# Patient Record
Sex: Male | Born: 1965 | Race: Black or African American | Hispanic: No | Marital: Single | Smoking: Current every day smoker
Health system: Southern US, Community
[De-identification: ages and names within clinical notes are randomized; demographics above are authoritative.]

## PROBLEM LIST (undated history)

## (undated) DIAGNOSIS — K219 Gastro-esophageal reflux disease without esophagitis: Secondary | ICD-10-CM

## (undated) DIAGNOSIS — M545 Low back pain, unspecified: Secondary | ICD-10-CM

## (undated) DIAGNOSIS — F17200 Nicotine dependence, unspecified, uncomplicated: Secondary | ICD-10-CM

## (undated) DIAGNOSIS — F419 Anxiety disorder, unspecified: Secondary | ICD-10-CM

## (undated) DIAGNOSIS — F1010 Alcohol abuse, uncomplicated: Secondary | ICD-10-CM

## (undated) DIAGNOSIS — F319 Bipolar disorder, unspecified: Secondary | ICD-10-CM

## (undated) DIAGNOSIS — Z9289 Personal history of other medical treatment: Secondary | ICD-10-CM

## (undated) DIAGNOSIS — E785 Hyperlipidemia, unspecified: Secondary | ICD-10-CM

## (undated) DIAGNOSIS — I739 Peripheral vascular disease, unspecified: Secondary | ICD-10-CM

## (undated) DIAGNOSIS — F329 Major depressive disorder, single episode, unspecified: Secondary | ICD-10-CM

## (undated) DIAGNOSIS — G8929 Other chronic pain: Secondary | ICD-10-CM

## (undated) DIAGNOSIS — E119 Type 2 diabetes mellitus without complications: Secondary | ICD-10-CM

## (undated) DIAGNOSIS — M359 Systemic involvement of connective tissue, unspecified: Secondary | ICD-10-CM

## (undated) DIAGNOSIS — I10 Essential (primary) hypertension: Secondary | ICD-10-CM

## (undated) DIAGNOSIS — F32A Depression, unspecified: Secondary | ICD-10-CM

## (undated) DIAGNOSIS — I639 Cerebral infarction, unspecified: Secondary | ICD-10-CM

## (undated) HISTORY — DX: Peripheral vascular disease, unspecified: I73.9

## (undated) HISTORY — DX: Hyperlipidemia, unspecified: E78.5

## (undated) HISTORY — PX: TONSILLECTOMY: SUR1361

---
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
## 2011-08-11 DIAGNOSIS — I639 Cerebral infarction, unspecified: Secondary | ICD-10-CM

## 2011-08-11 HISTORY — DX: Cerebral infarction, unspecified: I63.9

## 2011-08-15 ENCOUNTER — Other Ambulatory Visit (HOSPITAL_COMMUNITY): Payer: Self-pay | Admitting: Internal Medicine

## 2011-08-15 ENCOUNTER — Ambulatory Visit (HOSPITAL_COMMUNITY)
Admission: RE | Admit: 2011-08-15 | Discharge: 2011-08-15 | Disposition: A | Discharge status: Home / Self Care | Payer: Self-pay | Source: Ambulatory Visit | Attending: Internal Medicine | Admitting: Internal Medicine

## 2011-08-15 ENCOUNTER — Encounter (HOSPITAL_COMMUNITY): Payer: Self-pay

## 2011-08-15 DIAGNOSIS — I7771 Dissection of carotid artery: Secondary | ICD-10-CM

## 2011-08-15 DIAGNOSIS — R9409 Abnormal results of other function studies of central nervous system: Secondary | ICD-10-CM | POA: Insufficient documentation

## 2011-08-15 DIAGNOSIS — R4701 Aphasia: Secondary | ICD-10-CM | POA: Insufficient documentation

## 2011-08-15 DIAGNOSIS — I10 Essential (primary) hypertension: Secondary | ICD-10-CM | POA: Insufficient documentation

## 2011-08-15 DIAGNOSIS — R29810 Facial weakness: Secondary | ICD-10-CM | POA: Insufficient documentation

## 2011-08-15 DIAGNOSIS — Z8673 Personal history of transient ischemic attack (TIA), and cerebral infarction without residual deficits: Secondary | ICD-10-CM | POA: Insufficient documentation

## 2011-08-15 DIAGNOSIS — R29898 Other symptoms and signs involving the musculoskeletal system: Secondary | ICD-10-CM | POA: Insufficient documentation

## 2011-08-15 DIAGNOSIS — I708 Atherosclerosis of other arteries: Secondary | ICD-10-CM | POA: Insufficient documentation

## 2011-08-15 HISTORY — DX: Essential (primary) hypertension: I10

## 2011-08-15 HISTORY — DX: Cerebral infarction, unspecified: I63.9

## 2011-08-15 MED ORDER — HYDRALAZINE HCL 20 MG/ML IJ SOLN
5.0000 mg | Freq: Once | INTRAMUSCULAR | Status: AC
  Administered 2011-08-15: 5 mg via INTRAVENOUS

## 2011-08-15 MED ORDER — FENTANYL CITRATE 0.05 MG/ML IJ SOLN
INTRAMUSCULAR | Status: AC
  Filled 2011-08-15: qty 4

## 2011-08-15 MED ORDER — FENTANYL CITRATE 0.05 MG/ML IJ SOLN
INTRAMUSCULAR | Status: AC | PRN
  Administered 2011-08-15 (×2): 25 ug via INTRAVENOUS

## 2011-08-15 MED ORDER — MIDAZOLAM HCL 2 MG/2ML IJ SOLN
INTRAMUSCULAR | Status: AC
  Filled 2011-08-15: qty 4

## 2011-08-15 MED ORDER — HYDRALAZINE HCL 20 MG/ML IJ SOLN: 5.0000 mg | Freq: Once | INTRAMUSCULAR | Status: DC

## 2011-08-15 MED ORDER — MIDAZOLAM HCL 5 MG/5ML IJ SOLN
INTRAMUSCULAR | Status: AC | PRN
  Administered 2011-08-15: 1 mg via INTRAVENOUS

## 2011-08-15 MED ORDER — HEPARIN SOD (PORK) LOCK FLUSH 100 UNIT/ML IV SOLN
INTRAVENOUS | Status: AC | PRN
  Administered 2011-08-15 (×2): 500 [IU] via INTRAVENOUS

## 2011-08-15 MED ORDER — IOHEXOL 300 MG/ML  SOLN
150.0000 mL | Freq: Once | INTRAMUSCULAR | Status: AC | PRN
  Administered 2011-08-15: 25 mL via INTRA_ARTERIAL

## 2011-08-15 MED ORDER — FLUMAZENIL 0.5 MG/5ML IV SOLN
INTRAVENOUS | Status: AC
  Filled 2011-08-15: qty 5

## 2011-08-15 MED ORDER — HYDRALAZINE HCL 20 MG/ML IJ SOLN
INTRAMUSCULAR | Status: AC | PRN
  Administered 2011-08-15 (×3): 5 mg via INTRAVENOUS

## 2011-08-15 MED ORDER — HYDRALAZINE HCL 20 MG/ML IJ SOLN
INTRAMUSCULAR | Status: AC
  Filled 2011-08-15: qty 1

## 2011-08-15 NOTE — H&P (Signed)
[name] [name] is an 46 y.o. male.   Chief Complaint: Rt arm weakness; rt facial droop; abn MR  HPI: scheduled for Cerebral Arteriogram  Past Medical History  Diagnosis Date  . Hypertension   . Stroke     No past surgical history on file.  No family history on file. Social History:  does not have a smoking history on file. He does not have any smokeless tobacco history on file. His alcohol and drug histories not on file.  Allergies: No Known Allergies  No current outpatient prescriptions on file as of 08/15/2011.   No current facility-administered medications on file as of 08/15/2011.    No results found for this or any previous visit (from the past 48 hour(s)). No results found.  Review of Systems  Constitutional: Negative for fever.  Respiratory: Negative for cough.   Cardiovascular: Negative for chest pain.  Gastrointestinal: Negative for nausea, vomiting and abdominal pain.  Neurological: Positive for focal weakness. Negative for headaches.       Rt arm weakness; rt facial droop    Blood pressure 160/75, pulse 74, height 6\' 2"  (1.88 m), weight 220 lb (99.791 kg), SpO2 99.00%. Physical Exam  Constitutional: He is oriented to person, place, and time. He appears well-developed and well-nourished.  HENT:  Head: Normocephalic.  Eyes: EOM are normal.       Rt facial droop  Neck: Normal range of motion.  Cardiovascular: Normal rate, regular rhythm and normal heart sounds.   No murmur heard. Respiratory: Effort normal. He has no wheezes.  GI: Soft. Bowel sounds are normal. There is no tenderness.  Musculoskeletal: Normal range of motion.  Neurological: He is alert and oriented to person, place, and time. No cranial nerve deficit. Coordination abnormal.       Rt arm weakness  Skin: Skin is warm and dry.     Assessment/Plan Rt arm weak; facial droop; abn MR Scheduled now for cerebral arteriogram Pt aware of procedure benefits and risks and agreeable to proceed. Consent  signed.  Marrissa Dai A 08/15/2011, 11:19 AM

## 2011-08-15 NOTE — ED Notes (Signed)
Patients Groin checked q50min during recovery period.

## 2011-08-18 ENCOUNTER — Other Ambulatory Visit (HOSPITAL_COMMUNITY): Payer: Self-pay | Admitting: Internal Medicine

## 2011-08-18 DIAGNOSIS — I7771 Dissection of carotid artery: Secondary | ICD-10-CM

## 2011-08-19 ENCOUNTER — Other Ambulatory Visit (HOSPITAL_COMMUNITY): Payer: Self-pay | Admitting: Internal Medicine

## 2011-08-19 ENCOUNTER — Inpatient Hospital Stay (HOSPITAL_COMMUNITY): Admission: AD | Admit: 2011-08-19 | Payer: Self-pay | Source: Ambulatory Visit | Admitting: Vascular Surgery

## 2011-08-19 ENCOUNTER — Encounter (HOSPITAL_COMMUNITY): Payer: Self-pay

## 2011-08-19 ENCOUNTER — Inpatient Hospital Stay (HOSPITAL_COMMUNITY)
Admission: RE | Admit: 2011-08-19 | Discharge: 2011-08-23 | DRG: 038 | Disposition: A | Discharge status: Home / Self Care | Payer: Self-pay | Source: Ambulatory Visit | Attending: Vascular Surgery | Admitting: Vascular Surgery

## 2011-08-19 ENCOUNTER — Telehealth (HOSPITAL_COMMUNITY): Payer: Self-pay

## 2011-08-19 ENCOUNTER — Encounter (HOSPITAL_COMMUNITY): Payer: Self-pay | Admitting: Pharmacy Technician

## 2011-08-19 ENCOUNTER — Ambulatory Visit (HOSPITAL_COMMUNITY): Payer: Self-pay

## 2011-08-19 DIAGNOSIS — I7771 Dissection of carotid artery: Secondary | ICD-10-CM

## 2011-08-19 DIAGNOSIS — I10 Essential (primary) hypertension: Secondary | ICD-10-CM | POA: Diagnosis present

## 2011-08-19 DIAGNOSIS — R4701 Aphasia: Secondary | ICD-10-CM | POA: Diagnosis present

## 2011-08-19 DIAGNOSIS — Z7982 Long term (current) use of aspirin: Secondary | ICD-10-CM

## 2011-08-19 DIAGNOSIS — K219 Gastro-esophageal reflux disease without esophagitis: Secondary | ICD-10-CM | POA: Diagnosis present

## 2011-08-19 DIAGNOSIS — I6529 Occlusion and stenosis of unspecified carotid artery: Secondary | ICD-10-CM

## 2011-08-19 DIAGNOSIS — I7409 Other arterial embolism and thrombosis of abdominal aorta: Secondary | ICD-10-CM | POA: Diagnosis present

## 2011-08-19 DIAGNOSIS — Z8673 Personal history of transient ischemic attack (TIA), and cerebral infarction without residual deficits: Secondary | ICD-10-CM

## 2011-08-19 DIAGNOSIS — Z79899 Other long term (current) drug therapy: Secondary | ICD-10-CM

## 2011-08-19 DIAGNOSIS — F17200 Nicotine dependence, unspecified, uncomplicated: Secondary | ICD-10-CM | POA: Diagnosis present

## 2011-08-19 DIAGNOSIS — Z23 Encounter for immunization: Secondary | ICD-10-CM

## 2011-08-19 DIAGNOSIS — F1010 Alcohol abuse, uncomplicated: Secondary | ICD-10-CM | POA: Diagnosis present

## 2011-08-19 DIAGNOSIS — F411 Generalized anxiety disorder: Secondary | ICD-10-CM | POA: Diagnosis present

## 2011-08-19 HISTORY — DX: Gastro-esophageal reflux disease without esophagitis: K21.9

## 2011-08-19 HISTORY — DX: Anxiety disorder, unspecified: F41.9

## 2011-08-19 HISTORY — DX: Nicotine dependence, unspecified, uncomplicated: F17.200

## 2011-08-19 HISTORY — DX: Alcohol abuse, uncomplicated: F10.10

## 2011-08-19 LAB — COMPREHENSIVE METABOLIC PANEL
ALT: 89 U/L — ABNORMAL HIGH (ref 0–53)
Albumin: 3.8 g/dL (ref 3.5–5.2)
Calcium: 9.4 mg/dL (ref 8.4–10.5)
GFR calc Af Amer: >90 mL/min (ref >90)
Glucose, Bld: 160 mg/dL — ABNORMAL HIGH (ref 70–99)
Sodium: 137 mEq/L (ref 135–145)
Total Protein: 7.5 g/dL (ref 6.0–8.3)

## 2011-08-19 LAB — URINALYSIS, ROUTINE W REFLEX MICROSCOPIC
Bilirubin Urine: NEGATIVE
Ketones, ur: NEGATIVE mg/dL
Leukocytes, UA: NEGATIVE
Nitrite: NEGATIVE
Specific Gravity, Urine: 1.01 (ref 1.005–1.030)
Urobilinogen, UA: 0.2 mg/dL (ref 0.0–1.0)

## 2011-08-19 LAB — CBC
Hemoglobin: 14.4 g/dL (ref 13.0–17.0)
MCH: 29.1 pg (ref 26.0–34.0)
MCHC: 33.5 g/dL (ref 30.0–36.0)
RDW: 13.8 % (ref 11.5–15.5)

## 2011-08-19 LAB — PROTIME-INR
INR: 0.99 (ref 0.00–1.49)
Prothrombin Time: 13.3 seconds (ref 11.6–15.2)

## 2011-08-19 LAB — AMYLASE: Amylase: 51 U/L (ref 0–105)

## 2011-08-19 MED ORDER — HEPARIN SOD (PORCINE) IN D5W 100 UNIT/ML IV SOLN
1500.0000 [IU]/h | INTRAVENOUS | Status: DC
  Administered 2011-08-19: 1500 [IU]/h via INTRAVENOUS
  Filled 2011-08-19 (×3): qty 250

## 2011-08-19 MED ORDER — FENTANYL CITRATE 0.05 MG/ML IJ SOLN
INTRAMUSCULAR | Status: DC | PRN
  Administered 2011-08-19 (×4): 25 ug via INTRAVENOUS

## 2011-08-19 MED ORDER — LABETALOL HCL 5 MG/ML IV SOLN
10.0000 mg | INTRAVENOUS | Status: DC | PRN
  Administered 2011-08-20: 10 mg via INTRAVENOUS
  Filled 2011-08-19: qty 4

## 2011-08-19 MED ORDER — HYDRALAZINE HCL 20 MG/ML IJ SOLN
INTRAMUSCULAR | Status: AC
  Filled 2011-08-19: qty 1

## 2011-08-19 MED ORDER — GUAIFENESIN-DM 100-10 MG/5ML PO SYRP: 15.0000 mL | ORAL_SOLUTION | ORAL | Status: DC | PRN

## 2011-08-19 MED ORDER — METOPROLOL TARTRATE 1 MG/ML IV SOLN: 2.0000 mg | INTRAVENOUS | Status: DC | PRN

## 2011-08-19 MED ORDER — MIDAZOLAM HCL 2 MG/2ML IJ SOLN
INTRAMUSCULAR | Status: AC
  Filled 2011-08-19: qty 6

## 2011-08-19 MED ORDER — MIDAZOLAM HCL 5 MG/5ML IJ SOLN
INTRAMUSCULAR | Status: DC | PRN
  Administered 2011-08-19 (×2): 1 mg via INTRAVENOUS

## 2011-08-19 MED ORDER — HYDRALAZINE HCL 20 MG/ML IJ SOLN
10.0000 mg | INTRAMUSCULAR | Status: DC | PRN
  Filled 2011-08-19: qty 0.5

## 2011-08-19 MED ORDER — HEPARIN SOD (PORK) LOCK FLUSH 100 UNIT/ML IV SOLN
500.0000 [IU] | Freq: Once | INTRAVENOUS | Status: AC
  Administered 2011-08-19: 500 [IU] via INTRAVENOUS

## 2011-08-19 MED ORDER — TEMAZEPAM 15 MG PO CAPS
15.0000 mg | ORAL_CAPSULE | Freq: Every evening | ORAL | Status: DC | PRN
  Administered 2011-08-19: 15 mg via ORAL
  Filled 2011-08-19: qty 1

## 2011-08-19 MED ORDER — ACETAMINOPHEN 325 MG PO TABS: 325.0000 mg | ORAL_TABLET | ORAL | Status: DC | PRN

## 2011-08-19 MED ORDER — FAMOTIDINE IN NACL 20-0.9 MG/50ML-% IV SOLN
20.0000 mg | Freq: Two times a day (BID) | INTRAVENOUS | Status: DC
  Administered 2011-08-19 – 2011-08-20 (×2): 20 mg via INTRAVENOUS
  Filled 2011-08-19 (×3): qty 50

## 2011-08-19 MED ORDER — DEXTROSE 5 % IV SOLN
1.5000 g | INTRAVENOUS | Status: AC
  Administered 2011-08-20: 1.5 g via INTRAVENOUS
  Filled 2011-08-19: qty 1.5

## 2011-08-19 MED ORDER — DOCUSATE SODIUM 100 MG PO CAPS
100.0000 mg | ORAL_CAPSULE | Freq: Two times a day (BID) | ORAL | Status: DC
  Filled 2011-08-19 (×2): qty 1

## 2011-08-19 MED ORDER — IOHEXOL 300 MG/ML  SOLN
250.0000 mL | Freq: Once | INTRAMUSCULAR | Status: AC | PRN
  Administered 2011-08-19: 120 mL via INTRA_ARTERIAL

## 2011-08-19 MED ORDER — SENNOSIDES-DOCUSATE SODIUM 8.6-50 MG PO TABS
1.0000 | ORAL_TABLET | Freq: Every evening | ORAL | Status: DC | PRN
  Filled 2011-08-19: qty 1

## 2011-08-19 MED ORDER — INFLUENZA VIRUS VACC SPLIT PF IM SUSP
0.5000 mL | INTRAMUSCULAR | Status: AC
Start: 2011-08-20 — End: 2011-08-21
  Filled 2011-08-19: qty 0.5

## 2011-08-19 MED ORDER — HYDRALAZINE HCL 20 MG/ML IJ SOLN
5.0000 mg | INTRAMUSCULAR | Status: DC
  Administered 2011-08-19: 12:00:00 via INTRAVENOUS
  Administered 2011-08-19: 5 mg via INTRAVENOUS
  Administered 2011-08-19: 12:00:00 via INTRAVENOUS

## 2011-08-19 MED ORDER — POTASSIUM CHLORIDE CRYS ER 20 MEQ PO TBCR: 20.0000 meq | EXTENDED_RELEASE_TABLET | Freq: Once | ORAL | Status: DC

## 2011-08-19 MED ORDER — OXYCODONE HCL 5 MG PO TABS
5.0000 mg | ORAL_TABLET | ORAL | Status: DC | PRN
  Administered 2011-08-19 (×2): 10 mg via ORAL
  Filled 2011-08-19 (×2): qty 2

## 2011-08-19 MED ORDER — MORPHINE SULFATE 2 MG/ML IJ SOLN
2.0000 mg | INTRAMUSCULAR | Status: DC | PRN
  Administered 2011-08-19: 2 mg via INTRAVENOUS
  Filled 2011-08-19 (×2): qty 1

## 2011-08-19 MED ORDER — ACETAMINOPHEN 650 MG RE SUPP: 325.0000 mg | RECTAL | Status: DC | PRN

## 2011-08-19 MED ORDER — PHENOL 1.4 % MT LIQD
1.0000 | OROMUCOSAL | Status: DC | PRN
  Filled 2011-08-19: qty 177

## 2011-08-19 MED ORDER — ONDANSETRON HCL 4 MG/2ML IJ SOLN: 4.0000 mg | Freq: Four times a day (QID) | INTRAMUSCULAR | Status: DC | PRN

## 2011-08-19 MED ORDER — SENNA 8.6 MG PO TABS
1.0000 | ORAL_TABLET | Freq: Two times a day (BID) | ORAL | Status: DC
  Filled 2011-08-19 (×3): qty 1

## 2011-08-19 MED ORDER — CEFAZOLIN SODIUM 1-5 GM-% IV SOLN
INTRAVENOUS | Status: AC
  Filled 2011-08-19: qty 50

## 2011-08-19 MED ORDER — FENTANYL CITRATE 0.05 MG/ML IJ SOLN
INTRAMUSCULAR | Status: AC
  Filled 2011-08-19: qty 6

## 2011-08-19 MED ORDER — SODIUM CHLORIDE 0.9 % IV SOLN: INTRAVENOUS | Status: AC

## 2011-08-19 MED ORDER — HYDRALAZINE HCL 20 MG/ML IJ SOLN
INTRAMUSCULAR | Status: AC
  Administered 2011-08-19: 5 mg via INTRAVENOUS
  Filled 2011-08-19: qty 1

## 2011-08-19 NOTE — ED Notes (Signed)
Transferred to radiology nurses station for evaluation for 3 hrs prior to return transport to [facility].  Pt & family informed of above

## 2011-08-19 NOTE — Procedures (Signed)
S/P 4 vessel cerebral arteriogram, and abdominal aortogram Rt CFAa pproach Preliminary findings  1. 95 % lt ICA prox stenosis. 2.70 % RT VA origin stenosis. 3. 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

## 2011-08-19 NOTE — Plan of Care (Signed)
Problem: Consults Goal: Diagnosis CEA/CES/AAA Stent Outcome: Completed/Met Date Met:  08/19/11 CEA
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
## 2011-08-19 NOTE — ED Notes (Signed)
Family updated as to patient's status per MD 

## 2011-08-19 NOTE — H&P (Signed)
[name] [name] is an 46 y.o. male.   Chief Complaint: CVA 2/11; rt facial droop; slurred speech; rt arm weak resolving HPI: Scheduled for cerebral arteriogram today in i[place]  Past Medical History  Diagnosis Date  . Hypertension   . Stroke     No past surgical history on file.  No family history on file. Social History:  does not have a smoking history on file. He does not have any smokeless tobacco history on file. His alcohol and drug histories not on file.  Allergies: No Known Allergies  Medications Prior to Admission  Medication Sig Dispense Refill  . Azilsartan Medoxomil (EDARBI) 80 MG TABS Take by mouth once. Has not taken any home medications since hospitalized at [facility]      . loratadine (CLARITIN) 10 MG tablet Take 10 mg by mouth daily.       No current facility-administered medications on file as of 08/19/2011.    No results found for this or any previous visit (from the past 48 hour(s)). No results found.  Review of Systems  Constitutional: Negative for fever.  Eyes: Negative for blurred vision.  Respiratory: Negative for cough.   Cardiovascular: Negative for chest pain.  Gastrointestinal: Negative for nausea, vomiting and abdominal pain.  Neurological: Positive for tingling, speech change and focal weakness. Negative for headaches.       Rt sided sxs    Blood pressure 149/84, pulse 68, temperature 97.8 F (36.6 C), resp. rate 20, SpO2 98.00%. Physical Exam  Constitutional: He is oriented to person, place, and time. He appears well-developed and well-nourished.  HENT:  Head: Normocephalic.  Eyes: EOM are normal.  Neck: Normal range of motion.  Cardiovascular: Normal rate, regular rhythm and normal heart sounds.   No murmur heard. Respiratory: Effort normal and breath sounds normal. He has no wheezes.  GI: Soft. Bowel sounds are normal. There is no tenderness.  Musculoskeletal: Normal range of motion.  Neurological: He is alert and oriented to person, place,  and time. Coordination abnormal.  Skin: Skin is warm and dry.     Assessment/Plan CVA 2/11; rt sided weakness; facial droop; abn MRI Scheduled for cerebral arteriogram Aware of procedure benefits and risks and agreeable to proceed. Consent signed.  [name] [name] A 08/19/2011, 10:25 AM

## 2011-08-19 NOTE — ED Notes (Signed)
Pt rec'd from [place] [facility] with 22 g angiocath in left hand.  Flushed with ease and denies discomfort.

## 2011-08-19 NOTE — Consult Note (Signed)
Pt with left hemispheric stroke approximately one week ago.  Initially had aphasia, with right upper and lower extremity weakness.  These have all improved somewhat over the last few days.  He still has right hand clumsiness and some slurred speech.   No prior stroke.  Has been on aspirin several days.  Agram today shows high grade LICA stenosis as well as occluded infrarenal aorta.  No dissection.  Risk factors include HTN, tobacco abuse.  The patient has no claudication symptoms despite aortic occlusion.  Head CT at [facility] showed parietal infarct with no hemorrhage.  Other history details as above.  Neuro exam: LUE LLE 5/5 motor, RLE 5/5 motor, R hand wrist 4-5 over 5, Right upper arm 2-3/5 Absent femoral and pedal pulses  Otherwise exam as above.   Assessment: high grade Left ICA stenosis symptomatic.  No significant right ICA stenosis    .I discussed with the patient the risks of carotid endarterectomy including but not limited to myocardial events 5%, cranial nerve injury 10-15%, stroke 1-2%, bleeding or infection 1%.  I also discussed the possibility of ischemic penumbra post CEA as well as risk of intracranial hemorrhage.  I also discussed the benefits of long term stroke prevention and the advantage compared to medical therapy The patient is aware of the risks and agrees to proceed forward with the procedure.  As far as his aortic occlusion is concerned this is currently asymptomatic and can be managed expectantly for now.  Left CEA in AM Heparin drip overnight.  Fabienne Bruns, MD [facility] [place] Office: [phone] Pager: [phone]

## 2011-08-19 NOTE — Progress Notes (Signed)
ANTICOAGULATION CONSULT NOTE - Initial Consult  Pharmacy Consult for Heparin Indication: carotid stenosis  No Known Allergies  Patient Measurements:   Heparin Dosing Weight: 218LBS total weight  Vital Signs: Temp: 97.8 F (36.6 C) (02/19 1000) BP: 163/83 mmHg (02/19 1620) Pulse Rate: 67  (02/19 1620)  Labs: No results found for this basename: HGB:2,HCT:3,PLT:3,APTT:3,LABPROT:3,INR:3,HEPARINUNFRC:3,CREATININE:3,CKTOTAL:3,CKMB:3,TROPONINI:3 in the last 72 hours CrCl is unknown because no creatinine reading has been taken.  Medical History: Past Medical History  Diagnosis Date  . Hypertension   . Stroke   . GERD (gastroesophageal reflux disease)   . Nicotine addiction   . ETOH abuse     Medications:  Prescriptions prior to admission  Medication Sig Dispense Refill  . loratadine (CLARITIN) 10 MG tablet Take 10 mg by mouth daily.      . Azilsartan Medoxomil (EDARBI) 80 MG TABS Take by mouth once. Has not taken any home medications since hospitalized at [facility]        Assessment: 46 yo man s/p CVA and Cerebral arteriogram to start heparin for carotid stenosis.  Plan L CEA tomorrow Goal of Therapy:  Heparin level 0.3-0.5 units/ml   Plan:  Heparin to start without bolus at 1500 Units/hr.  Will check heparin level and CBC 8 hours after start.  Talbert Cage Poteet 08/19/2011,4:34 PM

## 2011-08-19 NOTE — ED Notes (Signed)
MD at bedside. 

## 2011-08-19 NOTE — ED Notes (Signed)
Radial and brachial pulses to left are 3+, easily palpated.

## 2011-08-19 NOTE — Progress Notes (Signed)
Pt requested medication for anxiety. Called Dr. Darrick Penna he said no additional meds at this time other than what is already ordered for sleep. Otilio Connors RN

## 2011-08-19 NOTE — ED Notes (Signed)
Pt voided 350 ML clear yellow urine

## 2011-08-20 ENCOUNTER — Encounter (HOSPITAL_COMMUNITY): Payer: Self-pay | Admitting: Anesthesiology

## 2011-08-20 ENCOUNTER — Other Ambulatory Visit: Payer: Self-pay

## 2011-08-20 ENCOUNTER — Other Ambulatory Visit: Payer: Self-pay | Admitting: Vascular Surgery

## 2011-08-20 ENCOUNTER — Encounter (HOSPITAL_COMMUNITY): Payer: Self-pay

## 2011-08-20 ENCOUNTER — Telehealth (HOSPITAL_COMMUNITY): Payer: Self-pay | Admitting: Interventional Radiology

## 2011-08-20 ENCOUNTER — Encounter (HOSPITAL_COMMUNITY)
Admission: RE | Disposition: A | Discharge status: Home / Self Care | Payer: Self-pay | Source: Ambulatory Visit | Attending: Vascular Surgery

## 2011-08-20 ENCOUNTER — Ambulatory Visit (HOSPITAL_COMMUNITY): Payer: Self-pay | Admitting: Anesthesiology

## 2011-08-20 HISTORY — PX: ENDARTERECTOMY: SHX5162

## 2011-08-20 LAB — CBC
HCT: 35.8 % — ABNORMAL LOW (ref 39.0–52.0)
Hemoglobin: 11.7 g/dL — ABNORMAL LOW (ref 13.0–17.0)
MCH: 28.5 pg (ref 26.0–34.0)
MCV: 87.1 fL (ref 78.0–100.0)
RBC: 4.11 MIL/uL — ABNORMAL LOW (ref 4.22–5.81)

## 2011-08-20 LAB — SURGICAL PCR SCREEN: Staphylococcus aureus: NEGATIVE

## 2011-08-20 SURGERY — ENDARTERECTOMY, CAROTID
Anesthesia: General | Site: Neck | Laterality: Left | Wound class: Clean

## 2011-08-20 MED ORDER — MORPHINE SULFATE 2 MG/ML IJ SOLN
2.0000 mg | INTRAMUSCULAR | Status: DC | PRN
  Administered 2011-08-20: 4 mg via INTRAVENOUS
  Administered 2011-08-20 (×2): 2 mg via INTRAVENOUS
  Administered 2011-08-20 – 2011-08-21 (×2): 4 mg via INTRAVENOUS
  Filled 2011-08-20 (×3): qty 2
  Filled 2011-08-20 (×2): qty 1

## 2011-08-20 MED ORDER — OXYCODONE HCL 5 MG PO TABS: 5.0000 mg | ORAL_TABLET | ORAL | Status: DC | PRN

## 2011-08-20 MED ORDER — 0.9 % SODIUM CHLORIDE (POUR BTL) OPTIME
TOPICAL | Status: DC | PRN
  Administered 2011-08-20: 1000 mL

## 2011-08-20 MED ORDER — NITROGLYCERIN IN D5W 200-5 MCG/ML-% IV SOLN
INTRAVENOUS | Status: DC | PRN
  Administered 2011-08-20: 25 ug/min via INTRAVENOUS

## 2011-08-20 MED ORDER — POTASSIUM CHLORIDE CRYS ER 20 MEQ PO TBCR: 20.0000 meq | EXTENDED_RELEASE_TABLET | Freq: Once | ORAL | Status: AC | PRN

## 2011-08-20 MED ORDER — ONDANSETRON HCL 4 MG/2ML IJ SOLN: 4.0000 mg | Freq: Four times a day (QID) | INTRAMUSCULAR | Status: DC | PRN

## 2011-08-20 MED ORDER — HEPARIN SODIUM (PORCINE) 5000 UNIT/ML IJ SOLN
5000.0000 [IU] | Freq: Three times a day (TID) | INTRAMUSCULAR | Status: DC
  Administered 2011-08-21 – 2011-08-23 (×6): 5000 [IU] via SUBCUTANEOUS
  Filled 2011-08-20 (×11): qty 1

## 2011-08-20 MED ORDER — LACTATED RINGERS IV SOLN
INTRAVENOUS | Status: DC | PRN
  Administered 2011-08-20 (×2): via INTRAVENOUS

## 2011-08-20 MED ORDER — POTASSIUM CHLORIDE IN NACL 20-0.9 MEQ/L-% IV SOLN
INTRAVENOUS | Status: DC
  Administered 2011-08-20: 19:00:00 via INTRAVENOUS
  Filled 2011-08-20 (×3): qty 1000

## 2011-08-20 MED ORDER — GLYCOPYRROLATE 0.2 MG/ML IJ SOLN
INTRAMUSCULAR | Status: DC | PRN
  Administered 2011-08-20: 0.2 mg via INTRAVENOUS
  Administered 2011-08-20: .6 mg via INTRAVENOUS

## 2011-08-20 MED ORDER — LABETALOL HCL 5 MG/ML IV SOLN
10.0000 mg | INTRAVENOUS | Status: DC | PRN
  Administered 2011-08-20 – 2011-08-22 (×4): 10 mg via INTRAVENOUS
  Filled 2011-08-20: qty 4

## 2011-08-20 MED ORDER — ONDANSETRON HCL 4 MG/2ML IJ SOLN: 4.0000 mg | Freq: Once | INTRAMUSCULAR | Status: DC | PRN

## 2011-08-20 MED ORDER — DOPAMINE-DEXTROSE 3.2-5 MG/ML-% IV SOLN: 3.0000 ug/kg/min | INTRAVENOUS | Status: DC

## 2011-08-20 MED ORDER — ASPIRIN EC 325 MG PO TBEC
325.0000 mg | DELAYED_RELEASE_TABLET | Freq: Every day | ORAL | Status: DC
  Administered 2011-08-20 – 2011-08-23 (×4): 325 mg via ORAL
  Filled 2011-08-20 (×4): qty 1

## 2011-08-20 MED ORDER — HYDRALAZINE HCL 20 MG/ML IJ SOLN
10.0000 mg | INTRAMUSCULAR | Status: DC | PRN
  Administered 2011-08-21: 15:00:00 via INTRAVENOUS
  Filled 2011-08-20 (×2): qty 0.5

## 2011-08-20 MED ORDER — ARTIFICIAL TEARS OP OINT
TOPICAL_OINTMENT | OPHTHALMIC | Status: DC | PRN
  Administered 2011-08-20: 1 via OPHTHALMIC

## 2011-08-20 MED ORDER — FAMOTIDINE IN NACL 20-0.9 MG/50ML-% IV SOLN
20.0000 mg | Freq: Two times a day (BID) | INTRAVENOUS | Status: DC
  Administered 2011-08-20 – 2011-08-21 (×2): 20 mg via INTRAVENOUS
  Filled 2011-08-20 (×4): qty 50

## 2011-08-20 MED ORDER — SUCCINYLCHOLINE CHLORIDE 20 MG/ML IJ SOLN
INTRAMUSCULAR | Status: DC | PRN
  Administered 2011-08-20: 20 mg via INTRAVENOUS

## 2011-08-20 MED ORDER — PHENYLEPHRINE HCL 10 MG/ML IJ SOLN
20.0000 mg | INTRAMUSCULAR | Status: DC | PRN
  Administered 2011-08-20: 20 ug/min via INTRAVENOUS

## 2011-08-20 MED ORDER — FENTANYL CITRATE 0.05 MG/ML IJ SOLN
INTRAMUSCULAR | Status: DC | PRN
  Administered 2011-08-20 (×2): 50 ug via INTRAVENOUS
  Administered 2011-08-20: 150 ug via INTRAVENOUS

## 2011-08-20 MED ORDER — SODIUM CHLORIDE 0.9 % IV SOLN
INTRAVENOUS | Status: DC | PRN
  Administered 2011-08-20: 12:00:00 via INTRAVENOUS

## 2011-08-20 MED ORDER — GUAIFENESIN-DM 100-10 MG/5ML PO SYRP: 15.0000 mL | ORAL_SOLUTION | ORAL | Status: DC | PRN

## 2011-08-20 MED ORDER — LABETALOL HCL 5 MG/ML IV SOLN
INTRAVENOUS | Status: DC | PRN
  Administered 2011-08-20 (×2): 10 mg via INTRAVENOUS

## 2011-08-20 MED ORDER — HYDROMORPHONE HCL PF 1 MG/ML IJ SOLN: 0.2500 mg | INTRAMUSCULAR | Status: DC | PRN

## 2011-08-20 MED ORDER — NEOSTIGMINE METHYLSULFATE 1 MG/ML IJ SOLN
INTRAMUSCULAR | Status: DC | PRN
  Administered 2011-08-20: 4 mg via INTRAVENOUS

## 2011-08-20 MED ORDER — OXYCODONE-ACETAMINOPHEN 5-325 MG PO TABS
1.0000 | ORAL_TABLET | ORAL | Status: DC | PRN
  Administered 2011-08-21 (×2): 2 via ORAL
  Filled 2011-08-20 (×4): qty 2

## 2011-08-20 MED ORDER — NITROPRUSSIDE SODIUM 25 MG/ML IV SOLN: 0.3000 ug/kg/min | INTRAVENOUS | Status: DC

## 2011-08-20 MED ORDER — PHENOL 1.4 % MT LIQD: 1.0000 | OROMUCOSAL | Status: DC | PRN

## 2011-08-20 MED ORDER — DEXTROSE 5 % IV SOLN
1.5000 g | Freq: Two times a day (BID) | INTRAVENOUS | Status: AC
  Administered 2011-08-20 – 2011-08-21 (×2): 1.5 g via INTRAVENOUS
  Filled 2011-08-20 (×3): qty 1.5

## 2011-08-20 MED ORDER — DOCUSATE SODIUM 100 MG PO CAPS
100.0000 mg | ORAL_CAPSULE | Freq: Every day | ORAL | Status: DC
  Administered 2011-08-21 – 2011-08-22 (×2): 100 mg via ORAL
  Filled 2011-08-20 (×3): qty 1

## 2011-08-20 MED ORDER — LORATADINE 10 MG PO TABS
10.0000 mg | ORAL_TABLET | Freq: Every day | ORAL | Status: DC
  Administered 2011-08-20 – 2011-08-22 (×3): 10 mg via ORAL
  Filled 2011-08-20 (×4): qty 1

## 2011-08-20 MED ORDER — SODIUM CHLORIDE 0.9 % IV SOLN: 500.0000 mL | Freq: Once | INTRAVENOUS | Status: AC | PRN

## 2011-08-20 MED ORDER — ONDANSETRON HCL 4 MG/2ML IJ SOLN
INTRAMUSCULAR | Status: DC | PRN
  Administered 2011-08-20: 4 mg via INTRAVENOUS

## 2011-08-20 MED ORDER — PHENYLEPHRINE HCL 10 MG/ML IJ SOLN
10.0000 mg | INTRAVENOUS | Status: DC | PRN
  Administered 2011-08-20: 50 ug/min via INTRAVENOUS

## 2011-08-20 MED ORDER — METOPROLOL TARTRATE 1 MG/ML IV SOLN
2.0000 mg | INTRAVENOUS | Status: DC | PRN
  Administered 2011-08-21: 2 mg via INTRAVENOUS

## 2011-08-20 MED ORDER — ACETAMINOPHEN 325 MG PO TABS
325.0000 mg | ORAL_TABLET | ORAL | Status: DC | PRN
  Administered 2011-08-21 – 2011-08-23 (×6): 650 mg via ORAL
  Filled 2011-08-20 (×6): qty 2

## 2011-08-20 MED ORDER — SODIUM CHLORIDE 0.9 % IR SOLN
Status: DC | PRN
  Administered 2011-08-20: 13:00:00

## 2011-08-20 MED ORDER — ROCURONIUM BROMIDE 100 MG/10ML IV SOLN
INTRAVENOUS | Status: DC | PRN
  Administered 2011-08-20: 20 mg via INTRAVENOUS
  Administered 2011-08-20 (×2): 10 mg via INTRAVENOUS
  Administered 2011-08-20: 50 mg via INTRAVENOUS
  Administered 2011-08-20: 10 mg via INTRAVENOUS

## 2011-08-20 MED ORDER — HEPARIN SODIUM (PORCINE) 1000 UNIT/ML IJ SOLN
INTRAMUSCULAR | Status: DC | PRN
  Administered 2011-08-20: 15000 [IU] via INTRAVENOUS

## 2011-08-20 MED ORDER — LIDOCAINE HCL 4 % MT SOLN
OROMUCOSAL | Status: DC | PRN
  Administered 2011-08-20: 4 mL via TOPICAL

## 2011-08-20 MED ORDER — PROPOFOL 10 MG/ML IV EMUL
INTRAVENOUS | Status: DC | PRN
  Administered 2011-08-20: 200 mg via INTRAVENOUS

## 2011-08-20 MED ORDER — ACETAMINOPHEN 650 MG RE SUPP: 325.0000 mg | RECTAL | Status: DC | PRN

## 2011-08-20 SURGICAL SUPPLY — 48 items
CANISTER SUCTION 2500CC (MISCELLANEOUS) ×2 IMPLANT
CANNULA VESSEL W/WING WO/VALVE (CANNULA) ×2 IMPLANT
CATH ROBINSON RED A/P 18FR (CATHETERS) ×2 IMPLANT
CLIP TI MEDIUM 24 (CLIP) ×2 IMPLANT
CLIP TI WIDE RED SMALL 24 (CLIP) ×2 IMPLANT
CLOTH BEACON ORANGE TIMEOUT ST (SAFETY) ×2 IMPLANT
COVER SURGICAL LIGHT HANDLE (MISCELLANEOUS) ×4 IMPLANT
CRADLE DONUT ADULT HEAD (MISCELLANEOUS) ×2 IMPLANT
DECANTER SPIKE VIAL GLASS SM (MISCELLANEOUS) IMPLANT
DERMABOND ADVANCED (GAUZE/BANDAGES/DRESSINGS) ×1
DERMABOND ADVANCED .7 DNX12 (GAUZE/BANDAGES/DRESSINGS) ×1 IMPLANT
DRAIN HEMOVAC 1/8 X 5 (WOUND CARE) IMPLANT
DRAPE WARM FLUID 44X44 (DRAPE) ×4 IMPLANT
ELECT REM PT RETURN 9FT ADLT (ELECTROSURGICAL) ×2
ELECTRODE REM PT RTRN 9FT ADLT (ELECTROSURGICAL) ×1 IMPLANT
EVACUATOR SILICONE 100CC (DRAIN) IMPLANT
GEL ULTRASOUND 20GR AQUASONIC (MISCELLANEOUS) IMPLANT
GLOVE BIO SURGEON STRL SZ 6.5 (GLOVE) ×2 IMPLANT
GLOVE BIO SURGEON STRL SZ7.5 (GLOVE) ×2 IMPLANT
GLOVE BIOGEL PI IND STRL 7.0 (GLOVE) ×3 IMPLANT
GLOVE BIOGEL PI INDICATOR 7.0 (GLOVE) ×3
GLOVE SURG SS PI 6.5 STRL IVOR (GLOVE) ×4 IMPLANT
GLOVE SURG SS PI 7.5 STRL IVOR (GLOVE) ×4 IMPLANT
GOWN PREVENTION PLUS XLARGE (GOWN DISPOSABLE) ×8 IMPLANT
GOWN STRL NON-REIN LRG LVL3 (GOWN DISPOSABLE) ×4 IMPLANT
KIT BASIN OR (CUSTOM PROCEDURE TRAY) ×2 IMPLANT
KIT ROOM TURNOVER OR (KITS) ×2 IMPLANT
LOOP VESSEL MINI RED (MISCELLANEOUS) IMPLANT
NEEDLE HYPO 25GX1X1/2 BEV (NEEDLE) IMPLANT
NS IRRIG 1000ML POUR BTL (IV SOLUTION) ×4 IMPLANT
PACK CAROTID (CUSTOM PROCEDURE TRAY) ×2 IMPLANT
PAD ARMBOARD 7.5X6 YLW CONV (MISCELLANEOUS) ×4 IMPLANT
PATCH HEMASHIELD 8X75 (Vascular Products) ×2 IMPLANT
SHUNT CAROTID BYPASS 10 (VASCULAR PRODUCTS) ×2 IMPLANT
SHUNT CAROTID BYPASS 12FRX15.5 (VASCULAR PRODUCTS) IMPLANT
SPECIMEN JAR SMALL (MISCELLANEOUS) ×2 IMPLANT
SPONGE SURGIFOAM ABS GEL 100 (HEMOSTASIS) IMPLANT
SUT ETHILON 3 0 PS 1 (SUTURE) IMPLANT
SUT PROLENE 6 0 CC (SUTURE) ×2 IMPLANT
SUT PROLENE 7 0 BV 1 (SUTURE) ×6 IMPLANT
SUT VIC AB 3-0 SH 27 (SUTURE) ×1
SUT VIC AB 3-0 SH 27X BRD (SUTURE) ×1 IMPLANT
SUT VICRYL 4-0 PS2 18IN ABS (SUTURE) ×2 IMPLANT
SYR CONTROL 10ML LL (SYRINGE) IMPLANT
TOWEL OR 17X24 6PK STRL BLUE (TOWEL DISPOSABLE) ×2 IMPLANT
TOWEL OR 17X26 10 PK STRL BLUE (TOWEL DISPOSABLE) ×2 IMPLANT
TRAY FOLEY CATH 14FRSI W/METER (CATHETERS) ×2 IMPLANT
WATER STERILE IRR 1000ML POUR (IV SOLUTION) ×2 IMPLANT

## 2011-08-20 NOTE — Progress Notes (Signed)
Pt received in pacu with slight facial droop to left that was present prior to surgery; tongue midline and moves easily side to side; grips strong and moving lower extremities with equal strength with flexion and extension of feet.

## 2011-08-20 NOTE — Transfer of Care (Signed)
Immediate Anesthesia Transfer of Care Note  Patient: [name] [name]  Procedure(s) Performed: Procedure(s) (LRB): ENDARTERECTOMY CAROTID (Left)  Patient Location: PACU  Anesthesia Type: General  Level of Consciousness: awake, alert , oriented and patient cooperative  Airway & Oxygen Therapy: Patient Spontanous Breathing and Patient connected to nasal cannula oxygen  Post-op Assessment: Report given to PACU RN, Post -op Vital signs reviewed and stable, Patient moving all extremities X 4 and Patient able to stick tongue midline  Post vital signs: Reviewed and stable  Complications: No apparent anesthesia complications

## 2011-08-20 NOTE — Progress Notes (Signed)
ANTICOAGULATION CONSULT NOTE - Follow Up Consult  Pharmacy Consult for heparin Indication: carotid stenosis  Labs:  Basename 08/20/11 1005 08/20/11 0121 08/19/11 1640  HGB -- 11.7* 14.4  HCT -- 35.8* 43.0  PLT -- 193 220  APTT -- -- --  LABPROT -- -- 13.3  INR -- -- 0.99  HEPARINUNFRC 0.16* 0.42 --  CREATININE -- -- 0.85  CKTOTAL -- -- --  CKMB -- -- --  TROPONINI -- -- --   Assessment/Plan: 46yo male on heparin for  L ICA carotid stenosis. Heparin level dropped to 0.16 on  1500 units/hr. Level below 0.3 - 5 goal.  Drip was not stopped per RN.  He is now having CEA.  I do not anticipate heparin will be resumed post-op but will f/u with plans. Len Childs T PharmD  08/20/2011 10:58 AM Pager: [phone]

## 2011-08-20 NOTE — Op Note (Signed)
Procedure Left carotid endarterectomy  Preoperative diagnosis: High-grade symptomatic left internal carotid artery stenosis  Postoperative diagnosis: Same  Anesthesia General  Asst.: Della Goo [facility]  Operative findings: #1 greater than 90% left internal carotid artery stenosis  #2 Dacron patch #4 high bifurcation with approximately 1 cm of patch above the hypoglossal nerve  Operative details: After obtaining informed consent, the patient was taken to the operating room. The patient was placed in a supine position on the operating room table. After induction of general anesthesia and endotracheal intubation a Foley catheter was placed. Next the patient's entire left neck and chest were prepped and draped in usual sterile fashion. An oblique incision was made on the left aspect of the patient's neck anterior to the border the left sternocleidomastoid muscle. The incision was carried down into the subcutaneous tissues and through the platysma. The sternocleidomastoid muscle was identified and reflected laterally. The omohyoid muscle was identified and divided with cautery. The common carotid artery was then found at the base of the incision this was dissected free circumferentially. The vagus nerve was identified and protected. Dissection was then carried up to the level carotid bifurcation. The common facial vein was identified and ligated between silk ties.  The internal jugular vein was then retracted laterally to expose the carotid bifurcation.  It was apparent at this point the the patient had a high carotid bifurcaton. The Ansa cervicalis was identified and traced up to its insertion into the hypoglossal nerve.  The ansa was divided at this level to assist with exposure.  The hyperglossal nerve was identified and protected. In order to expose the distal internal carotid artery above the palpable disease I had to do a significant amount of retraction on the hypoglossal nerve.  I also divided the  superior thyroid branch of the external carotid artery to allow the carotid to rotate over as the external carotid overlapped the internal in essentially an anterior to posterior direction as the bifurcation was rotated.  The internal carotid artery was dissected free circumferentially above the level of the hypoglossal nerve and it was soft in character at this location and above any palpable disease. A vessel loop was placed around the internal carotid artery. Next the external carotid was dissected free circumferentially and vessel loops were placed around these. The patient was given 15000 units of intravenous heparin. After 2 minutes of circulation time and raising the mean arterial pressure to 90 mm mercury, the distal internal carotid artery was controlled with a baby Gregory clamp. The external carotid was controlled with a vessel loop. The common carotid artery was controlled with a peripheral DeBakey clamp. A longitudinal opening was made in the common carotid artery just below the bifurcation. The arteriotomy was extended distally up in the internal carotid with Potts scissors. There was a large rubbery plaque with greater than 90% stenosis at the carotid bifurcation which extended several centimenters into the internal carotid artery. The arteriotomy was extended past the level of stenosis. There was brisk backbleeding from the internal carotid artery and due to the high extent of the lesion I did not think a shunt could be safely placed.   At this point, attention was then turned to the common carotid artery once again. A suitable endarterectomy plane was obtained and endarterectomy was begun in the common carotid artery and a good proximal endpoint was obtained. An eversion endarterectomy was performed on the external carotid artery and a good endpoint was obtained. The plaque was then elevated in the  internal carotid artery and a nice feathered distal endpoint was also obtained which was about 1 cm  above the hypoglossal nerve.  The plaque was passed off the table as a specimen. All loose debris was then removed from the carotid bed and everything was thoroughly irrigated with heparinized saline. The Dacron patch was then brought out on to the operative field this was sewn on as a patch angioplasty using running 6-0 Prolene suture.  The distal portion of the patch was difficult to place due to the high location of the arteriotomy so this was fairly tedious.  Prior to completion of the anastomosis, the internal carotid artery was thoroughly backbled. This was then controlled again with a baby Gregory clamp. The common carotid artery was thoroughly forward bled.  The external carotid was also thoroughly backbled.  The remainder of the patch was completed and the anastomosis was secured. Flow was then restored first retrograde from the external carotid into the carotid bed then antegrade from the common carotid to the external carotid artery and after approximately 5 cardiac cycles to the internal carotid artery. Doppler was used to evaluate the external/internal and common carotid arteries and these all had good Doppler flow. Hemostasis was obtained with an additional repair stitch at the patch distally. Hemostasis was obtained.  The platysma muscle was reapproximated using a running 3-0 Vicryl suture. The skin was closed with 4 Vicryl subcuticular stitch.  The patient tolerated the procedure well except for the episode of bradycardia.  The patient was awakened in the operating room and was following commands and moving upper extremities symmetrically prior to transfer to the PACU.   Fabienne Bruns, MD [facility] [place] Office: [phone] Pager: [phone]

## 2011-08-20 NOTE — Anesthesia Preprocedure Evaluation (Addendum)
Anesthesia Evaluation  Patient identified by MRN, date of birth, ID band Patient awake    Reviewed: Allergy & Precautions, H&P , NPO status , Patient's Chart, lab work & pertinent test results  History of Anesthesia Complications Negative for: history of anesthetic complications  Airway Mallampati: I TM Distance: >3 FB Neck ROM: full    Dental  (+) Teeth Intact   Pulmonary Current Smoker,          Cardiovascular Exercise Tolerance: Good hypertension, regular Normal Aortic occlusion w/o claudication   Neuro/Psych Anxiety CVA, Residual Symptoms    GI/Hepatic GERD-  ,(+)     substance abuse  alcohol use,   Endo/Other  Negative Endocrine ROS  Renal/GU      Musculoskeletal negative musculoskeletal ROS (+)   Abdominal   Peds  Hematology negative hematology ROS (+)   Anesthesia Other Findings   Reproductive/Obstetrics negative OB ROS                          Anesthesia Physical Anesthesia Plan  ASA: III  Anesthesia Plan: General LMA   Post-op Pain Management:    Induction:   Airway Management Planned:   Additional Equipment:   Intra-op Plan:   Post-operative Plan:   Informed Consent:   Plan Discussed with:   Anesthesia Plan Comments:         Anesthesia Quick Evaluation

## 2011-08-20 NOTE — Anesthesia Postprocedure Evaluation (Signed)
  Anesthesia Post-op Note  Patient: [name] [name]  Procedure(s) Performed: Procedure(s) (LRB): ENDARTERECTOMY CAROTID (Left)  Patient Location: PACU  Anesthesia Type: General  Level of Consciousness: awake  Airway and Oxygen Therapy: Patient Spontanous Breathing  Post-op Pain: mild  Post-op Assessment: Post-op Vital signs reviewed, Patient's Cardiovascular Status Stable, Respiratory Function Stable, Patent Airway, No signs of Nausea or vomiting and Pain level controlled  Post-op Vital Signs: stable  Complications: No apparent anesthesia complications

## 2011-08-20 NOTE — Progress Notes (Signed)
ANTICOAGULATION CONSULT NOTE - Follow Up Consult  Pharmacy Consult for heparin Indication: carotid stenosis  Labs:  Basename 08/20/11 0121 08/19/11 1640  HGB 11.7* 14.4  HCT 35.8* 43.0  PLT 193 220  APTT -- --  LABPROT -- 13.3  INR -- 0.99  HEPARINUNFRC 0.42 --  CREATININE -- 0.85  CKTOTAL -- --  CKMB -- --  TROPONINI -- --   Assessment/Plan: 46yo male therapeutic on heparin with initial dosing for carotid stenosis.  Will continue heparin at current rate and confirm stable with additional level.  Colleen Can PharmD BCPS 08/20/2011,2:16 AM
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
## 2011-08-20 NOTE — Preoperative (Signed)
Beta Blockers   Reason not to administer Beta Blockers:Not Applicable 

## 2011-08-20 NOTE — Progress Notes (Signed)
Speech Language/Pathology SLP Cancellation Note  Treatment cancelled today due to patient leaving for surgery per RN. Will f/u 2/21. Massie Maroon Vicki Chaffin Meryl 08/20/2011, 10:35 AM

## 2011-08-20 NOTE — Progress Notes (Signed)
Utilization review completed. Dirk Vanaman, RN, BSN. 08/20/11 

## 2011-08-20 NOTE — Progress Notes (Signed)
Heparin infusion discontinued per Darrick Penna MD

## 2011-08-20 NOTE — Anesthesia Procedure Notes (Signed)
Procedure Name: Intubation Date/Time: 08/20/2011 12:38 PM Performed by: Leona Singleton A. Patient Re-evaluated:Patient Re-evaluated prior to inductionOxygen Delivery Method: Circle System Utilized Preoxygenation: Pre-oxygenation with 100% oxygen Intubation Type: IV induction Ventilation: Mask ventilation without difficulty Laryngoscope Size: Miller and 2 Grade View: Grade I Tube type: Oral Tube size: 7.5 mm Number of attempts: 1 Airway Equipment and Method: stylet and LTA Placement Confirmation: ETT inserted through vocal cords under direct vision,  positive ETCO2 and breath sounds checked- equal and bilateral Secured at: 23 cm Tube secured with: Tape Dental Injury: Teeth and Oropharynx as per pre-operative assessment
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
## 2011-08-20 NOTE — Progress Notes (Signed)
The patient has been re-examined.  The patient's history and physical has been reviewed and is unchanged.  There is no change in the plan of care.  Paquita Printy, MD 

## 2011-08-21 LAB — BASIC METABOLIC PANEL
CO2: 22 mEq/L (ref 19–32)
Calcium: 9.1 mg/dL (ref 8.4–10.5)
Creatinine, Ser: 0.76 mg/dL (ref 0.50–1.35)
Glucose, Bld: 135 mg/dL — ABNORMAL HIGH (ref 70–99)

## 2011-08-21 LAB — CBC
Hemoglobin: 13.8 g/dL (ref 13.0–17.0)
MCH: 29.1 pg (ref 26.0–34.0)
MCV: 87.3 fL (ref 78.0–100.0)
RBC: 4.74 MIL/uL (ref 4.22–5.81)

## 2011-08-21 MED ORDER — HYDROCHLOROTHIAZIDE 50 MG PO TABS
50.0000 mg | ORAL_TABLET | Freq: Every day | ORAL | Status: DC
  Administered 2011-08-21 – 2011-08-23 (×3): 50 mg via ORAL
  Filled 2011-08-21 (×3): qty 1

## 2011-08-21 MED ORDER — HYDRALAZINE HCL 10 MG PO TABS
10.0000 mg | ORAL_TABLET | Freq: Four times a day (QID) | ORAL | Status: DC
  Filled 2011-08-21 (×4): qty 1

## 2011-08-21 MED ORDER — LISINOPRIL 10 MG PO TABS
10.0000 mg | ORAL_TABLET | Freq: Every day | ORAL | Status: DC
  Administered 2011-08-21: 10 mg via ORAL
  Filled 2011-08-21 (×2): qty 1

## 2011-08-21 MED ORDER — FAMOTIDINE 20 MG PO TABS
20.0000 mg | ORAL_TABLET | Freq: Two times a day (BID) | ORAL | Status: DC
  Administered 2011-08-21 – 2011-08-23 (×4): 20 mg via ORAL
  Filled 2011-08-21 (×6): qty 1

## 2011-08-21 MED FILL — Labetalol HCl IV Soln 5 MG/ML: INTRAVENOUS | Qty: 4 | Status: AC

## 2011-08-21 NOTE — Progress Notes (Signed)
transferred pt via w/c with belongings, notified lynette girlfriend of new room number. Beryle Quant

## 2011-08-21 NOTE — Progress Notes (Signed)
Utilization review completed. Deidra Spease, RN, BSN. 08/21/11 

## 2011-08-21 NOTE — Progress Notes (Signed)
[facility] [place]  Subjective  - POD #1 Left CEA overall feels well  Objective 172/91 72 97.9 F (36.6 C) (Oral) 16 93%  Intake/Output Summary (Last 24 hours) at 08/21/11 0838 Last data filed at 08/21/11 0700  Gross per 24 hour  Intake   3260 ml  Output   3726 ml  Net   -466 ml   Neuro- at baseline, no problems with swallow, still some speech problems, motor  Intact Neck incision clean no hematoma  Assessment/Planning: S/p CEA doing well except poor BP control  PT OT Increase BP meds  ASA  Sherran Margolis E 08/21/2011 8:38 AM --  Laboratory Lab Results:  Basename 08/21/11 0455 08/20/11 0121  WBC 10.1 6.1  HGB 13.8 11.7*  HCT 41.4 35.8*  PLT 230 193   BMET  Basename 08/21/11 0455 08/19/11 1640  NA 138 137  K 4.0 3.2*  CL 105 103  CO2 22 23  GLUCOSE 135* 160*  BUN 7 9  CREATININE 0.76 0.85  CALCIUM 9.1 9.4    COAG Lab Results  Component Value Date   INR 0.99 08/19/2011   No results found for this basename: PTT    Antibiotics Anti-infectives     Start     Dose/Rate Route Frequency Ordered Stop   08/21/11 0000   cefUROXime (ZINACEF) 1.5 g in dextrose 5 % 50 mL IVPB        1.5 g 100 mL/hr over 30 Minutes Intravenous Every 12 hours 08/20/11 1800 08/21/11 2359   08/20/11 0600   cefUROXime (ZINACEF) 1.5 g in dextrose 5 % 50 mL IVPB        1.5 g 100 mL/hr over 30 Minutes Intravenous On call to O.R. 08/19/11 1615 08/20/11 1236   08/19/11 1100   ceFAZolin (ANCEF) 1-5 GM-% IVPB  Status:  Discontinued     Comments: BARR, KAILEE: cabinet override         08/19/11 1100 08/19/11 1106
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
## 2011-08-21 NOTE — Progress Notes (Signed)
Physical Therapy Treatment and Discharge Summary Patient Details Name: [name] [name] MRN: [id number] DOB: 1965-10-03 Today's Date: 08/21/2011  PT Assessment/Plan  PT - Assessment/Plan Comments on Treatment Session: Patient now S/P CEA left aide. He is independent with all functional mobility and is to be discharged from PT with no skilled PT needs. Discussed with patient and he is in agreement. PT Plan: All goals met and education completed, patient discharged from PT services. Discussed with patient regarding continuing to be physically active at discharge - walking program. Follow Up Recommendations: No PT follow up Equipment Recommended: None recommended by PT PT Goals  Acute Rehab PT Goals PT Goal: Ambulate - Progress: Met PT Goal: Up/Down Stairs - Progress: Met PT Goal: Perform Home Exercise Program - Progress:  (N/A no program needed) Additional Goals PT Goal: Additional Goal #1 - Progress: Met PT Goal: Additional Goal #2 - Progress: Met  PT Treatment Precautions/Restrictions  Precautions Precautions: Fall Restrictions Weight Bearing Restrictions: No Mobility (including Balance) Bed Mobility Supine to Sit: 7: Independent Sit to Supine: 7: Independent Transfers Sit to Stand: 7: Independent;From bed;Without upper extremity assist Stand to Sit: 7: Independent;To bed;Without upper extremity assist Ambulation/Gait Ambulation/Gait Assistance: 7: Independent Ambulation/Gait Assistance Details (indicate cue type and reason): No imbalance noted Ambulation Distance (Feet): 600 Feet Assistive device: None Gait Pattern: Within Functional Limits Gait velocity: Speed - 3.1 feet.second - not indicative of any concern for falls or restrictions in community ambulation Stairs Assistance: 7: Independent Stair Management Technique: No rails;Alternating pattern;Forwards Number of Stairs: 8   Posture/Postural Control Posture/Postural Control: No significant limitations Balance Balance  Assessed: Yes Static Standing Balance Single Leg Stance - Right Leg: 45  Single Leg Stance - Left Leg: 45  Tandem Stance - Right Leg: 60  Tandem Stance - Left Leg: 60  Rhomberg - Eyes Opened: 60  Rhomberg - Eyes Closed: 60  Dynamic Standing Balance Dynamic Standing - Balance Support: No upper extremity supported Dynamic Standing - Level of Assistance: 7: Independent Dynamic Gait Index Level Surface: Normal Change in Gait Speed: Normal Gait with Horizontal Head Turns: Normal Gait with Vertical Head Turns: Normal Gait and Pivot Turn: Normal Step Over Obstacle: Normal Step Around Obstacles: Normal Steps: Normal Total Score: 24  High Level Balance High Level Balance Activites: Side stepping;Backward walking;Direction changes;Turns;Sudden stops;Head turns High Level Balance Comments: No imbalance noted End of Session PT - End of Session Equipment Utilized During Treatment: Gait belt Activity Tolerance: Patient tolerated treatment well Patient left: in bed;with call bell in reach Nurse Communication: Mobility status for ambulation General Behavior During Session: [facility] for tasks performed Cognition: [facility] for tasks performed  Edwyna Perfect, PT  Pager [phone]  08/21/2011, 11:26 AM

## 2011-08-21 NOTE — Progress Notes (Signed)
I ordered Home health planning with case manager for PT/OT?speech s/p stroke.  I also started him on lisinopril 10mg  with the HCTZ for BP control.

## 2011-08-22 ENCOUNTER — Encounter (HOSPITAL_COMMUNITY): Payer: Self-pay | Admitting: Vascular Surgery

## 2011-08-22 MED ORDER — LISINOPRIL 20 MG PO TABS
20.0000 mg | ORAL_TABLET | Freq: Every day | ORAL | Status: DC
  Administered 2011-08-22 – 2011-08-23 (×2): 20 mg via ORAL
  Filled 2011-08-22 (×2): qty 1

## 2011-08-22 NOTE — Progress Notes (Signed)
Occupational Therapy Treatment Patient Details Name: [name] [name] MRN: [id number] DOB: May 27, 1966 Today's Date: 08/22/2011  OT Assessment/Plan OT Assessment/Plan OT Plan: Discharge plan remains appropriate OT Frequency: Min 2X/week Follow Up Recommendations: Outpatient OT Equipment Recommended: None recommended by OT OT Goals Acute Rehab OT Goals Time For Goal Achievement: 7 days ADL Goals ADL Goal: Additional Goal #1 - Progress: Met Additional ADL Goal #2: Pt and wife with be I with RUE neuro-muscular re-education HEP. ADL Goal: Additional Goal #2 - Progress: Goal set today  OT Treatment Precautions/Restrictions  Precautions Precautions: Fall   ADL ADL ADL Comments: Spoke with patient and wife re: pt s/p CEA with no additional deficits. Pt. does present with weakness (pt and wife state this is due to "overdoing it" yesterday). Declining OOB with therapy today secondary to fatigue. Will continue to follow acutely for RUE neuromuscular re-education.  End of Session OT - End of Session Activity Tolerance: Patient limited by fatigue Patient left: in bed;with call bell in reach;with family/visitor present General Behavior During Session: [facility] for tasks performed Cognition:  (aphasic)  Toris Laverdiere  08/22/2011, 9:44 AM

## 2011-08-22 NOTE — Consult Note (Signed)
[name] [name] is a 1 ppd smoker who is eager to quit and is in the action stage. Pt says he doesn't know how he wants to quit as of yet. Discussed different options available to him. Pt to think on it and decide later. In the meantime referred pt to 1-800 quit now for f/u and support. Discussed oral fixation substitutes, second hand smoke and in home smoking policy. Reviewed and gave pt Written education/contact information.

## 2011-08-22 NOTE — Progress Notes (Addendum)
Pt doing well today, speech is better. Otherwise neuro better BP still High - requiring IV meds intermiittently.  Will increase lisinopril to 20 mg. Cont PT/OT/ST  VASCULAR AND VEIN SURGERY POST - OP CEA PROGRESS NOTE  Date of Surgery: 08/19/2011 - 08/20/2011 Surgeon: [name] [name]): [name], MD 2 Days Post-Op left Carotid Endarterectomy .  HPI: [name] [name] is a 46 y.o. male who is 2 Days Post-Op left Carotid Endarterectomy . Patient is doing well. Pre-operative symptoms are Improved Patient denies headache; Patient denies difficulty swallowing; denies weakness in upper or lower extremities; Pt. Still has expressive aphasia  IMAGING: No results found.  Intake/Output Summary (Last 24 hours) at 08/22/11 1131 Last data filed at 08/22/11 0800  Gross per 24 hour  Intake    840 ml  Output   1000 ml  Net   -160 ml    Physical Exam:  BP Readings from Last 3 Encounters:  08/22/11 161/95  08/22/11 161/95  08/15/11 155/90   Temp Readings from Last 3 Encounters:  08/22/11 99.7 F (37.6 C) Oral  08/22/11 99.7 F (37.6 C) Oral   SpO2 Readings from Last 3 Encounters:  08/22/11 96%  08/22/11 96%  08/15/11 100%   Pulse Readings from Last 3 Encounters:  08/22/11 91  08/22/11 91  08/15/11 84    Pt is A&O x 3 Gait is normal Speech is expressive aphasia left Neck Wound is healing well Patient with Negative tongue deviation and Negative facial droop Pt has good and equal strength in all extremities  Assessment: [name] [name] is a 46 y.o. male is S/P Carotid endarterectomy Pt is voiding, ambulating and taking po well Speech - expressive aphasia  Plan: Better BP control Lisinopril Increased this am Wife to get phone number of clinic in [place] for post-op follow-up of BP and Cholesteral control  Plan: [place] [phone] 08/22/2011 11:31 AM  Blood pressure control still subobtimal Needs pressures all below 150 prior to d/c Neuro stable  Incision  healing  Fabienne Bruns, MD [facility] [place] Office: [phone] Pager: [phone]

## 2011-08-23 LAB — BASIC METABOLIC PANEL
BUN: 16 mg/dL (ref 6–23)
CO2: 24 mEq/L (ref 19–32)
Calcium: 9.8 mg/dL (ref 8.4–10.5)
Creatinine, Ser: 1.17 mg/dL (ref 0.50–1.35)
GFR calc non Af Amer: 74 mL/min — ABNORMAL LOW (ref >90)
Glucose, Bld: 144 mg/dL — ABNORMAL HIGH (ref 70–99)
Sodium: 134 mEq/L — ABNORMAL LOW (ref 135–145)

## 2011-08-23 LAB — LIPID PANEL
LDL Cholesterol: 77 mg/dL (ref 0–99)
Triglycerides: 117 mg/dL (ref <150)
VLDL: 23 mg/dL (ref 0–40)

## 2011-08-23 MED ORDER — HYDROCHLOROTHIAZIDE 50 MG PO TABS: 50.0000 mg | ORAL_TABLET | Freq: Every day | ORAL | Status: DC

## 2011-08-23 MED ORDER — ASPIRIN 325 MG PO TBEC: 325.0000 mg | DELAYED_RELEASE_TABLET | Freq: Every day | ORAL | Status: AC

## 2011-08-23 MED ORDER — LISINOPRIL 20 MG PO TABS: 20.0000 mg | ORAL_TABLET | Freq: Every day | ORAL | Status: DC

## 2011-08-23 MED ORDER — OXYCODONE HCL 5 MG PO TABS: 5.0000 mg | ORAL_TABLET | ORAL | Status: AC | PRN

## 2011-08-23 NOTE — Progress Notes (Signed)
Pt. Discharged 08/23/2011  2:33 PM Discharge instructions reviewed with patient/family. Patient/family verbalized understanding. All Rx's given. Questions answered as needed. Pt. Discharged to home with family/self. Taken off unit via W/C. [name] [name]

## 2011-08-23 NOTE — Discharge Summary (Signed)
[facility] Discharge Summary  [name] [name] [birth year] 46 y.o. male  [id number]  Admission Date: 08/19/2011  Discharge Date: 08/23/11  Physician: [name], MD  Admission Diagnosis: carotid stenosis Carotid stenosis   HPI:   This is a 46 y.o. male who was transferred to [facility] today for interventional radiology angiogram. There was an attempt at [facility] accessing through the groins that was not successful due to atherosclerotic blockage. The Radiologist Dr. Nicki Reaper ask [place] for a consult after he found 95% blockage on the left internal carotid artery.    Hospital Course:  The patient was admitted to the hospital and taken to the operating room on 08/19/2011 - 08/20/2011 and underwent a left carotid endarterectomy.  The pt tolerated the procedure well and was transported to the PACU in good condition.   By POD 1, the pt neuro status was at baseline with no problems swallowing.  He was still having some speech problems, but motor was in tact.  His neck incision was clean without hematoma.  He was hypertensive post operatively and needed much better control of his BP before being d/c'd home.  He was started on HCTZ and lisinopril.  His pt has been well controlled over the past 24 hrs without needing prn BP medications to control BP.  He did have OT/PT/ST consults post operatively.  We will get social work to arrange these for him at home.  He does not qualify for home PT/OT/ST, so case manager will arrange this on Monday.  A Rx was given for these outpt.   The remainder of the hospital course consisted of increasing ambulation and increasing intake of solids without difficulty.    Basename 08/23/11 0530 08/21/11 0455  NA 134* 138  K 3.1* 4.0  CL 96 105  CO2 24 22  GLUCOSE 144* 135*  BUN 16 7  CALCIUM 9.8 9.1    Basename 08/21/11 0455  WBC 10.1  HGB 13.8  HCT 41.4  PLT 230   No results found for this basename: INR:2 in the  last 72 hours   Discharge Instructions:   The patient is discharged to home with extensive instructions on wound care and progressive ambulation.  They are instructed not to drive or perform any heavy lifting until returning to see the physician in his office.  Discharge Orders    Future Appointments: Provider: Department: Dept Phone: Center:   09/04/2011 1:15 PM [name], MD [facility] [phone] [facility]     Future Orders Please Complete By Expires   Resume previous diet      Driving Restrictions      Comments:   No driving   Lifting restrictions      Comments:   No lifting for 6 week   Call MD for:  temperature >100.5      Call MD for:  redness, tenderness, or signs of infection (pain, swelling, bleeding, redness, odor or green/yellow discharge around incision site)      Call MD for:  severe or increased pain, loss or decreased feeling  in affected limb(s)      Increase activity slowly      Comments:   Walk with assistance use walker or cane as needed   May shower       Scheduling Instructions:   Friday    CAROTID Sugery: Call MD for difficulty swallowing or speaking; weakness in arms or legs that is a new symtom; severe headache.  If you have increased swelling  in the neck and/or  are having difficulty breathing, CALL 911      Resume previous diet      Driving Restrictions      Comments:   No driving for  Until returning to see Dr. Darrick Penna in the office.   Lifting restrictions      Comments:   No lifting for 6 weeks   Call MD for:  temperature >100.5      Call MD for:  redness, tenderness, or signs of infection (pain, swelling, bleeding, redness, odor or green/yellow discharge around incision site)      Call MD for:  severe or increased pain, loss or decreased feeling  in affected limb(s)      CAROTID Sugery: Call MD for difficulty swallowing or speaking; weakness in arms or legs that is a new symtom; severe headache.  If you have increased swelling in the neck and/or   are having difficulty breathing, CALL 911         Discharge Diagnosis:  carotid stenosis Carotid stenosis  Secondary Diagnosis: There is no problem list on file for this patient.  Past Medical History  Diagnosis Date  . Hypertension   . Stroke   . GERD (gastroesophageal reflux disease)   . Nicotine addiction   . ETOH abuse   . Anxiety      [name] [name], [name] [name]  Home Medication Instructions [id number]   Printed on:08/23/11 [phone]  Medication Information                    Azilsartan Medoxomil (EDARBI) 80 MG TABS Take by mouth once. Has not taken any home medications since hospitalized at [facility]           loratadine (CLARITIN) 10 MG tablet Take 10 mg by mouth daily.           aspirin EC 325 MG EC tablet Take 1 tablet (325 mg total) by mouth daily.           hydrochlorothiazide (HYDRODIURIL) 50 MG tablet Take 1 tablet (50 mg total) by mouth daily.           lisinopril (PRINIVIL,ZESTRIL) 20 MG tablet Take 1 tablet (20 mg total) by mouth daily.           oxyCODONE (ROXICODONE) 5 MG immediate release tablet Take 1 tablet (5 mg total) by mouth every 4 (four) hours as needed for pain. #twenty NR            Disposition: home  Patient's condition: is Good  Follow up: 1. Dr.  Darrick Penna in 2 weeks. 2. Needs to f/u in clinic in [place] for medication adjustments for BP as well as control of cholesterol.    Newton Pigg, PA-C [facility] [phone] 08/23/2011  9:18 AM

## 2011-08-23 NOTE — Progress Notes (Addendum)
[facility] Progress Note  08/23/2011 8:54 AM POD 3  Subjective:  States his throat is sore.   His speech is improved from when I saw him 3-4 days ago. Nurse states that his BP this am was 129 systolic and he did not receive any PRN BP meds over the past 24 hrs.  150-160s sys  129 sys this am  HR 50-90s reg  99%RA  Tm 99.7 now 98.9 Filed Vitals:   08/22/11 2030  BP: 150/100  Pulse: 94  Temp: 98.9 F (37.2 C)  Resp: 18     Physical Exam: Neuro:  Strength equal bilaterally.  Aphasia appears to be improving. Incision:  C/d/i without drainage.  CBC    Component Value Date/Time   WBC 10.1 08/21/2011 0455   RBC 4.74 08/21/2011 0455   HGB 13.8 08/21/2011 0455   HCT 41.4 08/21/2011 0455   PLT 230 08/21/2011 0455   MCV 87.3 08/21/2011 0455   MCH 29.1 08/21/2011 0455   MCHC 33.3 08/21/2011 0455   RDW 13.6 08/21/2011 0455    BMET    Component Value Date/Time   NA 134* 08/23/2011 0530   K 3.1* 08/23/2011 0530   CL 96 08/23/2011 0530   CO2 24 08/23/2011 0530   GLUCOSE 144* 08/23/2011 0530   BUN 16 08/23/2011 0530   CREATININE 1.17 08/23/2011 0530   CALCIUM 9.8 08/23/2011 0530   GFRNONAA 74* 08/23/2011 0530   GFRAA 85* 08/23/2011 0530       Assessment/Plan:  This is a 46 y.o. male who is s/p left CEA POD 3 -BP now 150 this am with increase of lisinopril yesterday.  Systolic BP this am 129.   -Pt is doing better today. -d/c home today if PT/OT/ST can be arranged for pt at home . -f/u with Dr. Darrick Penna in 2 weeks. -stat social work consult to set up PT/OT/ST for home for possible d/c home today.  Newton Pigg, PA-C [facility] [phone]  Agree with above. Doing well. Home today  Di Kindle. Edilia Bo, MD, FACS Beeper [phone] 08/23/2011

## 2011-08-25 NOTE — Progress Notes (Signed)
   CARE MANAGEMENT NOTE 08/25/2011  Patient:  [name] [name],[name] [name]   Account Number:  [id number]  Date Initiated:  08/25/2011  Documentation initiated by:  [facility]  Subjective/Objective Assessment:   left carotid endarterectomy     Action/Plan:   Anticipated DC Date:  08/23/2011   Anticipated DC Plan:  HOME/SELF CARE      DC Planning Services  CM consult      Choice offered to / List presented to:          [facility] arranged  HH-2 PT  HH-6 SOCIAL WORKER  HH-5 SPEECH THERAPY      HH agency  [facility]   Status of service:  Completed, signed off Medicare Important Message given?   (If response is "NO", the following Medicare IM given date fields will be blank) Date Medicare IM given:   Date Additional Medicare IM given:    Discharge Disposition:  HOME W HOME HEALTH SERVICES  Per UR Regulation:    Comments:  08/25/2011 0900 Attempted to locate an outpatient facility in [facility] to service pt. Unable to locate facility that will accept pt without any insurance coverage. Contacted [facility] for [facility] services. Orders are in EPIC for [facility] also. Spoke to [facility] rep and they will follow up with pt on [facility]. Isidoro Donning RN CCM Case Mgmt phone [phone]  08/25/2011 0900 late entry- 08/23/2011 1200 Spoke to pt and states he is really interested in having speech. Explained NCM will follow up with him on Monday for outpatient SLP, PT and OT in his area. Pt was provided prescription for outpatient services. Isidoro Donning RN CCM Case Mgmt phone [phone]

## 2011-08-29 ENCOUNTER — Other Ambulatory Visit: Payer: Self-pay

## 2011-08-29 DIAGNOSIS — G8918 Other acute postprocedural pain: Secondary | ICD-10-CM

## 2011-08-29 MED ORDER — HYDROCODONE-ACETAMINOPHEN 5-500 MG PO TABS: 1.0000 | ORAL_TABLET | Freq: Four times a day (QID) | ORAL | Status: AC | PRN

## 2011-09-01 ENCOUNTER — Telehealth: Payer: Self-pay | Admitting: *Deleted

## 2011-09-01 NOTE — Telephone Encounter (Signed)
[facility] NURSE ASKED LYNETTE TO CALL Friday EA:[id number] BP:189/100 & 178/102. AFTER DISCUSSING WITH R.ROCZNIAK,PA HIS BP WAS ELEVATED ON D/C AND WAS TOLD TO SEE PCP IMMEDIATELY. PT DOES NOT HAVE A PCP AND HAD BEEN TRYING TO GET INTO [facility] BUT UNABLE TO GET APPT TIL April.I CALLED [facility] [phone] & WAS TOLD THEY CLOSED AT 1200PM AND TO CALL BACK Monday. THEY WOULD NOT GIVE ME AN APPT FOR Monday UNTIL Monday AM.I TOLD LYNETTE TO TAKE HIM TO ER IF DEVELOPED ANY STROKE SYMPTOMS AND SHE VERBALIZED UNDERSTANDING.I CALLED THIS MORNING AND THEY WERE UNABLE TO GET AN APPT. I CALLED [facility] AGAIN AND GOT AN APPT FOR WED AT 4:45PM.ACCORDING TO LYNETTE HIS BP WAS 158/105.SHE VERBALIZED UNDERSTANDING AGAIN OF GOING TO ER IF DEVELOPS ANY STROKE SYMPTOMS. I FAXED HIS HOSPITAL RECORDS TO [facility] FAX [phone].

## 2011-09-03 ENCOUNTER — Encounter: Payer: Self-pay | Admitting: Vascular Surgery

## 2011-09-04 ENCOUNTER — Encounter: Payer: Self-pay | Admitting: Vascular Surgery

## 2011-09-04 ENCOUNTER — Ambulatory Visit (INDEPENDENT_AMBULATORY_CARE_PROVIDER_SITE_OTHER): Payer: Self-pay | Admitting: Vascular Surgery

## 2011-09-04 VITALS — BP 152/89 | HR 79 | Resp 16 | Ht 75.0 in | Wt 224.0 lb

## 2011-09-04 DIAGNOSIS — I6529 Occlusion and stenosis of unspecified carotid artery: Secondary | ICD-10-CM

## 2011-09-04 DIAGNOSIS — I739 Peripheral vascular disease, unspecified: Secondary | ICD-10-CM | POA: Insufficient documentation

## 2011-09-04 NOTE — Progress Notes (Signed)
[facility] OF [place] HISTORY AND PHYSICAL    History of Present Illness:  Patient is a 46 y.o. year old male who presents for post-operative follow-up after left carotid endarterectomy.  Denies headaches, numbness, tingling or other neuro deficits.  No swallowing problems.  No incisional drainage.  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 He describes bilateral short distance claudication in both feet and legs. He denies rest pain. He has no history of ulcerations on the feet.  He still has some decreased sensation in his right arm from his preoperative stroke. He has return of most motor function in his right arm. He still has some difficulty with speech but this is also continuing to improve.   Physical Examination  Filed Vitals:   09/04/11 1258  BP: 152/89  Pulse: 79  Resp:     Body mass index is 28.00 kg/(m^2).  General:  Alert and oriented, no acute distress Neck: No bruit or JVD, well-healed left neck incision Skin: No rash Extremities:  2+ radial pulses, absent femoral pulses Neurologic: Upper and lower extremity motor 5/5 and symmetric   ASSESSMENT: Doing well status post left carotid endarterectomy. The patient will continue to take his aspirin daily. Will obtain a followup carotid duplex scan in 6 months time. As far as his infrarenal aortic occlusion is concerned. He will need a CT Angio the abdomen and pelvis to evaluate his runoff. However I believe he needs a few more weeks to recover from his operation before considering this. We will schedule him for a followup visit in 4-6 weeks. He will need cardiac risk stratification prior to aortobifemoral bypass grafting. He is also currently applying for Medicaid and this would give him some time to get this in place. I told him that he could actually Tylenol for his claudication symptoms. However I do not believe he needs narcotic pain medication for this. If his  symptoms get worse we'll consider proceeding sooner.   PLAN:  Followup with me in 4-6 weeks to discuss aortobifemoral bypass grafting which would also include cardiac risk stratification as well as CT Angio the abdomen and pelvis. He will need a carotid duplex scan in 6 months time.   Fabienne Bruns, MD [facility] [place] Office: [phone] Pager: [phone]

## 2011-09-09 ENCOUNTER — Encounter: Payer: Self-pay | Admitting: *Deleted

## 2011-09-09 ENCOUNTER — Telehealth: Payer: Self-pay | Admitting: *Deleted

## 2011-09-09 NOTE — Telephone Encounter (Signed)
Haywood Lasso said [name] [name] was suppose to go to court tomorrow but his lawyer said if we would fax a note he would be excused. We faxed a note to Mickel Fuchs.

## 2011-09-09 NOTE — Telephone Encounter (Signed)
[name] [name] a speech therapist from [facility] called to say [name] [name] is in a lot of pain. He describes his leg pain as a 10 out of 10. He can walk to the mailbox but on return his legs start to "kill him". It is relieved with rest. He is only taking extra strength Tylenol and they would like something stronger. She and the girlfriend in the background said his BP continues to be high (they have been to the [facility] and received medicines for BP)and questioned its elevation due to pain.I talked with Dr Darrick Penna and he said [name] was having the classic symptoms of claudication with the pain with exercise and relief at rest. Dr Darrick Penna said he wouldn't get much relief with narcotics either because of the claudication. He said to offer [name] to move his appts up to get the information for and discuss bypass surgery. I have asked the administrative pool to contact the pt with new appts.

## 2011-10-01 ENCOUNTER — Encounter: Payer: Self-pay | Admitting: Vascular Surgery

## 2011-10-02 ENCOUNTER — Ambulatory Visit (INDEPENDENT_AMBULATORY_CARE_PROVIDER_SITE_OTHER): Payer: Self-pay | Admitting: Vascular Surgery

## 2011-10-02 ENCOUNTER — Encounter: Payer: Self-pay | Admitting: Vascular Surgery

## 2011-10-02 ENCOUNTER — Ambulatory Visit (INDEPENDENT_AMBULATORY_CARE_PROVIDER_SITE_OTHER): Payer: Self-pay | Admitting: *Deleted

## 2011-10-02 VITALS — BP 175/98 | HR 68 | Temp 97.8°F | Resp 16 | Ht 74.0 in | Wt 223.0 lb

## 2011-10-02 DIAGNOSIS — Z01818 Encounter for other preprocedural examination: Secondary | ICD-10-CM

## 2011-10-02 DIAGNOSIS — Z01810 Encounter for preprocedural cardiovascular examination: Secondary | ICD-10-CM

## 2011-10-02 DIAGNOSIS — I70219 Atherosclerosis of native arteries of extremities with intermittent claudication, unspecified extremity: Secondary | ICD-10-CM | POA: Insufficient documentation

## 2011-10-02 DIAGNOSIS — I700 Atherosclerosis of aorta: Secondary | ICD-10-CM

## 2011-10-02 DIAGNOSIS — I6529 Occlusion and stenosis of unspecified carotid artery: Secondary | ICD-10-CM
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
## 2011-10-07 ENCOUNTER — Other Ambulatory Visit: Payer: Self-pay | Admitting: Physician Assistant

## 2011-10-08 ENCOUNTER — Other Ambulatory Visit: Payer: Self-pay | Admitting: *Deleted

## 2011-10-08 DIAGNOSIS — I10 Essential (primary) hypertension: Secondary | ICD-10-CM

## 2011-10-08 MED ORDER — LISINOPRIL 20 MG PO TABS: 20.0000 mg | ORAL_TABLET | Freq: Two times a day (BID) | ORAL | Status: DC

## 2011-10-09 ENCOUNTER — Ambulatory Visit: Payer: Self-pay | Admitting: Vascular Surgery

## 2011-10-14 ENCOUNTER — Other Ambulatory Visit: Payer: Self-pay

## 2011-10-16 ENCOUNTER — Other Ambulatory Visit: Payer: Self-pay

## 2011-10-22 ENCOUNTER — Other Ambulatory Visit: Payer: Self-pay | Admitting: Physician Assistant

## 2011-10-23 ENCOUNTER — Ambulatory Visit: Payer: Self-pay | Admitting: Cardiovascular Disease

## 2011-10-27 ENCOUNTER — Other Ambulatory Visit: Payer: Self-pay | Admitting: Physician Assistant

## 2011-10-28 ENCOUNTER — Other Ambulatory Visit: Payer: Self-pay | Admitting: Physician Assistant

## 2011-10-28 ENCOUNTER — Telehealth: Payer: Self-pay | Admitting: Vascular Surgery

## 2011-10-28 NOTE — Telephone Encounter (Signed)
Message copied by Sara Chu on Tue Oct 28, 2011  4:38 PM ------      Message from: Phillips Odor      Created: Tue Oct 28, 2011 11:33 AM      Regarding: change appt.      Contact: [phone]       Haywood Lasso, pt's girlfriend called.  Had to r/s his CTA and doesn't have cardiology eval. Until 5/14.  Needs to have appt. W/ CEF moved from 5/2 until after cardiol. Eval. On 5/14.  Please call girlfriend to reschedule.

## 2011-10-28 NOTE — Telephone Encounter (Signed)
Spoke with patients girlfriend to reschedule appt to 5/23

## 2011-10-30 ENCOUNTER — Ambulatory Visit: Payer: Self-pay | Admitting: Vascular Surgery

## 2011-11-11 ENCOUNTER — Ambulatory Visit: Payer: Self-pay | Admitting: Cardiovascular Disease

## 2011-11-11 DIAGNOSIS — I10 Essential (primary) hypertension: Secondary | ICD-10-CM | POA: Insufficient documentation

## 2011-11-11 DIAGNOSIS — F419 Anxiety disorder, unspecified: Secondary | ICD-10-CM | POA: Insufficient documentation

## 2011-11-11 DIAGNOSIS — F17200 Nicotine dependence, unspecified, uncomplicated: Secondary | ICD-10-CM | POA: Insufficient documentation

## 2011-11-11 DIAGNOSIS — K219 Gastro-esophageal reflux disease without esophagitis: Secondary | ICD-10-CM | POA: Insufficient documentation

## 2011-11-19 ENCOUNTER — Encounter: Payer: Self-pay | Admitting: Vascular Surgery

## 2011-11-20 ENCOUNTER — Ambulatory Visit: Payer: Self-pay | Admitting: Vascular Surgery

## 2011-11-20 ENCOUNTER — Other Ambulatory Visit: Payer: Self-pay

## 2011-12-09 ENCOUNTER — Other Ambulatory Visit: Payer: Self-pay

## 2011-12-11 ENCOUNTER — Encounter: Payer: Self-pay | Admitting: *Deleted

## 2011-12-11 ENCOUNTER — Encounter: Payer: Self-pay | Admitting: Cardiovascular Disease

## 2011-12-12 ENCOUNTER — Ambulatory Visit: Payer: Self-pay | Admitting: Cardiovascular Disease

## 2011-12-16 DIAGNOSIS — I6529 Occlusion and stenosis of unspecified carotid artery: Secondary | ICD-10-CM

## 2011-12-16 DIAGNOSIS — I70219 Atherosclerosis of native arteries of extremities with intermittent claudication, unspecified extremity: Secondary | ICD-10-CM

## 2011-12-17 ENCOUNTER — Encounter: Payer: Self-pay | Admitting: Vascular Surgery

## 2011-12-18 ENCOUNTER — Ambulatory Visit
Admission: RE | Admit: 2011-12-18 | Discharge: 2011-12-18 | Disposition: A | Discharge status: Home / Self Care | Payer: Medicaid Other | Source: Ambulatory Visit | Attending: Vascular Surgery | Admitting: Vascular Surgery

## 2011-12-18 ENCOUNTER — Ambulatory Visit (INDEPENDENT_AMBULATORY_CARE_PROVIDER_SITE_OTHER): Payer: Medicaid Other | Admitting: Vascular Surgery

## 2011-12-18 ENCOUNTER — Encounter: Payer: Self-pay | Admitting: Vascular Surgery

## 2011-12-18 VITALS — BP 182/92 | HR 51 | Temp 97.0°F | Resp 18 | Ht 74.0 in | Wt 229.0 lb

## 2011-12-18 DIAGNOSIS — I70219 Atherosclerosis of native arteries of extremities with intermittent claudication, unspecified extremity: Secondary | ICD-10-CM

## 2011-12-18 DIAGNOSIS — I6529 Occlusion and stenosis of unspecified carotid artery: Secondary | ICD-10-CM

## 2011-12-18 MED ORDER — IOHEXOL 350 MG/ML SOLN
150.0000 mL | Freq: Once | INTRAVENOUS | Status: AC | PRN
  Administered 2011-12-18: 150 mL via INTRAVENOUS

## 2011-12-18 NOTE — Progress Notes (Signed)
Patient returns for followup today. He previously underwent left carotid endarterectomy several weeks ago after a stroke. He has done well from this. He still has residual right arm numbness and slurring of speech secondary to his preoperative stroke. He also has a known infrarenal aortic occlusion. He returns today after CT Angio with runoff for further consideration of aortobifemoral bypass grafting. He was scheduled for cardiology risk stratification. However he recently missed his appointment. This has been rescheduled for early next week. He continues to experience shortness and claudication of both lower extremities. He does not have rest pain. He does not have ulcerations on the feet.  Review of systems: He denies chest pain. He denies shortness of breath.  Physical exam: Filed Vitals:   12/18/11 1102  BP: 182/92  Pulse: 51  Temp: 97 F (36.1 C)  TempSrc: Oral  Resp: 18  Height: 6\' 2"  (1.88 m)  Weight: 229 lb (103.874 kg)  SpO2: 100%    Lower extremities: Absent femoral popliteal and pedal pulses  Skin: No ulcer or rash  Chest: Clear to auscultation  Cardiac: Regular rate and rhythm  Neck: Well-healed carotid endarterectomy incision  Data: CT angiogram the abdomen and pelvis is reviewed today. The left and right renal arteries are patent. There is an infrarenal aortic occlusion just below the renal arteries. He has reconstitution of the common femoral arteries bilaterally via collaterals. The internal iliac arteries fill retrograde bilaterally. The common femoral profunda femoris superficial femoral popliteal and tibial arteries are patent bilaterally.  Assessment: Infrarenal aortic occlusion.  Plan: Aortobifemoral bypass 01/12/2012. This is contingent on his cardiac risk stratification assessment. Risks benefits possible complications and procedure details of aortobifemoral bypass grafting were explained to the patient and his wife today. These include but are not limited to  graft occlusion, limb loss, renal failure, graft infection, bleeding requiring transfusion. He understands and agrees to proceed.  Fabienne Bruns, MD [facility] [place] Office: [phone] Pager: [phone]

## 2011-12-22 ENCOUNTER — Ambulatory Visit: Payer: Self-pay | Admitting: Cardiovascular Disease

## 2011-12-29 ENCOUNTER — Other Ambulatory Visit: Payer: Self-pay | Admitting: Family Medicine

## 2011-12-30 ENCOUNTER — Other Ambulatory Visit: Payer: Self-pay | Admitting: Family Medicine

## 2011-12-31 ENCOUNTER — Encounter: Payer: Self-pay | Admitting: Cardiovascular Disease

## 2011-12-31 ENCOUNTER — Ambulatory Visit (INDEPENDENT_AMBULATORY_CARE_PROVIDER_SITE_OTHER): Payer: Medicaid Other | Admitting: Cardiovascular Disease

## 2011-12-31 VITALS — BP 197/103 | HR 76 | Ht 74.0 in | Wt 232.0 lb

## 2011-12-31 DIAGNOSIS — I6529 Occlusion and stenosis of unspecified carotid artery: Secondary | ICD-10-CM

## 2011-12-31 DIAGNOSIS — I10 Essential (primary) hypertension: Secondary | ICD-10-CM

## 2011-12-31 DIAGNOSIS — I739 Peripheral vascular disease, unspecified: Secondary | ICD-10-CM

## 2011-12-31 DIAGNOSIS — F17200 Nicotine dependence, unspecified, uncomplicated: Secondary | ICD-10-CM

## 2011-12-31 DIAGNOSIS — Z01810 Encounter for preprocedural cardiovascular examination: Secondary | ICD-10-CM

## 2011-12-31 DIAGNOSIS — Z01818 Encounter for other preprocedural examination: Secondary | ICD-10-CM

## 2011-12-31 MED ORDER — ATENOLOL 100 MG PO TABS: 100.0000 mg | ORAL_TABLET | Freq: Every day | ORAL | Status: DC

## 2011-12-31 MED ORDER — HYDROCHLOROTHIAZIDE 50 MG PO TABS: 50.0000 mg | ORAL_TABLET | Freq: Every day | ORAL | Status: DC

## 2011-12-31 NOTE — Patient Instructions (Addendum)
Your physician wants you to follow-up in: 6 MONTHS WITH DR Haywood Filler will receive a reminder letter in the mail two months in advance. If you don't receive a letter, please call our office to schedule the follow-up appointment. Your physician recommends that you continue on your current medications as directed. Please refer to the Current Medication list given to you today. Your physician has requested that you have an echocardiogram. Echocardiography is a painless test that uses sound waves to create images of your heart. It provides your doctor with information about the size and shape of your heart and how well your heart's chambers and valves are working. This procedure takes approximately one hour. There are no restrictions for this procedure. DX PRE OP Your physician has requested that you have a lexiscan myoview. For further information please visit [email or web address] Please follow instruction sheet, as given. DX PRE OP

## 2011-12-31 NOTE — Assessment & Plan Note (Signed)
Smoker with advanced vascular disease needing aortobifem and abnormal ECG  Echo to R/O hypertrophic DCM and lexiscan myovue to R/O CAD

## 2011-12-31 NOTE — Progress Notes (Signed)
Patient ID: [name] [name], male   DOB: 08/01/1965, 46 y.o.   MRN: [id number] 46 yo with severe vascular disease.  LCEA done by Dr Darrick Penna earlier this year.  Has distal aortic occlusion with need for aortobifem bypass.  Referred for preop cardiovascular evaluation.  Had CVA before CEA.  Has some residual RUE weakness and stammering speech.  Activity limited by claudication.  Still smoking Admits to 1pp week.  Coundseled for less than 10 minutes on cessation Seems motivated to not pick them up after surgery.  Has abnormal ECG with biphasic anterolateral T waves.  No chest pain dyspnea palpitations or syncope.  Has run out of BP meds and needs refills  ROS: Denies fever, malais, weight loss, blurry vision, decreased visual acuity, cough, sputum, SOB, hemoptysis, pleuritic pain, palpitaitons, heartburn, abdominal pain, melena, lower extremity edema, claudication, or rash.  All other systems reviewed and negative   General: Affect appropriate Middle age black male   HEENT: normal Some hesitancy in speech Neck supple with no adenopathy JVP normal no bruits no thyromegaly Lungs clear with no wheezing and good diaphragmatic motion Heart:  S1/S2 no murmur,rub, gallop or click PMI normal Abdomen: benighn, BS positve, no tenderness, no AAA no bruit.  No HSM or HJR Distal pulses intact with no bruits No edema Neuro mild aphasia and RUE weakness  Skin warm and dry No muscular weakness  Medications Current Outpatient Prescriptions  Medication Sig Dispense Refill  . aspirin 325 MG EC tablet Take 325 mg by mouth daily.      Marland Kitchen atenolol (TENORMIN) 100 MG tablet Take 100 mg by mouth daily.       Marland Kitchen CINNAMON PO Take 1,000 mg by mouth daily.      . clonazePAM (KLONOPIN) 1 MG tablet Take 1 mg by mouth 3 (three) times daily as needed.      . hydrochlorothiazide (HYDRODIURIL) 50 MG tablet Take 1 tablet (50 mg total) by mouth daily.  30 tablet  1  . lisinopril (PRINIVIL,ZESTRIL) 20 MG tablet Take 1 tablet (20 mg  total) by mouth 2 (two) times daily.  60 tablet  3  . loratadine (CLARITIN) 10 MG tablet Take 10 mg by mouth daily.        Allergies Review of patient's allergies indicates no known allergies.  Family History: No family history on file.  Social History: History   Social History  . Marital Status: Single    Spouse Name: N/A    Number of Children: 5  . Years of Education: N/A   Occupational History  . unemployed    Social History Main Topics  . Smoking status: Current Everyday Smoker -- 0.1 packs/day for 25 years    Types: Cigarettes  . Smokeless tobacco: Never Used  . Alcohol Use: 20.0 oz/week    40 drink(s) per week  . Drug Use: No  . Sexually Active: Yes   Other Topics Concern  . Not on file   Social History Narrative  . No narrative on file    Electrocardiogram:  10/11/11  SB rate 55 biphasic T waves anterolateral leads  Assessment and Plan

## 2011-12-31 NOTE — Assessment & Plan Note (Signed)
Discussed smoking cessation post op.  He understands the link between vascular disease and smoking

## 2011-12-31 NOTE — Assessment & Plan Note (Signed)
Aortobifem scheduled for 7/15  F/U Dr Darrick Penna

## 2011-12-31 NOTE — Assessment & Plan Note (Signed)
Previous CEA F/U duplex in 6 months ASA

## 2011-12-31 NOTE — Assessment & Plan Note (Signed)
Well controlled.  Continue current medications and low sodium Dash type diet.   Refill for meds called in  Compliance discussed

## 2012-01-05 ENCOUNTER — Encounter (HOSPITAL_COMMUNITY): Payer: Self-pay | Admitting: Pharmacy Technician

## 2012-01-07 ENCOUNTER — Ambulatory Visit (HOSPITAL_COMMUNITY): Payer: Medicaid Other | Attending: Cardiovascular Disease | Admitting: Radiology

## 2012-01-07 ENCOUNTER — Other Ambulatory Visit: Payer: Self-pay

## 2012-01-07 VITALS — BP 168/103 | HR 57 | Ht 74.0 in | Wt 224.0 lb

## 2012-01-07 DIAGNOSIS — R5381 Other malaise: Secondary | ICD-10-CM | POA: Insufficient documentation

## 2012-01-07 DIAGNOSIS — R0602 Shortness of breath: Secondary | ICD-10-CM

## 2012-01-07 DIAGNOSIS — Z01818 Encounter for other preprocedural examination: Secondary | ICD-10-CM

## 2012-01-07 DIAGNOSIS — Z8673 Personal history of transient ischemic attack (TIA), and cerebral infarction without residual deficits: Secondary | ICD-10-CM | POA: Insufficient documentation

## 2012-01-07 DIAGNOSIS — R0609 Other forms of dyspnea: Secondary | ICD-10-CM | POA: Insufficient documentation

## 2012-01-07 DIAGNOSIS — R000 Tachycardia, unspecified: Secondary | ICD-10-CM | POA: Insufficient documentation

## 2012-01-07 DIAGNOSIS — I10 Essential (primary) hypertension: Secondary | ICD-10-CM | POA: Insufficient documentation

## 2012-01-07 DIAGNOSIS — I639 Cerebral infarction, unspecified: Secondary | ICD-10-CM

## 2012-01-07 DIAGNOSIS — F17200 Nicotine dependence, unspecified, uncomplicated: Secondary | ICD-10-CM | POA: Insufficient documentation

## 2012-01-07 DIAGNOSIS — R0989 Other specified symptoms and signs involving the circulatory and respiratory systems: Secondary | ICD-10-CM | POA: Insufficient documentation

## 2012-01-07 DIAGNOSIS — I739 Peripheral vascular disease, unspecified: Secondary | ICD-10-CM | POA: Insufficient documentation

## 2012-01-07 DIAGNOSIS — Z01810 Encounter for preprocedural cardiovascular examination: Secondary | ICD-10-CM

## 2012-01-07 DIAGNOSIS — I779 Disorder of arteries and arterioles, unspecified: Secondary | ICD-10-CM | POA: Insufficient documentation

## 2012-01-07 MED ORDER — TECHNETIUM TC 99M TETROFOSMIN IV KIT
11.0000 | PACK | Freq: Once | INTRAVENOUS | Status: AC | PRN
  Administered 2012-01-07: 11 via INTRAVENOUS

## 2012-01-07 MED ORDER — REGADENOSON 0.4 MG/5ML IV SOLN
0.4000 mg | Freq: Once | INTRAVENOUS | Status: AC
  Administered 2012-01-07: 0.4 mg via INTRAVENOUS

## 2012-01-07 MED ORDER — TECHNETIUM TC 99M TETROFOSMIN IV KIT
33.0000 | PACK | Freq: Once | INTRAVENOUS | Status: AC | PRN
  Administered 2012-01-07: 33 via INTRAVENOUS

## 2012-01-07 NOTE — Progress Notes (Signed)
[facility] NUCLEAR MED [address] [place] [address] [phone]  Cardiology Nuclear Med Study  [name] [name] is a 46 y.o. male     MRN : [id number]     DOB: [birth year]  Procedure Date: 01/07/2012  Nuclear Med Background Indication for Stress Test:  Evaluation for Ischemia and Pending Surgical Clearance for Aorta Bifemoral Bypass Surgery on 01-12-12 by Fabienne Bruns, MD History:  2/13 (L) CEA; No Previous Documented CAD Cardiac Risk Factors: Carotid Disease, Claudication, CVA, Hypertension and Smoker  Symptoms:  DOE, Fatigue and Rapid HR   Nuclear Pre-Procedure Caffeine/Decaff Intake:  None > 12 hrs NPO After: 8:00pm   Lungs:  clear O2 Sat: 96% on room air. IV 0.9% NS with Angio Cath:  20g  IV Site: R Wrist x 1, tolerated well IV Started by:  Irean Hong, RN  Chest Size (in):  44 Cup Size: n/a  Height: 6\' 2"  (1.88 m)  Weight:  224 lb (101.606 kg)  BMI:  Body mass index is 28.76 kg/(m^2). Tech Comments:  Took atenolol this am    Nuclear Med Study 1 or 2 day study: 1 day  Stress Test Type:  Lexiscan  Reading MD: Charlton Haws, MD  Order Authorizing Provider:  Charlton Haws, MD  Resting Radionuclide: Technetium 52m Tetrofosmin  Resting Radionuclide Dose: 10.8 mCi   Stress Radionuclide:  Technetium 18m Tetrofosmin  Stress Radionuclide Dose: 32.0 mCi           Stress Protocol Rest HR: 57 Stress HR: 86  Rest BP: 168/103 Stress BP: 152/90  Exercise Time (min): n/a METS: n/a   Predicted Max HR: 174 bpm % Max HR: 49.43 bpm Rate Pressure Product: 40102   Dose of Adenosine (mg):  n/a Dose of Lexiscan: 0.4 mg  Dose of Atropine (mg): n/a Dose of Dobutamine: n/a mcg/kg/min (at max HR)  Stress Test Technologist: Smiley Houseman, CMA-N  Nuclear Technologist:  Domenic Polite, CNMT     Rest Procedure:  Myocardial perfusion imaging was performed at rest 45 minutes following the intravenous administration of Technetium 77m Tetrofosmin.  Rest ECG:  Nonspecific T-wave changes.  Stress Procedure:  The patient received IV Lexiscan 0.4 mg over 15-seconds.  Technetium 65m Tetrofosmin injected at 30-seconds.  There were no significant changes with Lexiscan.  Quantitative spect images were obtained after a 45 minute delay.  Stress ECG: No significant change from baseline ECG  QPS Raw Data Images:  Normal; no motion artifact; normal heart/lung ratio. Stress Images:  Normal homogeneous uptake in all areas of the myocardium. Rest Images:  Normal homogeneous uptake in all areas of the myocardium. Subtraction (SDS):  Normal Transient Ischemic Dilatation (Normal <1.22):  1.04 Lung/Heart Ratio (Normal <0.45):  0.32  Quantitative Gated Spect Images QGS EDV:  136 ml QGS ESV:  72 ml  Impression Exercise Capacity:  Lexiscan with no exercise. BP Response:  Normal blood pressure response. Clinical Symptoms:  No significant symptoms noted. ECG Impression:  No significant ST segment change suggestive of ischemia. Comparison with Prior Nuclear Study: No previous nuclear study performed  Overall Impression:  Normal stress nuclear study.  LV Ejection Fraction: 47%.  LV Wall Motion:  Surface images look nomral despite calcualted EF mildly reduced   Charlton Haws

## 2012-01-08 ENCOUNTER — Encounter (HOSPITAL_COMMUNITY)
Admission: RE | Admit: 2012-01-08 | Discharge: 2012-01-08 | Disposition: A | Discharge status: Home / Self Care | Payer: Medicaid Other | Source: Ambulatory Visit | Attending: Vascular Surgery | Admitting: Vascular Surgery

## 2012-01-08 ENCOUNTER — Ambulatory Visit (HOSPITAL_BASED_OUTPATIENT_CLINIC_OR_DEPARTMENT_OTHER): Payer: Medicaid Other | Admitting: Radiology

## 2012-01-08 ENCOUNTER — Encounter (HOSPITAL_COMMUNITY): Payer: Self-pay

## 2012-01-08 DIAGNOSIS — Z01818 Encounter for other preprocedural examination: Secondary | ICD-10-CM

## 2012-01-08 DIAGNOSIS — R9431 Abnormal electrocardiogram [ECG] [EKG]: Secondary | ICD-10-CM

## 2012-01-08 LAB — BLOOD GAS, ARTERIAL
Acid-Base Excess: 4.3 mmol/L — ABNORMAL HIGH (ref 0.0–2.0)
Bicarbonate: 28 mEq/L — ABNORMAL HIGH (ref 20.0–24.0)
FIO2: 0.21 %
O2 Saturation: 97.3 %
Patient temperature: 98.6
TCO2: 29.2 mmol/L (ref 0–100)

## 2012-01-08 LAB — URINALYSIS, ROUTINE W REFLEX MICROSCOPIC
Bilirubin Urine: NEGATIVE
Ketones, ur: NEGATIVE mg/dL
Leukocytes, UA: NEGATIVE
Nitrite: NEGATIVE
Protein, ur: NEGATIVE mg/dL

## 2012-01-08 LAB — CBC
HCT: 43.6 % (ref 39.0–52.0)
Hemoglobin: 14.6 g/dL (ref 13.0–17.0)
MCH: 30 pg (ref 26.0–34.0)
MCHC: 33.5 g/dL (ref 30.0–36.0)
MCV: 89.5 fL (ref 78.0–100.0)

## 2012-01-08 LAB — COMPREHENSIVE METABOLIC PANEL
BUN: 9 mg/dL (ref 6–23)
Calcium: 9.6 mg/dL (ref 8.4–10.5)
Creatinine, Ser: 1.01 mg/dL (ref 0.50–1.35)
GFR calc Af Amer: >90 mL/min (ref >90)
GFR calc non Af Amer: 87 mL/min — ABNORMAL LOW (ref >90)
Glucose, Bld: 188 mg/dL — ABNORMAL HIGH (ref 70–99)
Total Protein: 7.4 g/dL (ref 6.0–8.3)

## 2012-01-08 LAB — APTT: aPTT: 27 seconds (ref 24–37)

## 2012-01-08 LAB — URINE MICROSCOPIC-ADD ON

## 2012-01-08 LAB — TYPE AND SCREEN: ABO/RH(D): A POS

## 2012-01-08 NOTE — Pre-Procedure Instructions (Signed)
20 [name] [name]  01/08/2012   Your procedure is scheduled on:  Mon, July 15 @ 7:30 AM  Report to [facility] at 5:30 AM.  Call this number if you have problems the morning of surgery: [phone]   Remember:   Do not eat food:After Midnight.  Take these medicines the morning of surgery with A SIP OF WATER: Atenolol(Tenormin)   Do not wear jewelry  Do not wear lotions  Men may shave face and neck.  Do not bring valuables to the hospital.  Contacts, dentures or bridgework may not be worn into surgery.  Leave suitcase in the car. After surgery it may be brought to your room.  For patients admitted to the hospital, checkout time is 11:00 AM the day of discharge.   Patients discharged the day of surgery will not be allowed to drive home.    Special Instructions: CHG Shower Use Special Wash: 1/2 bottle night before surgery and 1/2 bottle morning of surgery.   Please read over the following fact sheets that you were given: Pain Booklet, Coughing and Deep Breathing, Blood Transfusion Information, MRSA Information and Surgical Site Infection Prevention

## 2012-01-08 NOTE — Progress Notes (Signed)
Echocardiogram performed.  

## 2012-01-09 NOTE — Consult Note (Signed)
Anesthesia Chart Review:  Patient is a 46 year old male scheduled for AFBG for AIOD.  This is scheduled for 01/12/12 by Dr. Darrick Penna.  History includes smoking, GERD, HTN, ETOH abuse, anxiety, CVA, left carotid endarterectomy 08/2011.  By labs, he also has hyperglycemia.    He saw Dr. Eden Emms on 12/31/11 for a pre-operative cardiac evaluation.  He recommended a stress and echo (see below).  EKG on 08/20/11 showed SB, possible LAE, lateral T wave inversion.    Nuclear stress test on 01/07/12 showed: Normal stress nuclear study.  LV Ejection Fraction: 47%. LV Wall Motion: Surface images look nomral despite calcualted EF mildly reduced   Echo on 01/08/12 showed: Left ventricle: The cavity size was normal. Wall thickness was increased in a pattern of mild LVH. Systolic function was normal. The estimated ejection fraction was in the range of 55% to 60%.  Trivial MR/TR.  CXR on 08/19/11 showed low lung volumes, otherwise no acute cardiopulmonary abnormality.    Labs noted.  Non-fasting glucose 188.  UA also with glucose (to a larger extent than on 08/19/11).  Will check CBG on arrival.  These results called to [facility] at [facility].  She will notify Dr. Darrick Penna.  He will probably need peri-operative hyperglycemia protocols.  Shonna Chock, PA-C

## 2012-01-11 MED ORDER — DEXTROSE 5 % IV SOLN
1.5000 g | INTRAVENOUS | Status: AC
  Administered 2012-01-12: 1.5 g via INTRAVENOUS
  Filled 2012-01-11: qty 1.5

## 2012-01-12 ENCOUNTER — Inpatient Hospital Stay (HOSPITAL_COMMUNITY): Payer: Medicaid Other

## 2012-01-12 ENCOUNTER — Encounter (HOSPITAL_COMMUNITY)
Admission: RE | Disposition: A | Discharge status: Home / Self Care | Payer: Self-pay | Source: Ambulatory Visit | Attending: Vascular Surgery

## 2012-01-12 ENCOUNTER — Ambulatory Visit (HOSPITAL_COMMUNITY): Payer: Medicaid Other | Admitting: Vascular Surgery

## 2012-01-12 ENCOUNTER — Inpatient Hospital Stay (HOSPITAL_COMMUNITY)
Admission: RE | Admit: 2012-01-12 | Discharge: 2012-01-17 | DRG: 238 | Disposition: A | Discharge status: Home / Self Care | Payer: Medicaid Other | Source: Ambulatory Visit | Attending: Vascular Surgery | Admitting: Vascular Surgery

## 2012-01-12 ENCOUNTER — Encounter (HOSPITAL_COMMUNITY): Payer: Self-pay | Admitting: Vascular Surgery

## 2012-01-12 DIAGNOSIS — I69998 Other sequelae following unspecified cerebrovascular disease: Secondary | ICD-10-CM

## 2012-01-12 DIAGNOSIS — I6529 Occlusion and stenosis of unspecified carotid artery: Secondary | ICD-10-CM | POA: Diagnosis present

## 2012-01-12 DIAGNOSIS — R5381 Other malaise: Secondary | ICD-10-CM | POA: Diagnosis present

## 2012-01-12 DIAGNOSIS — E119 Type 2 diabetes mellitus without complications: Secondary | ICD-10-CM

## 2012-01-12 DIAGNOSIS — I70219 Atherosclerosis of native arteries of extremities with intermittent claudication, unspecified extremity: Principal | ICD-10-CM | POA: Diagnosis present

## 2012-01-12 DIAGNOSIS — I7409 Other arterial embolism and thrombosis of abdominal aorta: Secondary | ICD-10-CM | POA: Diagnosis present

## 2012-01-12 DIAGNOSIS — E876 Hypokalemia: Secondary | ICD-10-CM | POA: Diagnosis not present

## 2012-01-12 DIAGNOSIS — F1010 Alcohol abuse, uncomplicated: Secondary | ICD-10-CM | POA: Diagnosis present

## 2012-01-12 DIAGNOSIS — F17200 Nicotine dependence, unspecified, uncomplicated: Secondary | ICD-10-CM | POA: Diagnosis present

## 2012-01-12 DIAGNOSIS — F411 Generalized anxiety disorder: Secondary | ICD-10-CM | POA: Diagnosis present

## 2012-01-12 DIAGNOSIS — F419 Anxiety disorder, unspecified: Secondary | ICD-10-CM

## 2012-01-12 DIAGNOSIS — I739 Peripheral vascular disease, unspecified: Secondary | ICD-10-CM | POA: Diagnosis present

## 2012-01-12 DIAGNOSIS — K219 Gastro-esophageal reflux disease without esophagitis: Secondary | ICD-10-CM | POA: Diagnosis present

## 2012-01-12 DIAGNOSIS — I69928 Other speech and language deficits following unspecified cerebrovascular disease: Secondary | ICD-10-CM

## 2012-01-12 DIAGNOSIS — D62 Acute posthemorrhagic anemia: Secondary | ICD-10-CM | POA: Diagnosis not present

## 2012-01-12 DIAGNOSIS — I639 Cerebral infarction, unspecified: Secondary | ICD-10-CM | POA: Diagnosis present

## 2012-01-12 DIAGNOSIS — I10 Essential (primary) hypertension: Secondary | ICD-10-CM | POA: Diagnosis present

## 2012-01-12 HISTORY — PX: AORTA - BILATERAL FEMORAL ARTERY BYPASS GRAFT: SHX1175

## 2012-01-12 LAB — BLOOD GAS, ARTERIAL
Acid-base deficit: 1.9 mmol/L (ref 0.0–2.0)
Bicarbonate: 23.3 mEq/L (ref 20.0–24.0)
Patient temperature: 98.6
TCO2: 24.8 mmol/L (ref 0–100)
pCO2 arterial: 46.8 mmHg — ABNORMAL HIGH (ref 35.0–45.0)
pH, Arterial: 7.319 — ABNORMAL LOW (ref 7.350–7.450)

## 2012-01-12 LAB — BASIC METABOLIC PANEL
Calcium: 7.7 mg/dL — ABNORMAL LOW (ref 8.4–10.5)
GFR calc Af Amer: >90 mL/min (ref >90)
GFR calc non Af Amer: >90 mL/min (ref >90)
Glucose, Bld: 193 mg/dL — ABNORMAL HIGH (ref 70–99)
Potassium: 3.8 mEq/L (ref 3.5–5.1)
Sodium: 140 mEq/L (ref 135–145)

## 2012-01-12 LAB — MAGNESIUM: Magnesium: 1.4 mg/dL — ABNORMAL LOW (ref 1.5–2.5)

## 2012-01-12 LAB — PROTIME-INR: INR: 1.36 (ref 0.00–1.49)

## 2012-01-12 LAB — CBC
MCH: 29 pg (ref 26.0–34.0)
MCHC: 32.6 g/dL (ref 30.0–36.0)
RDW: 13.9 % (ref 11.5–15.5)

## 2012-01-12 LAB — APTT: aPTT: 26 seconds (ref 24–37)

## 2012-01-12 SURGERY — CREATION, BYPASS, ARTERIAL, AORTA TO FEMORAL, BILATERAL, USING GRAFT
Anesthesia: General | Site: Abdomen | Wound class: Clean

## 2012-01-12 MED ORDER — FENTANYL CITRATE 0.05 MG/ML IJ SOLN
INTRAMUSCULAR | Status: DC | PRN
  Administered 2012-01-12: 150 ug via INTRAVENOUS
  Administered 2012-01-12: 100 ug via INTRAVENOUS
  Administered 2012-01-12 (×10): 50 ug via INTRAVENOUS
  Administered 2012-01-12: 100 ug via INTRAVENOUS

## 2012-01-12 MED ORDER — ASPIRIN EC 325 MG PO TBEC
325.0000 mg | DELAYED_RELEASE_TABLET | Freq: Every day | ORAL | Status: DC
  Filled 2012-01-12: qty 1

## 2012-01-12 MED ORDER — LIDOCAINE HCL (CARDIAC) 20 MG/ML IV SOLN
INTRAVENOUS | Status: DC | PRN
  Administered 2012-01-12: 100 mg via INTRAVENOUS

## 2012-01-12 MED ORDER — MORPHINE SULFATE 2 MG/ML IJ SOLN
2.0000 mg | INTRAMUSCULAR | Status: DC | PRN
  Administered 2012-01-12: 4 mg via INTRAVENOUS
  Administered 2012-01-12: 2 mg via INTRAVENOUS
  Administered 2012-01-12 – 2012-01-13 (×3): 4 mg via INTRAVENOUS
  Administered 2012-01-13 (×2): 2 mg via INTRAVENOUS
  Administered 2012-01-13 (×3): 4 mg via INTRAVENOUS
  Administered 2012-01-13: 2 mg via INTRAVENOUS
  Filled 2012-01-12 (×4): qty 2
  Filled 2012-01-12: qty 1
  Filled 2012-01-12 (×3): qty 2
  Filled 2012-01-12 (×3): qty 1

## 2012-01-12 MED ORDER — PHENOL 1.4 % MT LIQD
1.0000 | OROMUCOSAL | Status: DC | PRN
  Administered 2012-01-13: 1 via OROMUCOSAL
  Filled 2012-01-12: qty 177

## 2012-01-12 MED ORDER — METOPROLOL TARTRATE 1 MG/ML IV SOLN: 2.0000 mg | INTRAVENOUS | Status: DC | PRN

## 2012-01-12 MED ORDER — DEXTROSE 5 % IV SOLN
1.5000 g | Freq: Two times a day (BID) | INTRAVENOUS | Status: AC
  Administered 2012-01-12 – 2012-01-13 (×2): 1.5 g via INTRAVENOUS
  Filled 2012-01-12 (×2): qty 1.5

## 2012-01-12 MED ORDER — DOPAMINE-DEXTROSE 3.2-5 MG/ML-% IV SOLN: 3.0000 ug/kg/min | INTRAVENOUS | Status: DC

## 2012-01-12 MED ORDER — LABETALOL HCL 5 MG/ML IV SOLN
10.0000 mg | INTRAVENOUS | Status: DC | PRN
  Administered 2012-01-12 (×2): 10 mg via INTRAVENOUS
  Filled 2012-01-12 (×2): qty 4

## 2012-01-12 MED ORDER — ALBUMIN HUMAN 5 % IV SOLN
INTRAVENOUS | Status: DC | PRN
  Administered 2012-01-12: 10:00:00 via INTRAVENOUS

## 2012-01-12 MED ORDER — LACTATED RINGERS IV SOLN
INTRAVENOUS | Status: DC | PRN
  Administered 2012-01-12 (×4): via INTRAVENOUS

## 2012-01-12 MED ORDER — TEMAZEPAM 15 MG PO CAPS: 15.0000 mg | ORAL_CAPSULE | Freq: Every evening | ORAL | Status: DC | PRN

## 2012-01-12 MED ORDER — HYDROMORPHONE HCL PF 1 MG/ML IJ SOLN
INTRAMUSCULAR | Status: AC
  Filled 2012-01-12: qty 1

## 2012-01-12 MED ORDER — PROTAMINE SULFATE 10 MG/ML IV SOLN
INTRAVENOUS | Status: DC | PRN
  Administered 2012-01-12: 10 mg via INTRAVENOUS
  Administered 2012-01-12: 30 mg via INTRAVENOUS
  Administered 2012-01-12 (×3): 20 mg via INTRAVENOUS

## 2012-01-12 MED ORDER — LABETALOL HCL 5 MG/ML IV SOLN
INTRAVENOUS | Status: DC | PRN
  Administered 2012-01-12: 5 mg via INTRAVENOUS

## 2012-01-12 MED ORDER — KCL IN DEXTROSE-NACL 20-5-0.45 MEQ/L-%-% IV SOLN
INTRAVENOUS | Status: DC
  Administered 2012-01-13 – 2012-01-14 (×3): via INTRAVENOUS
  Administered 2012-01-15: 75 mL/h via INTRAVENOUS
  Filled 2012-01-12 (×6): qty 1000

## 2012-01-12 MED ORDER — GUAIFENESIN-DM 100-10 MG/5ML PO SYRP: 15.0000 mL | ORAL_SOLUTION | ORAL | Status: DC | PRN

## 2012-01-12 MED ORDER — ALUM & MAG HYDROXIDE-SIMETH 200-200-20 MG/5ML PO SUSP: 15.0000 mL | ORAL | Status: DC | PRN

## 2012-01-12 MED ORDER — ONDANSETRON HCL 4 MG/2ML IJ SOLN: 4.0000 mg | Freq: Four times a day (QID) | INTRAMUSCULAR | Status: DC | PRN

## 2012-01-12 MED ORDER — ROCURONIUM BROMIDE 100 MG/10ML IV SOLN
INTRAVENOUS | Status: DC | PRN
  Administered 2012-01-12: 70 mg via INTRAVENOUS
  Administered 2012-01-12: 20 mg via INTRAVENOUS
  Administered 2012-01-12: 10 mg via INTRAVENOUS

## 2012-01-12 MED ORDER — HYDROMORPHONE HCL PF 1 MG/ML IJ SOLN
0.5000 mg | INTRAMUSCULAR | Status: DC | PRN
  Administered 2012-01-12 (×2): 0.5 mg via INTRAVENOUS

## 2012-01-12 MED ORDER — HYDRALAZINE HCL 20 MG/ML IJ SOLN
10.0000 mg | INTRAMUSCULAR | Status: DC | PRN
  Filled 2012-01-12: qty 0.5

## 2012-01-12 MED ORDER — NEOSTIGMINE METHYLSULFATE 1 MG/ML IJ SOLN
INTRAMUSCULAR | Status: DC | PRN
  Administered 2012-01-12: 3 mg via INTRAVENOUS

## 2012-01-12 MED ORDER — DROPERIDOL 2.5 MG/ML IJ SOLN: 0.6250 mg | INTRAMUSCULAR | Status: DC | PRN

## 2012-01-12 MED ORDER — DOCUSATE SODIUM 100 MG PO CAPS
100.0000 mg | ORAL_CAPSULE | Freq: Every day | ORAL | Status: DC
  Administered 2012-01-16 – 2012-01-17 (×2): 100 mg via ORAL
  Filled 2012-01-12 (×2): qty 1

## 2012-01-12 MED ORDER — CHLORHEXIDINE GLUCONATE 0.12 % MT SOLN
15.0000 mL | Freq: Two times a day (BID) | OROMUCOSAL | Status: DC
  Administered 2012-01-12 – 2012-01-15 (×3): 15 mL via OROMUCOSAL
  Filled 2012-01-12 (×4): qty 15

## 2012-01-12 MED ORDER — POTASSIUM CHLORIDE CRYS ER 20 MEQ PO TBCR: 20.0000 meq | EXTENDED_RELEASE_TABLET | Freq: Once | ORAL | Status: AC | PRN

## 2012-01-12 MED ORDER — PROPOFOL 10 MG/ML IV EMUL
INTRAVENOUS | Status: DC | PRN
  Administered 2012-01-12: 50 mg via INTRAVENOUS
  Administered 2012-01-12: 100 mg via INTRAVENOUS
  Administered 2012-01-12: 50 mg via INTRAVENOUS

## 2012-01-12 MED ORDER — MIDAZOLAM HCL 5 MG/5ML IJ SOLN
INTRAMUSCULAR | Status: DC | PRN
  Administered 2012-01-12: 2 mg via INTRAVENOUS

## 2012-01-12 MED ORDER — SODIUM CHLORIDE 0.45 % IV SOLN
INTRAVENOUS | Status: DC
  Administered 2012-01-16: 20 mL/h via INTRAVENOUS

## 2012-01-12 MED ORDER — HYDROCHLOROTHIAZIDE 50 MG PO TABS
50.0000 mg | ORAL_TABLET | Freq: Every day | ORAL | Status: DC
  Administered 2012-01-12: 50 mg via ORAL
  Filled 2012-01-12 (×4): qty 1

## 2012-01-12 MED ORDER — DEXTROSE 5 % IV SOLN
INTRAVENOUS | Status: DC | PRN
  Administered 2012-01-12: 08:00:00 via INTRAVENOUS

## 2012-01-12 MED ORDER — VECURONIUM BROMIDE 10 MG IV SOLR
INTRAVENOUS | Status: DC | PRN
  Administered 2012-01-12: 2 mg via INTRAVENOUS
  Administered 2012-01-12: 1 mg via INTRAVENOUS
  Administered 2012-01-12: 3 mg via INTRAVENOUS
  Administered 2012-01-12: 2 mg via INTRAVENOUS
  Administered 2012-01-12: 3 mg via INTRAVENOUS
  Administered 2012-01-12: 2 mg via INTRAVENOUS
  Administered 2012-01-12: 3 mg via INTRAVENOUS
  Administered 2012-01-12: 1 mg via INTRAVENOUS
  Administered 2012-01-12: 2 mg via INTRAVENOUS
  Administered 2012-01-12: 1 mg via INTRAVENOUS

## 2012-01-12 MED ORDER — SODIUM CHLORIDE 0.9 % IV SOLN
INTRAVENOUS | Status: DC
  Administered 2012-01-12: 12:00:00 via INTRAVENOUS

## 2012-01-12 MED ORDER — SODIUM CHLORIDE 0.9 % IR SOLN
Status: DC | PRN
  Administered 2012-01-12: 09:00:00

## 2012-01-12 MED ORDER — DEXTROSE 5 % IV SOLN: 1.5000 g | INTRAVENOUS | Status: DC | PRN

## 2012-01-12 MED ORDER — ACETAMINOPHEN 325 MG PO TABS: 325.0000 mg | ORAL_TABLET | ORAL | Status: DC | PRN

## 2012-01-12 MED ORDER — ACETAMINOPHEN 650 MG RE SUPP: 325.0000 mg | RECTAL | Status: DC | PRN

## 2012-01-12 MED ORDER — HEPARIN SODIUM (PORCINE) 1000 UNIT/ML IJ SOLN
INTRAMUSCULAR | Status: DC | PRN
  Administered 2012-01-12: 10000 [IU] via INTRAVENOUS
  Administered 2012-01-12: 5000 [IU] via INTRAVENOUS

## 2012-01-12 MED ORDER — MANNITOL 25 % IV SOLN
INTRAVENOUS | Status: DC | PRN
  Administered 2012-01-12: 25 g via INTRAVENOUS

## 2012-01-12 MED ORDER — LISINOPRIL 20 MG PO TABS
20.0000 mg | ORAL_TABLET | Freq: Two times a day (BID) | ORAL | Status: DC
  Administered 2012-01-12: 20 mg via ORAL
  Filled 2012-01-12 (×7): qty 1

## 2012-01-12 MED ORDER — SODIUM CHLORIDE 0.9 % IV SOLN: 500.0000 mL | Freq: Once | INTRAVENOUS | Status: AC | PRN

## 2012-01-12 MED ORDER — KCL IN DEXTROSE-NACL 20-5-0.45 MEQ/L-%-% IV SOLN
INTRAVENOUS | Status: AC
  Filled 2012-01-12: qty 1000

## 2012-01-12 MED ORDER — PANTOPRAZOLE SODIUM 40 MG PO TBEC
40.0000 mg | DELAYED_RELEASE_TABLET | Freq: Every day | ORAL | Status: DC
  Filled 2012-01-12: qty 1

## 2012-01-12 MED ORDER — HYDROMORPHONE HCL PF 1 MG/ML IJ SOLN
0.2500 mg | INTRAMUSCULAR | Status: DC | PRN
  Administered 2012-01-12 (×4): 0.5 mg via INTRAVENOUS

## 2012-01-12 MED ORDER — ASPIRIN 81 MG PO CHEW
324.0000 mg | CHEWABLE_TABLET | Freq: Every day | ORAL | Status: DC
  Administered 2012-01-12: 324 mg
  Filled 2012-01-12: qty 4

## 2012-01-12 MED ORDER — OXYCODONE HCL 5 MG PO TABS
5.0000 mg | ORAL_TABLET | ORAL | Status: DC | PRN
  Administered 2012-01-12 – 2012-01-17 (×3): 10 mg via ORAL
  Filled 2012-01-12 (×3): qty 2

## 2012-01-12 MED ORDER — 0.9 % SODIUM CHLORIDE (POUR BTL) OPTIME
TOPICAL | Status: DC | PRN
  Administered 2012-01-12: 4000 mL
  Administered 2012-01-12: 1000 mL

## 2012-01-12 MED ORDER — ONDANSETRON HCL 4 MG/2ML IJ SOLN
INTRAMUSCULAR | Status: DC | PRN
  Administered 2012-01-12: 4 mg via INTRAVENOUS

## 2012-01-12 MED ORDER — CEFUROXIME SODIUM 1.5 G IJ SOLR: 1.5000 g | INTRAMUSCULAR | Status: DC | PRN

## 2012-01-12 MED ORDER — GLYCOPYRROLATE 0.2 MG/ML IJ SOLN
INTRAMUSCULAR | Status: DC | PRN
  Administered 2012-01-12: 0.6 mg via INTRAVENOUS

## 2012-01-12 MED ORDER — BIOTENE DRY MOUTH MT LIQD
15.0000 mL | Freq: Two times a day (BID) | OROMUCOSAL | Status: DC
  Administered 2012-01-12 – 2012-01-16 (×7): 15 mL via OROMUCOSAL

## 2012-01-12 MED ORDER — PANTOPRAZOLE SODIUM 40 MG PO PACK
40.0000 mg | PACK | Freq: Every day | ORAL | Status: DC
  Administered 2012-01-12: 40 mg
  Filled 2012-01-12 (×3): qty 20

## 2012-01-12 MED ORDER — ASPIRIN EC 325 MG PO TBEC: 325.0000 mg | DELAYED_RELEASE_TABLET | Freq: Every day | ORAL | Status: DC

## 2012-01-12 MED ORDER — PHENYLEPHRINE HCL 10 MG/ML IJ SOLN
10.0000 mg | INTRAVENOUS | Status: DC | PRN
  Administered 2012-01-12: 10 ug/min via INTRAVENOUS

## 2012-01-12 MED ORDER — ATENOLOL 100 MG PO TABS
100.0000 mg | ORAL_TABLET | Freq: Every day | ORAL | Status: DC
  Filled 2012-01-12 (×3): qty 1

## 2012-01-12 SURGICAL SUPPLY — 61 items
ATTRACTOMAT 16X20 MAGNETIC DRP (DRAPES) ×2 IMPLANT
BAG ISOLATION DRAPE 18X18 (DRAPES) ×2 IMPLANT
CANISTER SUCTION 2500CC (MISCELLANEOUS) ×2 IMPLANT
CANNULA VESSEL W/WING WO/VALVE (CANNULA) ×2 IMPLANT
CLIP TI MEDIUM 24 (CLIP) ×4 IMPLANT
CLIP TI WIDE RED SMALL 24 (CLIP) ×2 IMPLANT
CLOTH BEACON ORANGE TIMEOUT ST (SAFETY) ×2 IMPLANT
CONT SPECI 4OZ STER CLIK (MISCELLANEOUS) ×2 IMPLANT
COVER SURGICAL LIGHT HANDLE (MISCELLANEOUS) ×4 IMPLANT
DRAPE ISOLATION BAG 18X18 (DRAPES) ×2
DRAPE WARM FLUID 44X44 (DRAPE) ×2 IMPLANT
DRSG COVADERM 4X8 (GAUZE/BANDAGES/DRESSINGS) ×2 IMPLANT
ELECT BLADE 6.5 EXT (BLADE) ×2 IMPLANT
ELECT REM PT RETURN 9FT ADLT (ELECTROSURGICAL) ×2
ELECTRODE REM PT RTRN 9FT ADLT (ELECTROSURGICAL) ×1 IMPLANT
GAUZE SPONGE 4X4 16PLY XRAY LF (GAUZE/BANDAGES/DRESSINGS) ×2 IMPLANT
GLOVE BIO SURGEON STRL SZ 6.5 (GLOVE) ×16 IMPLANT
GLOVE BIO SURGEON STRL SZ7.5 (GLOVE) ×4 IMPLANT
GLOVE BIOGEL PI IND STRL 6.5 (GLOVE) ×7 IMPLANT
GLOVE BIOGEL PI IND STRL 7.0 (GLOVE) ×1 IMPLANT
GLOVE BIOGEL PI INDICATOR 6.5 (GLOVE) ×7
GLOVE BIOGEL PI INDICATOR 7.0 (GLOVE) ×1
GLOVE ECLIPSE 6.5 STRL STRAW (GLOVE) ×6 IMPLANT
GLOVE SS N UNI LF 7.5 STRL (GLOVE) ×2 IMPLANT
GLOVE SURG SS PI 6.5 STRL IVOR (GLOVE) ×2 IMPLANT
GOWN PREVENTION PLUS XLARGE (GOWN DISPOSABLE) ×2 IMPLANT
GOWN STRL NON-REIN LRG LVL3 (GOWN DISPOSABLE) ×20 IMPLANT
GRAFT HEMASHIELD 16X8MM (Vascular Products) ×2 IMPLANT
INSERT FOGARTY 61MM (MISCELLANEOUS) ×2 IMPLANT
INSERT FOGARTY SM (MISCELLANEOUS) ×4 IMPLANT
KIT BASIN OR (CUSTOM PROCEDURE TRAY) ×2 IMPLANT
KIT ROOM TURNOVER OR (KITS) ×2 IMPLANT
LOOP VESSEL MINI RED (MISCELLANEOUS) ×2 IMPLANT
NS IRRIG 1000ML POUR BTL (IV SOLUTION) ×10 IMPLANT
PACK AORTA (CUSTOM PROCEDURE TRAY) ×2 IMPLANT
PAD ARMBOARD 7.5X6 YLW CONV (MISCELLANEOUS) ×4 IMPLANT
RETAINER VISCERA MED (MISCELLANEOUS) ×4 IMPLANT
SPONGE LAP 18X18 X RAY DECT (DISPOSABLE) ×2 IMPLANT
SPONGE SURGIFOAM ABS GEL 100 (HEMOSTASIS) IMPLANT
STAPLER VISISTAT 35W (STAPLE) ×4 IMPLANT
STRIP PERIGUARD 6X8 (Vascular Products) ×2 IMPLANT
SUT PDS AB 1 TP1 54 (SUTURE) ×4 IMPLANT
SUT PROLENE 3 0 SH DA (SUTURE) IMPLANT
SUT PROLENE 3 0 SH1 36 (SUTURE) ×4 IMPLANT
SUT PROLENE 5 0 C 1 24 (SUTURE) ×10 IMPLANT
SUT PROLENE 6 0 C 1 30 (SUTURE) ×2 IMPLANT
SUT PROLENE 6 0 CC (SUTURE) ×2 IMPLANT
SUT SILK 2 0 TIES 17X18 (SUTURE) ×1
SUT SILK 2 0SH CR/8 30 (SUTURE) ×2 IMPLANT
SUT SILK 2-0 18XBRD TIE BLK (SUTURE) ×1 IMPLANT
SUT SILK 3 0 (SUTURE) ×1
SUT SILK 3 0 TIES 17X18 (SUTURE)
SUT SILK 3-0 18XBRD TIE 12 (SUTURE) ×1 IMPLANT
SUT SILK 3-0 18XBRD TIE BLK (SUTURE) IMPLANT
SUT VIC AB 2-0 CT1 36 (SUTURE) ×4 IMPLANT
SUT VIC AB 3-0 SH 27 (SUTURE) ×4
SUT VIC AB 3-0 SH 27X BRD (SUTURE) ×4 IMPLANT
TOWEL OR 17X24 6PK STRL BLUE (TOWEL DISPOSABLE) ×4 IMPLANT
TOWEL OR 17X26 10 PK STRL BLUE (TOWEL DISPOSABLE) ×4 IMPLANT
TRAY FOLEY CATH 14FRSI W/METER (CATHETERS) ×2 IMPLANT
WATER STERILE IRR 1000ML POUR (IV SOLUTION) ×4 IMPLANT

## 2012-01-12 NOTE — Interval H&P Note (Signed)
History and Physical Interval Note:  01/12/2012 7:35 AM  [name] [name]  has presented today for surgery, with the diagnosis of Peripheral Vascular Disease  The various methods of treatment have been discussed with the patient and family. After consideration of risks, benefits and other options for treatment, the patient has consented to  Procedure(s) (LRB): AORTA BIFEMORAL BYPASS GRAFT (N/A) as a surgical intervention .  The patient's history has been reviewed, patient examined, no change in status, stable for surgery.  I have reviewed the patients' chart and labs.  Questions were answered to the patient's satisfaction.     Margit Batte E

## 2012-01-12 NOTE — Addendum Note (Signed)
Addendum  created 01/12/12 1637 by Germaine Pomfret, MD   Modules edited:Anesthesia LDA

## 2012-01-12 NOTE — H&P (View-Only) (Signed)
Patient returns for followup today. He previously underwent left carotid endarterectomy several weeks ago after a stroke. He has done well from this. He still has residual right arm numbness and slurring of speech secondary to his preoperative stroke. He also has a known infrarenal aortic occlusion. He returns today after CT Angio with runoff for further consideration of aortobifemoral bypass grafting. He was scheduled for cardiology risk stratification. However he recently missed his appointment. This has been rescheduled for early next week. He continues to experience shortness and claudication of both lower extremities. He does not have rest pain. He does not have ulcerations on the feet.  Review of systems: He denies chest pain. He denies shortness of breath.  Physical exam: Filed Vitals:   12/18/11 1102  BP: 182/92  Pulse: 51  Temp: 97 F (36.1 C)  TempSrc: Oral  Resp: 18  Height: 6' 2" (1.88 m)  Weight: 229 lb (103.874 kg)  SpO2: 100%    Lower extremities: Absent femoral popliteal and pedal pulses  Skin: No ulcer or rash  Chest: Clear to auscultation  Cardiac: Regular rate and rhythm  Neck: Well-healed carotid endarterectomy incision  Data: CT angiogram the abdomen and pelvis is reviewed today. The left and right renal arteries are patent. There is an infrarenal aortic occlusion just below the renal arteries. He has reconstitution of the common femoral arteries bilaterally via collaterals. The internal iliac arteries fill retrograde bilaterally. The common femoral profunda femoris superficial femoral popliteal and tibial arteries are patent bilaterally.  Assessment: Infrarenal aortic occlusion.  Plan: Aortobifemoral bypass 01/12/2012. This is contingent on his cardiac risk stratification assessment. Risks benefits possible complications and procedure details of aortobifemoral bypass grafting were explained to the patient and his wife today. These include but are not limited to  graft occlusion, limb loss, renal failure, graft infection, bleeding requiring transfusion. He understands and agrees to proceed.  Johonna Binette, MD [facility] Office: [phone] Pager: [phone]  

## 2012-01-12 NOTE — Progress Notes (Signed)
Dr.Fitzgerald notified of b/p 210/113;no new orders obtained

## 2012-01-12 NOTE — Addendum Note (Signed)
Addendum  created 01/12/12 1643 by Germaine Pomfret, MD   Modules edited:Anesthesia Events, Anesthesia LDA

## 2012-01-12 NOTE — Anesthesia Postprocedure Evaluation (Signed)
Anesthesia Post Note  Patient: [name] [name]  Procedure(s) Performed: Procedure(s) (LRB): AORTA BIFEMORAL BYPASS GRAFT (N/A)  Anesthesia type: General  Patient location: PACU  Post pain: Pain level controlled and Adequate analgesia  Post assessment: Post-op Vital signs reviewed, Patient's Cardiovascular Status Stable, Respiratory Function Stable, Patent Airway and Pain level controlled  Last Vitals:  Filed Vitals:   01/12/12 1600  BP: 132/72  Pulse:   Temp: 36 C  Resp:     Post vital signs: Reviewed and stable  Level of consciousness: awake, alert  and oriented  Complications: No apparent anesthesia complications

## 2012-01-12 NOTE — Progress Notes (Signed)
ANTIBIOTIC CONSULT NOTE - INITIAL  Pharmacy Consult for cefuroxime Indication: post-op prophylaxis  No Known Allergies  Patient Measurements: Weight: 223 lb (101.152 kg) Adjusted Body Weight:   Vital Signs: Temp: 97.4 F (36.3 C) (07/15 1745) BP: 160/104 mmHg (07/15 1745) Pulse Rate: 70  (07/15 1730) Intake/Output from previous day:   Intake/Output from this shift: Total I/O In: 6025 [I.V.:3850; Blood:950; XBJYN:829; IV Piggyback:1000] Out: 3225 [Urine:575; Other:150; Blood:2500]  Labs:  Basename 01/12/12 1600  WBC 14.8*  HGB 11.7*  PLT 135*  LABCREA --  CREATININE 0.83   The CrCl is unknown because both a height and weight (above a minimum accepted value) are required for this calculation. No results found for this basename: VANCOTROUGH:2,VANCOPEAK:2,VANCORANDOM:2,GENTTROUGH:2,GENTPEAK:2,GENTRANDOM:2,TOBRATROUGH:2,TOBRAPEAK:2,TOBRARND:2,AMIKACINPEAK:2,AMIKACINTROU:2,AMIKACIN:2, in the last 72 hours   Microbiology: Recent Results (from the past 720 hour(s))  SURGICAL PCR SCREEN     Status: Normal   Collection Time   01/08/12 12:13 PM      Component Value Range Status Comment   MRSA, PCR NEGATIVE  NEGATIVE Final    Staphylococcus aureus NEGATIVE  NEGATIVE Final     Medical History: Past Medical History  Diagnosis Date  . GERD (gastroesophageal reflux disease)   . Nicotine addiction   . ETOH abuse   . Anxiety   . Hypertension     no pcp     saw peter nisham once  . Stroke 08/11/11    slurred speech    Assessment: 64 YOM s/p aorta-bifemoral bypass graft with orders for pharmacy to adjust antibiotics post-op  Goal of Therapy: NA  Plan:  1. Cefuroxime 1.5gm IV q12h x 2 doses appropriate for renal fx.  Will s/o as no further intervention needed  Dannielle Huh 01/12/2012,6:48 PM

## 2012-01-12 NOTE — Addendum Note (Signed)
Addendum  created 01/12/12 1643 by E. Lorimer Tiberio, MD   Modules edited:Anesthesia Events, Anesthesia LDA    

## 2012-01-12 NOTE — Addendum Note (Signed)
Addendum  created 01/12/12 1646 by Germaine Pomfret, MD   Modules edited:Orders

## 2012-01-12 NOTE — Anesthesia Procedure Notes (Signed)
Procedure Name: Intubation Date/Time: 01/12/2012 8:01 AM Performed by: Julianne Rice K Pre-anesthesia Checklist: Emergency Drugs available, Patient identified, Timeout performed, Suction available and Patient being monitored Patient Re-evaluated:Patient Re-evaluated prior to inductionOxygen Delivery Method: Circle system utilized Preoxygenation: Pre-oxygenation with 100% oxygen Intubation Type: IV induction Ventilation: Mask ventilation without difficulty Laryngoscope Size: Mac and 3 Grade View: Grade II Tube type: Oral Tube size: 8.5 mm Number of attempts: 1 Airway Equipment and Method: Stylet Placement Confirmation: ETT inserted through vocal cords under direct vision,  positive ETCO2 and breath sounds checked- equal and bilateral Secured at: 24 cm Tube secured with: Tape Dental Injury: Teeth and Oropharynx as per pre-operative assessment

## 2012-01-12 NOTE — Anesthesia Preprocedure Evaluation (Addendum)
Anesthesia Evaluation  Patient identified by MRN, date of birth, ID band Patient awake    Reviewed: Allergy & Precautions, H&P , NPO status , Patient's Chart, lab work & pertinent test results, reviewed documented beta blocker date and time   History of Anesthesia Complications Negative for: history of anesthetic complications  Airway Mallampati: II TM Distance: >3 FB Neck ROM: Full    Dental  (+) Missing, Poor Dentition and Dental Advisory Given   Pulmonary Current Smoker,  breath sounds clear to auscultation  Pulmonary exam normal       Cardiovascular hypertension, Pt. on medications and Pt. on home beta blockers + Peripheral Vascular Disease Rhythm:Regular     Neuro/Psych Anxiety CVA, Residual Symptoms    GI/Hepatic GERD-  Medicated and Controlled,(+)     substance abuse  alcohol use and marijuana use,   Endo/Other    Renal/GU negative Renal ROS     Musculoskeletal negative musculoskeletal ROS (+)   Abdominal   Peds  Hematology negative hematology ROS (+)   Anesthesia Other Findings   Reproductive/Obstetrics                          Anesthesia Physical Anesthesia Plan  ASA: III  Anesthesia Plan: General   Post-op Pain Management:    Induction: Intravenous  Airway Management Planned: Oral ETT  Additional Equipment: Arterial line, PA Cath and Ultrasound Guidance Line Placement  Intra-op Plan:   Post-operative Plan: Possible Post-op intubation/ventilation  Informed Consent: I have reviewed the patients History and Physical, chart, labs and discussed the procedure including the risks, benefits and alternatives for the proposed anesthesia with the patient or authorized representative who has indicated his/her understanding and acceptance.   Dental advisory given  Plan Discussed with: Anesthesiologist and Surgeon  Anesthesia Plan Comments:         Anesthesia Quick  Evaluation

## 2012-01-12 NOTE — Op Note (Addendum)
Procedure: Aortobifemoral bypass  Preoperative diagnosis: Claudication bilateral lower extremities  Postoperative diagnosis: Same  Anesthesia: Gen.  Assistant: Newton Pigg PA-C, Della Goo PA-C  Operative findings: #1 proximal anastomosis end to side with 16 x 8 mm dacryon graft  Operative details: After obtaining informed consent, the patient was taken to the operating room. The patient was placed in supine position on the operating table. After induction of general anesthesia and endotracheal ablation the patient was prepped and draped in usual sterile fashion from the nipples to the toes. Next a longitudinal incision was made in the right groin carried into the subcutaneous tissues down to level the right common femoral artery. The artery was dissected free circumferentially. The artery overall was soft. The profunda femoris and superficial femoral arteries were dissected free circumferentially. These were fairly small approximately 3-4 mm in diameter. The common femoral artery was approximately 8 mm in diameter. Several small side branches in the circumflex iliac branches were also dissected free circumferentially and vessel loops placed around these. There is no pulse within the right common femoral artery. A similar incision was made in the left groin. Incision was carried down into the subcutaneous tissues to level left common femoral artery. This was dissected free circumferentially. Several large side branches were dissected free circumferentially and controlled with vessel loops. Superficial femoral and profunda femoris arteries were also dissected free circumferentially and vessel loops placed around these. A vessel loop was also placed around the distal external iliac artery. Retroperitoneal tunnels were started in the groin on both sides with blunt dissection on the dorsal aspect of the artery.  Next a midline laparotomy incision was made extending from the xiphoid to midway  in the umbilicus and pubis. The incision was carried down to the level of the fascia the fascia was incised for the full length of the incision. There were some omental adhesions up the abdominal wall these are taken down with cautery. Next the omentum was reflected superiorly as well as a transverse colon. The small bowel is reflected to the right. The inferior mesenteric vein was ligated and divided between silk ties. The Omni-retractor was brought on to the operative field for assistance in retraction. The retroperitoneum was entered. The inferior mesenteric artery was identified and a vessel loop placed around this. Dissection was carried up on the anterior wall of the aorta up to the level of the left renal vein. Aorta was dissected free on its anterior two thirds surface from this level all the way down to the aortic bifurcation. Several small lumbar arteries were controlled with clips.  Retroperitoneal tunnels were created down the left and right iliac arteries to the groin. This was done a retroureteral retrocolic fashion. An umbilical table placed in the retroperitoneal tunnels.  The patient was given 10000 units of intravenous heparin. After appropriate circulation time, the infrarenal abdominal aorta was clamped just below the inferior mesenteric artery. A vessel loop was used to control the inferior mesenteric artery. An additional aortic clamp was placed just below the renal arteries at the level of the renal vein. A longitudinal opening was made in the aorta just below the renal arteries.  There was a large chunk of thrombus which was removed under direct vision.  A 16 x 8 mm dacron graft was brought onto the operative field and beveled slightly. This was then sewn end of graft to side of artery using a running 3-0 Prolene suture. This was done end to side fashion to preserve pelvic circulation since  the patient has severe external iliac artery occlusive disease. Just prior to completion of the  anastomosis this was thoroughly flushed. Anastomosis was secured, clamps released, and there was good pulsatile flow the proximal graft. There was good hemostasis. The limbs of the graft were then brought out through the retro-peroneal tunnels to each groin. Attention was then turned to the left groin. The distal external iliac artery was controlled with a vessel loop. The common femoral artery profunda femoris and superficial femoral arteries were controlled with vessel loops. A longitudinal opening was made in the common femoral artery the graft was cut to length and beveled and sewn end graft to side of artery using a running 5-0 Prolene suture. Just prior to completion of the anastomosis it was forebled backbled and thoroughly flushed.   The anastomosis was secured, clamps released, and there was good flow into the distal limb immediately. Patient had audible dorsalis pedis and posterior tibial pulse immediately. Hemostasis was obtained in the left groin. Attention was then turned to the right groin. In similar fashion the graft was pulled down and cut to length. A longitudinal opening was made in the right common femoral artery and the graft beveled and sewn end of graft to side of artery using a running 5-0 Prolene suture. Just prior to completion of the anastomosis, it was forebled backbled and thoroughly flushed.   The anastomosis was secured, vessel loops released and there was good pulsatile flow in the right common femoral artery immediately.  Hemostasis was obtained the right groin. The abdomen was inspected. The patient had been given an additional 5000 units of heparin during the course of the case. The patient's heparin was partially reversed with 100 mg of protamine. Hemostasis had been obtained in all incisions at this point. The retroperitoneum was closed with a running 3-0 Vicryl suture with a piece of bovine pericardium to cover the graft since there was minimal tissue for coverage. The viscera  were returned to their normal location and the Omni retractor was removed. The sigmoid colon was inspected and found to be pink with no evidence of ischemia. The fascia was then reapproximated using a running #1 PDS suture. The skin was closed with staples. Each groin was then again inspected and thoroughly irrigated. Both were found to be hemostatic. The deep layers the groins were closed in multiple layers or running 20 and 3 0 Vicryl suture and staples in the skin. Of note an incidental lymph node was sent from the right groin that had some darkish discoloration about. This was sent to pathology as permanent specimen. The patient tolerated procedure well and there were no complications. Sponge and needle counts were correct at the end of the case. The patient was taken to the recovery room after extubation in stable condition.   Fabienne Bruns, MD [facility] Office: [phone] Pager: [phone]

## 2012-01-12 NOTE — Transfer of Care (Signed)
Immediate Anesthesia Transfer of Care Note  Patient: [name] [name]  Procedure(s) Performed: Procedure(s) (LRB): AORTA BIFEMORAL BYPASS GRAFT (N/A)  Patient Location: PACU  Anesthesia Type: General  Level of Consciousness: sedated  Airway & Oxygen Therapy: Patient Spontanous Breathing and Patient connected to face mask oxygen  Post-op Assessment: Report given to PACU RN and Post -op Vital signs reviewed and stable  Post vital signs: Reviewed and stable  Complications: No apparent anesthesia complications

## 2012-01-12 NOTE — Preoperative (Signed)
Beta Blockers   Reason not to administer Beta Blockers:Not Applicable 

## 2012-01-13 ENCOUNTER — Inpatient Hospital Stay (HOSPITAL_COMMUNITY): Payer: Medicaid Other

## 2012-01-13 ENCOUNTER — Encounter (HOSPITAL_COMMUNITY): Payer: Self-pay | Admitting: Internal Medicine

## 2012-01-13 DIAGNOSIS — I10 Essential (primary) hypertension: Secondary | ICD-10-CM

## 2012-01-13 DIAGNOSIS — F17200 Nicotine dependence, unspecified, uncomplicated: Secondary | ICD-10-CM

## 2012-01-13 DIAGNOSIS — I739 Peripheral vascular disease, unspecified: Secondary | ICD-10-CM

## 2012-01-13 DIAGNOSIS — E876 Hypokalemia: Secondary | ICD-10-CM | POA: Diagnosis not present

## 2012-01-13 LAB — CBC
HCT: 34.1 % — ABNORMAL LOW (ref 39.0–52.0)
MCHC: 32.8 g/dL (ref 30.0–36.0)
MCV: 87.9 fL (ref 78.0–100.0)
Platelets: 109 10*3/uL — ABNORMAL LOW (ref 150–400)
RDW: 13.9 % (ref 11.5–15.5)

## 2012-01-13 LAB — COMPREHENSIVE METABOLIC PANEL
AST: 28 U/L (ref 0–37)
Albumin: 3.3 g/dL — ABNORMAL LOW (ref 3.5–5.2)
BUN: 8 mg/dL (ref 6–23)
Creatinine, Ser: 0.74 mg/dL (ref 0.50–1.35)
Total Protein: 5.6 g/dL — ABNORMAL LOW (ref 6.0–8.3)

## 2012-01-13 LAB — MAGNESIUM: Magnesium: 1.4 mg/dL — ABNORMAL LOW (ref 1.5–2.5)

## 2012-01-13 LAB — POCT I-STAT 3, ART BLOOD GAS (G3+)
pCO2 arterial: 39.5 mmHg (ref 35.0–45.0)
pH, Arterial: 7.391 (ref 7.350–7.450)
pO2, Arterial: 80 mmHg (ref 80.0–100.0)

## 2012-01-13 LAB — AMYLASE: Amylase: 30 U/L (ref 0–105)

## 2012-01-13 MED ORDER — DIPHENHYDRAMINE HCL 12.5 MG/5ML PO ELIX
12.5000 mg | ORAL_SOLUTION | Freq: Four times a day (QID) | ORAL | Status: DC | PRN
  Filled 2012-01-13: qty 5

## 2012-01-13 MED ORDER — CLONIDINE HCL 0.2 MG/24HR TD PTWK
0.2000 mg | MEDICATED_PATCH | TRANSDERMAL | Status: DC
  Administered 2012-01-13: 0.2 mg via TRANSDERMAL
  Filled 2012-01-13: qty 1

## 2012-01-13 MED ORDER — PANTOPRAZOLE SODIUM 40 MG IV SOLR
40.0000 mg | INTRAVENOUS | Status: DC
  Administered 2012-01-13 – 2012-01-15 (×3): 40 mg via INTRAVENOUS
  Filled 2012-01-13 (×4): qty 40

## 2012-01-13 MED ORDER — POTASSIUM CHLORIDE 10 MEQ/50ML IV SOLN
10.0000 meq | INTRAVENOUS | Status: DC | PRN
  Administered 2012-01-13 (×2): 10 meq via INTRAVENOUS
  Filled 2012-01-13: qty 50
  Filled 2012-01-13: qty 100

## 2012-01-13 MED ORDER — MORPHINE SULFATE (PF) 1 MG/ML IV SOLN
INTRAVENOUS | Status: DC
  Administered 2012-01-13 (×2): via INTRAVENOUS
  Administered 2012-01-13: 13.6 mg via INTRAVENOUS
  Administered 2012-01-13: 11.19 mg via INTRAVENOUS
  Administered 2012-01-13: 18 mg via INTRAVENOUS
  Administered 2012-01-13: 20:00:00 via INTRAVENOUS
  Administered 2012-01-14: 25.46 mg via INTRAVENOUS
  Administered 2012-01-14: 16.5 mg via INTRAVENOUS
  Administered 2012-01-14: 13.28 mg via INTRAVENOUS
  Administered 2012-01-14: 09:00:00 via INTRAVENOUS
  Administered 2012-01-14: 16 mg via INTRAVENOUS
  Administered 2012-01-14: 7.5 mg via INTRAVENOUS
  Administered 2012-01-14 (×2): via INTRAVENOUS
  Administered 2012-01-14: 19.38 mg via INTRAVENOUS
  Administered 2012-01-15 (×2): via INTRAVENOUS
  Administered 2012-01-15: 33.3 mg via INTRAVENOUS
  Administered 2012-01-15: 03:00:00 via INTRAVENOUS
  Administered 2012-01-15: 16.5 mg via INTRAVENOUS
  Administered 2012-01-15: 12 mg via INTRAVENOUS
  Administered 2012-01-15: 11.85 mg via INTRAVENOUS
  Administered 2012-01-16: 7.5 mg via INTRAVENOUS
  Administered 2012-01-16: 27 mg via INTRAVENOUS
  Administered 2012-01-16: 05:00:00 via INTRAVENOUS
  Administered 2012-01-16: 6.9 mg via INTRAVENOUS
  Administered 2012-01-16: 25 mg via INTRAVENOUS
  Administered 2012-01-16: 18:00:00 via INTRAVENOUS
  Administered 2012-01-17: 9 mg via INTRAVENOUS
  Administered 2012-01-17: 17.67 mL via INTRAVENOUS
  Administered 2012-01-17: 03:00:00 via INTRAVENOUS
  Filled 2012-01-13 (×14): qty 25

## 2012-01-13 MED ORDER — MAGNESIUM SULFATE 40 MG/ML IJ SOLN
2.0000 g | Freq: Once | INTRAMUSCULAR | Status: AC
  Administered 2012-01-13: 2 g via INTRAVENOUS
  Filled 2012-01-13: qty 50

## 2012-01-13 MED ORDER — SODIUM CHLORIDE 0.9 % IJ SOLN: 9.0000 mL | INTRAMUSCULAR | Status: DC | PRN

## 2012-01-13 MED ORDER — POTASSIUM CHLORIDE 10 MEQ/50ML IV SOLN
INTRAVENOUS | Status: AC
  Administered 2012-01-13: 10 meq
  Filled 2012-01-13: qty 50

## 2012-01-13 MED ORDER — DIPHENHYDRAMINE HCL 50 MG/ML IJ SOLN
12.5000 mg | Freq: Four times a day (QID) | INTRAMUSCULAR | Status: DC | PRN
  Administered 2012-01-13: 12.5 mg via INTRAVENOUS
  Administered 2012-01-14 (×2): 25 mg via INTRAVENOUS
  Administered 2012-01-14: 12.5 mg via INTRAVENOUS
  Administered 2012-01-14: 20:00:00 via INTRAVENOUS
  Administered 2012-01-15 – 2012-01-17 (×3): 12.5 mg via INTRAVENOUS
  Filled 2012-01-13 (×8): qty 1

## 2012-01-13 MED ORDER — NALOXONE HCL 0.4 MG/ML IJ SOLN: 0.4000 mg | INTRAMUSCULAR | Status: DC | PRN

## 2012-01-13 MED ORDER — ESMOLOL HCL-SODIUM CHLORIDE 2000 MG/100ML IV SOLN
25.0000 ug/kg/min | INTRAVENOUS | Status: DC
  Administered 2012-01-13 – 2012-01-15 (×4): 25 ug/kg/min via INTRAVENOUS
  Filled 2012-01-13 (×4): qty 100

## 2012-01-13 MED FILL — Heparin Sodium (Porcine) Inj 1000 Unit/ML: INTRAMUSCULAR | Qty: 30 | Status: AC

## 2012-01-13 MED FILL — Sodium Chloride IV Soln 0.9%: INTRAVENOUS | Qty: 1000 | Status: AC

## 2012-01-13 MED FILL — Sodium Chloride Irrigation Soln 0.9%: Qty: 3000 | Status: AC

## 2012-01-13 NOTE — Evaluation (Signed)
Physical Therapy Evaluation Patient Details Name: [name] [name] MRN: [id number] DOB: 1965/12/16 Today's Date: 01/13/2012 Time: 4098-1191 PT Time Calculation (min): 31 min  PT Assessment / Plan / Recommendation Clinical Impression  Patient presents S/P aorto-bifemoral bypass secondary to claudication. He will benefit from PT to ensure that he reaches the maximum level of independence for discharge home.     PT Assessment  Patient needs continued PT services    Follow Up Recommendations  No PT follow up;Supervision - Intermittent    Barriers to Discharge  None      Equipment Recommendations  None recommended by PT    Recommendations for Other Services  None  Frequency Min 3X/week    Precautions / Restrictions Precautions Precautions: Fall   Pertinent Vitals/Pain VSS/ Abdominal pain.       Mobility  Bed Mobility Bed Mobility: Rolling Left;Left Sidelying to Sit;Sitting - Scoot to Edge of Bed Rolling Left: 4: Min assist;With rail Left Sidelying to Sit: 2: Max assist Sitting - Scoot to [place] of Bed: 3: Mod assist Details for Bed Mobility Assistance: Patient educated in sequencing to reduce abdominal pain with the transition. Limited by soreness only.  Transfers Transfers: Sit to Stand;Stand to Sit Sit to Stand: 3: Mod assist;With upper extremity assist;From bed;From elevated surface Stand to Sit: With upper extremity assist;To bed;4: Min assist Details for Transfer Assistance: Education in hand placement to and from device.  Ambulation/Gait Ambulation/Gait Assistance: 4: Min guard Ambulation Distance (Feet): 150 Feet Assistive device: Rolling walker Ambulation/Gait Assistance Details: Verbal cues for upright posture only.  Gait Pattern: Step-through pattern;Trunk flexed;Decreased stride length     PT Diagnosis: Difficulty walking;Acute pain  PT Problem List: Decreased activity tolerance;Decreased mobility;Pain;Decreased knowledge of use of DME PT Treatment Interventions:  Gait training;Stair training;DME instruction;Therapeutic exercise;Therapeutic activities;Patient/family education   PT Goals Acute Rehab PT Goals PT Goal Formulation: With patient Time For Goal Achievement: 01/20/12 Potential to Achieve Goals: Good Pt will go Supine/Side to Sit: with modified independence PT Goal: Supine/Side to Sit - Progress: Goal set today Pt will go Sit to Supine/Side: with modified independence PT Goal: Sit to Supine/Side - Progress: Goal set today Pt will go Sit to Stand: with modified independence;with upper extremity assist PT Goal: Sit to Stand - Progress: Goal set today Pt will go Stand to Sit: with modified independence;with upper extremity assist PT Goal: Stand to Sit - Progress: Goal set today Pt will Ambulate: >150 feet;with modified independence;with least restrictive assistive device PT Goal: Ambulate - Progress: Goal set today Pt will Go Up / Down Stairs: 1-2 stairs;with supervision PT Goal: Up/Down Stairs - Progress: Goal set today  Visit Information  Last PT Received On: 01/13/12 Assistance Needed: +1 (you can take 2 for lines only. )    Subjective Data  Subjective: Patient reports that he is very sore Patient Stated Goal: Home and walk   Prior Functioning  Home Living Lives With: Significant other Available Help at Discharge: Family;Friend(s);Available 24 hours/day Type of Home: Apartment Home Access: Stairs to enter Entrance Stairs-Number of Steps: 1 Entrance Stairs-Rails: None Home Layout: One level Bathroom Shower/Tub: Engineer, manufacturing systems: Standard Home Adaptive Equipment: None Prior Function Level of Independence: Independent Able to Take Stairs?: Yes Communication Communication: Expressive difficulties    Cognition  Overall Cognitive Status: Appears within functional limits for tasks assessed/performed Arousal/Alertness: Awake/alert Orientation Level: Appears intact for tasks assessed Behavior During Session: [facility]  for tasks performed    Extremity/Trunk Assessment Right Lower Extremity Assessment RLE ROM/Strength/Tone: Within functional  levels RLE Coordination: WFL - gross/fine motor Left Lower Extremity Assessment LLE ROM/Strength/Tone: Within functional levels LLE Coordination: WFL - gross/fine motor Trunk Assessment Trunk Assessment: Normal   Balance High Level Balance High Level Balance Comments: Close guarding with walker.  End of Session PT - End of Session Equipment Utilized During Treatment: Gait belt Activity Tolerance: Patient limited by pain Patient left: in chair;with call bell/phone within reach Nurse Communication: Mobility status (Need to re-hook NG tube to suction. )   Edwyna Perfect, PT  Pager [phone]  01/13/2012, 12:21 PM

## 2012-01-13 NOTE — Evaluation (Signed)
Occupational Therapy Evaluation Patient Details Name: [name] [name] MRN: [id number] DOB: [birth year] Today's Date: 01/13/2012 Time: 4098-1191 OT Time Calculation (min): 30 min  OT Assessment / Plan / Recommendation Clinical Impression  Patient presents S/P aorto-bifemoral bypass secondary to claudication. He will benefit from OT to ensure that he reaches the maximum level of independence for discharge home.       OT Assessment  Patient needs continued OT Services    Follow Up Recommendations  Home health OT       Equipment Recommendations  None recommended by OT       Frequency  Min 2X/week    Precautions / Restrictions Precautions Precautions: Fall       ADL  Grooming: Simulated;Wash/dry face;Minimal assistance Where Assessed - Grooming: Unsupported sitting Upper Body Bathing: Simulated;Moderate assistance Where Assessed - Upper Body Bathing: Unsupported sitting Lower Body Bathing: Simulated;Maximal assistance Where Assessed - Lower Body Bathing: Supported sit to stand Upper Body Dressing: Simulated;Minimal assistance Where Assessed - Upper Body Dressing: Unsupported sitting Lower Body Dressing: Simulated;Maximal assistance Where Assessed - Lower Body Dressing: Supported sit to stand Toilet Transfer: Simulated;Other (comment) (sit to stand only at EOB) Toilet Transfer Method: Sit to stand ADL Comments: Pt wil have help with ADL activity. Pt able to sit EOB for approx 15 minutes before returning to bed. Pt doing well and has good family support    OT Diagnosis: Generalized weakness;Acute pain  OT Problem List: Decreased strength;Pain OT Treatment Interventions: Self-care/ADL training;Patient/family education;DME and/or AE instruction   OT Goals Acute Rehab OT Goals OT Goal Formulation: With patient Time For Goal Achievement: 01/27/12 Potential to Achieve Goals: Good ADL Goals Pt Will Perform Grooming: with supervision;Standing at sink ADL Goal: Grooming - Progress:  Goal set today Pt Will Perform Lower Body Dressing: with min assist;Sit to stand from bed ADL Goal: Lower Body Dressing - Progress: Goal set today Pt Will Transfer to Toilet: with supervision;Comfort height toilet ADL Goal: Toilet Transfer - Progress: Goal set today  Visit Information  Last OT Received On: 01/13/12 Assistance Needed: +1 (you can take 2 for lines only. )    Subjective Data  Subjective: i like to joke around   Prior Functioning  Vision/Perception  Home Living Lives With: Significant other Available Help at Discharge: Family;Friend(s);Available 24 hours/day Type of Home: Apartment Home Access: Stairs to enter Entrance Stairs-Number of Steps: 1 Entrance Stairs-Rails: None Home Layout: One level Bathroom Shower/Tub: Engineer, manufacturing systems: Standard Home Adaptive Equipment: None Prior Function Level of Independence: Independent Able to Take Stairs?: Yes Communication Communication: Expressive difficulties Dominant Hand: Right      Cognition  Overall Cognitive Status: Appears within functional limits for tasks assessed/performed Arousal/Alertness: Awake/alert Orientation Level: Appears intact for tasks assessed Behavior During Session: [facility] for tasks performed    Extremity/Trunk Assessment Right Upper Extremity Assessment RUE ROM/Strength/Tone: [facility] for tasks assessed RUE Sensation:  (reports tingling in R arm post old stroke) Left Upper Extremity Assessment LUE ROM/Strength/Tone: WFL for tasks assessed Right Lower Extremity Assessment RLE ROM/Strength/Tone: Within functional [name] RLE Coordination: WFL - gross/fine motor Left Lower Extremity Assessment LLE ROM/Strength/Tone: Within functional [name] LLE Coordination: WFL - gross/fine motor Trunk Assessment Trunk Assessment: Normal   Mobility Bed Mobility Bed Mobility: Rolling Left;Left Sidelying to Sit;Sitting - Scoot to Edge of Bed Rolling Left: 4: Min assist;With rail Left Sidelying to  Sit: 4: Min assist Sitting - Scoot to Edge of Bed: 4: Min assist Details for Bed Mobility Assistance: Patient educated in sequencing to reduce  abdominal pain with the transition. Limited by soreness only.  Transfers Sit to Stand: With upper extremity assist;From bed;From elevated surface;4: Min assist Stand to Sit: With upper extremity assist;To bed;4: Min assist Details for Transfer Assistance: Education in hand placement to and from device.          End of Session OT - End of Session Activity Tolerance: Patient limited by pain Patient left: in bed;with call bell/phone within reach;with family/visitor present       Charlee Whitebread, [place] 01/13/2012, 2:00 PM
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
## 2012-01-13 NOTE — Progress Notes (Addendum)
[facility] Progress Note   CC:  Frustrated this am with staff; states he stood to weigh this am. 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  BMET    Component Value Date/Time   NA 137 01/13/2012 0415   K 3.1* 01/13/2012 0415   CL 103 01/13/2012 0415   CO2 23 01/13/2012 0415   GLUCOSE 182* 01/13/2012 0415   BUN 8 01/13/2012 0415   CREATININE 0.74 01/13/2012 0415   CALCIUM 7.8* 01/13/2012 0415   GFRNONAA >90 01/13/2012 0415   GFRAA >90 01/13/2012 0415     HEME/ID Tm 99.9 now 99.7  CBC    Component Value Date/Time   WBC 12.4* 01/13/2012 0415   RBC 3.88* 01/13/2012 0415   HGB 11.2* 01/13/2012 0415   HCT 34.1* 01/13/2012 0415   PLT 109* 01/13/2012 0415   MCV 87.9 01/13/2012 0415   MCH 28.9 01/13/2012 0415   MCHC 32.8 01/13/2012 0415   RDW 13.9 01/13/2012 0415    Blood Culture No results found for this basename: sdes, specrequest, cult, reptstatus    ABDOMEN:  Distended; non-tender; -BS, -flatus  EXTREMITIES:  Brisk doppler signal bilateral PT/PT; -edema  A/P: 46 y.o. male who is s/p aorto-bifem bypass grafting  POD 1  -doing well -hypertensive this am-will get internal medicine involved for BP control.  Will start esmolol gtt -acute surgical blood loss anemia-tolerating -increased output from NGT-continue NGT and NPO -d/c swan; sleeve -check labs in am -hypokalemia-supplement   Doreatha Massed, PA-C [facility] [phone]  Palpable DP pulses PT pulses bilat feet warm, dressing dry no  hematoma, abdomen soft Hyperglycemia- will check A1C Poorly controlled hypertension-Esmolol drip for now, start clonidine patch Will keep aline for now to assist with esmolol titration Will restart PO BP meds when taking PO but will have medicine assist in optimizing his control which apparently was marginal at home.  Transfer 2000 when off IV BP meds  Fabienne Bruns, MD [facility] [place] Office: [phone] Pager: [phone]

## 2012-01-14 DIAGNOSIS — I70219 Atherosclerosis of native arteries of extremities with intermittent claudication, unspecified extremity: Principal | ICD-10-CM

## 2012-01-14 DIAGNOSIS — F411 Generalized anxiety disorder: Secondary | ICD-10-CM

## 2012-01-14 DIAGNOSIS — K219 Gastro-esophageal reflux disease without esophagitis: Secondary | ICD-10-CM

## 2012-01-14 LAB — CBC
Hemoglobin: 9.9 g/dL — ABNORMAL LOW (ref 13.0–17.0)
MCHC: 32.2 g/dL (ref 30.0–36.0)
Platelets: 98 10*3/uL — ABNORMAL LOW (ref 150–400)
RBC: 3.45 MIL/uL — ABNORMAL LOW (ref 4.22–5.81)

## 2012-01-14 LAB — HEMOGLOBIN A1C
Hgb A1c MFr Bld: 7.1 % — ABNORMAL HIGH (ref <5.7)
Mean Plasma Glucose: 157 mg/dL — ABNORMAL HIGH (ref <117)

## 2012-01-14 LAB — BASIC METABOLIC PANEL
Calcium: 8.2 mg/dL — ABNORMAL LOW (ref 8.4–10.5)
GFR calc non Af Amer: >90 mL/min (ref >90)
Glucose, Bld: 190 mg/dL — ABNORMAL HIGH (ref 70–99)
Potassium: 3.5 mEq/L (ref 3.5–5.1)
Sodium: 136 mEq/L (ref 135–145)

## 2012-01-14 LAB — MAGNESIUM: Magnesium: 2.3 mg/dL (ref 1.5–2.5)

## 2012-01-14 NOTE — Plan of Care (Signed)
Problem: Food- and Nutrition-Related Knowledge Deficit (NB-1.1) Goal: Nutrition education Formal process to instruct or train a patient/client in a skill or to impart knowledge to help patients/clients voluntarily manage or modify food choices and eating behavior to maintain or improve health.  Outcome: Completed/Met Date Met:  01/14/12 RD consult for Heart Healthy diet education/information per patient's significant other. Handouts provided from [employer]: "Heart Healthy Nutrition Therapy", "Sodium-Free Flavoring Tips", "Heart Healthy Cooking Tips." No questions at time of visitation; opted to review materials on her own. RD available for follow-up should questions arise. Patient is NPO; s/p aorta bifemoral bypass graft 7/15. Reports he's hungry; BMI = 28.7 kg/m2. No nutrition intervention warranted at this time. Please consult RD as needed.  Kirkland Hun, RD, LDN Pager #: [phone] After-Hours Pager #: [phone]

## 2012-01-14 NOTE — Care Management Note (Unsigned)
    Page 1 of 1   01/14/2012     11:04:35 AM   CARE MANAGEMENT NOTE 01/14/2012  Patient:  [name] [name],[name] [name]   Account Number:  [id number]  Date Initiated:  01/14/2012  Documentation initiated by:  Westlee Devita  Subjective/Objective Assessment:   PT S/P AORTABIFEMORAL BYPASS GRAFT ON 01/12/12.  PTA, PT INDEPENDENT, LIVES WITH GIRLFRIEND, [name] [name].     Action/Plan:   MET WITH PT AND SIGNIFICANT OTHER TO DISCUSS DC PLANS. [name] [name] CAN PROVIDE 24HR CARE AT DISCHARGE.  PT RECOMMENDS NO HOME FOLLOW UP.  WILL FOLLLOW FOR ADDITIONAL NEEDS.   Anticipated DC Date:  01/17/2012   Anticipated DC Plan:  HOME W HOME HEALTH SERVICES      DC Planning Services  CM consult      Choice offered to / List presented to:             Status of service:  In process, will continue to follow Medicare Important Message given?   (If response is "NO", the following Medicare IM given date fields will be blank) Date Medicare IM given:   Date Additional Medicare IM given:    Discharge Disposition:    Per UR Regulation:    If discussed at Long Length of Stay Meetings, dates discussed:    Comments:

## 2012-01-14 NOTE — Progress Notes (Signed)
Patient ID: [name] [name]  male  ZOX:[id number]    DOB: 08-25-1965    DOA: 01/12/2012  PCP: Oneal Grout, MD                                                           Medicine consult progress note      Subjective: Feeling a lot better today, asking when NGT can come out, still having a lot of output. Pain control, no chest pain no shortness of breath, no fevers or chills.  Objective: Weight change: 2.248 kg (4 lb 15.3 oz)  Intake/Output Summary (Last 24 hours) at 01/14/12 1232 Last data filed at 01/14/12 1000  Gross per 24 hour  Intake 2169.75 ml  Output   2820 ml  Net -650.25 ml   Blood pressure 104/57, pulse 72, temperature 99.1 F (37.3 C), temperature source Oral, resp. rate 14, weight 103.4 kg (227 lb 15.3 oz), SpO2 95.00%.  Physical Exam: General: Alert and awake, oriented x3, not in any acute distress.,  HEENT: anicteric sclera, pupils reactive to light and accommodation, EOMI, NGT to wall suction CVS: S1-S2 clear, no murmur rubs or gallops Chest: clear to auscultation bilaterally, no wheezing, rales or rhonchi Abdomen: soft abdominal incisions with staples intact  Extremities: no cyanosis, clubbing or edema noted bilaterally Neuro: Cranial nerves II-XII intact, no focal neurological deficits  Lab Results: Basic Metabolic Panel:  Lab 01/14/12 8119 01/13/12 0415  NA 136 137  K 3.5 3.1*  CL 101 103  CO2 26 23  GLUCOSE 190* 182*  BUN 8 8  CREATININE 0.89 0.74  CALCIUM 8.2* 7.8*  MG 2.3 --  PHOS -- --   Liver Function Tests:  Lab 01/13/12 0415 01/08/12 1218  AST 28 23  ALT 22 29  ALKPHOS 28* 59  BILITOT 0.4 0.3  PROT 5.6* 7.4  ALBUMIN 3.3* 3.9    Lab 01/13/12 0415  LIPASE --  AMYLASE 30   CBC:  Lab 01/14/12 0410 01/13/12 0415  WBC 10.1 12.4*  NEUTROABS -- --  HGB 9.9* 11.2*  HCT 30.7* 34.1*  MCV 89.0 87.9  PLT 98* 109*   CBG:  Lab 01/12/12 0636  GLUCAP 147*     Micro Results: Recent Results (from the past 240 hour(s))  SURGICAL  PCR SCREEN     Status: Normal   Collection Time   01/08/12 12:13 PM      Component Value Range Status Comment   MRSA, PCR NEGATIVE  NEGATIVE Final    Staphylococcus aureus NEGATIVE  NEGATIVE Final     Studies/Results: Ct Angio Ao+bifem W/cm &/or Wo/cm  12/18/2011  *RADIOLOGY REPORT*  Clinical Data:  Bilateral leg pain with walking.  History of stroke.  CT ANGIOGRAPHY OF ABDOMINAL AORTA WITH ILIOFEMORAL RUNOFF  Technique:  Multidetector CT imaging of the abdomen, pelvis and lower extremities was performed using the standard protocol during bolus administration of intravenous contrast.  Multiplanar CT image reconstructions including MIPs were obtained to evaluate the vascular anatomy.  Contrast: OMNIPAQUE IOHEXOL 350 MG/ML SOLN  Comparison:   None.  Findings:  Aorta:  The upper abdominal aorta is patent.  There is flow in the celiac trunk, superior mesenteric artery and bilateral renal arteries.  There is circumferential plaque in the proximal superior mesenteric artery that appears to be causing greater than  50% narrowing.  The right hepatic artery originates from the superior mesenteric artery.  There is a tiny left accessory renal artery above the main left renal artery.  The main bilateral renal arteries are widely patent.  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  The inferior mesenteric artery has a large caliber and widely patent.  The abdominal aorta occludes distal to the inferior mesenteric artery.  The common iliac arteries are occluded to the level of the iliac bifurcations. There is close to 50% narrowing of the external iliac arteries bilaterally.  Right Lower Extremity:  The inferior epigastric arteries are prominent, particularly on the right side.  The common femoral artery is patent with mild plaque.  The right deep and superficial femoral arteries are patent.  No significant plaque in the right superficial femoral artery.  The right popliteal artery  is widely patent.  There is a three-vessel runoff in the right calf. Dorsalis pedis artery and posterior tibial artery are widely patent across the ankle.  Left Lower Extremity:  Left common femoral artery has a mild amount of plaque.  The left superficial and deep femoral arteries are patent.  The popliteal artery is patent with minimal plaque.  Three- vessel runoff in the left lower extremity.  Dorsalis pedis and posterior tibial artery are patent across the ankle.  Nonvascular structures:  Degenerative changes at the SI joints bilaterally.  Well defined lucency in the posterior left iliac bone on sequence #5, image 131 is nonspecific but probably an incidental finding.  Probable bone island involving the right iliac bone.  The lung bases are clear.  No evidence for free intraperitoneal air.  Limited evaluation of the intra-abdominal organs due to the arterial phase of contrast.  Low attenuation of the liver is most likely related to hepatic steatosis.  Gallbladder is grossly normal.  No gross abnormality to the spleen, pancreas or right adrenal gland.  The left adrenal gland contains two nodules. Largest lesion involves the lateral limb and measures 1.8 x 2.6 cm. Smaller nodule is near the superior aspect of the left adrenal gland and measures up to 1.4 cm.  These nodules are indeterminate on this postcontrast examination.  Probable cortical cyst in the right kidney.  Small lymph nodes scattered throughout the abdominal mesentery.  No gross abnormality to the prostate, seminal vesicles or urinary bladder.  No evidence for free fluid.   Review of the MIP images confirms the above findings.  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  No significant disease involving the outflow or runoff vessels bilaterally.  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  The SMA is at the risk for occlusion or thromboembolic  disease.  Please note that the right hepatic artery originates distal to the narrowed superior mesenteric artery.  Accessory left renal artery supplying the left renal upper pole.  There are two indeterminate left adrenal nodules.  Based on the size of the larger lesion, which measures up to 2.6 cm, recommend more definitive evaluation with MRI of the abdomen.  Hepatic steatosis.  Original Report Authenticated By: Richarda Overlie, M.D.   Dg Chest Portable 1 View  01/13/2012  *RADIOLOGY REPORT*  Clinical Data: Post aortic bypass.  PORTABLE CHEST - 1 VIEW  Comparison: 01/12/2012  Findings: Swan-Ganz catheter and NG tube remain in stable position. Mild cardiomegaly with vascular congestion.  Bibasilar atelectasis present.  No visible effusions or pneumothorax.  IMPRESSION: Cardiomegaly, vascular  congestion and bibasilar atelectasis.  No real change.  Original Report Authenticated By: Cyndie Chime, M.D.   Dg Chest Portable 1 View  01/12/2012  *RADIOLOGY REPORT*  Clinical Data: Postop AAA, line placement.  PORTABLE CHEST - 1 VIEW  Comparison: 08/19/2011.  Findings: Nasogastric tube is followed into the stomach.  Right IJ Swan-Ganz catheter tip projects over the right pulmonary artery. Heart is enlarged, stable.  Lungs are low in volume with minimal left basilar subsegmental atelectasis.  Biapical pleural thickening.  No pleural fluid.  No pneumothorax.  IMPRESSION: Low lung volumes with left lower lobe subsegmental atelectasis.  No pneumothorax.  Original Report Authenticated By: Reyes Ivan, M.D.   Dg Abd Portable 1v  01/12/2012  *RADIOLOGY REPORT*  Clinical Data: Postop (AAA)  PORTABLE ABDOMEN - 1 VIEW  Comparison: CT abdomen pelvis with runoff - 12/18/2011  Findings:  There is an overall paucity of bowel gas without definite evidence of obstruction.  Evaluation for pneumoperitoneum is limited secondary to supine patient positioning largely exclusion of the lower thorax.  No definite pneumatosis or portal  venous gas. Enteric tube terminates over the left upper abdominal quadrant.  Skin staples overlie the midline of the abdomen as well as the bilateral femoral heads.  Surgical clips overlie the lumbar spine.  No acute osseous abnormalities.  IMPRESSION: Paucity of bowel gas without definite evidence of obstruction.  Original Report Authenticated By: Waynard Reeds, M.D.    Medications: Scheduled Meds:   . antiseptic oral rinse  15 mL Mouth Rinse q12n4p  . aspirin  324 mg Per Tube Daily  . atenolol  100 mg Oral Daily  . chlorhexidine  15 mL Mouth Rinse BID  . cloNIDine  0.2 mg Transdermal Weekly  . docusate sodium  100 mg Oral Daily  . hydrochlorothiazide  50 mg Oral Daily  . lisinopril  20 mg Oral BID  . magnesium sulfate 1 - 4 g bolus IVPB  2 g Intravenous Once  . morphine   Intravenous Q4H  . pantoprazole (PROTONIX) IV  40 mg Intravenous Q24H  . DISCONTD: pantoprazole sodium  40 mg Per Tube Q1200   Continuous Infusions:   . sodium chloride Stopped (01/13/12 2000)  . dextrose 5 % and 0.45 % NaCl with KCl 20 mEq/L 75 mL/hr at 01/14/12 1200  . esmolol 25 mcg/kg/min (01/14/12 0945)  . DISCONTD: DOPamine       Assessment/Plan: Active Problems:   Atherosclerosis of native arteries of the extremities with intermittent claudication - Management per primary service, vascular surgery - Status post aorta bifemoral bypass graft on 01/12/2012. Continue aspirin which has been held as the patient is n.p.o. Management per surgery.   Uncontrolled hypertension - Still n.p.o., BP better controlled today, continue clonidine patch, esmolol drip.  - When NGT out, will start on oral medications   GERD (gastroesophageal reflux disease): On IV PPI   Nicotine addiction: Consultation on smoking cessation   Stroke: Continue aspirin   Hypokalemia: Improved   DVT Prophylaxis:? Lovenox/ heparin subcutaneous  Code Status:  Disposition: Per primary service   LOS: 2 days   RAI,RIPUDEEP  M.D. [facility] 01/14/2012, 12:32 PM Pager: [phone]  If 7PM-7AM, please contact night-coverage [email or web address] Password [email or web address]

## 2012-01-14 NOTE — Progress Notes (Addendum)
[facility] Progress Note  01/14/2012 7:36 AM POD 2  Subjective:  States he is feeling great this morning.  Has ambulated in the unit this am.  Tm 99.7 now 99  Cuff pressure:   110s-150s systolic  HR  60s-90s reg  95%RA Esmolol gtt Filed Vitals:   01/14/12 0727  BP:   Pulse:   Temp: 99 F (37.2 C)  Resp:      Physical Exam: Cardiac:  RRR Lungs:  CTAB Abdomen:  Softer this am but still with some distention.  Non tender to palpation.  +BS; +flatus last pm. Incisions:  Abdominal incision is c/d/i with staples in tact.  Groin bandages are c/d/i without drainage Extremities:  + palpable PT pulses bilaterally  CBC    Component Value Date/Time   WBC 10.1 01/14/2012 0410   RBC 3.45* 01/14/2012 0410   HGB 9.9* 01/14/2012 0410   HCT 30.7* 01/14/2012 0410   PLT 98* 01/14/2012 0410   MCV 89.0 01/14/2012 0410   MCH 28.7 01/14/2012 0410   MCHC 32.2 01/14/2012 0410   RDW 14.2 01/14/2012 0410    BMET    Component Value Date/Time   NA 136 01/14/2012 0410   K 3.5 01/14/2012 0410   CL 101 01/14/2012 0410   CO2 26 01/14/2012 0410   GLUCOSE 190* 01/14/2012 0410   BUN 8 01/14/2012 0410   CREATININE 0.89 01/14/2012 0410   CALCIUM 8.2* 01/14/2012 0410   GFRNONAA >90 01/14/2012 0410   GFRAA >90 01/14/2012 0410    INR    Component Value Date/Time   INR 1.36 01/12/2012 1600     Intake/Output Summary (Last 24 hours) at 01/14/12 0736 Last data filed at 01/14/12 0700  Gross per 24 hour  Intake 2473.25 ml  Output   3495 ml  Net -1021.75 ml   -NGT:  500cc  Assessment/Plan:  46 y.o. male is s/p aorto-bifemoral bypass graft  POD 1  -looks much better this am. -acute surgical blood loss anemia-continue to monitor -WBC continues to improve daily -pt does have BS this am, but has increased NGT output-some could be from the water in the mouth sponges. -will d/w Dr. Darrick Penna about starting po's today-may need NGT another day. -continue esmolol gtt until pt able to take po meds for  BP control. -continue to mobilize -? Start lovenox today  Doreatha Massed, [place] [facility] [phone] 01/14/2012 7:36 AM  Agree with above, will need to stay in ICU until off IV BP meds Glucose 180 on several occasions will involve diabetes team Keep NG until flatus or BM   Fabienne Bruns, MD [facility] [place] Office: [phone] Pager: [phone]

## 2012-01-14 NOTE — Progress Notes (Signed)
Physical Therapy Treatment Patient Details Name: [name] [name] MRN: [id number] DOB: 09-27-1965 Today's Date: 01/14/2012 Time: 1191-4782 PT Time Calculation (min): 20 min  PT Assessment / Plan / Recommendation Comments on Treatment Session  Patient with excellent progress. Anticipate need for only one further visit due to progress made.     Follow Up Recommendations  No PT follow up;Supervision - Intermittent    Barriers to Discharge  None      Equipment Recommendations  None recommended by PT    Recommendations for Other Services  None  Frequency Min 3X/week   Plan Discharge plan remains appropriate;Frequency remains appropriate    Precautions / Restrictions Precautions Precautions: None Restrictions Weight Bearing Restrictions: No   Pertinent Vitals/Pain VSS/ Abdominal pain - moderate.    Mobility  Bed Mobility Bed Mobility: Sit to Sidelying Left Sit to Sidelying Left: 6: Modified independent (Device/Increase time) Transfers Sit to Stand: 5: Supervision;With upper extremity assist;From chair/3-in-1 Stand to Sit: 5: Supervision;With upper extremity assist;To bed Details for Transfer Assistance: Correct hand placement to and from walker.  Ambulation/Gait Ambulation/Gait Assistance: 5: Supervision Ambulation Distance (Feet): 350 Feet Assistive device: Rolling walker (and none) Ambulation/Gait Assistance Details: Patient ambulated 1/2 the distance with walker and then practiced without. Verbal cues for upright posture only.  Gait Pattern: Step-through pattern;Decreased stride length;Trunk flexed     PT Goals Acute Rehab PT Goals PT Goal: Sit to Supine/Side - Progress: Met PT Goal: Sit to Stand - Progress: Progressing toward goal PT Goal: Stand to Sit - Progress: Progressing toward goal PT Goal: Ambulate - Progress: Progressing toward goal  Visit Information  Last PT Received On: 01/14/12 Assistance Needed: +1    Subjective Data  Subjective: Patient reports tired  of the tube in his nose Patient Stated Goal: NGT out!   Cognition  Overall Cognitive Status: Appears within functional limits for tasks assessed/performed Arousal/Alertness: Awake/alert Orientation Level: Appears intact for tasks assessed Behavior During Session: [facility] for tasks performed    Balance  High Level Balance High Level Balance Activites: Side stepping;Turns High Level Balance Comments: without device and no imbalance noted.   End of Session PT - End of Session Equipment Utilized During Treatment: Gait belt Activity Tolerance: Patient tolerated treatment well Patient left: in bed;with call bell/phone within reach;with family/visitor present Nurse Communication: Mobility status (Need to re-hook NGT)    Edwyna Perfect, PT  Pager [phone]  01/14/2012, 8:54 AM

## 2012-01-14 NOTE — Progress Notes (Signed)
Occupational Therapy Treatment Patient Details Name: [name] [name] MRN: [id number] DOB: 02/14/1966 Today's Date: 01/14/2012 Time: 1478-2956 OT Time Calculation (min): 9 min  OT Assessment / Plan / Recommendation Comments on Treatment Session Pt progressing well with mobility and ADL.  Pt is anxious to get NG tube out, but understands why it is necessary. Pt has good support at home.  Will continue to follow.    Follow Up Recommendations  No OT follow up    Barriers to Discharge       Equipment Recommendations  None recommended by OT    Recommendations for Other Services    Frequency     Plan Discharge plan needs to be updated    Precautions / Restrictions Precautions Precautions: Fall Precaution Comments: NG tube   Pertinent Vitals/Pain     ADL  Eating/Feeding: NPO Lower Body Bathing: Simulated;Maximal assistance;Other (comment) (educated in use of long handled sponge) Where Assessed - Lower Body Bathing: Unsupported sitting Lower Body Dressing: Performed;Moderate assistance Where Assessed - Lower Body Dressing: Unsupported sitting;Other (comment) (educated in use of reacher, sock aide, long handled shoe hor) Equipment Used: Sock aid;Reacher;Long-handled sponge;Long-handled shoe horn ADL Comments: Pt educated in use o LB AE.  Pt is progressing well and thinks he will be able to perform without equipment or can rely on his girlfriend to assist if necessary.    OT Diagnosis:    OT Problem List:   OT Treatment Interventions:     OT Goals Acute Rehab OT Goals OT Goal Formulation: With patient Time For Goal Achievement: 01/27/12 ADL Goals Pt Will Perform Lower Body Dressing: with min assist;Sit to stand from bed ADL Goal: Lower Body Dressing - Progress: Progressing toward goals Pt Will Perform Tub/Shower Transfer: Tub transfer;with supervision;Ambulation;Other (comment) (determine need for tub equipment) ADL Goal: Tub/Shower Transfer - Progress: Goal set today  Visit  Information  Last OT Received On: 01/14/12 Assistance Needed: +1    Subjective Data      Prior Functioning       Cognition  Overall Cognitive Status: Appears within functional limits for tasks assessed/performed Arousal/Alertness: Awake/alert Orientation Level: Appears intact for tasks assessed Behavior During Session: [facility] for tasks performed    Mobility Bed Mobility Bed Mobility: Rolling Right;Right Sidelying to Sit;Sitting - Scoot to Edge of Bed;Sit to Sidelying Right Rolling Right: 6: Modified independent (Device/Increase time);With rail Right Sidelying to Sit: 6: Modified independent (Device/Increase time);With rails Sitting - Scoot to Edge of Bed: 6: Modified independent (Device/Increase time) Sit to Sidelying Right: 6: Modified independent (Device/Increase time) Details for Bed Mobility Assistance: Instructed pt to roll prior to sitting up to decrease abdominal pain.   Exercises    Balance    End of Session OT - End of Session Activity Tolerance: Patient limited by pain Patient left: in bed;with call bell/phone within reach  GO     Evern Bio 01/14/2012, 2:45 PM [phone]

## 2012-01-15 MED ORDER — HYDROCHLOROTHIAZIDE 25 MG PO TABS
25.0000 mg | ORAL_TABLET | Freq: Every day | ORAL | Status: DC
  Administered 2012-01-16 – 2012-01-17 (×2): 25 mg via ORAL
  Filled 2012-01-15 (×2): qty 1

## 2012-01-15 MED ORDER — METOCLOPRAMIDE HCL 5 MG/ML IJ SOLN
10.0000 mg | Freq: Four times a day (QID) | INTRAMUSCULAR | Status: AC
  Administered 2012-01-15 – 2012-01-16 (×3): 10 mg via INTRAVENOUS
  Filled 2012-01-15 (×4): qty 2

## 2012-01-15 MED ORDER — LISINOPRIL 20 MG PO TABS
20.0000 mg | ORAL_TABLET | Freq: Two times a day (BID) | ORAL | Status: DC
  Administered 2012-01-16 – 2012-01-17 (×3): 20 mg via ORAL
  Filled 2012-01-15 (×6): qty 1

## 2012-01-15 MED ORDER — ATENOLOL 100 MG PO TABS
100.0000 mg | ORAL_TABLET | Freq: Every day | ORAL | Status: DC
  Administered 2012-01-15 – 2012-01-17 (×3): 100 mg via ORAL
  Filled 2012-01-15 (×3): qty 1

## 2012-01-15 MED ORDER — ESMOLOL HCL-SODIUM CHLORIDE 2000 MG/100ML IV SOLN
25.0000 ug/kg/min | INTRAVENOUS | Status: DC
  Filled 2012-01-15 (×2): qty 100

## 2012-01-15 MED ORDER — ASPIRIN 81 MG PO CHEW
324.0000 mg | CHEWABLE_TABLET | Freq: Every day | ORAL | Status: DC
  Administered 2012-01-16 – 2012-01-17 (×2): 324 mg via ORAL
  Filled 2012-01-15 (×2): qty 4

## 2012-01-15 MED ORDER — BISACODYL 10 MG RE SUPP
10.0000 mg | Freq: Every day | RECTAL | Status: DC | PRN
  Administered 2012-01-15: 10 mg via RECTAL
  Filled 2012-01-15: qty 1

## 2012-01-15 NOTE — Progress Notes (Addendum)
[facility] [place]  Progress Note Bypass Surgery  Date of Surgery: 01/12/2012  Procedure(s): AORTA BIFEMORAL BYPASS GRAFT Surgeon: Surgeon(s): [name], MD  3 Days Post-Op  History of Present Illness  [name] [name] is a 46 y.o. male who is S/P Procedure(s): AORTA BIFEMORAL BYPASS GRAFT pain.  The patient's pre-op symptoms of pain  are Improved . Patients pain is well controlled.  He has not passed gas yet, but has burped.  He wants the NG tube out ASAP.  VASC. LAB Studies:    Imaging: No results found.  Significant Diagnostic Studies: CBC Lab Results  Component Value Date   WBC 10.1 01/14/2012   HGB 9.9* 01/14/2012   HCT 30.7* 01/14/2012   MCV 89.0 01/14/2012   PLT 98* 01/14/2012    BMET     Component Value Date/Time   NA 136 01/14/2012 0410   K 3.5 01/14/2012 0410   CL 101 01/14/2012 0410   CO2 26 01/14/2012 0410   GLUCOSE 190* 01/14/2012 0410   BUN 8 01/14/2012 0410   CREATININE 0.89 01/14/2012 0410   CALCIUM 8.2* 01/14/2012 0410   GFRNONAA >90 01/14/2012 0410   GFRAA >90 01/14/2012 0410    COAG Lab Results  Component Value Date   INR 1.36 01/12/2012   INR 1.02 01/08/2012   INR 0.99 08/19/2011   No results found for this basename: PTT    Physical Examination  BP Readings from Last 3 Encounters:  01/15/12 126/73  01/15/12 126/73  01/08/12 159/94   Temp Readings from Last 3 Encounters:  01/15/12 99.6 F (37.6 C) Oral  01/15/12 99.6 F (37.6 C) Oral  01/08/12 97 F (36.1 C)    SpO2 Readings from Last 3 Encounters:  01/15/12 94%  01/15/12 94%  01/08/12 95%   Pulse Readings from Last 3 Encounters:  01/15/12 77  01/15/12 77  01/08/12 56    Pt is A&O x 3 Bilateral extremity: Incision/s is/are clean,dry.intact, and  healing without hematoma, erythema or drainage Limb is warm; with good color  Pulses DP/PT pal bilaterally Incision without drainage or erythema Abdomin is soft, but still  distended   Assessment/Plan: Pt. Doing well Post-op pain is controlled Wounds are clean, dry, intact or healing well PT/OT for ambulation  esmolol for BP until PO's are given Keep NG tube until he has passed gas I ordered Reglan 10 mg IV times 3 doses  [name] [facility] [phone] 01/15/2012 8:49 AM  Agree with above Midline and groin incisions look good Tolerated clears today Ambulating in ICU Likely transfer to floor tomorrow if blood pressure under control with PO meds  Wells Mathius Birkeland

## 2012-01-15 NOTE — Progress Notes (Signed)
[facility] [place]    D/C NG tube and restarted po home medications for BP.  I D/C'd the esmolol, but kept the clonidine patch.  We will ask internal medicine physicians for their input now on BP control.  Mercie Balsley MAUREEN PA-C

## 2012-01-15 NOTE — Progress Notes (Signed)
Physical Therapy Treatment Patient Details Name: [name] [name] MRN: [id number] DOB: Mar 14, 1966 Today's Date: 01/15/2012 Time: 2440-1027 PT Time Calculation (min): 28 min  PT Assessment / Plan / Recommendation Comments on Treatment Session  pt presents with Aorto-bifem BPG.  pt moving well and only needs A with lines and cords.  no skilled PT needs, will sign off.      Follow Up Recommendations  No PT follow up;Supervision - Intermittent    Barriers to Discharge        Equipment Recommendations  None recommended by PT    Recommendations for Other Services    Frequency     Plan All goals met and education completed, patient dischaged from PT services    Precautions / Restrictions Precautions Precautions: Fall Precaution Comments: NG tube Restrictions Weight Bearing Restrictions: No   Pertinent Vitals/Pain Denies pain.      Mobility  Bed Mobility Bed Mobility: Not assessed Transfers Transfers: Sit to Stand;Stand to Sit Sit to Stand: 6: Modified independent (Device/Increase time);With upper extremity assist;From chair/3-in-1 Stand to Sit: 6: Modified independent (Device/Increase time);With upper extremity assist;To chair/3-in-1 Ambulation/Gait Ambulation/Gait Assistance: 6: Modified independent (Device/Increase time) Ambulation Distance (Feet): 500 Feet Assistive device: None Ambulation/Gait Assistance Details: pt demos good safety with ambulation.   Gait Pattern: Within Functional Limits Stairs: No Wheelchair Mobility Wheelchair Mobility: No    Exercises     PT Diagnosis:    PT Problem List:   PT Treatment Interventions:     PT Goals Acute Rehab PT Goals PT Goal: Supine/Side to Sit - Progress: Met PT Goal: Sit to Supine/Side - Progress: Met PT Goal: Sit to Stand - Progress: Met PT Goal: Stand to Sit - Progress: Met PT Goal: Ambulate - Progress: Met PT Goal: Up/Down Stairs - Progress: Discontinued (comment)  Visit Information  Last PT Received On:  01/15/12 Assistance Needed: +1    Subjective Data  Subjective: I'm ready to get rid of these cords!   Cognition  Overall Cognitive Status: Appears within functional limits for tasks assessed/performed Arousal/Alertness: Awake/alert Orientation Level: Appears intact for tasks assessed Behavior During Session: [facility] for tasks performed    Balance  Balance Balance Assessed: No  End of Session PT - End of Session Equipment Utilized During Treatment: Gait belt Activity Tolerance: Patient tolerated treatment well Patient left: in chair;with call bell/phone within reach;with family/visitor present Nurse Communication: Mobility status (and D/C PT)   GP     Sunny Schlein, [place] [phone] 01/15/2012, 12:42 PM

## 2012-01-15 NOTE — Progress Notes (Signed)
Patient ID: [name] [name]  male  [id number]    DOB: 07-Mar-1966    DOA: 01/12/2012  PCP: Oneal Grout, MD                                                           Medicine consult progress note      Subjective: Sitting in the chair, frustrated about his NGT, constipated.     Objective: Weight change:   Intake/Output Summary (Last 24 hours) at 01/15/12 1513 Last data filed at 01/15/12 1500  Gross per 24 hour  Intake 1932.01 ml  Output   1480 ml  Net 452.01 ml   Blood pressure 146/90, pulse 72, temperature 98.5 F (36.9 C), temperature source Oral, resp. rate 18, weight 103.4 kg (227 lb 15.3 oz), SpO2 97.00%.  Physical Exam: General: Alert and awake, oriented x3, not in any acute distress.,  HEENT: anicteric sclera, pupils reactive to light and accommodation, EOMI, NGT  CVS: S1-S2 clear, no murmur rubs or gallops Chest: clear to auscultation bilaterally, no wheezing, rales or rhonchi Abdomen: soft abdominal incisions with staples intact  Extremities: no cyanosis, clubbing or edema noted bilaterally Neuro: Cranial nerves II-XII intact, no focal neurological deficits  Lab Results: Basic Metabolic Panel:  Lab 01/14/12 8119 01/13/12 0415  NA 136 137  K 3.5 3.1*  CL 101 103  CO2 26 23  GLUCOSE 190* 182*  BUN 8 8  CREATININE 0.89 0.74  CALCIUM 8.2* 7.8*  MG 2.3 --  PHOS -- --   Liver Function Tests:  Lab 01/13/12 0415  AST 28  ALT 22  ALKPHOS 28*  BILITOT 0.4  PROT 5.6*  ALBUMIN 3.3*    Lab 01/13/12 0415  LIPASE --  AMYLASE 30   CBC:  Lab 01/14/12 0410 01/13/12 0415  WBC 10.1 12.4*  NEUTROABS -- --  HGB 9.9* 11.2*  HCT 30.7* 34.1*  MCV 89.0 87.9  PLT 98* 109*   CBG:  Lab 01/12/12 0636  GLUCAP 147*     Micro Results: Recent Results (from the past 240 hour(s))  SURGICAL PCR SCREEN     Status: Normal   Collection Time   01/08/12 12:13 PM      Component Value Range Status Comment   MRSA, PCR NEGATIVE  NEGATIVE Final    Staphylococcus  aureus NEGATIVE  NEGATIVE Final     Studies/Results: Ct Angio Ao+bifem W/cm &/or Wo/cm  12/18/2011    IMPRESSION: 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  No significant disease involving the outflow or runoff vessels bilaterally.  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  The SMA is at the risk for occlusion or thromboembolic disease.  Please note that the right hepatic artery originates distal to the narrowed superior mesenteric artery.  Accessory left renal artery supplying the left renal upper pole.  There are two indeterminate left adrenal nodules.  Based on the size of the larger lesion, which measures up to 2.6 cm, recommend more definitive evaluation with MRI of the abdomen.  Hepatic steatosis.  Original Report Authenticated By: Richarda Overlie, M.D.   Dg Chest Portable 1 View  01/13/2012  *RADIOLOGY REPORT*  Clinical Data: Post aortic bypass.  PORTABLE CHEST - 1 VIEW  Comparison: 01/12/2012  Findings: Swan-Ganz catheter and  NG tube remain in stable position. Mild cardiomegaly with vascular congestion.  Bibasilar atelectasis present.  No visible effusions or pneumothorax.  IMPRESSION: Cardiomegaly, vascular congestion and bibasilar atelectasis.  No real change.  Original Report Authenticated By: Cyndie Chime, M.D.   Dg Chest Portable 1 View  01/12/2012  *RADIOLOGY REPORT*  Clinical Data: Postop AAA, line placement.  PORTABLE CHEST - 1 VIEW  Comparison: 08/19/2011.  Findings: Nasogastric tube is followed into the stomach.  Right IJ Swan-Ganz catheter tip projects over the right pulmonary artery. Heart is enlarged, stable.  Lungs are low in volume with minimal left basilar subsegmental atelectasis.  Biapical pleural thickening.  No pleural fluid.  No pneumothorax.  IMPRESSION: Low lung volumes with left lower lobe subsegmental atelectasis.  No pneumothorax.  Original Report Authenticated By: Reyes Ivan, M.D.   Dg Abd Portable 1v  01/12/2012  *RADIOLOGY REPORT*  Clinical Data: Postop (AAA)  PORTABLE ABDOMEN - 1 VIEW  Comparison: CT abdomen pelvis with runoff - 12/18/2011  Findings:  There is an overall paucity of bowel gas without definite evidence of obstruction.  Evaluation for pneumoperitoneum is limited secondary to supine patient positioning largely exclusion of the lower thorax.  No definite pneumatosis or portal venous gas. Enteric tube terminates over the left upper abdominal quadrant.  Skin staples overlie the midline of the abdomen as well as the bilateral femoral heads.  Surgical clips overlie the lumbar spine.  No acute osseous abnormalities.  IMPRESSION: Paucity of bowel gas without definite evidence of obstruction.  Original Report Authenticated By: Waynard Reeds, M.D.    Medications: Scheduled Meds:    . antiseptic oral rinse  15 mL Mouth Rinse q12n4p  . aspirin  324 mg Per Tube Daily  . atenolol  100 mg Oral Daily  . chlorhexidine  15 mL Mouth Rinse BID  . cloNIDine  0.2 mg Transdermal Weekly  . docusate sodium  100 mg Oral Daily  . hydrochlorothiazide  25 mg Oral Daily  . lisinopril  20 mg Oral BID  . metoCLOPramide (REGLAN) injection  10 mg Intravenous Q6H  . morphine   Intravenous Q4H  . pantoprazole (PROTONIX) IV  40 mg Intravenous Q24H  . DISCONTD: hydrochlorothiazide  50 mg Oral Daily   Continuous Infusions:    . sodium chloride Stopped (01/13/12 2000)  . DISCONTD: dextrose 5 % and 0.45 % NaCl with KCl 20 mEq/L 75 mL/hr (01/15/12 0138)  . DISCONTD: esmolol 25 mcg/kg/min (01/15/12 0800)  . DISCONTD: esmolol 25 mcg/kg/min (01/15/12 1200)     Assessment/Plan: Active Problems:   Atherosclerosis of native arteries of the extremities with intermittent claudication - Management per primary service, vascular surgery - Status post aorta bifemoral bypass graft on 01/12/2012. Continue aspirin, change to oral - Management per vascular surgery.    Uncontrolled hypertension - NGT out today by vascular surgery, patient started on clear liquid diet, DC esmolol drip, continue clonidine patch today - will start oral meds today. Patient may not need clonidine patch at the time of discharge. Will follow up in a.m. to adjust medications as needed.   GERD (gastroesophageal reflux disease): On IV PPI   Nicotine addiction: Consultation on smoking cessation   Stroke: Continue aspirin   Hypokalemia: Improved   DVT Prophylaxis:? Lovenox/ heparin subcutaneous  Code Status:  Disposition: Per primary service   LOS: 3 days   RAI,RIPUDEEP M.D. [facility] 01/15/2012, 3:13 PM Pager: [phone]  If 7PM-7AM, please contact night-coverage [email or web address] Password [email or web address]

## 2012-01-16 ENCOUNTER — Encounter (HOSPITAL_COMMUNITY): Payer: Self-pay

## 2012-01-16 DIAGNOSIS — E876 Hypokalemia: Secondary | ICD-10-CM

## 2012-01-16 DIAGNOSIS — E119 Type 2 diabetes mellitus without complications: Secondary | ICD-10-CM

## 2012-01-16 LAB — GLUCOSE, CAPILLARY
Glucose-Capillary: 156 mg/dL — ABNORMAL HIGH (ref 70–99)
Glucose-Capillary: 105 mg/dL — ABNORMAL HIGH (ref 70–99)

## 2012-01-16 LAB — POCT I-STAT 7, (LYTES, BLD GAS, ICA,H+H)
Acid-Base Excess: 1 mmol/L (ref 0.0–2.0)
Calcium, Ion: 1.07 mmol/L — ABNORMAL LOW (ref 1.12–1.23)
O2 Saturation: 100 %
Patient temperature: 35.6
Potassium: 3.7 mEq/L (ref 3.5–5.1)
Sodium: 138 mEq/L (ref 135–145)
TCO2: 27 mmol/L (ref 0–100)
pCO2 arterial: 39 mmHg (ref 35.0–45.0)

## 2012-01-16 LAB — HEMOGLOBIN A1C: Mean Plasma Glucose: 148 mg/dL — ABNORMAL HIGH (ref <117)

## 2012-01-16 MED ORDER — INSULIN ASPART 100 UNIT/ML ~~LOC~~ SOLN
0.0000 [IU] | Freq: Three times a day (TID) | SUBCUTANEOUS | Status: DC
  Administered 2012-01-16: 3 [IU] via SUBCUTANEOUS
  Administered 2012-01-17: 2 [IU] via SUBCUTANEOUS

## 2012-01-16 MED ORDER — SODIUM CHLORIDE 0.45 % IV SOLN
INTRAVENOUS | Status: DC
  Administered 2012-01-17: 03:00:00 via INTRAVENOUS

## 2012-01-16 MED ORDER — PANTOPRAZOLE SODIUM 40 MG PO TBEC
40.0000 mg | DELAYED_RELEASE_TABLET | Freq: Every day | ORAL | Status: DC
  Administered 2012-01-16 – 2012-01-17 (×2): 40 mg via ORAL

## 2012-01-16 MED ORDER — GLIPIZIDE 5 MG PO TABS
5.0000 mg | ORAL_TABLET | Freq: Every day | ORAL | Status: DC
  Administered 2012-01-17: 5 mg via ORAL
  Filled 2012-01-16 (×4): qty 1

## 2012-01-16 MED ORDER — ZOLPIDEM TARTRATE 5 MG PO TABS
10.0000 mg | ORAL_TABLET | Freq: Every evening | ORAL | Status: DC | PRN
  Administered 2012-01-16: 10 mg via ORAL
  Filled 2012-01-16: qty 2

## 2012-01-16 MED ORDER — INSULIN ASPART 100 UNIT/ML ~~LOC~~ SOLN: 0.0000 [IU] | Freq: Every day | SUBCUTANEOUS | Status: DC

## 2012-01-16 MED ORDER — LIVING WELL WITH DIABETES BOOK
Freq: Once | Status: AC
  Administered 2012-01-16: 15:00:00
  Filled 2012-01-16: qty 1

## 2012-01-16 NOTE — Progress Notes (Signed)
[facility] [place]  Post-op  Intra-abdominal Surgery note  Date of Surgery: 01/12/2012 Surgeon: Surgeon(s): [name], MD POD: 4 Days Post-Op Procedure(s): AORTA BIFEMORAL BYPASS GRAFT  History of Present Illness  [name] [name] is a 46 y.o. male who is  up s/p Procedure(s): AORTA BIFEMORAL BYPASS GRAFT Pt is doing well. denies incisional pain; denies nausea/vomiting; denies diarrhea. has had flatus;has had BM  IMAGING: No results found.  Significant Diagnostic Studies: CBC Lab Results  Component Value Date   WBC 10.1 01/14/2012   HGB 9.9* 01/14/2012   HCT 30.7* 01/14/2012   MCV 89.0 01/14/2012   PLT 98* 01/14/2012    BMET    Component Value Date/Time   NA 136 01/14/2012 0410   K 3.5 01/14/2012 0410   CL 101 01/14/2012 0410   CO2 26 01/14/2012 0410   GLUCOSE 190* 01/14/2012 0410   BUN 8 01/14/2012 0410   CREATININE 0.89 01/14/2012 0410   CALCIUM 8.2* 01/14/2012 0410   GFRNONAA >90 01/14/2012 0410   GFRAA >90 01/14/2012 0410    COAG Lab Results  Component Value Date   INR 1.36 01/12/2012   INR 1.02 01/08/2012   INR 0.99 08/19/2011   No results found for this basename: PTT    I/O last 3 completed shifts: In: 1978.4 [I.V.:1904.4; NG/GT:60; IV Piggyback:14] Out: 2230 [Urine:1730; Emesis/NG output:500]    Physical Examination BP Readings from Last 3 Encounters:  01/16/12 120/79  01/16/12 120/79  01/08/12 159/94   Temp Readings from Last 3 Encounters:  01/16/12 98.4 F (36.9 C) Oral  01/16/12 98.4 F (36.9 C) Oral  01/08/12 97 F (36.1 C)    SpO2 Readings from Last 3 Encounters:  01/16/12 98%  01/16/12 98%  01/08/12 95%   Pulse Readings from Last 3 Encounters:  01/16/12 55  01/16/12 55  01/08/12 56    General: A&O x 3, WDWN male in NAD Pulmonary: normal non-labored breathing , without Rales, rhonchi,  wheezing Cardiac: Heart rate : regular ,  Abdomen:abdomen soft, non-tender and normal active bowel sounds Abdominal  wound:clean, dry, intact with min serous drainage near umbilicus  Neurologic: A&O X 3; Appropriate Affect ; SENSATION: normal; MOTOR FUNCTION:  moving all extremities equally. Speech is fluent/normal  Vascular Exam:BLE warm and well perfused Extremities without ischemic changes, no Gangrene, no cellulitis; no open wounds;  Non-Invasive Vascular Imaging ABI'S: pending  Assessment/Plan: [name] [name] is a 46 y.o. male who is 4 Days Post-Op Procedure(s): AORTA BIFEMORAL BYPASS GRAFT Tolerated diet well Ambulating Acute blood loss anemia - stable Transfer to 2000  [name] [phone] 01/16/2012 7:22 AM

## 2012-01-16 NOTE — Progress Notes (Signed)
Patient ID: [name] [name]  male  [id number]    DOB: 21-Mar-1966    DOA: 01/12/2012  PCP: Oneal Grout, MD                                                           Medicine consult progress note      Subjective: Sitting in the chair, happy today, NGT out, started on diet.    Objective: Weight change:   Intake/Output Summary (Last 24 hours) at 01/16/12 0946 Last data filed at 01/16/12 0900  Gross per 24 hour  Intake 797.25 ml  Output   1175 ml  Net -377.75 ml   Blood pressure 135/76, pulse 64, temperature 98.8 F (37.1 C), temperature source Oral, resp. rate 16, height 6\' 2"  (1.88 m), weight 102.967 kg (227 lb), SpO2 99.00%.  Physical Exam: General: Alert and awake, oriented x3, not in any acute distress.,  HEENT: anicteric sclera, pupils reactive to light and accommodation, EOMI CVS: S1-S2 clear, no murmur rubs or gallops Chest: CTAB, no wheezing, rales or rhonchi Abdomen: soft abdominal incisions with staples intact  Extremities: no cyanosis, clubbing or edema noted bilaterally   Lab Results: Basic Metabolic Panel:  Lab 01/14/12 5621 01/13/12 0415  NA 136 137  K 3.5 3.1*  CL 101 103  CO2 26 23  GLUCOSE 190* 182*  BUN 8 8  CREATININE 0.89 0.74  CALCIUM 8.2* 7.8*  MG 2.3 --  PHOS -- --   Liver Function Tests:  Lab 01/13/12 0415  AST 28  ALT 22  ALKPHOS 28*  BILITOT 0.4  PROT 5.6*  ALBUMIN 3.3*    Lab 01/13/12 0415  LIPASE --  AMYLASE 30   CBC:  Lab 01/14/12 0410 01/13/12 0415  WBC 10.1 12.4*  NEUTROABS -- --  HGB 9.9* 11.2*  HCT 30.7* 34.1*  MCV 89.0 87.9  PLT 98* 109*   CBG:  Lab 01/12/12 0636  GLUCAP 147*     Micro Results: Recent Results (from the past 240 hour(s))  SURGICAL PCR SCREEN     Status: Normal   Collection Time   01/08/12 12:13 PM      Component Value Range Status Comment   MRSA, PCR NEGATIVE  NEGATIVE Final    Staphylococcus aureus NEGATIVE  NEGATIVE Final     Studies/Results: Ct Angio Ao+bifem W/cm &/or  Wo/cm  12/18/2011    IMPRESSION: 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  No significant disease involving the outflow or runoff vessels bilaterally.  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  The SMA is at the risk for occlusion or thromboembolic disease.  Please note that the right hepatic artery originates distal to the narrowed superior mesenteric artery.  Accessory left renal artery supplying the left renal upper pole.  There are two indeterminate left adrenal nodules.  Based on the size of the larger lesion, which measures up to 2.6 cm, recommend more definitive evaluation with MRI of the abdomen.  Hepatic steatosis.  Original Report Authenticated By: Richarda Overlie, M.D.   Dg Chest Portable 1 View  01/13/2012  *RADIOLOGY REPORT*  Clinical Data: Post aortic bypass.  PORTABLE CHEST - 1 VIEW  Comparison: 01/12/2012  Findings: Swan-Ganz catheter and NG tube remain in stable position. Mild cardiomegaly with  vascular congestion.  Bibasilar atelectasis present.  No visible effusions or pneumothorax.  IMPRESSION: Cardiomegaly, vascular congestion and bibasilar atelectasis.  No real change.  Original Report Authenticated By: Cyndie Chime, M.D.   Dg Chest Portable 1 View  01/12/2012  *RADIOLOGY REPORT*  Clinical Data: Postop AAA, line placement.  PORTABLE CHEST - 1 VIEW  Comparison: 08/19/2011.  Findings: Nasogastric tube is followed into the stomach.  Right IJ Swan-Ganz catheter tip projects over the right pulmonary artery. Heart is enlarged, stable.  Lungs are low in volume with minimal left basilar subsegmental atelectasis.  Biapical pleural thickening.  No pleural fluid.  No pneumothorax.  IMPRESSION: Low lung volumes with left lower lobe subsegmental atelectasis.  No pneumothorax.  Original Report Authenticated By: Reyes Ivan, M.D.   Dg Abd Portable 1v  01/12/2012  *RADIOLOGY REPORT*  Clinical Data:  Postop (AAA)  PORTABLE ABDOMEN - 1 VIEW  Comparison: CT abdomen pelvis with runoff - 12/18/2011  Findings:  There is an overall paucity of bowel gas without definite evidence of obstruction.  Evaluation for pneumoperitoneum is limited secondary to supine patient positioning largely exclusion of the lower thorax.  No definite pneumatosis or portal venous gas. Enteric tube terminates over the left upper abdominal quadrant.  Skin staples overlie the midline of the abdomen as well as the bilateral femoral heads.  Surgical clips overlie the lumbar spine.  No acute osseous abnormalities.  IMPRESSION: Paucity of bowel gas without definite evidence of obstruction.  Original Report Authenticated By: Waynard Reeds, M.D.    Medications: Scheduled Meds:    . antiseptic oral rinse  15 mL Mouth Rinse q12n4p  . aspirin  324 mg Oral Daily  . atenolol  100 mg Oral Daily  . cloNIDine  0.2 mg Transdermal Weekly  . docusate sodium  100 mg Oral Daily  . hydrochlorothiazide  25 mg Oral Daily  . lisinopril  20 mg Oral BID  . metoCLOPramide (REGLAN) injection  10 mg Intravenous Q6H  . morphine   Intravenous Q4H  . pantoprazole (PROTONIX) IV  40 mg Intravenous Q24H  . DISCONTD: aspirin  324 mg Per Tube Daily  . DISCONTD: atenolol  100 mg Oral Daily  . DISCONTD: chlorhexidine  15 mL Mouth Rinse BID  . DISCONTD: hydrochlorothiazide  50 mg Oral Daily  . DISCONTD: lisinopril  20 mg Oral BID   Continuous Infusions:    . sodium chloride 20 mL/hr (01/16/12 0050)  . DISCONTD: dextrose 5 % and 0.45 % NaCl with KCl 20 mEq/L 75 mL/hr (01/15/12 0138)  . DISCONTD: esmolol 25 mcg/kg/min (01/15/12 1200)     Assessment/Plan: Active Problems:   Atherosclerosis of native arteries of the extremities with intermittent claudication - Management per primary service, vascular surgery - Status post aorta bifemoral bypass graft on 01/12/2012. Continue PO aspirin - Management per vascular surgery.   Uncontrolled  hypertension - Better controlled, off esmolol drip - Restarted on oral antihypertensives    GERD (gastroesophageal reflux disease): DC IV PPI, change to oral   Nicotine addiction: counselled on smoking cessation   Stroke: Continue aspirin   Hypokalemia: Improved  Diabetes mellitus: New diagnosis, hemoglobin A1c 7.1 - Started on moderate sliding scale with HS coverage. As it is a new diagnosis, patient is going to need extensive diabetic education, nutrition/dietary, diabetic coordinator consult.   - He can be started on oral hypoglycemics for outpatient however he will need to check his blood sugars daily and follow up with his PCP for adjustment in  meds.  - I will start him on low dose of glipizide for now.   DVT Prophylaxis:? Lovenox/ heparin subcutaneous  Code Status: Full  Disposition: Per primary service   LOS: 4 days   Alphonza Tramell M.D. [facility] 01/16/2012, 9:46 AM Pager: [phone]  If 7PM-7AM, please contact night-coverage [email or web address] Password [email or web address]

## 2012-01-16 NOTE — Progress Notes (Signed)
Inpatient Diabetes Program Recommendations  AACE/ADA: New Consensus Statement on Inpatient Glycemic Control (2009)  Target Ranges:  Prepandial:   less than 140 mg/dL      Peak postprandial:   less than 180 mg/dL (1-2 hours)      Critically ill patients:  140 - 180 mg/dL   Reason for Visit: New onset diabetes.  A1C=7.1%. Will order diabetes survival skills education while in the hospital including diabetes videos, diabetes booklet, etc.       Note: Spoke to patient and girlfriend.  Gave them information regarding diabetes videos.  Briefly described signs and symptoms of diabetes, hypoglycemia, and discussed importance of follow-up with PCP.  Left meter Rx. For MD to sign.  Girlfriend states that she will be calling to get appointment with new PCP.  Will also order outpatient diabetes education per protocol for follow-up in 4-6 weeks. Patient verbalized understanding.

## 2012-01-16 NOTE — Progress Notes (Signed)
Nutrition Education Note  RD consulted for nutrition education regarding diabetes.   Lab Results  Component Value Date   HGBA1C 7.1* 01/14/2012    RD provided "Carbohydrate Counting for Diabetes" handout from the Academy of [employer]. Discussed different food groups and their effects on blood sugar, emphasizing carbohydrate-containing foods. Provided list of carbohydrates and recommended serving sizes of common foods.  Expect fair compliance.  Current diet order is Carbohydrate Modified Medium Calorie, patient is consuming approximately 100% of meals at this time. Labs and medications reviewed. No further nutrition interventions warranted at this time. If additional nutrition issues arise, please re-consult RD.  Kirkland Hun, RD, LDN Pager #: [phone] After-Hours Pager #: [phone]

## 2012-01-17 MED ORDER — GLIPIZIDE 5 MG PO TABS: 5.0000 mg | ORAL_TABLET | Freq: Every day | ORAL | Status: DC

## 2012-01-17 MED ORDER — CLONIDINE HCL 0.2 MG/24HR TD PTWK: 1.0000 | MEDICATED_PATCH | TRANSDERMAL | Status: DC

## 2012-01-17 MED ORDER — TEMAZEPAM 15 MG PO CAPS: 15.0000 mg | ORAL_CAPSULE | Freq: Every evening | ORAL | Status: DC | PRN

## 2012-01-17 MED ORDER — HYDROCHLOROTHIAZIDE 25 MG PO TABS: 25.0000 mg | ORAL_TABLET | Freq: Every day | ORAL | Status: DC

## 2012-01-17 MED ORDER — OXYCODONE HCL 5 MG PO TABS: 5.0000 mg | ORAL_TABLET | Freq: Four times a day (QID) | ORAL | Status: DC | PRN

## 2012-01-17 MED ORDER — LIVING WELL WITH DIABETES BOOK
Freq: Once | Status: AC
  Administered 2012-01-17: 09:00:00
  Filled 2012-01-17: qty 1

## 2012-01-17 NOTE — Progress Notes (Signed)
Patient ID: [name] [name]  male  ZOX:[id number]    DOB: December 06, 1965    DOA: 01/12/2012  PCP: Oneal Grout, MD                                                           Medicine consult progress note      Subjective: Patient seen and examined earlier this morning, doing well, tolerating diet, blood pressure controlled.    Objective: Weight change:   Intake/Output Summary (Last 24 hours) at 01/17/12 1130 Last data filed at 01/17/12 0600  Gross per 24 hour  Intake    542 ml  Output      0 ml  Net    542 ml   Blood pressure 108/67, pulse 59, temperature 97.7 F (36.5 C), temperature source Oral, resp. rate 18, height 6\' 2"  (1.88 m), weight 102.967 kg (227 lb), SpO2 96.00%.  Physical Exam: General: Alert and awake, oriented x3, not in any acute distress.,  HEENT: anicteric sclera, pupils reactive to light and accommodation, EOMI CVS: S1-S2 clear, no murmur rubs or gallops Chest: CTAB, no wheezing, rales or rhonchi Abdomen: soft abdominal incisions with staples intact  Extremities: no cyanosis, clubbing or edema noted bilaterally   Lab Results: Basic Metabolic Panel:  Lab 01/14/12 8119 01/13/12 0415  NA 136 137  K 3.5 3.1*  CL 101 103  CO2 26 23  GLUCOSE 190* 182*  BUN 8 8  CREATININE 0.89 0.74  CALCIUM 8.2* 7.8*  MG 2.3 --  PHOS -- --   Liver Function Tests:  Lab 01/13/12 0415  AST 28  ALT 22  ALKPHOS 28*  BILITOT 0.4  PROT 5.6*  ALBUMIN 3.3*    Lab 01/13/12 0415  LIPASE --  AMYLASE 30   CBC:  Lab 01/14/12 0410 01/13/12 0415  WBC 10.1 12.4*  NEUTROABS -- --  HGB 9.9* 11.2*  HCT 30.7* 34.1*  MCV 89.0 87.9  PLT 98* 109*   CBG:  Lab 01/17/12 0626 01/16/12 2143 01/16/12 1640 01/16/12 1421 01/12/12 0636  GLUCAP 146* 127* 105* 156* 147*     Micro Results: Recent Results (from the past 240 hour(s))  SURGICAL PCR SCREEN     Status: Normal   Collection Time   01/08/12 12:13 PM      Component Value Range Status Comment   MRSA, PCR NEGATIVE   NEGATIVE Final    Staphylococcus aureus NEGATIVE  NEGATIVE Final     Studies/Results: Ct Angio Ao+bifem W/cm &/or Wo/cm  12/18/2011    IMPRESSION: 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  No significant disease involving the outflow or runoff vessels bilaterally.  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  The SMA is at the risk for occlusion or thromboembolic disease.  Please note that the right hepatic artery originates distal to the narrowed superior mesenteric artery.  Accessory left renal artery supplying the left renal upper pole.  There are two indeterminate left adrenal nodules.  Based on the size of the larger lesion, which measures up to 2.6 cm, recommend more definitive evaluation with MRI of the abdomen.  Hepatic steatosis.  Original Report Authenticated By: Richarda Overlie, M.D.   Dg Chest Portable 1 View  01/13/2012  *RADIOLOGY REPORT*  Clinical Data: Post aortic  bypass.  PORTABLE CHEST - 1 VIEW  Comparison: 01/12/2012  Findings: Swan-Ganz catheter and NG tube remain in stable position. Mild cardiomegaly with vascular congestion.  Bibasilar atelectasis present.  No visible effusions or pneumothorax.  IMPRESSION: Cardiomegaly, vascular congestion and bibasilar atelectasis.  No real change.  Original Report Authenticated By: Cyndie Chime, M.D.   Dg Chest Portable 1 View  01/12/2012  *RADIOLOGY REPORT*  Clinical Data: Postop AAA, line placement.  PORTABLE CHEST - 1 VIEW  Comparison: 08/19/2011.  Findings: Nasogastric tube is followed into the stomach.  Right IJ Swan-Ganz catheter tip projects over the right pulmonary artery. Heart is enlarged, stable.  Lungs are low in volume with minimal left basilar subsegmental atelectasis.  Biapical pleural thickening.  No pleural fluid.  No pneumothorax.  IMPRESSION: Low lung volumes with left lower lobe subsegmental atelectasis.  No pneumothorax.  Original  Report Authenticated By: Reyes Ivan, M.D.   Dg Abd Portable 1v  01/12/2012  *RADIOLOGY REPORT*  Clinical Data: Postop (AAA)  PORTABLE ABDOMEN - 1 VIEW  Comparison: CT abdomen pelvis with runoff - 12/18/2011  Findings:  There is an overall paucity of bowel gas without definite evidence of obstruction.  Evaluation for pneumoperitoneum is limited secondary to supine patient positioning largely exclusion of the lower thorax.  No definite pneumatosis or portal venous gas. Enteric tube terminates over the left upper abdominal quadrant.  Skin staples overlie the midline of the abdomen as well as the bilateral femoral heads.  Surgical clips overlie the lumbar spine.  No acute osseous abnormalities.  IMPRESSION: Paucity of bowel gas without definite evidence of obstruction.  Original Report Authenticated By: Waynard Reeds, M.D.    Medications: Scheduled Meds:    . antiseptic oral rinse  15 mL Mouth Rinse q12n4p  . aspirin  324 mg Oral Daily  . atenolol  100 mg Oral Daily  . cloNIDine  0.2 mg Transdermal Weekly  . docusate sodium  100 mg Oral Daily  . glipiZIDE  5 mg Oral QAC breakfast  . hydrochlorothiazide  25 mg Oral Daily  . insulin aspart  0-15 Units Subcutaneous TID WC  . insulin aspart  0-5 Units Subcutaneous QHS  . lisinopril  20 mg Oral BID  . living well with diabetes book   Does not apply Once  . living well with diabetes book   Does not apply Once  . morphine   Intravenous Q4H  . pantoprazole  40 mg Oral Q0600   Continuous Infusions:    . sodium chloride 20 mL/hr at 01/17/12 0254  . DISCONTD: sodium chloride 20 mL/hr (01/16/12 0050)     Assessment/Plan: Active Problems:   Atherosclerosis of native arteries of the extremities with intermittent claudication - Management per primary service, vascular surgery - Status post aorta bifemoral bypass graft on 01/12/2012. Continue PO aspirin - Management per vascular surgery.   Uncontrolled hypertension - Better controlled,  off esmolol drip, restarted oral antihypertensives  - Patient does not need clonidine patch upon DC, he can resume all his outpatient antihypertensives and follow up with his cardiologist, Dr. Eden Emms   GERD (gastroesophageal reflux disease): On oral PPI   Nicotine addiction: counselled on smoking cessation   Stroke: Continue aspirin   Hypokalemia: Improved  Diabetes mellitus: New diagnosis, hemoglobin A1c 7.1 - Continue Glucotrol, I explained the patient this morning, to keep a log of his blood sugars and follow up with his primary care physician in next 7-10 days for adjustment and titration of his medications.  DVT Prophylaxis:? Lovenox/ heparin subcutaneous  Code Status: Full  Disposition: Per primary service, ok to DC from medical standpoint.   LOS: 5 days   RAI,RIPUDEEP M.D. [facility] 01/17/2012, 11:30 AM Pager: [phone]  If 7PM-7AM, please contact night-coverage [email or web address] Password [email or web address]

## 2012-01-17 NOTE — Discharge Summary (Signed)
[facility]ists Discharge Summary   Patient ID:  [name] [name] MRN: [id number] DOB/AGE: 1965/09/02 46 y.o.  Admit date: 01/12/2012 Discharge date: 01/17/2012 Date of Surgery: 01/12/2012 Surgeon: Surgeon(s): [name], MD  Admission Diagnosis: Peripheral Vascular Disease  Discharge Diagnoses:  Peripheral Vascular Disease  Secondary Diagnoses: Past Medical History  Diagnosis Date  . GERD (gastroesophageal reflux disease)   . Nicotine addiction   . ETOH abuse   . Anxiety   . Hypertension     no pcp     saw peter nisham once  . Stroke 08/11/11    slurred speech    Procedure(s): AORTA BIFEMORAL BYPASS GRAFT  Discharged Condition: good  HPI: Patient returns for followup today. He previously underwent left carotid endarterectomy several weeks ago after a stroke. He has done well from this. He still has residual right arm numbness and slurring of speech secondary to his preoperative stroke. He also has a known infrarenal aortic occlusion. He returns today after CT Angio with runoff for further consideration of aortobifemoral bypass grafting. He was scheduled for cardiology risk stratification. However he recently missed his appointment. This has been rescheduled for early next week. He continues to experience shortness and claudication of both lower extremities. He does not have rest pain. He does not have ulcerations on the feet.    Hospital Course:  [name] [name] is a 46 y.o. male is S/P Bilateral Procedure(s): AORTA BIFEMORAL BYPASS GRAFT Extubated: POD # 0 Post-op wounds clean, dry, intact or healing well Pt. Ambulating, voiding and taking PO diet without difficulty. Pt pain controlled with PO pain meds. Labs as below Complications: BP control and DM type 2  Consults: Oneal Grout, MD  RD consulted for nutrition education regarding diabetes  Oneida Arenas, RN Diabetes Coordinator      Significant Diagnostic Studies: CBC Lab Results    Component Value Date   WBC 10.1 01/14/2012   HGB 9.9* 01/14/2012   HCT 30.7* 01/14/2012   MCV 89.0 01/14/2012   PLT 98* 01/14/2012    BMET    Component Value Date/Time   NA 136 01/14/2012 0410   K 3.5 01/14/2012 0410   CL 101 01/14/2012 0410   CO2 26 01/14/2012 0410   GLUCOSE 190* 01/14/2012 0410   BUN 8 01/14/2012 0410   CREATININE 0.89 01/14/2012 0410   CALCIUM 8.2* 01/14/2012 0410   GFRNONAA >90 01/14/2012 0410   GFRAA >90 01/14/2012 0410   COAG Lab Results  Component Value Date   INR 1.36 01/12/2012   INR 1.02 01/08/2012   INR 0.99 08/19/2011     Disposition:  Discharge to :Home Discharge Orders    Future Appointments: Provider: Department: Dept Phone: Center:   03/11/2012 2:00 PM [facility] [phone] [facility]     Future Orders Please Complete By Expires   Ambulatory referral to Nutrition and Diabetic Education      Comments:   Please call patient in 2-4 weeks for appointment.  Recent surgery.  New onset diabetes.  A1C=7.1%.   Resume previous diet      Driving Restrictions      Comments:   No driving for 2 weeks   Lifting restrictions      Comments:   No lifting for 6 weeks   Call MD for:  temperature >100.5      Call MD for:  redness, tenderness, or signs of infection (pain, swelling, bleeding, redness, odor or green/yellow discharge around incision site)      Call  MD for:  severe or increased pain, loss or decreased feeling  in affected limb(s)      Increase activity slowly      Comments:   Walk with assistance use walker or cane as needed   May shower       Scheduling Instructions:   Tomorrow with soap and water      Asa, Baudoin  Home Medication Instructions [id number]   Printed on:01/17/12 0906  Medication Information                    aspirin 325 MG EC tablet Take 325 mg by mouth daily.           lisinopril (PRINIVIL,ZESTRIL) 20 MG tablet Take 1 tablet (20 mg total) by mouth 2 (two) times daily.           CINNAMON PO Take 500 mg by  mouth 2 (two) times daily.            atenolol (TENORMIN) 100 MG tablet Take 1 tablet (100 mg total) by mouth daily.           hydrochlorothiazide (HYDRODIURIL) 50 MG tablet Take 1 tablet (50 mg total) by mouth daily.           cloNIDine (CATAPRES - DOSED IN MG/24 HR) 0.2 mg/24hr patch Place 1 patch (0.2 mg total) onto the skin once a week.           glipiZIDE (GLUCOTROL) 5 MG tablet Take 1 tablet (5 mg total) by mouth daily before breakfast.           hydrochlorothiazide (HYDRODIURIL) 25 MG tablet Take 1 tablet (25 mg total) by mouth daily.           oxyCODONE (OXY IR/ROXICODONE) 5 MG immediate release tablet Take 1-2 tablets (5-10 mg total) by mouth every 6 (six) hours as needed.           temazepam (RESTORIL) 15 MG capsule Take 1 capsule (15 mg total) by mouth at bedtime as needed and may repeat dose one time if needed for sleep.            Verbal and written Discharge instructions given to the patient. Wound care per Discharge AVS   Signed: [name] [facility] 01/17/2012, 9:06 AM    Agree with above

## 2012-01-17 NOTE — Progress Notes (Addendum)
[facility] OF [place]  Progress Note Bypass Surgery  Date of Surgery: 01/12/2012  Procedure(s): AORTA BIFEMORAL BYPASS GRAFT Surgeon: Surgeon(s): [name], MD  5 Days Post-Op  History of Present Illness  [name] [name] is a 46 y.o. male who is S/P Procedure(s): AORTA BIFEMORAL BYPASS GRAFT fem-fem.  The patient's pre-op symptoms of pain are Improved . Patients pain is well controlled.       Imaging: No results found.  Significant Diagnostic Studies: CBC Lab Results  Component Value Date   WBC 10.1 01/14/2012   HGB 9.9* 01/14/2012   HCT 30.7* 01/14/2012   MCV 89.0 01/14/2012   PLT 98* 01/14/2012    BMET     Component Value Date/Time   NA 136 01/14/2012 0410   K 3.5 01/14/2012 0410   CL 101 01/14/2012 0410   CO2 26 01/14/2012 0410   GLUCOSE 190* 01/14/2012 0410   BUN 8 01/14/2012 0410   CREATININE 0.89 01/14/2012 0410   CALCIUM 8.2* 01/14/2012 0410   GFRNONAA >90 01/14/2012 0410   GFRAA >90 01/14/2012 0410    COAG Lab Results  Component Value Date   INR 1.36 01/12/2012   INR 1.02 01/08/2012   INR 0.99 08/19/2011   No results found for this basename: PTT    Physical Examination  BP Readings from Last 3 Encounters:  01/17/12 108/67  01/17/12 108/67  01/08/12 159/94   Temp Readings from Last 3 Encounters:  01/17/12 97.7 F (36.5 C) Oral  01/17/12 97.7 F (36.5 C) Oral  01/08/12 97 F (36.1 C)    SpO2 Readings from Last 3 Encounters:  01/17/12 96%  01/17/12 96%  01/08/12 95%   Pulse Readings from Last 3 Encounters:  01/17/12 59  01/17/12 59  01/08/12 56    Pt is A&O x 3 Central abdominal extremity: Incision/s is/are clean,dry.intact, and  healing without hematoma, erythema or drainage Limb is warm; with good color  Bilateral Dorsalis Pedis pulse is palpable BilateralPosterior tibial pulse is  palpable    Assessment/Plan: Pt. Doing well Post-op pain is controlled Wounds are clean, dry, intact or healing well PT/OT for  ambulation He will f/u as an out patient with his primary care doctor for his BP and DM.  [name] [facility] [phone] 01/17/2012 8:51 AM        Agree with above Incisions healing nicely Abdomen soft nontender with positive bowel movements Ambulating well without help He desires to be discharged  Plan DC home today and followup with Dr. Darrick Penna in 2 weeks

## 2012-01-19 ENCOUNTER — Telehealth: Payer: Self-pay | Admitting: Vascular Surgery

## 2012-01-19 NOTE — Telephone Encounter (Addendum)
Message copied by Shari Prows on Mon Jan 19, 2012  3:35 PM ------      Message from: Melene Plan      Created: Mon Jan 19, 2012 11:34 AM                   ----- Message -----         From: Lars Mage, PA         Sent: 01/17/2012   9:11 AM           To: Melene Plan, RN            Aorta bi-fem-fem bypass.  F/U with Dr. Darrick Penna in 2 weeks for staple removal and eval. I scheduled an appt for this pt on 01/29/12 at 9:30am w/ [facility]. I left voice message for pt and also mailed letter. awt

## 2012-01-22 ENCOUNTER — Emergency Department (HOSPITAL_COMMUNITY)
Admission: EM | Admit: 2012-01-22 | Discharge: 2012-01-22 | Disposition: A | Discharge status: Home / Self Care | Payer: Medicaid Other | Attending: Vascular Surgery | Admitting: Vascular Surgery

## 2012-01-22 ENCOUNTER — Encounter (HOSPITAL_COMMUNITY): Admission: EM | Disposition: A | Discharge status: Home / Self Care | Payer: Self-pay | Source: Home / Self Care

## 2012-01-22 ENCOUNTER — Encounter (HOSPITAL_COMMUNITY): Payer: Self-pay | Admitting: *Deleted

## 2012-01-22 ENCOUNTER — Observation Stay (HOSPITAL_COMMUNITY)
Admission: AD | Admit: 2012-01-22 | Discharge: 2012-01-23 | Disposition: A | Discharge status: Home / Self Care | Payer: Medicaid Other | Source: Ambulatory Visit | Attending: Vascular Surgery | Admitting: Vascular Surgery

## 2012-01-22 ENCOUNTER — Encounter: Payer: Self-pay | Admitting: Neurosurgery

## 2012-01-22 ENCOUNTER — Ambulatory Visit (INDEPENDENT_AMBULATORY_CARE_PROVIDER_SITE_OTHER): Payer: Medicaid Other | Admitting: Neurosurgery

## 2012-01-22 VITALS — BP 126/84 | HR 69 | Temp 97.2°F | Resp 16 | Ht 74.0 in | Wt 216.7 lb

## 2012-01-22 DIAGNOSIS — F1010 Alcohol abuse, uncomplicated: Secondary | ICD-10-CM | POA: Insufficient documentation

## 2012-01-22 DIAGNOSIS — Y832 Surgical operation with anastomosis, bypass or graft as the cause of abnormal reaction of the patient, or of later complication, without mention of misadventure at the time of the procedure: Secondary | ICD-10-CM | POA: Insufficient documentation

## 2012-01-22 DIAGNOSIS — F17200 Nicotine dependence, unspecified, uncomplicated: Secondary | ICD-10-CM | POA: Insufficient documentation

## 2012-01-22 DIAGNOSIS — T8131XA Disruption of external operation (surgical) wound, not elsewhere classified, initial encounter: Principal | ICD-10-CM | POA: Insufficient documentation

## 2012-01-22 DIAGNOSIS — I739 Peripheral vascular disease, unspecified: Secondary | ICD-10-CM | POA: Insufficient documentation

## 2012-01-22 DIAGNOSIS — Z4801 Encounter for change or removal of surgical wound dressing: Secondary | ICD-10-CM | POA: Insufficient documentation

## 2012-01-22 DIAGNOSIS — I69920 Aphasia following unspecified cerebrovascular disease: Secondary | ICD-10-CM | POA: Insufficient documentation

## 2012-01-22 DIAGNOSIS — K219 Gastro-esophageal reflux disease without esophagitis: Secondary | ICD-10-CM | POA: Insufficient documentation

## 2012-01-22 DIAGNOSIS — F411 Generalized anxiety disorder: Secondary | ICD-10-CM | POA: Insufficient documentation

## 2012-01-22 LAB — CBC
HCT: 31.4 % — ABNORMAL LOW (ref 39.0–52.0)
RBC: 3.61 MIL/uL — ABNORMAL LOW (ref 4.22–5.81)
RDW: 13.8 % (ref 11.5–15.5)
WBC: 9.9 10*3/uL (ref 4.0–10.5)

## 2012-01-22 LAB — CREATININE, SERUM
GFR calc Af Amer: 78 mL/min — ABNORMAL LOW (ref >90)
GFR calc non Af Amer: 67 mL/min — ABNORMAL LOW (ref >90)

## 2012-01-22 SURGERY — REPAIR, DEHISCENCE, WOUND, ABDOMEN
Anesthesia: General

## 2012-01-22 MED ORDER — LABETALOL HCL 5 MG/ML IV SOLN
10.0000 mg | INTRAVENOUS | Status: DC | PRN
  Filled 2012-01-22: qty 4

## 2012-01-22 MED ORDER — OXYCODONE-ACETAMINOPHEN 5-325 MG PO TABS
1.0000 | ORAL_TABLET | ORAL | Status: DC | PRN
  Administered 2012-01-22: 2 via ORAL
  Filled 2012-01-22: qty 2

## 2012-01-22 MED ORDER — ASPIRIN EC 325 MG PO TBEC
325.0000 mg | DELAYED_RELEASE_TABLET | Freq: Every day | ORAL | Status: DC
  Administered 2012-01-23: 325 mg via ORAL
  Filled 2012-01-22 (×2): qty 1

## 2012-01-22 MED ORDER — ATENOLOL 100 MG PO TABS
100.0000 mg | ORAL_TABLET | Freq: Every day | ORAL | Status: DC
  Administered 2012-01-23: 100 mg via ORAL
  Filled 2012-01-22 (×2): qty 1

## 2012-01-22 MED ORDER — HYDRALAZINE HCL 20 MG/ML IJ SOLN
10.0000 mg | INTRAMUSCULAR | Status: DC | PRN
  Filled 2012-01-22: qty 0.5

## 2012-01-22 MED ORDER — SODIUM CHLORIDE 0.9 % IJ SOLN
3.0000 mL | Freq: Two times a day (BID) | INTRAMUSCULAR | Status: DC
  Administered 2012-01-22: 3 mL via INTRAVENOUS

## 2012-01-22 MED ORDER — PHENOL 1.4 % MT LIQD
1.0000 | OROMUCOSAL | Status: DC | PRN
  Filled 2012-01-22: qty 177

## 2012-01-22 MED ORDER — ACETAMINOPHEN 650 MG RE SUPP: 325.0000 mg | RECTAL | Status: DC | PRN

## 2012-01-22 MED ORDER — HYDROCHLOROTHIAZIDE 50 MG PO TABS
50.0000 mg | ORAL_TABLET | Freq: Every day | ORAL | Status: DC
  Administered 2012-01-23: 50 mg via ORAL
  Filled 2012-01-22 (×2): qty 1

## 2012-01-22 MED ORDER — GLIPIZIDE 5 MG PO TABS
5.0000 mg | ORAL_TABLET | Freq: Every day | ORAL | Status: DC
  Administered 2012-01-23: 5 mg via ORAL
  Filled 2012-01-22 (×2): qty 1

## 2012-01-22 MED ORDER — LISINOPRIL 20 MG PO TABS
20.0000 mg | ORAL_TABLET | Freq: Two times a day (BID) | ORAL | Status: DC
  Administered 2012-01-22 – 2012-01-23 (×2): 20 mg via ORAL
  Filled 2012-01-22 (×3): qty 1

## 2012-01-22 MED ORDER — GUAIFENESIN-DM 100-10 MG/5ML PO SYRP: 15.0000 mL | ORAL_SOLUTION | ORAL | Status: DC | PRN

## 2012-01-22 MED ORDER — ACETAMINOPHEN 325 MG PO TABS: 325.0000 mg | ORAL_TABLET | ORAL | Status: DC | PRN

## 2012-01-22 MED ORDER — POTASSIUM CHLORIDE CRYS ER 20 MEQ PO TBCR: 20.0000 meq | EXTENDED_RELEASE_TABLET | Freq: Once | ORAL | Status: DC

## 2012-01-22 MED ORDER — SODIUM CHLORIDE 0.9 % IV SOLN: 250.0000 mL | INTRAVENOUS | Status: DC | PRN

## 2012-01-22 MED ORDER — METOPROLOL TARTRATE 1 MG/ML IV SOLN: 2.0000 mg | INTRAVENOUS | Status: DC | PRN

## 2012-01-22 MED ORDER — PANTOPRAZOLE SODIUM 40 MG PO TBEC: 40.0000 mg | DELAYED_RELEASE_TABLET | Freq: Every day | ORAL | Status: DC

## 2012-01-22 MED ORDER — OXYCODONE-ACETAMINOPHEN 5-325 MG PO TABS: 1.0000 | ORAL_TABLET | Freq: Four times a day (QID) | ORAL | Status: DC | PRN

## 2012-01-22 MED ORDER — ENOXAPARIN SODIUM 40 MG/0.4ML ~~LOC~~ SOLN
40.0000 mg | SUBCUTANEOUS | Status: DC
  Filled 2012-01-22 (×2): qty 0.4

## 2012-01-22 MED ORDER — ONDANSETRON HCL 4 MG/2ML IJ SOLN: 4.0000 mg | Freq: Four times a day (QID) | INTRAMUSCULAR | Status: DC | PRN

## 2012-01-22 MED ORDER — SODIUM CHLORIDE 0.9 % IJ SOLN: 3.0000 mL | INTRAMUSCULAR | Status: DC | PRN

## 2012-01-22 MED ORDER — ASPIRIN EC 325 MG PO TBEC: 325.0000 mg | DELAYED_RELEASE_TABLET | Freq: Every day | ORAL | Status: DC

## 2012-01-22 MED ORDER — ALUM & MAG HYDROXIDE-SIMETH 200-200-20 MG/5ML PO SUSP: 15.0000 mL | ORAL | Status: DC | PRN

## 2012-01-22 NOTE — H&P (Signed)
[facility]ist of [place] History and physical examination  Patient name: [name] [name] MRN: [id number] DOB: July 06, 1965 Sex: male  CC: bleeding from abdominal incision  HPI: [name] [name] is a 46 y.o. male with the infrarenal aortic occlusion. He presented with progressive disabling claudication. He underwent aortobifemoral bypass graft on 01/12/2012. He was seen in the office today and his staples were removed from his abdominal incision and every other staple was removed from his groins. He apparently had a small amount of serous drainage from the left groin.  The patient noticed later today some separation of the inferior aspect of this incision. He went to [facility] emergency department where they told him to get in his car and drive to [facility]. En route to [facility] his girlfriend reported a large amount of bloody drainage from the wound and also felt that there was intestine as they could be visualized. They drove  emergently to [place]. I examined the wound in triage and did not see any evidence of evisceration. There was some serous drainage on the dressing and some adipose tissue but no exposed intestine.  Past Medical History  Diagnosis Date  . GERD (gastroesophageal reflux disease)   . Nicotine addiction   . ETOH abuse   . Anxiety   . Hypertension     no pcp     saw peter nisham once  . Stroke 08/11/11    slurred speech  . Peripheral vascular disease     Family History  Problem Relation Age of Onset  . Hypertension Mother     SOCIAL HISTORY: History  Substance Use Topics  . Smoking status: Current Everyday Smoker -- 0.1 packs/day for 25 years    Types: Cigarettes  . Smokeless tobacco: Never Used  . Alcohol Use: 20.0 oz/week    40 drink(s) per week     reports that he has not had any alcohol since February 2013    No Known Allergies  Current Facility-Administered Medications  Medication Dose Route Frequency Provider Last Rate Last Dose  . aspirin EC  tablet 325 mg  325 mg Oral Daily Chuck Hint, MD      . atenolol (TENORMIN) tablet 100 mg  100 mg Oral Daily Chuck Hint, MD      . glipiZIDE (GLUCOTROL) tablet 5 mg  5 mg Oral QAC breakfast Chuck Hint, MD      . hydrochlorothiazide (HYDRODIURIL) tablet 50 mg  50 mg Oral Daily Chuck Hint, MD      . lisinopril (PRINIVIL,ZESTRIL) tablet 20 mg  20 mg Oral BID Chuck Hint, MD        REVIEW OF SYSTEMS: Arly.Keller ] denotes positive finding; [  ] denotes negative finding CARDIOVASCULAR:  [ ]  chest pain   [ ]  chest pressure   [ ]  palpitations   [ ]  orthopnea   [ ]  dyspnea on exertion   [ ]  claudication   [ ]  rest pain   [ ]  DVT   [ ]  phlebitis PULMONARY:   [ ]  productive cough   [ ]  asthma   [ ]  wheezing NEUROLOGIC:   [ ]  weakness  [ ]  paresthesias  [ ]  aphasia  [ ]  amaurosis  [ ]  dizziness HEMATOLOGIC:   [ ]  bleeding problems   [ ]  clotting disorders MUSCULOSKELETAL:  [ ]  joint pain   [ ]  joint swelling [ ]  leg swelling GASTROINTESTINAL: [ ]   blood in stool  [ ]   hematemesis GENITOURINARY:  [ ]   dysuria  [ ]   hematuria PSYCHIATRIC:  [ ]  history of major depression INTEGUMENTARY:  [ ]  rashes  [ ]  ulcers CONSTITUTIONAL:  [ ]  fever   [ ]  chills  PHYSICAL EXAM: There were no vitals filed for this visit. There is no height or weight on file to calculate BMI. GENERAL: The patient is a well-nourished male, in no acute distress. The vital signs are documented above. CARDIOVASCULAR: There is a regular rate and rhythm without significant murmur appreciated.  PULMONARY: There is good air exchange bilaterally without wheezing or rales. ABDOMEN: Soft and non-tender. The inferior aspect of the incision below the umbilicus has an area of approximately 3 cm where the wound has separated. There is some serous drainage present. There is no exposed intestine. MUSCULOSKELETAL: There are no major deformities or cyanosis. NEUROLOGIC: No focal weakness or paresthesias are  detected. SKIN: There are no ulcers or rashes noted. PSYCHIATRIC: The patient has a normal affect.  MEDICAL ISSUES: Given the history that the patient's girlfriend described, that is profuse "bleeding" from the wound, I feel this best to keep him overnight for observation. If he does not have any significant serous drainage from the wound to suggest a fascial defect in the wound can probably be closed at the bedside tomorrow. If there is significant drainage that he could potentially require exploration in the operating room for a fascial dehiscence. Therefore I will admit him tonight for observation.  DICKSON,CHRISTOPHER S [facility] [place] Office: [phone]

## 2012-01-22 NOTE — Progress Notes (Addendum)
Subjective:     Patient ID: [name] [name], male   DOB: Dec 13, 1965, 46 y.o.   MRN: [id number]  HPI: 46 year old outpatient status post aortobifemoral bypass graft with Dr. Darrick Penna 01/12/2012. The patient called asking to be seen today due to to drainage and the left groin. He did not have any other complaints but wanted to be checked for infection.  Review of Systems: 12 point review of systems is notable for the difficulties described above otherwise unremarkable     Objective:   Physical Exam: Afebrile, vital signs are stable, abdominal and groin lungs are intact Dr. Darrick Penna examined the patient and explained to him and his significant other that this may be lymph fluid and will resolve with time, there is no signs of infection along any of the suture lines. The patient is able to move about without difficulty but does still have pain which he can expect for the next few weeks.     Assessment:     The patient is 10 days status post aortobifemoral bypass and doing well, no signs of infection, just drainage that is to be expected.    Plan:     #1. Refill oxycodone 5 mg 1 by mouth every 6 hours when necessary 40 with no refill  #2. We will remove every other staple in the groins today and all the abdominal staples, patient keep clean and dry #3. Followup with Dr. Darrick Penna as scheduled 01/29/2012.     Lauree Chandler ANP  Clinic M.D.: [place]  Overall Incisions clean Small amount serous drainage adjacent to staples left groin No significant erythema  Follow up with me next week  Fabienne Bruns, MD [facility] [place] Office: [phone] Pager: [phone]

## 2012-01-22 NOTE — ED Notes (Addendum)
PT states he had "some abdominal/leg surgery" to increase circulation to his legs on Monday.  Today pt had a follow up visit with Dr Darrick Penna and his staples were removed.  Around 5 pm he was sitting in the car and noticed his shirt was wet and his intestines were pushing through his skin.  He went to [employer] where they placed a dressing on the wound and told him to come to [facility].  Pt denies any pain and is afebrile at this time.  Abd soft and non-tender.  Dressing intact.  Pt told to notify staff with change in status.

## 2012-01-23 ENCOUNTER — Encounter (HOSPITAL_COMMUNITY): Payer: Self-pay

## 2012-01-23 MED ORDER — OXYCODONE HCL 5 MG PO TABS: 5.0000 mg | ORAL_TABLET | Freq: Four times a day (QID) | ORAL | Status: AC | PRN

## 2012-01-23 MED ORDER — LIDOCAINE HCL (PF) 1 % IJ SOLN
5.0000 mL | Freq: Once | INTRAMUSCULAR | Status: AC
  Filled 2012-01-23: qty 5

## 2012-01-23 MED ORDER — OXYCODONE HCL 5 MG PO TABS
5.0000 mg | ORAL_TABLET | Freq: Four times a day (QID) | ORAL | Status: DC | PRN
  Administered 2012-01-23: 10 mg via ORAL
  Filled 2012-01-23: qty 2

## 2012-01-23 MED ORDER — OXYCODONE HCL 5 MG PO TABS: ORAL_TABLET | ORAL | Status: DC

## 2012-01-23 NOTE — Care Management Note (Signed)
    Page 1 of 1   01/23/2012     2:02:19 PM   CARE MANAGEMENT NOTE 01/23/2012  Patient:  [name] [name],[name] [name]   Account Number:  [id number]  Date Initiated:  01/23/2012  Documentation initiated by:  Lorie Cleckley  Subjective/Objective Assessment:   PT ADM WITH WOUND DEHISCENCE ON 01/22/12.  PTA, PT INDEPENDENT, LIVES WITH SUPPORTIVE GIRLFRIEND.     Action/Plan:   PT FOR DC HOME TODAY.  PT DENIES ANY HOME NEEDS.   Anticipated DC Date:  01/23/2012   Anticipated DC Plan:  HOME/SELF CARE         Choice offered to / List presented to:             Status of service:  Completed, signed off Medicare Important Message given?   (If response is "NO", the following Medicare IM given date fields will be blank) Date Medicare IM given:   Date Additional Medicare IM given:    Discharge Disposition:  HOME/SELF CARE  Per UR Regulation:    If discussed at Long Length of Stay Meetings, dates discussed:    Comments:

## 2012-01-23 NOTE — Discharge Summary (Signed)
[facility] Discharge Summary   Patient ID:  [name] [name] MRN: [id number] DOB/AGE: Feb 11, 1966 46 y.o.  Admit date: 01/22/2012 Discharge date: 01/23/2012 Date of Surgery: * No surgery found * Surgeon:   Admission Diagnosis: abd wound dehiscence  Discharge Diagnoses:  abd wound dehiscence  Secondary Diagnoses: Past Medical History  Diagnosis Date  . GERD (gastroesophageal reflux disease)   . Nicotine addiction   . ETOH abuse   . Anxiety   . Hypertension     no pcp     saw peter nisham once  . Stroke 08/11/11    slurred speech  . Peripheral vascular disease       Discharged Condition: good  HPI: [name] [name] is a 46 y.o. male with the infrarenal aortic occlusion. He presented with progressive disabling claudication. He underwent aortobifemoral bypass graft on 01/12/2012. He was seen in the office today and his staples were removed from his abdominal incision and every other staple was removed from his groins. He apparently had a small amount of serous drainage from the left groin.  The patient noticed later today some separation of the inferior aspect of this incision. He went to [facility] emergency department where they told him to get in his car and drive to [facility]. En route to [facility] his girlfriend reported a large amount of bloody drainage from the wound and also felt that there was intestine as they could be visualized. They drove emergently to [place]. I examined the wound in triage and did not see any evidence of evisceration. There was some serous drainage on the dressing and some adipose tissue but no exposed intestine.  Subjective: Distal abdominal wound dehiscence s/p staple removal 01-22-2012 in the office. The wound is clean and the subcuticular fat layer is visible. No active bloody drainage and no erythema or edema around the skin edges is present.       Hospital Course:  [name] [name] is a 46 y.o. male is S/P Aorta Bilateral femoral  by-pass   Post-op wounds clean, dry, intact or healing well s/p incision closure at bedside Pt. Ambulating, voiding and taking PO diet without difficulty. Pt pain controlled with PO pain meds. Labs as below Complications:none with procedure at bedside  Consults:     Significant Diagnostic Studies: CBC Lab Results  Component Value Date   WBC 9.9 01/22/2012   HGB 10.5* 01/22/2012   HCT 31.4* 01/22/2012   MCV 87.0 01/22/2012   PLT 436* 01/22/2012    BMET    Component Value Date/Time   NA 136 01/14/2012 0410   K 3.5 01/14/2012 0410   CL 101 01/14/2012 0410   CO2 26 01/14/2012 0410   GLUCOSE 190* 01/14/2012 0410   BUN 8 01/14/2012 0410   CREATININE 1.26 01/22/2012 2034   CALCIUM 8.2* 01/14/2012 0410   GFRNONAA 67* 01/22/2012 2034   GFRAA 78* 01/22/2012 2034   COAG Lab Results  Component Value Date   INR 1.36 01/12/2012   INR 1.02 01/08/2012   INR 0.99 08/19/2011     Disposition:  Discharge to :Home Discharge Orders    Future Appointments: Provider: Department: Dept Phone: Center:   01/29/2012 9:30 AM [name], MD [facility] [phone] [facility]   03/11/2012 2:00 PM [facility] [phone] [facility]      [name] [name], [name] [name]  Home Medication Instructions [id number]   Printed on:01/23/12 0809  Medication Information  aspirin 325 MG EC tablet Take 325 mg by mouth daily.           CINNAMON PO Take 500 mg by mouth 2 (two) times daily.            lisinopril (PRINIVIL,ZESTRIL) 20 MG tablet Take 20 mg by mouth 2 (two) times daily.           atenolol (TENORMIN) 100 MG tablet Take 100 mg by mouth daily.           hydrochlorothiazide (HYDRODIURIL) 50 MG tablet Take 50 mg by mouth daily.           glipiZIDE (GLUCOTROL) 5 MG tablet Take 5 mg by mouth daily before breakfast.            Verbal and written Discharge instructions given to the patient. Wound care per Discharge AVS   Signed: [name] 01/23/2012, 8:09 AM

## 2012-01-23 NOTE — Progress Notes (Addendum)
[facility] Progress Note  01/23/2012 7:47 AM HPI:  [name] [name] is a 46 y.o. male with the infrarenal aortic occlusion. He presented with progressive disabling claudication. He underwent aortobifemoral bypass graft on 01/12/2012. He was seen in the office today and his staples were removed from his abdominal incision and every other staple was removed from his groins. He apparently had a small amount of serous drainage from the left groin.  The patient noticed later today some separation of the inferior aspect of this incision. He went to [facility] emergency department where they told him to get in his car and drive to [facility]. En route to [facility] his girlfriend reported a large amount of bloody drainage from the wound and also felt that there was intestine as they could be visualized. They drove emergently to [place]. I examined the wound in triage and did not see any evidence of evisceration. There was some serous drainage on the dressing and some adipose tissue but no exposed intestine.    Subjective:  Distal abdominal wound dehiscence s/p staple removal 01-22-2012 in the office.  The wound is clean and the subcuticular fat layer is visible.  No active bloody drainage and no erythema or edema around the skin edges is present.     Filed Vitals:   01/23/12 0409  BP: 118/69  Pulse: 55  Temp: 97.3 F (36.3 C)  Resp: 19   Procedure: The wound was cleaned with betadine swabs at bed side.  1% lidocaine was injected at the skin edges. A vertical interrupted  mattress sutures were used to close the wound. Clean dry sterile dressing was applied to the incision.    Physical Exam:  Distal lower extremity warm and palpable PT/DP pulses bilateral. Groin incision clean and dry. Abdomin BS positive Abdomin soft   CBC    Component Value Date/Time   WBC 9.9 01/22/2012 2034   RBC 3.61* 01/22/2012 2034   HGB 10.5* 01/22/2012 2034   HCT 31.4* 01/22/2012 2034   PLT 436* 01/22/2012 2034   MCV 87.0 01/22/2012 2034   MCH 29.1 01/22/2012 2034   MCHC 33.4 01/22/2012 2034   RDW 13.8 01/22/2012 2034    BMET    Component Value Date/Time   NA 136 01/14/2012 0410   K 3.5 01/14/2012 0410   CL 101 01/14/2012 0410   CO2 26 01/14/2012 0410   GLUCOSE 190* 01/14/2012 0410   BUN 8 01/14/2012 0410   CREATININE 1.26 01/22/2012 2034   CALCIUM 8.2* 01/14/2012 0410   GFRNONAA 67* 01/22/2012 2034   GFRAA 78* 01/22/2012 2034    INR    Component Value Date/Time   INR 1.36 01/12/2012 1600    No intake or output data in the 24 hours ending 01/23/12 0747   Assessment/Plan:  46 y.o. male is s/p suture removal with abdominal incision with primary closure at bed side.   D/C home F/U in the office next week with Dr. Darrick Penna.  Lianne Cure, PA-C [facility] [phone] 01/23/2012 7:47 AM    Agree with above.  Wounds all look good today.  No further drainage left groin  Fabienne Bruns, MD [facility] [place] Office: [phone] Pager: [phone]

## 2012-01-23 NOTE — Discharge Summary (Signed)
Agree with plans for discharge today.  Di Kindle. Edilia Bo, MD, FACS [facility] [phone] 01/23/2012

## 2012-01-28 ENCOUNTER — Encounter: Payer: Self-pay | Admitting: Vascular Surgery

## 2012-01-29 ENCOUNTER — Encounter: Payer: Self-pay | Admitting: Vascular Surgery

## 2012-01-29 ENCOUNTER — Ambulatory Visit (INDEPENDENT_AMBULATORY_CARE_PROVIDER_SITE_OTHER): Payer: Medicaid Other | Admitting: Vascular Surgery

## 2012-01-29 VITALS — BP 141/76 | HR 56 | Temp 98.0°F | Resp 16 | Ht 74.0 in | Wt 213.0 lb

## 2012-01-29 DIAGNOSIS — I739 Peripheral vascular disease, unspecified: Secondary | ICD-10-CM

## 2012-01-29 DIAGNOSIS — T8130XA Disruption of wound, unspecified, initial encounter: Secondary | ICD-10-CM

## 2012-01-29 NOTE — Progress Notes (Signed)
Patient is a 8 her old male who is status post aortobifemoral bypass on 01/12/2012. He had the abdominal staples removed on July 26. He subsequently opened a portion of the incision near the umbilicus. This was sutured closed, by my partner Dr. Edilia Bo. The patient returns for followup today. He is walking some. However, he is also requesting a handicapped sticker for his car as well as disability.  Although he requested a handicapped sticker, he also states that he wants to be able play basketball long-term. I discussed with the patient today the reason we did his operation he is so that he can increase his walking distance long-term and that he did not need any Sticker. As a matter of fact, this would be actually be detrimental to his overall recovery. I did discuss with him that he may require short-term disability but his overall picture is good and he should not require long-term permanent disability.  He continues to have some drainage from the left groin. He has had no fevers. The drainage is clear in character.  Physical exam: Filed Vitals:   01/29/12 0940  BP: 141/76  Pulse: 56  Temp: 98 F (36.7 C)  TempSrc: Oral  Resp: 16  Height: 6\' 2"  (1.88 m)  Weight: 213 lb (96.616 kg)  SpO2: 100%   Abdomen: Soft nontender nondistended incision is intact no erythema no drainage  Extremities: The left groin has one or 2 drops of serous fluid from the most inferior aspect. The right groin is dry. All staples were removed from both groins today and Steri-Strips placed.  Assessment: Continuing to recover from aortobifemoral bypass. I have encouraged the patient to walk at least 30 minutes daily. The left groin drainage most likely represents lymphatic leak. Hopefully this will resolve over the next few weeks. He will followup with me in 2 weeks' time to recheck both incisions. At that time we will consider removal of the abdominal sutures as well as continued to check on the left groin. Again I do not  believe he needs long-term disability. He may require short-term disability. He certainly does not require a handicapped sticker.  Fabienne Bruns, MD [facility] [place] Office: [phone] Pager: [phone]

## 2012-02-11 ENCOUNTER — Encounter: Payer: Self-pay | Admitting: Vascular Surgery

## 2012-02-12 ENCOUNTER — Encounter: Payer: Self-pay | Admitting: Vascular Surgery

## 2012-02-12 ENCOUNTER — Ambulatory Visit (INDEPENDENT_AMBULATORY_CARE_PROVIDER_SITE_OTHER): Payer: Medicaid Other | Admitting: Vascular Surgery

## 2012-02-12 VITALS — BP 132/82 | HR 77 | Temp 97.6°F | Ht 74.0 in | Wt 212.4 lb

## 2012-02-12 DIAGNOSIS — I6529 Occlusion and stenosis of unspecified carotid artery: Secondary | ICD-10-CM

## 2012-02-12 DIAGNOSIS — I70219 Atherosclerosis of native arteries of extremities with intermittent claudication, unspecified extremity: Secondary | ICD-10-CM

## 2012-02-12 DIAGNOSIS — Z48812 Encounter for surgical aftercare following surgery on the circulatory system: Secondary | ICD-10-CM

## 2012-02-12 NOTE — Progress Notes (Signed)
Patient is a 46 year old male status post aortobifemoral bypass January 12, 2012. He also underwent left carotid endarterectomy in February of 2013 for symptomatic carotid stenosis. He has a known right internal carotid artery occlusion.  He returns today for further followup. He has had persistent clear drainage from the left groin. He is walking 2 miles per day currently. He has no claudication symptoms. He has had some GI distress recently that was probably viral as his fianc currently has similar symptoms. He he'll still has some pain in the left groin and abdomen with certain motions.  Physical exam: Filed Vitals:   02/12/12 0845  BP: 132/82  Pulse: 77  Temp: 97.6 F (36.4 C)  TempSrc: Oral  Height: 6\' 2"  (1.88 m)  Weight: 212 lb 6.4 oz (96.344 kg)  SpO2: 98%   Abdomen: Incision is well-healed with no evidence of hernia, nylon sutures removed today  Extremities: 2+ to cells pedis pulses bilaterally., Right groin is well-healed with a 2+ femoral pulse left groin has a 1 cm opening at the superior aspect with clear drainage, I was able to express approximately 3 cc of fluid from this today the edges of the sinus tract were gently debrided  Assessment: Continued to improve from aortobifemoral bypass with most likely lymph leak left groin  Plan: We'll begin local wound care for the left groin. Hopefully this will heal up in a few weeks. We will try to get him approved for a VAC for this. He was given a refill for pain medication prescription today. I told him that he would not need any further refills after 2 weeks.  He will also need a carotid duplex exam at his next office visit 2 followup on his previous carotid endarterectomy  He will continue to walk as much as possible.  He will continue to try to refrain from smoking.  He will take 1 aspirin daily.  Fabienne Bruns, MD [facility] [place] Office: [phone] Pager: [phone]

## 2012-02-12 NOTE — Addendum Note (Signed)
Addended by: Sharee Pimple on: 02/12/2012 04:56 PM   Modules accepted: Orders

## 2012-02-17 ENCOUNTER — Telehealth: Payer: Self-pay | Admitting: *Deleted

## 2012-02-17 NOTE — Telephone Encounter (Signed)
Ms. Patrina Levering called re: Mr. Ellis need for "more oxy 5". I called her back and told her per Dr. Darrick Penna note of 02-12-12, his Rx was to last until next appt on 02-26-12. She said that the [facility] nurse came out yesterday and put too much tape around the [facility]. She says he only has pain after application of the VAC but he has been taking meds and only has 1 pill left. I suggested that she call the [facility] nurse to see if she could ascess the wound VAC placement for any revision that might relieve patient's pain. Evidently, Mr Spitler went to his PCP, Mauricio Po, NP, and she refused an pain rx today telling him to call us for pain meds. Ms. Patrina Levering will call [facility] nurse and says Mr. Aja will keep his appt with Dr. Darrick Penna on 02-26-12.

## 2012-02-20 ENCOUNTER — Other Ambulatory Visit: Payer: Self-pay | Admitting: *Deleted

## 2012-02-20 DIAGNOSIS — Z48812 Encounter for surgical aftercare following surgery on the circulatory system: Secondary | ICD-10-CM

## 2012-02-20 DIAGNOSIS — I6529 Occlusion and stenosis of unspecified carotid artery: Secondary | ICD-10-CM

## 2012-02-25 ENCOUNTER — Encounter: Payer: Self-pay | Admitting: Vascular Surgery

## 2012-02-26 ENCOUNTER — Ambulatory Visit: Payer: Medicaid Other | Admitting: Vascular Surgery

## 2012-02-26 ENCOUNTER — Encounter: Payer: Self-pay | Admitting: Vascular Surgery

## 2012-02-26 ENCOUNTER — Ambulatory Visit (INDEPENDENT_AMBULATORY_CARE_PROVIDER_SITE_OTHER): Payer: Medicaid Other | Admitting: Vascular Surgery

## 2012-02-26 ENCOUNTER — Other Ambulatory Visit (INDEPENDENT_AMBULATORY_CARE_PROVIDER_SITE_OTHER): Payer: Medicaid Other

## 2012-02-26 VITALS — BP 143/89 | HR 69 | Temp 98.1°F | Ht 74.0 in | Wt 219.0 lb

## 2012-02-26 DIAGNOSIS — I6529 Occlusion and stenosis of unspecified carotid artery: Secondary | ICD-10-CM

## 2012-02-26 DIAGNOSIS — Z48812 Encounter for surgical aftercare following surgery on the circulatory system: Secondary | ICD-10-CM

## 2012-02-26 NOTE — Progress Notes (Signed)
This is a 46 year old who returns for followup today. Adenoid aortobifemoral bypass 01/12/2012. He also underwent left carotid endarterectomy for [facility] 2013. This was done for symptomatic carotid stenosis with stroke. He has some carotid occlusive disease on the right side by history. He denies any symptoms of TIA amaurosis or stroke. He is continued to increase his walking distance. He is still having persistent drainage from left groin. This is controlled by a VAC dressing. He also has recently noted a slight bulge in his abdominal incision.  Physical exam:  Filed Vitals:   02/26/12 1008 02/26/12 1014  BP: 152/88 143/89  Pulse: 63 69  Temp: 98.1 F (36.7 C)   TempSrc: Oral   Height: 6\' 2"  (1.88 m)   Weight: 219 lb (99.338 kg)   SpO2: 100%    Abdomen: Healed abdominal incision with a slight fascial defect a few centimeters below the umbilicus there is no evidence of incarceration  Left groin: 1 cm opening approximately 2 cm in depth with serous drainage, sidewalls granulating  2+ femoral pulses bilaterally, 2+ dorsalis pedis pulses bilaterally  Data: Patient had a carotid duplex exam which I reviewed and interpreted. This shows no significant right internal carotid artery stenosis. The left internal carotid artery is 40-60% with some mild a double hyperplasia however the grayscale images showed no real significant stenosis  Assessment: Slowly healing left groin wound otherwise patent aortobifemoral bypass.  Plan: Continue VAC left groin and return for followup in 2 weeks. He was given a prescription today for oxycodone 60 dispensed.  This should last him until his next office visit. He should not need pain medication much longer. He may have a small hernia defect his abdominal incision. He currently is not interested in having this repaired. He has no evidence of incarceration and I believe this can be safely followed.  Fabienne Bruns, MD [facility]  [place] Office: [phone] Pager: [phone]

## 2012-03-10 ENCOUNTER — Encounter: Payer: Self-pay | Admitting: Vascular Surgery

## 2012-03-11 ENCOUNTER — Telehealth: Payer: Self-pay

## 2012-03-11 ENCOUNTER — Other Ambulatory Visit: Payer: Self-pay

## 2012-03-11 ENCOUNTER — Encounter: Payer: Self-pay | Admitting: Vascular Surgery

## 2012-03-11 ENCOUNTER — Ambulatory Visit (INDEPENDENT_AMBULATORY_CARE_PROVIDER_SITE_OTHER): Payer: Medicaid Other | Admitting: Vascular Surgery

## 2012-03-11 VITALS — BP 140/77 | HR 62 | Temp 98.1°F | Resp 16 | Ht 74.0 in | Wt 219.0 lb

## 2012-03-11 DIAGNOSIS — I70219 Atherosclerosis of native arteries of extremities with intermittent claudication, unspecified extremity: Secondary | ICD-10-CM
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
## 2012-03-11 NOTE — Progress Notes (Signed)
Patient is a 46 year old male status post aorta bifemoral bypass 01/12/2012. He has had difficulty healing of the left groin incision. He currently is receiving VAC therapy to this. This of note he also underwent left carotid endarterectomy in February 2013 percent carotid stenosis. He has has a moderate right carotid stenosis. He returns for followup today. He states that currently the [facility] is accumulating about 300 mL of clearish of bloody fluid daily. He occasionally has some pain in left groin usually with a VAC change. He denies any symptoms of TIA amaurosis or stroke. He is ambulating. He is not smoking.  Physical exam: Filed Vitals:   03/11/12 0908  BP: 140/77  Pulse: 62  Temp: 98.1 F (36.7 C)  TempSrc: Oral  Resp: 16  Height: 6\' 2"  (1.88 m)  Weight: 219 lb (99.338 kg)  SpO2: 100%   Left groin: 1 cm opening with a small amount of clearish fluid expressible, the sinus tract is approximately 3-4 cm deep. There is no surrounding skin erythema. Overall this appears clean.  Abdomen: 3 cm incisional hernia defect approximately 5 cm below the umbilicus, easily reducible  Assessment: Slowly healing left groin wound continue VAC therapy without packing as deep as previously. We will contact his home health agency and informed her not to pack this is deeply so hopefully the tract will go ahead and heal  Plan: Followup 3 weeks The patient was given a prescription for oxycodone 5 mg tablets #30 dispensed today. I have informed him that we need to start weaning him off of his pain medicine at this point. Unfortunately, we were informed by his pharmacy later in the day that he had changed the dispense number 80 tablets.  A new prescription was written for 30 tablets today. The patient was informed that the pharmacy had called [place] regarding the chain she had made in a prescription. We also informed the patient that he would not receive further prescriptions.  Fabienne Bruns, MD [facility] [place] Office: [phone] Pager: [phone]

## 2012-03-11 NOTE — Telephone Encounter (Signed)
[facility] called to question an RX that was written today per Dr. Darrick Penna.  Stated she rec'd RX for Oxycodone with the qty. crossed out, and changed to # 80.  Discussed w/ Dr. Darrick Penna.  Stated he wrote RX for # 30 of Oxycodone.  Informed the [facility] pharmacist.  Stated she will not fill the prescription, since it had been altered, and will tell the pt. the same, since he is waiting at their store.  Dr. Darrick Penna made aware.

## 2012-03-22 ENCOUNTER — Telehealth: Payer: Self-pay

## 2012-03-22 NOTE — Telephone Encounter (Signed)
Rec'd phone call from [facility] RN re: improvement in pt's wound left groin.  States wound measures less than 0.5 cm. in depth, and wound vac has been d/c'd.  States pt has been instructed on NS wet-to-dry dressings.  Has f/u appt. 04/01/12 with Dr. Darrick Penna.

## 2012-03-31 ENCOUNTER — Encounter: Payer: Self-pay | Admitting: Vascular Surgery

## 2012-04-01 ENCOUNTER — Ambulatory Visit (INDEPENDENT_AMBULATORY_CARE_PROVIDER_SITE_OTHER): Payer: Medicaid Other | Admitting: Vascular Surgery

## 2012-04-01 ENCOUNTER — Encounter: Payer: Self-pay | Admitting: Vascular Surgery

## 2012-04-01 VITALS — BP 158/101 | HR 50 | Ht 74.0 in | Wt 226.0 lb

## 2012-04-01 DIAGNOSIS — I6529 Occlusion and stenosis of unspecified carotid artery: Secondary | ICD-10-CM

## 2012-04-01 DIAGNOSIS — I70219 Atherosclerosis of native arteries of extremities with intermittent claudication, unspecified extremity: Secondary | ICD-10-CM

## 2012-04-01 DIAGNOSIS — Z48812 Encounter for surgical aftercare following surgery on the circulatory system: Secondary | ICD-10-CM
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
## 2012-04-02 NOTE — Addendum Note (Signed)
Addended by: Sharee Pimple on: 04/02/2012 09:21 AM   Modules accepted: Orders

## 2012-04-08 DIAGNOSIS — I70219 Atherosclerosis of native arteries of extremities with intermittent claudication, unspecified extremity: Secondary | ICD-10-CM

## 2012-08-19 ENCOUNTER — Ambulatory Visit: Payer: Medicaid Other | Admitting: Vascular Surgery

## 2012-08-19 ENCOUNTER — Other Ambulatory Visit: Payer: Medicaid Other

## 2012-09-29 ENCOUNTER — Encounter: Payer: Self-pay | Admitting: Vascular Surgery

## 2012-09-30 ENCOUNTER — Ambulatory Visit: Payer: Medicaid Other | Admitting: Vascular Surgery

## 2012-09-30 ENCOUNTER — Other Ambulatory Visit: Payer: Medicaid Other

## 2012-10-13 ENCOUNTER — Encounter: Payer: Self-pay | Admitting: Vascular Surgery

## 2012-10-14 ENCOUNTER — Ambulatory Visit: Payer: Medicaid Other | Admitting: Vascular Surgery

## 2012-10-14 ENCOUNTER — Other Ambulatory Visit: Payer: Medicaid Other

## 2012-11-25 ENCOUNTER — Ambulatory Visit: Payer: Medicaid Other | Admitting: Vascular Surgery

## 2012-11-25 ENCOUNTER — Other Ambulatory Visit: Payer: Medicaid Other

## 2012-12-01 ENCOUNTER — Other Ambulatory Visit: Payer: Medicaid Other

## 2013-03-02 ENCOUNTER — Encounter: Payer: Self-pay | Admitting: Vascular Surgery

## 2013-03-03 ENCOUNTER — Other Ambulatory Visit: Payer: Medicaid Other

## 2013-03-03 ENCOUNTER — Ambulatory Visit: Payer: Medicaid Other | Admitting: Vascular Surgery

## 2013-03-23 ENCOUNTER — Encounter: Payer: Self-pay | Admitting: Vascular Surgery

## 2013-03-24 ENCOUNTER — Ambulatory Visit (INDEPENDENT_AMBULATORY_CARE_PROVIDER_SITE_OTHER): Payer: Medicaid Other | Admitting: Vascular Surgery

## 2013-03-24 ENCOUNTER — Ambulatory Visit (INDEPENDENT_AMBULATORY_CARE_PROVIDER_SITE_OTHER)
Admission: RE | Admit: 2013-03-24 | Discharge: 2013-03-24 | Disposition: A | Discharge status: Home / Self Care | Payer: Medicaid Other | Source: Ambulatory Visit | Attending: Vascular Surgery | Admitting: Vascular Surgery

## 2013-03-24 ENCOUNTER — Encounter: Payer: Self-pay | Admitting: Vascular Surgery

## 2013-03-24 ENCOUNTER — Ambulatory Visit (HOSPITAL_COMMUNITY)
Admission: RE | Admit: 2013-03-24 | Discharge: 2013-03-24 | Disposition: A | Discharge status: Home / Self Care | Payer: Medicaid Other | Source: Ambulatory Visit | Attending: Vascular Surgery | Admitting: Vascular Surgery

## 2013-03-24 VITALS — BP 160/99 | HR 80 | Ht 74.0 in | Wt 241.0 lb

## 2013-03-24 DIAGNOSIS — I6529 Occlusion and stenosis of unspecified carotid artery: Secondary | ICD-10-CM

## 2013-03-24 DIAGNOSIS — I739 Peripheral vascular disease, unspecified: Secondary | ICD-10-CM

## 2013-03-24 DIAGNOSIS — Z48812 Encounter for surgical aftercare following surgery on the circulatory system: Secondary | ICD-10-CM

## 2013-03-24 DIAGNOSIS — I70219 Atherosclerosis of native arteries of extremities with intermittent claudication, unspecified extremity: Secondary | ICD-10-CM
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
## 2013-03-30 ENCOUNTER — Ambulatory Visit (INDEPENDENT_AMBULATORY_CARE_PROVIDER_SITE_OTHER): Payer: Medicaid Other | Admitting: General Surgery

## 2013-03-30 DIAGNOSIS — I739 Peripheral vascular disease, unspecified: Secondary | ICD-10-CM | POA: Insufficient documentation

## 2013-03-30 NOTE — Addendum Note (Signed)
Addended by: Sharee Pimple on: 03/30/2013 12:22 PM   Modules accepted: Orders

## 2013-04-06 ENCOUNTER — Telehealth (INDEPENDENT_AMBULATORY_CARE_PROVIDER_SITE_OTHER): Payer: Self-pay | Admitting: General Surgery

## 2013-04-06 ENCOUNTER — Encounter (INDEPENDENT_AMBULATORY_CARE_PROVIDER_SITE_OTHER): Payer: Self-pay | Admitting: General Surgery

## 2013-04-06 ENCOUNTER — Ambulatory Visit (INDEPENDENT_AMBULATORY_CARE_PROVIDER_SITE_OTHER): Payer: Medicaid Other | Admitting: General Surgery

## 2013-04-06 VITALS — BP 136/84 | HR 68 | Temp 96.8°F | Resp 16 | Ht 74.0 in | Wt 236.8 lb

## 2013-04-06 DIAGNOSIS — K432 Incisional hernia without obstruction or gangrene: Secondary | ICD-10-CM | POA: Insufficient documentation

## 2013-04-06 NOTE — Telephone Encounter (Signed)
Faxed [facility] office note from today's visit and confirm was received
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
## 2013-04-06 NOTE — Patient Instructions (Addendum)
You have to stop smoking. Talk with your PCP about stopping smoking  Call if you start to develop abdominal pain around hernia, worsening nausea/vomiting Work on losing weight Will consider hernia surgery when you stop smoking &/or your hernia starts to bother you.  Please call when you have successfully stopped smoking and/or you to start to develop hernia symptoms like we discussed  Ventral Hernia A ventral hernia (also called an incisional hernia) is a hernia that occurs at the site of a previous surgical cut (incision) in the abdomen. The abdominal wall spans from your lower chest down to your pelvis. If the abdominal wall is weakened from a surgical incision, a hernia can occur. A hernia is a bulge of bowel or muscle tissue pushing out on the weakened part of the abdominal wall. Ventral hernias can get bigger from straining or lifting. Obese and older people are at higher risk for a ventral hernia. People who develop infections after surgery or require repeat incisions at the same site on the abdomen are also at increased risk. CAUSES  A ventral hernia occurs because of weakness in the abdominal wall at an incision site.  SYMPTOMS  Common symptoms include:  A visible bulge or lump on the abdominal wall.  Pain or tenderness around the lump.  Increased discomfort if you cough or make a sudden movement. If the hernia has blocked part of the intestine, a serious complication can occur (incarcerated or strangulated hernia). This can become a problem that requires emergency surgery because the blood flow to the blocked intestine may be cut off. Symptoms may include:  Feeling sick to your stomach (nauseous).  Throwing up (vomiting).  Stomach swelling (distention) or bloating.  Fever.  Rapid heartbeat. DIAGNOSIS  Your caregiver will take a medical history and perform a physical exam. Various tests may be ordered, such as:  Blood tests.  Urine  tests.  Ultrasonography.  X-rays.  Computed tomography (CT). TREATMENT  Watchful waiting may be all that is needed for a smaller hernia that does not cause symptoms. Your caregiver may recommend the use of a supportive belt (truss) that helps to keep the abdominal wall intact. For larger hernias or those that cause pain, surgery to repair the hernia is usually recommended. If a hernia becomes strangulated, emergency surgery needs to be done right away. HOME CARE INSTRUCTIONS  Avoid putting pressure or strain on the abdominal area.  Avoid heavy lifting.  Use good body positioning for physical tasks. Ask your caregiver about proper body positioning.  Use a supportive belt as directed by your caregiver.  Maintain a healthy weight.  Eat foods that are high in fiber, such as whole grains, fruits, and vegetables. Fiber helps prevent difficult bowel movements (constipation).  Drink enough fluids to keep your urine clear or pale yellow.  Follow up with your caregiver as directed. SEEK MEDICAL CARE IF:   Your hernia seems to be getting larger or more painful. SEEK IMMEDIATE MEDICAL CARE IF:   You have abdominal pain that is sudden and sharp.  Your pain becomes severe.  You have repeated vomiting.  You are sweating a lot.  You notice a rapid heartbeat.  You develop a fever. MAKE SURE YOU:   Understand these instructions.  Will watch your condition.  Will get help right away if you are not doing well or get worse. Document Released: 06/02/2012 Document Reviewed: 05/21/2012 [facility] Patient Information 2014 [place], [place].

## 2013-05-05 ENCOUNTER — Other Ambulatory Visit: Payer: Self-pay

## 2013-08-05 ENCOUNTER — Other Ambulatory Visit: Payer: Self-pay | Admitting: Vascular Surgery

## 2013-08-05 DIAGNOSIS — Z48812 Encounter for surgical aftercare following surgery on the circulatory system: Secondary | ICD-10-CM

## 2013-08-05 DIAGNOSIS — I6529 Occlusion and stenosis of unspecified carotid artery: Secondary | ICD-10-CM

## 2013-08-05 DIAGNOSIS — I739 Peripheral vascular disease, unspecified: Secondary | ICD-10-CM

## 2013-08-28 DIAGNOSIS — Z9289 Personal history of other medical treatment: Secondary | ICD-10-CM

## 2013-08-28 HISTORY — DX: Personal history of other medical treatment: Z92.89

## 2013-08-29 ENCOUNTER — Telehealth: Payer: Self-pay

## 2013-08-29 NOTE — Telephone Encounter (Signed)
Phone call from pt. Reported he "woke-up in a lot of pain in left leg on Saturday."  Reported that the lower leg from his (L) knee and down to the foot, feels cold.  Stated his pain level was 10/10.  Today, reports of pain continuously; stated that it is not as painful as on Saturday, but still hurts.  Rates at 8/10.  Reports that his toes and bottom of left foot feel numb.  Stated "I can't feel my foot when I walk.Marland Kitchen.Marland Kitchen.I can hardly walk."   Pt. stated he has no transportation until after 4:00 PM today, when his girlfriend gets off work.  Discussed symptoms with Dr. Myra GianottiBrabham.  Advised with no transportation until end of office day, send pt. to the ER.  Discussed with patient, and [facility], when his girlfriend can transport him.  Verb. Understanding.

## 2013-08-30 ENCOUNTER — Encounter (HOSPITAL_COMMUNITY): Payer: Self-pay | Admitting: Emergency Medicine

## 2013-08-30 ENCOUNTER — Emergency Department (HOSPITAL_COMMUNITY): Payer: Medicaid Other

## 2013-08-30 ENCOUNTER — Inpatient Hospital Stay (HOSPITAL_COMMUNITY): Payer: Medicaid Other

## 2013-08-30 ENCOUNTER — Inpatient Hospital Stay (HOSPITAL_COMMUNITY)
Admission: EM | Admit: 2013-08-30 | Discharge: 2013-09-09 | DRG: 238 | Disposition: A | Discharge status: Rehabilitation Facility | Payer: Medicaid Other | Attending: Vascular Surgery | Admitting: Vascular Surgery

## 2013-08-30 DIAGNOSIS — E785 Hyperlipidemia, unspecified: Secondary | ICD-10-CM | POA: Diagnosis present

## 2013-08-30 DIAGNOSIS — F411 Generalized anxiety disorder: Secondary | ICD-10-CM | POA: Diagnosis present

## 2013-08-30 DIAGNOSIS — K219 Gastro-esophageal reflux disease without esophagitis: Secondary | ICD-10-CM | POA: Diagnosis present

## 2013-08-30 DIAGNOSIS — F1010 Alcohol abuse, uncomplicated: Secondary | ICD-10-CM | POA: Diagnosis present

## 2013-08-30 DIAGNOSIS — R209 Unspecified disturbances of skin sensation: Secondary | ICD-10-CM

## 2013-08-30 DIAGNOSIS — F17200 Nicotine dependence, unspecified, uncomplicated: Secondary | ICD-10-CM | POA: Diagnosis present

## 2013-08-30 DIAGNOSIS — D62 Acute posthemorrhagic anemia: Secondary | ICD-10-CM | POA: Diagnosis not present

## 2013-08-30 DIAGNOSIS — Z794 Long term (current) use of insulin: Secondary | ICD-10-CM

## 2013-08-30 DIAGNOSIS — I998 Other disorder of circulatory system: Principal | ICD-10-CM | POA: Diagnosis present

## 2013-08-30 DIAGNOSIS — Z7982 Long term (current) use of aspirin: Secondary | ICD-10-CM

## 2013-08-30 DIAGNOSIS — Z79899 Other long term (current) drug therapy: Secondary | ICD-10-CM

## 2013-08-30 DIAGNOSIS — E7800 Pure hypercholesterolemia, unspecified: Secondary | ICD-10-CM | POA: Diagnosis present

## 2013-08-30 DIAGNOSIS — E119 Type 2 diabetes mellitus without complications: Secondary | ICD-10-CM | POA: Diagnosis present

## 2013-08-30 DIAGNOSIS — M79609 Pain in unspecified limb: Secondary | ICD-10-CM

## 2013-08-30 DIAGNOSIS — G547 Phantom limb syndrome without pain: Secondary | ICD-10-CM | POA: Diagnosis not present

## 2013-08-30 DIAGNOSIS — E876 Hypokalemia: Secondary | ICD-10-CM | POA: Diagnosis not present

## 2013-08-30 DIAGNOSIS — Z8249 Family history of ischemic heart disease and other diseases of the circulatory system: Secondary | ICD-10-CM

## 2013-08-30 DIAGNOSIS — Z23 Encounter for immunization: Secondary | ICD-10-CM

## 2013-08-30 DIAGNOSIS — I70209 Unspecified atherosclerosis of native arteries of extremities, unspecified extremity: Secondary | ICD-10-CM | POA: Diagnosis present

## 2013-08-30 DIAGNOSIS — Z8673 Personal history of transient ischemic attack (TIA), and cerebral infarction without residual deficits: Secondary | ICD-10-CM

## 2013-08-30 DIAGNOSIS — I10 Essential (primary) hypertension: Secondary | ICD-10-CM | POA: Diagnosis present

## 2013-08-30 LAB — BASIC METABOLIC PANEL
BUN: 14 mg/dL (ref 6–23)
CALCIUM: 10.1 mg/dL (ref 8.4–10.5)
CO2: 23 mEq/L (ref 19–32)
Chloride: 99 mEq/L (ref 96–112)
Creatinine, Ser: 0.76 mg/dL (ref 0.50–1.35)
GLUCOSE: 170 mg/dL — AB (ref 70–99)
POTASSIUM: 4.3 meq/L (ref 3.7–5.3)
SODIUM: 139 meq/L (ref 137–147)

## 2013-08-30 LAB — CBC WITH DIFFERENTIAL/PLATELET
BASOS PCT: 1 % (ref 0–1)
Basophils Absolute: 0.1 10*3/uL (ref 0.0–0.1)
EOS PCT: 14 % — AB (ref 0–5)
Eosinophils Absolute: 1.4 10*3/uL — ABNORMAL HIGH (ref 0.0–0.7)
HCT: 38.1 % — ABNORMAL LOW (ref 39.0–52.0)
Hemoglobin: 13.2 g/dL (ref 13.0–17.0)
LYMPHS ABS: 2.6 10*3/uL (ref 0.7–4.0)
Lymphocytes Relative: 26 % (ref 12–46)
MCH: 29.7 pg (ref 26.0–34.0)
MCHC: 34.6 g/dL (ref 30.0–36.0)
MCV: 85.6 fL (ref 78.0–100.0)
Monocytes Absolute: 0.5 10*3/uL (ref 0.1–1.0)
Monocytes Relative: 5 % (ref 3–12)
NEUTROS PCT: 56 % (ref 43–77)
Neutro Abs: 5.8 10*3/uL (ref 1.7–7.7)
Platelets: 159 10*3/uL (ref 150–400)
RBC: 4.45 MIL/uL (ref 4.22–5.81)
RDW: 13.7 % (ref 11.5–15.5)
WBC: 10.4 10*3/uL (ref 4.0–10.5)

## 2013-08-30 LAB — GLUCOSE, CAPILLARY: Glucose-Capillary: 199 mg/dL — ABNORMAL HIGH (ref 70–99)

## 2013-08-30 LAB — PROTIME-INR
INR: 0.99 (ref 0.00–1.49)
Prothrombin Time: 12.9 seconds (ref 11.6–15.2)

## 2013-08-30 MED ORDER — MORPHINE SULFATE 4 MG/ML IJ SOLN
4.0000 mg | Freq: Once | INTRAMUSCULAR | Status: AC
  Administered 2013-08-30: 4 mg via INTRAVENOUS
  Filled 2013-08-30: qty 1

## 2013-08-30 MED ORDER — ATENOLOL 100 MG PO TABS
100.0000 mg | ORAL_TABLET | Freq: Every day | ORAL | Status: DC
  Administered 2013-08-30 – 2013-09-05 (×6): 100 mg via ORAL
  Filled 2013-08-30 (×4): qty 1
  Filled 2013-08-30: qty 4
  Filled 2013-08-30 (×2): qty 1

## 2013-08-30 MED ORDER — ZOLPIDEM TARTRATE 5 MG PO TABS
5.0000 mg | ORAL_TABLET | Freq: Every evening | ORAL | Status: DC | PRN
  Administered 2013-08-31: 5 mg via ORAL
  Filled 2013-08-30: qty 1

## 2013-08-30 MED ORDER — POTASSIUM CHLORIDE CRYS ER 20 MEQ PO TBCR: 20.0000 meq | EXTENDED_RELEASE_TABLET | Freq: Once | ORAL | Status: DC

## 2013-08-30 MED ORDER — ACETAMINOPHEN 325 MG PO TABS: 325.0000 mg | ORAL_TABLET | ORAL | Status: DC | PRN

## 2013-08-30 MED ORDER — HYDRALAZINE HCL 20 MG/ML IJ SOLN: 10.0000 mg | INTRAMUSCULAR | Status: DC | PRN

## 2013-08-30 MED ORDER — DOCUSATE SODIUM 100 MG PO CAPS
100.0000 mg | ORAL_CAPSULE | Freq: Two times a day (BID) | ORAL | Status: DC
  Filled 2013-08-30 (×3): qty 1

## 2013-08-30 MED ORDER — PANTOPRAZOLE SODIUM 40 MG PO TBEC
40.0000 mg | DELAYED_RELEASE_TABLET | Freq: Every day | ORAL | Status: DC
  Administered 2013-08-31: 40 mg via ORAL
  Filled 2013-08-30: qty 1

## 2013-08-30 MED ORDER — ZOLPIDEM TARTRATE 5 MG PO TABS: 10.0000 mg | ORAL_TABLET | Freq: Every evening | ORAL | Status: DC | PRN

## 2013-08-30 MED ORDER — LISINOPRIL 20 MG PO TABS
20.0000 mg | ORAL_TABLET | Freq: Two times a day (BID) | ORAL | Status: DC
  Administered 2013-08-30 – 2013-09-09 (×19): 20 mg via ORAL
  Filled 2013-08-30 (×24): qty 1

## 2013-08-30 MED ORDER — METOPROLOL TARTRATE 1 MG/ML IV SOLN: 2.0000 mg | INTRAVENOUS | Status: DC | PRN

## 2013-08-30 MED ORDER — SODIUM CHLORIDE 0.9 % IV SOLN
INTRAVENOUS | Status: DC
  Administered 2013-08-31: via INTRAVENOUS

## 2013-08-30 MED ORDER — GUAIFENESIN-DM 100-10 MG/5ML PO SYRP: 15.0000 mL | ORAL_SOLUTION | ORAL | Status: DC | PRN

## 2013-08-30 MED ORDER — POTASSIUM CHLORIDE ER 10 MEQ PO TBCR
10.0000 meq | EXTENDED_RELEASE_TABLET | Freq: Every day | ORAL | Status: DC
  Administered 2013-09-01: 10 meq via ORAL
  Filled 2013-08-30 (×3): qty 1

## 2013-08-30 MED ORDER — OXYCODONE HCL 5 MG PO TABS: 5.0000 mg | ORAL_TABLET | ORAL | Status: DC | PRN

## 2013-08-30 MED ORDER — HYDROCHLOROTHIAZIDE 50 MG PO TABS
50.0000 mg | ORAL_TABLET | Freq: Every day | ORAL | Status: DC
  Administered 2013-08-30 – 2013-09-09 (×10): 50 mg via ORAL
  Filled 2013-08-30 (×5): qty 1
  Filled 2013-08-30: qty 2
  Filled 2013-08-30 (×5): qty 1

## 2013-08-30 MED ORDER — INSULIN GLARGINE 100 UNIT/ML ~~LOC~~ SOLN
40.0000 [IU] | Freq: Every day | SUBCUTANEOUS | Status: DC
  Administered 2013-08-31 – 2013-09-08 (×10): 40 [IU] via SUBCUTANEOUS
  Filled 2013-08-30 (×12): qty 0.4

## 2013-08-30 MED ORDER — MORPHINE SULFATE 2 MG/ML IJ SOLN
2.0000 mg | INTRAMUSCULAR | Status: DC | PRN
  Administered 2013-08-31 (×2): 4 mg via INTRAVENOUS
  Administered 2013-08-31: 2 mg via INTRAVENOUS
  Administered 2013-08-31: 4 mg via INTRAVENOUS
  Filled 2013-08-30 (×3): qty 2

## 2013-08-30 MED ORDER — PHENOL 1.4 % MT LIQD: 1.0000 | OROMUCOSAL | Status: DC | PRN

## 2013-08-30 MED ORDER — AMPHETAMINE-DEXTROAMPHETAMINE 10 MG PO TABS
10.0000 mg | ORAL_TABLET | Freq: Every day | ORAL | Status: DC
  Administered 2013-09-01 – 2013-09-09 (×7): 10 mg via ORAL
  Filled 2013-08-30 (×7): qty 1

## 2013-08-30 MED ORDER — HEPARIN BOLUS VIA INFUSION
3000.0000 [IU] | Freq: Once | INTRAVENOUS | Status: AC
  Administered 2013-08-30: 3000 [IU] via INTRAVENOUS
  Filled 2013-08-30: qty 3000

## 2013-08-30 MED ORDER — ALUM & MAG HYDROXIDE-SIMETH 200-200-20 MG/5ML PO SUSP: 15.0000 mL | ORAL | Status: DC | PRN

## 2013-08-30 MED ORDER — ASPIRIN EC 325 MG PO TBEC
325.0000 mg | DELAYED_RELEASE_TABLET | Freq: Every day | ORAL | Status: DC
  Administered 2013-08-30: 325 mg via ORAL
  Filled 2013-08-30 (×2): qty 1

## 2013-08-30 MED ORDER — ACETAMINOPHEN 650 MG RE SUPP: 325.0000 mg | RECTAL | Status: DC | PRN

## 2013-08-30 MED ORDER — ONDANSETRON HCL 4 MG/2ML IJ SOLN: 4.0000 mg | Freq: Four times a day (QID) | INTRAMUSCULAR | Status: DC | PRN

## 2013-08-30 MED ORDER — ALPRAZOLAM 0.25 MG PO TABS
1.0000 mg | ORAL_TABLET | Freq: Every day | ORAL | Status: DC
  Administered 2013-08-30 – 2013-09-09 (×9): 1 mg via ORAL
  Filled 2013-08-30 (×3): qty 4
  Filled 2013-08-30: qty 2
  Filled 2013-08-30 (×3): qty 4

## 2013-08-30 MED ORDER — GLIPIZIDE 5 MG PO TABS
5.0000 mg | ORAL_TABLET | Freq: Every day | ORAL | Status: DC
  Administered 2013-09-01 – 2013-09-09 (×8): 5 mg via ORAL
  Filled 2013-08-30 (×13): qty 1

## 2013-08-30 MED ORDER — LABETALOL HCL 5 MG/ML IV SOLN
10.0000 mg | INTRAVENOUS | Status: DC | PRN
  Administered 2013-08-31: 10 mg via INTRAVENOUS
  Filled 2013-08-30: qty 4

## 2013-08-30 MED ORDER — INSULIN ASPART 100 UNIT/ML ~~LOC~~ SOLN
0.0000 [IU] | Freq: Three times a day (TID) | SUBCUTANEOUS | Status: DC
  Administered 2013-08-31: 8 [IU] via SUBCUTANEOUS
  Administered 2013-09-01: 5 [IU] via SUBCUTANEOUS

## 2013-08-30 MED ORDER — IOHEXOL 350 MG/ML SOLN
100.0000 mL | Freq: Once | INTRAVENOUS | Status: AC | PRN
  Administered 2013-08-30: 100 mL via INTRAVENOUS

## 2013-08-30 MED ORDER — HEPARIN (PORCINE) IN NACL 100-0.45 UNIT/ML-% IJ SOLN
1800.0000 [IU]/h | INTRAMUSCULAR | Status: DC
  Administered 2013-08-31: 1800 [IU]/h via INTRAVENOUS
  Filled 2013-08-30 (×3): qty 250

## 2013-08-30 NOTE — ED Provider Notes (Signed)
Medical screening examination/treatment/procedure(s) were conducted as a shared visit with non-physician practitioner(s) and myself.  I personally evaluated the patient during the encounter.   EKG Interpretation   Date/Time:  Tuesday August 30 2013 17:02:20 EST Ventricular Rate:  66 PR Interval:  140 QRS Duration: 89 QT Interval:  394 QTC Calculation: 413 R Axis:   14 Text Interpretation:  Sinus rhythm Abnormal R-wave progression, late  transition No significant change since last tracing Confirmed by WARD,   DO, KRISTEN [phone]([id number]) on 08/30/2013 6:19:14 PM      Pt is a 48 y.o. M with history of peripheral vascular disease who is followed by Dr. Darrick PennaFields who presents with cold, nonpainful left foot for the past 3-4 days. Unable to Doppler any DP or PT pulse on either foot. His left foot is cooler than his right and he has almost no sensation to light touch in this foot. No edema. No skin discoloration. No joint effusion. Compartments are soft. Will discuss with vascular surgery for evaluation. Patient will likely need admission.  Layla MawKristen N Ward, DO 08/30/13 2117

## 2013-08-30 NOTE — ED Notes (Signed)
Patient transported to CT 

## 2013-08-30 NOTE — ED Notes (Signed)
Patient transported to X-ray 
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
## 2013-08-30 NOTE — Consult Note (Signed)
Pharmacy Note-Anticoagulation  Pharmacy Consult :  48 y.o. male with history of PVD who presents with cold, nonpainful left foot for the past 3-4 days. Unable to Doppler any DP or PT pulse on either foot. His left foot is cooler than his right and he has almost no sensation to light touch in this foot. No edema. No skin discoloration. No joint effusion. Compartments are soft.  Heparin will be started, with bolus.  Latest Labs : Hematology :  Recent Labs  08/30/13 1805 08/30/13 1824  HGB  --  13.2  HCT  --  38.1*  PLT  --  159  LABPROT  --  12.9  INR  --  0.99  CREATININE 0.76  --     Lab Results  Component Value Date   INR 0.99 08/30/2013        HGB 13.2 08/30/2013    Current Medication[s] Include: Medication PTA: Med History Pending Scheduled:  Scheduled:  . ALPRAZolam  1 mg Oral Daily  . amphetamine-dextroamphetamine  10 mg Oral Daily  . aspirin  325 mg Oral Daily  . atenolol  100 mg Oral Daily  . docusate sodium  100 mg Oral BID  . [START ON 08/31/2013] glipiZIDE  5 mg Oral QAC breakfast  . hydrochlorothiazide  50 mg Oral Daily  . [START ON 08/31/2013] insulin aspart  0-15 Units Subcutaneous TID WC  . insulin glargine  40 Units Subcutaneous QHS  . lisinopril  20 mg Oral BID  . pantoprazole  40 mg Oral Daily  . [START ON 08/31/2013] potassium chloride  10 mEq Oral Daily  . potassium chloride  20-40 mEq Oral Once   Infusion[s]: Infusions:  . HEPARIN  To Be Started   Antibiotic[s]: Anti-infectives   None     Goal :  Heparin goal is Heparin level 0.3-0.7 units/ml.  Plan : 1.  Heparin will be dosed using the [place] Guidelines.    2.  Heparin bolus 3000 units IV now.   Begin Heparin infusion at 1800 units/hr 3.  Next Heparin level in 6 hours. 4.  Daily heparin level and CBC.  Monitor for bleeding complications.  Isreal Moline, Elisha HeadlandEarle J, Pharm.D. 08/30/2013  10:54 PM
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
## 2013-08-30 NOTE — ED Notes (Signed)
Dopplered intermittent DPP on left leg.  Extremity cool to touch and numbness to leg

## 2013-08-31 ENCOUNTER — Encounter (HOSPITAL_COMMUNITY)
Admission: EM | Disposition: A | Discharge status: Rehabilitation Facility | Payer: Self-pay | Source: Home / Self Care | Attending: Vascular Surgery

## 2013-08-31 ENCOUNTER — Inpatient Hospital Stay (HOSPITAL_COMMUNITY): Payer: Medicaid Other | Admitting: Anesthesiology

## 2013-08-31 ENCOUNTER — Inpatient Hospital Stay (HOSPITAL_COMMUNITY): Payer: Medicaid Other

## 2013-08-31 ENCOUNTER — Encounter (HOSPITAL_COMMUNITY): Payer: Medicaid Other | Admitting: Anesthesiology

## 2013-08-31 DIAGNOSIS — I70229 Atherosclerosis of native arteries of extremities with rest pain, unspecified extremity: Secondary | ICD-10-CM

## 2013-08-31 HISTORY — PX: AXILLARY-FEMORAL BYPASS GRAFT: SHX894

## 2013-08-31 LAB — URINALYSIS, ROUTINE W REFLEX MICROSCOPIC
BILIRUBIN URINE: NEGATIVE
Glucose, UA: >1000 mg/dL — AB
Hgb urine dipstick: NEGATIVE
KETONES UR: NEGATIVE mg/dL
Leukocytes, UA: NEGATIVE
NITRITE: NEGATIVE
Protein, ur: NEGATIVE mg/dL
Specific Gravity, Urine: 1.03 (ref 1.005–1.030)
UROBILINOGEN UA: 0.2 mg/dL (ref 0.0–1.0)
pH: 5 (ref 5.0–8.0)

## 2013-08-31 LAB — HEPARIN LEVEL (UNFRACTIONATED): Heparin Unfractionated: 0.48 IU/mL (ref 0.30–0.70)

## 2013-08-31 LAB — CBC
HEMATOCRIT: 36 % — AB (ref 39.0–52.0)
Hemoglobin: 12.4 g/dL — ABNORMAL LOW (ref 13.0–17.0)
MCH: 29.3 pg (ref 26.0–34.0)
MCHC: 34.4 g/dL (ref 30.0–36.0)
MCV: 85.1 fL (ref 78.0–100.0)
PLATELETS: 154 10*3/uL (ref 150–400)
RBC: 4.23 MIL/uL (ref 4.22–5.81)
RDW: 13.6 % (ref 11.5–15.5)
WBC: 10 10*3/uL (ref 4.0–10.5)

## 2013-08-31 LAB — BASIC METABOLIC PANEL
BUN: 11 mg/dL (ref 6–23)
CALCIUM: 9.3 mg/dL (ref 8.4–10.5)
CO2: 25 mEq/L (ref 19–32)
Chloride: 98 mEq/L (ref 96–112)
Creatinine, Ser: 0.89 mg/dL (ref 0.50–1.35)
GFR calc Af Amer: >90 mL/min (ref >90)
Glucose, Bld: 239 mg/dL — ABNORMAL HIGH (ref 70–99)
Potassium: 3 mEq/L — ABNORMAL LOW (ref 3.7–5.3)
Sodium: 139 mEq/L (ref 137–147)

## 2013-08-31 LAB — SURGICAL PCR SCREEN
MRSA, PCR: POSITIVE — AB
Staphylococcus aureus: POSITIVE — AB

## 2013-08-31 LAB — HEMOGLOBIN A1C
HEMOGLOBIN A1C: 9.3 % — AB (ref <5.7)
Mean Plasma Glucose: 220 mg/dL — ABNORMAL HIGH (ref <117)

## 2013-08-31 LAB — GLUCOSE, CAPILLARY
Glucose-Capillary: 278 mg/dL — ABNORMAL HIGH (ref 70–99)
Glucose-Capillary: 242 mg/dL — ABNORMAL HIGH (ref 70–99)

## 2013-08-31 LAB — PREPARE RBC (CROSSMATCH)

## 2013-08-31 LAB — URINE MICROSCOPIC-ADD ON

## 2013-08-31 SURGERY — CREATION, BYPASS, ARTERIAL, AXILLARY TO BILATERAL FEMORAL, USING GRAFT
Anesthesia: General | Site: Leg Upper | Laterality: Bilateral

## 2013-08-31 MED ORDER — LACTATED RINGERS IV SOLN
INTRAVENOUS | Status: DC | PRN
  Administered 2013-08-31 (×3): via INTRAVENOUS

## 2013-08-31 MED ORDER — 0.9 % SODIUM CHLORIDE (POUR BTL) OPTIME
TOPICAL | Status: DC | PRN
  Administered 2013-08-31 (×3): 1000 mL

## 2013-08-31 MED ORDER — FENTANYL CITRATE 0.05 MG/ML IJ SOLN
INTRAMUSCULAR | Status: AC
  Filled 2013-08-31: qty 5

## 2013-08-31 MED ORDER — PROPOFOL 10 MG/ML IV BOLUS
INTRAVENOUS | Status: AC
  Filled 2013-08-31: qty 20

## 2013-08-31 MED ORDER — OXYCODONE HCL 5 MG PO TABS: 5.0000 mg | ORAL_TABLET | Freq: Once | ORAL | Status: DC | PRN

## 2013-08-31 MED ORDER — MUPIROCIN 2 % EX OINT
1.0000 | TOPICAL_OINTMENT | Freq: Two times a day (BID) | CUTANEOUS | Status: AC
Start: 2013-08-31 — End: 2013-09-04
  Administered 2013-08-31 – 2013-09-04 (×8): 1 via NASAL
  Filled 2013-08-31 (×3): qty 22

## 2013-08-31 MED ORDER — PHENOL 1.4 % MT LIQD: 1.0000 | OROMUCOSAL | Status: DC | PRN

## 2013-08-31 MED ORDER — POTASSIUM CHLORIDE 10 MEQ/50ML IV SOLN
10.0000 meq | INTRAVENOUS | Status: DC
  Filled 2013-08-31: qty 50

## 2013-08-31 MED ORDER — THROMBIN 20000 UNITS EX SOLR
CUTANEOUS | Status: AC
  Filled 2013-08-31: qty 20000

## 2013-08-31 MED ORDER — OXYCODONE HCL 5 MG PO TABS
5.0000 mg | ORAL_TABLET | ORAL | Status: DC | PRN
  Administered 2013-09-01: 10 mg via ORAL
  Administered 2013-09-01 (×2): 5 mg via ORAL
  Administered 2013-09-01 – 2013-09-09 (×35): 10 mg via ORAL
  Filled 2013-08-31 (×2): qty 2
  Filled 2013-08-31: qty 1
  Filled 2013-08-31 (×3): qty 2
  Filled 2013-08-31: qty 1
  Filled 2013-08-31 (×15): qty 2
  Filled 2013-08-31: qty 1
  Filled 2013-08-31 (×15): qty 2

## 2013-08-31 MED ORDER — MIDAZOLAM HCL 5 MG/5ML IJ SOLN
INTRAMUSCULAR | Status: DC | PRN
  Administered 2013-08-31: 2 mg via INTRAVENOUS

## 2013-08-31 MED ORDER — DOPAMINE-DEXTROSE 3.2-5 MG/ML-% IV SOLN: 3.0000 ug/kg/min | INTRAVENOUS | Status: DC

## 2013-08-31 MED ORDER — PHENYLEPHRINE HCL 10 MG/ML IJ SOLN
10.0000 mg | INTRAVENOUS | Status: DC | PRN
  Administered 2013-08-31: 10 ug/min via INTRAVENOUS

## 2013-08-31 MED ORDER — PROTAMINE SULFATE 10 MG/ML IV SOLN
INTRAVENOUS | Status: AC
  Filled 2013-08-31: qty 10

## 2013-08-31 MED ORDER — CHLORHEXIDINE GLUCONATE CLOTH 2 % EX PADS
6.0000 | MEDICATED_PAD | Freq: Every day | CUTANEOUS | Status: DC
  Administered 2013-08-31: 6 via TOPICAL

## 2013-08-31 MED ORDER — LACTATED RINGERS IV SOLN
INTRAVENOUS | Status: DC | PRN
  Administered 2013-08-31 (×3): via INTRAVENOUS

## 2013-08-31 MED ORDER — DEXTROSE 5 % IV SOLN
1.5000 g | Freq: Two times a day (BID) | INTRAVENOUS | Status: AC
  Administered 2013-09-01 (×2): 1.5 g via INTRAVENOUS
  Filled 2013-08-31 (×3): qty 1.5

## 2013-08-31 MED ORDER — MIDAZOLAM HCL 2 MG/2ML IJ SOLN
INTRAMUSCULAR | Status: AC
  Filled 2013-08-31: qty 2

## 2013-08-31 MED ORDER — SODIUM CHLORIDE 0.9 % IV SOLN
INTRAVENOUS | Status: DC
  Administered 2013-09-01 – 2013-09-05 (×4): via INTRAVENOUS

## 2013-08-31 MED ORDER — ARTIFICIAL TEARS OP OINT
TOPICAL_OINTMENT | OPHTHALMIC | Status: AC
  Filled 2013-08-31: qty 3.5

## 2013-08-31 MED ORDER — NEOSTIGMINE METHYLSULFATE 1 MG/ML IJ SOLN
INTRAMUSCULAR | Status: DC | PRN
  Administered 2013-08-31: 5 mg via INTRAVENOUS

## 2013-08-31 MED ORDER — DEXTROSE 5 % IV SOLN
1.5000 g | INTRAVENOUS | Status: DC | PRN
  Administered 2013-08-31 (×2): 1.5 g via INTRAVENOUS

## 2013-08-31 MED ORDER — ROCURONIUM BROMIDE 100 MG/10ML IV SOLN
INTRAVENOUS | Status: DC | PRN
  Administered 2013-08-31: 10 mg via INTRAVENOUS
  Administered 2013-08-31: 50 mg via INTRAVENOUS
  Administered 2013-08-31: 20 mg via INTRAVENOUS
  Administered 2013-08-31 (×4): 10 mg via INTRAVENOUS

## 2013-08-31 MED ORDER — ROCURONIUM BROMIDE 50 MG/5ML IV SOLN
INTRAVENOUS | Status: AC
  Filled 2013-08-31: qty 1

## 2013-08-31 MED ORDER — WHITE PETROLATUM GEL
Status: DC | PRN
  Filled 2013-08-31 (×3): qty 5

## 2013-08-31 MED ORDER — MEPERIDINE HCL 25 MG/ML IJ SOLN: 6.2500 mg | INTRAMUSCULAR | Status: DC | PRN

## 2013-08-31 MED ORDER — ALBUMIN HUMAN 5 % IV SOLN
INTRAVENOUS | Status: DC | PRN
  Administered 2013-08-31 (×2): via INTRAVENOUS

## 2013-08-31 MED ORDER — ONDANSETRON HCL 4 MG/2ML IJ SOLN: 4.0000 mg | Freq: Once | INTRAMUSCULAR | Status: DC | PRN

## 2013-08-31 MED ORDER — MORPHINE SULFATE 2 MG/ML IJ SOLN
2.0000 mg | INTRAMUSCULAR | Status: DC | PRN
Start: 2013-08-31 — End: 2013-09-01
  Administered 2013-08-31 – 2013-09-01 (×5): 4 mg via INTRAVENOUS
  Filled 2013-08-31 (×5): qty 2

## 2013-08-31 MED ORDER — ALUM & MAG HYDROXIDE-SIMETH 200-200-20 MG/5ML PO SUSP: 15.0000 mL | ORAL | Status: DC | PRN

## 2013-08-31 MED ORDER — HYDRALAZINE HCL 20 MG/ML IJ SOLN
10.0000 mg | INTRAMUSCULAR | Status: DC | PRN
  Administered 2013-08-31: 10 mg via INTRAVENOUS

## 2013-08-31 MED ORDER — CEFUROXIME SODIUM 1.5 G IJ SOLR
1.5000 g | INTRAMUSCULAR | Status: DC
  Filled 2013-08-31: qty 1.5

## 2013-08-31 MED ORDER — SODIUM CHLORIDE 0.9 % IV SOLN
500.0000 mL | Freq: Once | INTRAVENOUS | Status: AC | PRN
Start: 2013-08-31 — End: 2013-08-31

## 2013-08-31 MED ORDER — LABETALOL HCL 5 MG/ML IV SOLN
10.0000 mg | INTRAVENOUS | Status: DC | PRN
  Filled 2013-08-31: qty 4

## 2013-08-31 MED ORDER — GUAIFENESIN-DM 100-10 MG/5ML PO SYRP
15.0000 mL | ORAL_SOLUTION | ORAL | Status: DC | PRN
Start: 2013-08-31 — End: 2013-09-09

## 2013-08-31 MED ORDER — HEPARIN SODIUM (PORCINE) 1000 UNIT/ML IJ SOLN
INTRAMUSCULAR | Status: AC
  Filled 2013-08-31: qty 2

## 2013-08-31 MED ORDER — POTASSIUM CHLORIDE 10 MEQ/100ML IV SOLN
INTRAVENOUS | Status: DC | PRN
  Administered 2013-08-31 (×2): 10 meq via INTRAVENOUS

## 2013-08-31 MED ORDER — DOCUSATE SODIUM 100 MG PO CAPS
100.0000 mg | ORAL_CAPSULE | Freq: Every day | ORAL | Status: DC
  Administered 2013-09-01 – 2013-09-09 (×7): 100 mg via ORAL
  Filled 2013-08-31 (×9): qty 1

## 2013-08-31 MED ORDER — ONDANSETRON HCL 4 MG/2ML IJ SOLN
INTRAMUSCULAR | Status: DC | PRN
  Administered 2013-08-31: 4 mg via INTRAVENOUS

## 2013-08-31 MED ORDER — IOHEXOL 300 MG/ML  SOLN
INTRAMUSCULAR | Status: DC | PRN
  Administered 2013-08-31: 50 mL via INTRAVENOUS

## 2013-08-31 MED ORDER — HEPARIN SODIUM (PORCINE) 1000 UNIT/ML IJ SOLN
INTRAMUSCULAR | Status: AC
  Filled 2013-08-31: qty 1

## 2013-08-31 MED ORDER — ROCURONIUM BROMIDE 50 MG/5ML IV SOLN
INTRAVENOUS | Status: AC
  Filled 2013-08-31: qty 2

## 2013-08-31 MED ORDER — INFLUENZA VAC SPLIT QUAD 0.5 ML IM SUSP
0.5000 mL | INTRAMUSCULAR | Status: AC
  Administered 2013-09-01: 0.5 mL via INTRAMUSCULAR
  Filled 2013-08-31: qty 0.5

## 2013-08-31 MED ORDER — PNEUMOCOCCAL VAC POLYVALENT 25 MCG/0.5ML IJ INJ
0.5000 mL | INJECTION | INTRAMUSCULAR | Status: AC
  Administered 2013-09-01: 0.5 mL via INTRAMUSCULAR
  Filled 2013-08-31: qty 0.5

## 2013-08-31 MED ORDER — HYDROMORPHONE HCL PF 1 MG/ML IJ SOLN
INTRAMUSCULAR | Status: AC
  Administered 2013-08-31: 0.5 mg via INTRAVENOUS
  Filled 2013-08-31: qty 1

## 2013-08-31 MED ORDER — LIDOCAINE HCL (CARDIAC) 20 MG/ML IV SOLN
INTRAVENOUS | Status: DC | PRN
  Administered 2013-08-31: 100 mg via INTRAVENOUS

## 2013-08-31 MED ORDER — HEPARIN SODIUM (PORCINE) 1000 UNIT/ML IJ SOLN
INTRAMUSCULAR | Status: DC | PRN
  Administered 2013-08-31: 10000 [IU] via INTRAVENOUS
  Administered 2013-08-31: 2000 [IU] via INTRAVENOUS
  Administered 2013-08-31: 7000 [IU] via INTRAVENOUS
  Administered 2013-08-31 (×2): 10000 [IU] via INTRAVENOUS
  Administered 2013-08-31: 3000 [IU] via INTRAVENOUS

## 2013-08-31 MED ORDER — DEXTROSE 5 % IV SOLN
INTRAVENOUS | Status: AC
  Filled 2013-08-31: qty 1.5

## 2013-08-31 MED ORDER — PROPOFOL 10 MG/ML IV BOLUS
INTRAVENOUS | Status: DC | PRN
  Administered 2013-08-31: 30 mg via INTRAVENOUS
  Administered 2013-08-31: 200 mg via INTRAVENOUS

## 2013-08-31 MED ORDER — SODIUM CHLORIDE 0.9 % IR SOLN
Status: DC | PRN
  Administered 2013-08-31 (×2)

## 2013-08-31 MED ORDER — ARTIFICIAL TEARS OP OINT
TOPICAL_OINTMENT | OPHTHALMIC | Status: DC | PRN
  Administered 2013-08-31: 1 via OPHTHALMIC

## 2013-08-31 MED ORDER — HYDRALAZINE HCL 20 MG/ML IJ SOLN
INTRAMUSCULAR | Status: AC
  Administered 2013-08-31: 10 mg via INTRAVENOUS
  Filled 2013-08-31: qty 1

## 2013-08-31 MED ORDER — OXYCODONE HCL 5 MG/5ML PO SOLN: 5.0000 mg | Freq: Once | ORAL | Status: DC | PRN

## 2013-08-31 MED ORDER — PANTOPRAZOLE SODIUM 40 MG PO TBEC
40.0000 mg | DELAYED_RELEASE_TABLET | Freq: Every day | ORAL | Status: DC
  Administered 2013-08-31 – 2013-09-09 (×10): 40 mg via ORAL
  Filled 2013-08-31 (×8): qty 1

## 2013-08-31 MED ORDER — ASPIRIN EC 325 MG PO TBEC
325.0000 mg | DELAYED_RELEASE_TABLET | Freq: Every day | ORAL | Status: DC
  Administered 2013-09-01 – 2013-09-09 (×8): 325 mg via ORAL
  Filled 2013-08-31 (×9): qty 1

## 2013-08-31 MED ORDER — ONDANSETRON HCL 4 MG/2ML IJ SOLN
4.0000 mg | Freq: Four times a day (QID) | INTRAMUSCULAR | Status: DC | PRN
  Administered 2013-09-01: 4 mg via INTRAVENOUS
  Filled 2013-08-31: qty 2

## 2013-08-31 MED ORDER — ACETAMINOPHEN 650 MG RE SUPP: 325.0000 mg | RECTAL | Status: DC | PRN

## 2013-08-31 MED ORDER — FENTANYL CITRATE 0.05 MG/ML IJ SOLN
INTRAMUSCULAR | Status: DC | PRN
  Administered 2013-08-31 (×4): 50 ug via INTRAVENOUS
  Administered 2013-08-31 (×2): 100 ug via INTRAVENOUS
  Administered 2013-08-31: 50 ug via INTRAVENOUS
  Administered 2013-08-31: 100 ug via INTRAVENOUS
  Administered 2013-08-31 (×2): 50 ug via INTRAVENOUS

## 2013-08-31 MED ORDER — MAGNESIUM SULFATE 40 MG/ML IJ SOLN
2.0000 g | Freq: Every day | INTRAMUSCULAR | Status: DC | PRN
  Filled 2013-08-31: qty 50

## 2013-08-31 MED ORDER — GLYCOPYRROLATE 0.2 MG/ML IJ SOLN
INTRAMUSCULAR | Status: DC | PRN
  Administered 2013-08-31: 0.6 mg via INTRAVENOUS
  Administered 2013-08-31: 0.2 mg via INTRAVENOUS

## 2013-08-31 MED ORDER — METOPROLOL TARTRATE 1 MG/ML IV SOLN: 2.0000 mg | INTRAVENOUS | Status: DC | PRN

## 2013-08-31 MED ORDER — PROTAMINE SULFATE 10 MG/ML IV SOLN
INTRAVENOUS | Status: DC | PRN
  Administered 2013-08-31: 100 mg via INTRAVENOUS

## 2013-08-31 MED ORDER — HYDROMORPHONE HCL PF 1 MG/ML IJ SOLN
0.2500 mg | INTRAMUSCULAR | Status: DC | PRN
  Administered 2013-08-31 (×4): 0.5 mg via INTRAVENOUS

## 2013-08-31 MED ORDER — ONDANSETRON HCL 4 MG/2ML IJ SOLN
INTRAMUSCULAR | Status: AC
  Filled 2013-08-31: qty 2

## 2013-08-31 MED ORDER — ACETAMINOPHEN 325 MG PO TABS
325.0000 mg | ORAL_TABLET | ORAL | Status: DC | PRN
  Administered 2013-09-02 – 2013-09-06 (×2): 650 mg via ORAL
  Filled 2013-08-31 (×2): qty 2

## 2013-08-31 MED ORDER — MORPHINE SULFATE 2 MG/ML IJ SOLN
INTRAMUSCULAR | Status: AC
  Filled 2013-08-31: qty 1

## 2013-08-31 MED ORDER — POTASSIUM CHLORIDE CRYS ER 20 MEQ PO TBCR
20.0000 meq | EXTENDED_RELEASE_TABLET | Freq: Every day | ORAL | Status: AC | PRN
  Administered 2013-09-01: 40 meq via ORAL
  Filled 2013-08-31: qty 2

## 2013-08-31 SURGICAL SUPPLY — 58 items
BANDAGE ELASTIC 4 VELCRO ST LF (GAUZE/BANDAGES/DRESSINGS) ×2 IMPLANT
BANDAGE GAUZE ELAST BULKY 4 IN (GAUZE/BANDAGES/DRESSINGS) ×2 IMPLANT
BLADE SURG 11 STRL SS (BLADE) ×2 IMPLANT
CANISTER SUCTION 2500CC (MISCELLANEOUS) ×2 IMPLANT
CATH EMB 3FR 80CM (CATHETERS) ×2 IMPLANT
CATH EMB 4FR 80CM (CATHETERS) ×2 IMPLANT
CATH EMB 5FR 80CM (CATHETERS) ×2 IMPLANT
CLIP TI MEDIUM 24 (CLIP) ×2 IMPLANT
CLIP TI WIDE RED SMALL 24 (CLIP) ×2 IMPLANT
COVER SURGICAL LIGHT HANDLE (MISCELLANEOUS) ×2 IMPLANT
DERMABOND ADHESIVE PROPEN (GAUZE/BANDAGES/DRESSINGS) ×1
DERMABOND ADVANCED (GAUZE/BANDAGES/DRESSINGS)
DERMABOND ADVANCED .7 DNX12 (GAUZE/BANDAGES/DRESSINGS) IMPLANT
DERMABOND ADVANCED .7 DNX6 (GAUZE/BANDAGES/DRESSINGS) ×1 IMPLANT
DRAIN SNY 10X20 3/4 PERF (WOUND CARE) IMPLANT
DRAPE INCISE IOBAN 66X45 STRL (DRAPES) ×4 IMPLANT
DRAPE WARM FLUID 44X44 (DRAPE) ×4 IMPLANT
DRAPE X-RAY CASS 24X20 (DRAPES) ×4 IMPLANT
DRSG COVADERM 4X10 (GAUZE/BANDAGES/DRESSINGS) IMPLANT
DRSG COVADERM 4X8 (GAUZE/BANDAGES/DRESSINGS) IMPLANT
ELECT CAUTERY BLADE 6.4 (BLADE) ×2 IMPLANT
ELECT REM PT RETURN 9FT ADLT (ELECTROSURGICAL) ×2
ELECTRODE REM PT RTRN 9FT ADLT (ELECTROSURGICAL) ×1 IMPLANT
EVACUATOR SILICONE 100CC (DRAIN) IMPLANT
GAUZE SPONGE 4X4 16PLY XRAY LF (GAUZE/BANDAGES/DRESSINGS) ×4 IMPLANT
GLOVE BIO SURGEON STRL SZ7.5 (GLOVE) ×2 IMPLANT
GOWN STRL REUS W/ TWL LRG LVL3 (GOWN DISPOSABLE) ×3 IMPLANT
GOWN STRL REUS W/TWL LRG LVL3 (GOWN DISPOSABLE) ×3
GRAFT HEMASHIELD 8MM (Vascular Products) ×1 IMPLANT
GRAFT VASC STRG 30X8KNIT (Vascular Products) ×1 IMPLANT
KIT BASIN OR (CUSTOM PROCEDURE TRAY) ×2 IMPLANT
KIT ROOM TURNOVER OR (KITS) ×2 IMPLANT
LOOP VESSEL MINI RED (MISCELLANEOUS) ×2 IMPLANT
MARKER SKIN DUAL TIP RULER LAB (MISCELLANEOUS) ×2 IMPLANT
NS IRRIG 1000ML POUR BTL (IV SOLUTION) ×6 IMPLANT
PACK PERIPHERAL VASCULAR (CUSTOM PROCEDURE TRAY) ×2 IMPLANT
PAD ARMBOARD 7.5X6 YLW CONV (MISCELLANEOUS) ×4 IMPLANT
PATCH HEMASHIELD 8X75 (Vascular Products) ×2 IMPLANT
PENCIL BUTTON HOLSTER BLD 10FT (ELECTRODE) ×2 IMPLANT
SET COLLECT BLD 21X3/4 12 (NEEDLE) ×2 IMPLANT
SET EXTENSION TUBING 8  CATH (SET/KITS/TRAYS/PACK) ×2 IMPLANT
SPONGE GAUZE 4X4 12PLY (GAUZE/BANDAGES/DRESSINGS) ×2 IMPLANT
SPONGE LAP 18X18 X RAY DECT (DISPOSABLE) ×6 IMPLANT
SPONGE SURGIFOAM ABS GEL 100 (HEMOSTASIS) IMPLANT
STAPLER VISISTAT 35W (STAPLE) IMPLANT
SUT MNCRL AB 4-0 PS2 18 (SUTURE) IMPLANT
SUT PROLENE 5 0 C 1 24 (SUTURE) ×14 IMPLANT
SUT PROLENE 6 0 CC (SUTURE) ×16 IMPLANT
SUT SILK 2 0 FS (SUTURE) IMPLANT
SUT VIC AB 2-0 CTX 36 (SUTURE) ×6 IMPLANT
SUT VIC AB 3-0 SH 27 (SUTURE) ×3
SUT VIC AB 3-0 SH 27X BRD (SUTURE) ×3 IMPLANT
SUT VICRYL 4-0 PS2 18IN ABS (SUTURE) ×6 IMPLANT
SYR 3ML LL SCALE MARK (SYRINGE) ×2 IMPLANT
TOWEL OR 17X24 6PK STRL BLUE (TOWEL DISPOSABLE) ×4 IMPLANT
TOWEL OR 17X26 10 PK STRL BLUE (TOWEL DISPOSABLE) ×4 IMPLANT
TRAY FOLEY CATH 16FRSI W/METER (SET/KITS/TRAYS/PACK) ×2 IMPLANT
WATER STERILE IRR 1000ML POUR (IV SOLUTION) ×2 IMPLANT

## 2013-08-31 NOTE — Progress Notes (Signed)
Report given to David RN.

## 2013-08-31 NOTE — Transfer of Care (Signed)
Immediate Anesthesia Transfer of Care Note  Patient: [name] LauthRobert [name]  Procedure(s) Performed: Procedure(s): Left Iliac Thrombectomy, Left and Right Femoral Endarterectomy, Left to Right Femoral By Pass Graft, Right Iliac Thrombectomy, Left Popliteal and Left Tibial Embolectomy, Patch Angioplasty of Left Common Femoral Artery.  Four Compartment Fasciotomy and Arteriogram (Bilateral)  Patient Location: PACU  Anesthesia Type:General  Level of Consciousness: awake  Airway & Oxygen Therapy: Patient Spontanous Breathing and Patient connected to face mask oxygen  Post-op Assessment: Report given to PACU RN and Post -op Vital signs reviewed and stable  Post vital signs: Reviewed and stable  Complications: No apparent anesthesia complications

## 2013-08-31 NOTE — Preoperative (Signed)
Beta Blockers   Reason not to administer Beta Blockers:Not Applicable 
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
## 2013-08-31 NOTE — Progress Notes (Signed)
Patient resulted positive for MRSA and Staph pre-surgical screening.  Contact precautions initiated and treatment ordered.  Arva ChafeLester, Gaylon Bentz Michelle

## 2013-08-31 NOTE — Op Note (Signed)
Procedure: Left iliac thrombectomy, left femoral endarterectomy, patch angioplasty left femoral artery, right iliac thrombectomy, left to right femoral-femoral bypass, left popliteal and tibial embolectomy, vein patch angioplasty left popliteal artery, intraoperative arteriogram, left leg 4 compartment fasciotomy  Preoperative diagnosis: Bilateral lower extremity ischemia left greater than right  Postoperative diagnosis: Same  Anesthesia: Gen.  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 The patient's placed in supine position on operating table. After induction of general anesthesia and endotracheal intubation, a Foley cath was placed. Next a longitudinal incision was made in the left groin through a pre-existing scar. The incision was carried into the subcutaneous tissues down to level of the pre-existing Dacron graft to the left femoral artery. This was dissected free circumferentially. Dissection was carried down to level of the profunda femoris and superficial femoral arteries. These were also dissected free circumferentially. These were fairly small. Approximately 3-4 mm. Attention was then turned to the right groin. In similar fashion a longitudinal incision was made carried of the saphenous tissues down to level the graft. This was dissected free circumferentially. The common femoral artery profunda femoris and superficial femoral arteries were also dissected free circumferentially. The vessels in the right groin were quite small. The common femoral artery was approximately 3 mm in diameter in the  profundus and superficial femoral arteries approximately 2 mm in diameter. The patient was given 10,000 units of intravenous heparin. The left limb of aortobifemoral graft was controlled with a Henley clamp. The profunda and superficial femoral arteries controlled with vessel loops. A longitudinal opening was made in the hood of the left limb of aortobifemoral graft. There was fresh-appearing thrombus within this. A number 5 Fogarty was passed up the iliac limb multiple passes until all thrombotic material was removed and there was excellent arterial inflow. 2 clean passes were obtained.  The limb was clamped proximally. The foot of the graft was opened longitudinally down in the common femoral anastomosis. There is dense intimal hyperplasia in this area that was essentially totally occluding the common femoral artery. Some of this was debrided away. #4 for a cath was passed down the profunda and superficial femoral arteries and some thrombus retrieved. 2 clean passes were obtained. A Dacron patch was brought in operative field and the left common femoral artery had a patch angioplasty constructed using a running 6-0 Prolene suture. At completion anastomosis was forebled backbled and thoroughly flushed the anastomosis was secured clamps released was pulsatile flow femoral artery immediately. Attention was then turned back to the right groin. In similar fashion a longitudinal opening was made in the right limb of the graft. However this had a much more chronic appearance. I was only able to pass the 5 Fogarty 20 cm and the graft. I was able to achieve some inflow from the native circulation. However the flow to the right limb of aortobifemoral graft could not be restored. A longitudinal opening was made extending down the common femoral artery on the right side. There was good backbleeding from the profunda superficial femoral arteries. An 8 mm Dacron graft was brought up in the operative field and sewn end of graft  to side  of the right common femoral artery using a running 6-0 Prolene suture. At completion anastomosis as is for blood back bled and thoroughly flushed. Anastomosis was secured clamps released and the graft had good backbleeding into it. The graft was clamped just above the anastomosis and tunneled over to the left groin subcutaneously. The graft was then cut to length a longitudinal opening was made in the previously constructed patch angioplasty with proximal and distal control. The graft was fashioned in a end of graft to side of patch angioplasty anastomosis was created using running 5-0 Prolene suture. Just prior completion anastomosis was forebled backbled and thoroughly flushed the anastomosis was secured clamps released good pulsatile flow in femoral-femoral bypass immediately. The patient had a posterior tibial Doppler signal in the right foot. The patient still had no Doppler signals in the left foot. At this point an incision was made on the medial aspect the left leg below the knee. The incision was carried into subcutaneous tissues down to level saphenous vein. Several small side branches the saphenous vein were ligated and divided between silk ties and then reflected posteriorly. The incision was deepened through the fascia and the below-knee popliteal space entered. The below-knee popliteal artery was dissected free circumferentially. It had no pulse in it. Dissection was carried down to the level of the take off of the anterior tibial and tibioperoneal trunk. Vessel loops placed around this. A vessel loop was used for proximal control the proximal popliteal artery. A longitudinal opening was made in the popliteal artery. There is some thrombus within this and this was removed and direct vision. A number 3 Fogarty catheter was then passed down the anterior tibial and tibioperoneal trunk multiple passes and a large amount of thrombus was retrieved. There was some backbleeding from the vessels at this  time. 2 clean passes were obtained These are thoroughly flushed with heparinized saline. A small portion of saphenous vein was harvested and opened longitudinally and placed in reversed configuration. This was then sewn on as a patch angioplasty using running 6-0 Prolene suture. The vessel was removed and was pulsatile flow in the popliteal artery this point. There was good Doppler flow the popliteal artery as well. However there were still no Doppler flow in the left foot. At this point I decided to proceed with an arteriogram and a 21-gauge butterfly needle was inserted into the left common femoral artery. An arteriogram was performed inflow occlusion. However all vessels were poorly opacified and looked like primarily due to poor timing of contrast and taking of the image. However the patient still did not have good Doppler signals in the left foot so I decided to reexplore the popliteal artery. The pre-existing patch was taken down completely. Patient had been redosed with heparin. #3 Fogarty catheter was passed down the anterior tibial and tibioperoneal trunk again multiple times. Additional thrombus was retrieved. There was again good backbleeding. 2 more clean passes were obtained. An additional piece of greater saphenous vein was then opened longitudinally reversed and sewn on as a patch angioplasty. Just prior completion anastomosis was forebled backbled and thoroughly flushed the anastomosis was secured clamps released was pulsatile flow in the popliteal artery. At this point there was also a anterior tibial Doppler signal at the level of the ankle. I was still unable to find a posterior tibial Doppler signal. Next a fasciotomy was created in the anterior and lateral compartments through a lateral incision. Posterior deep compartments are then fully decompressed to the medial incision in  the leg. The muscle was pink and viable. A fasciotomy was not performed in the right leg since this was a more  chronically ischemic leg and the compartments were soft overall. Next hemostasis was obtained and the incisions. Patient was given 100 mg of protamine. The groins were both closed in multiple layers running 2 0 3 0 Vicryl suture a 4 0 Vicryl subcuticular stitch in the skin. The below-knee popliteal incision was closed with a running 3-0 Vicryl suture followed by staples. A dry dressing was applied the left leg fasciotomy.  Patient tolerated the procedure well there were complications. Prognosis for the left limb is still guarded. He did have a Doppler signal in the left and right foot at conclusion of the case.  Fabienne Bruns, MD [facility] Office: [phone] Pager: [phone]

## 2013-08-31 NOTE — Anesthesia Postprocedure Evaluation (Signed)
Anesthesia Post Note  Patient: Leida LauthRobert Abair  Procedure(s) Performed: Procedure(s) (LRB): Left Iliac Thrombectomy, Left and Right Femoral Endarterectomy, Left to Right Femoral By Pass Graft, Right Iliac Thrombectomy, Left Popliteal and Left Tibial Embolectomy, Patch Angioplasty of Left Common Femoral Artery.  Four Compartment Fasciotomy and Arteriogram (Bilateral)  Anesthesia type: General  Patient location: PACU  Post pain: Pain level controlled and Adequate analgesia  Post assessment: Post-op Vital signs reviewed, Patient's Cardiovascular Status Stable, Respiratory Function Stable, Patent Airway and Pain level controlled  Last Vitals:  Filed Vitals:   08/31/13 1745  BP: 146/91  Pulse: 72  Temp:   Resp: 21    Post vital signs: Reviewed and stable  Level of consciousness: awake, alert  and oriented  Complications: No apparent anesthesia complications

## 2013-08-31 NOTE — Anesthesia Preprocedure Evaluation (Signed)
Anesthesia Evaluation  Patient identified by MRN, date of birth, ID band Patient awake    Reviewed: Allergy & Precautions, H&P , NPO status , Patient's Chart, lab work & pertinent test results  Airway Mallampati: I TM Distance: >3 FB Neck ROM: Full    Dental   Pulmonary Current Smoker,          Cardiovascular hypertension, Pt. on medications     Neuro/Psych    GI/Hepatic GERD-  Medicated and Controlled,  Endo/Other  diabetes, Type 2, Oral Hypoglycemic Agents  Renal/GU      Musculoskeletal   Abdominal   Peds  Hematology   Anesthesia Other Findings   Reproductive/Obstetrics                           Anesthesia Physical Anesthesia Plan  ASA: III  Anesthesia Plan: General   Post-op Pain Management:    Induction: Intravenous  Airway Management Planned: Oral ETT  Additional Equipment: Arterial line, CVP and Ultrasound Guidance Line Placement  Intra-op Plan:   Post-operative Plan: Possible Post-op intubation/ventilation  Informed Consent: I have reviewed the patients History and Physical, chart, labs and discussed the procedure including the risks, benefits and alternatives for the proposed anesthesia with the patient or authorized representative who has indicated his/her understanding and acceptance.     Plan Discussed with: CRNA and Surgeon  Anesthesia Plan Comments:         Anesthesia Quick Evaluation

## 2013-08-31 NOTE — Progress Notes (Signed)
On arrival in PACU pt has very weak water hammer type doppler signal AT left foot no other doppler signals in the left foot.  Most likely he has thrombosed outflow of this foot.  Hopefully will improve but most likely will lose this doppler signal over the next few hours.  Nothing further can be done to improve flow to the left foot.  Right foot has a good posterior tibial doppler signal.  Fabienne Brunsharles Lyan Moyano, MD [facility] [place] Office: [phone] Pager: [phone]

## 2013-08-31 NOTE — Progress Notes (Addendum)
Received Diabetes Coordinator consult this AM.  [facility] in surgery.  [facility] daily, 70/30 insulin 8-12 units as needed, Glipizide 5 mg daily, and Metformin 1000 mg BID at home. Will attempt to see patient today if back in room and alert, but will follow up tomorrow if unable to see today.  HgbA1C is 9.7%. Smith MinceKendra Marquise Lambson RN BSN CDE

## 2013-08-31 NOTE — Interval H&P Note (Signed)
History and Physical Interval Note:  08/31/2013 8:33 AM  [name] [name]  has presented today for surgery, with the diagnosis of BLOOD CLOT  The various methods of treatment have been discussed with the patient and family. After consideration of risks, benefits and other options for treatment, the patient has consented to  Procedure(s): THROMBECTOMY AORTA BIFEM, POSSIABLE BYPASS GRAFT AXILLA-BIFEMORAL (N/A) as a surgical intervention .  The patient's history has been reviewed, patient examined, no change in status, stable for surgery.  I have reviewed the patient's chart and labs.  Questions were answered to the patient's satisfaction.     Aminat Shelburne E

## 2013-09-01 ENCOUNTER — Encounter (HOSPITAL_COMMUNITY): Payer: Self-pay | Admitting: Vascular Surgery

## 2013-09-01 DIAGNOSIS — Z48812 Encounter for surgical aftercare following surgery on the circulatory system: Secondary | ICD-10-CM

## 2013-09-01 LAB — CBC
HEMATOCRIT: 24.1 % — AB (ref 39.0–52.0)
HEMOGLOBIN: 8.2 g/dL — AB (ref 13.0–17.0)
MCH: 28.2 pg (ref 26.0–34.0)
MCHC: 34 g/dL (ref 30.0–36.0)
MCV: 82.8 fL (ref 78.0–100.0)
Platelets: 115 10*3/uL — ABNORMAL LOW (ref 150–400)
RBC: 2.91 MIL/uL — AB (ref 4.22–5.81)
RDW: 15.2 % (ref 11.5–15.5)
WBC: 8.2 10*3/uL (ref 4.0–10.5)

## 2013-09-01 LAB — BASIC METABOLIC PANEL
BUN: 9 mg/dL (ref 6–23)
CALCIUM: 8.1 mg/dL — AB (ref 8.4–10.5)
CO2: 24 mEq/L (ref 19–32)
Chloride: 99 mEq/L (ref 96–112)
Creatinine, Ser: 0.85 mg/dL (ref 0.50–1.35)
GFR calc Af Amer: >90 mL/min (ref >90)
GFR calc non Af Amer: >90 mL/min (ref >90)
GLUCOSE: 247 mg/dL — AB (ref 70–99)
Potassium: 3.1 mEq/L — ABNORMAL LOW (ref 3.7–5.3)
Sodium: 138 mEq/L (ref 137–147)

## 2013-09-01 LAB — HEMOGLOBIN A1C
Hgb A1c MFr Bld: 8.7 % — ABNORMAL HIGH (ref <5.7)
Mean Plasma Glucose: 203 mg/dL — ABNORMAL HIGH (ref <117)

## 2013-09-01 LAB — GLUCOSE, CAPILLARY
GLUCOSE-CAPILLARY: 174 mg/dL — AB (ref 70–99)
Glucose-Capillary: 301 mg/dL — ABNORMAL HIGH (ref 70–99)
Glucose-Capillary: 235 mg/dL — ABNORMAL HIGH (ref 70–99)
Glucose-Capillary: 145 mg/dL — ABNORMAL HIGH (ref 70–99)
Glucose-Capillary: 135 mg/dL — ABNORMAL HIGH (ref 70–99)

## 2013-09-01 MED ORDER — INSULIN ASPART 100 UNIT/ML ~~LOC~~ SOLN
0.0000 [IU] | Freq: Three times a day (TID) | SUBCUTANEOUS | Status: DC
  Administered 2013-09-01: 3 [IU] via SUBCUTANEOUS
  Administered 2013-09-01 – 2013-09-03 (×3): 4 [IU] via SUBCUTANEOUS
  Administered 2013-09-05 – 2013-09-07 (×4): 3 [IU] via SUBCUTANEOUS
  Administered 2013-09-07: 4 [IU] via SUBCUTANEOUS
  Administered 2013-09-07: 7 [IU] via SUBCUTANEOUS
  Administered 2013-09-08 (×2): 4 [IU] via SUBCUTANEOUS
  Administered 2013-09-08: 7 [IU] via SUBCUTANEOUS
  Administered 2013-09-09: 4 [IU] via SUBCUTANEOUS

## 2013-09-01 MED ORDER — ONDANSETRON HCL 4 MG/2ML IJ SOLN: 4.0000 mg | Freq: Four times a day (QID) | INTRAMUSCULAR | Status: DC | PRN

## 2013-09-01 MED ORDER — DIPHENHYDRAMINE HCL 50 MG/ML IJ SOLN
12.5000 mg | Freq: Four times a day (QID) | INTRAMUSCULAR | Status: DC | PRN
Start: 2013-09-01 — End: 2013-09-04
  Administered 2013-09-03: via INTRAVENOUS
  Administered 2013-09-03: 12.5 mg via INTRAVENOUS
  Filled 2013-09-01 (×2): qty 1

## 2013-09-01 MED ORDER — DIPHENHYDRAMINE HCL 12.5 MG/5ML PO ELIX
12.5000 mg | ORAL_SOLUTION | Freq: Four times a day (QID) | ORAL | Status: DC | PRN
  Administered 2013-09-01: 12.5 mg via ORAL
  Filled 2013-09-01 (×2): qty 5

## 2013-09-01 MED ORDER — WHITE PETROLATUM GEL
Status: DC | PRN
  Administered 2013-09-03: 0.2 via TOPICAL
  Filled 2013-09-01 (×2): qty 28.35

## 2013-09-01 MED ORDER — ALPRAZOLAM 0.25 MG PO TABS
1.0000 mg | ORAL_TABLET | Freq: Two times a day (BID) | ORAL | Status: DC | PRN
  Administered 2013-09-01 – 2013-09-09 (×9): 1 mg via ORAL
  Filled 2013-09-01: qty 4
  Filled 2013-09-01: qty 2
  Filled 2013-09-01 (×10): qty 4

## 2013-09-01 MED ORDER — MORPHINE SULFATE (PF) 1 MG/ML IV SOLN
INTRAVENOUS | Status: DC
  Administered 2013-09-01: 13 mg via INTRAVENOUS
  Administered 2013-09-01: 6 mg via INTRAVENOUS
  Administered 2013-09-01: 09:00:00 via INTRAVENOUS
  Administered 2013-09-01: 1 mL via INTRAVENOUS
  Administered 2013-09-02: 14 mg via INTRAVENOUS
  Administered 2013-09-02: 6 mg via INTRAVENOUS
  Administered 2013-09-02: 09:00:00 via INTRAVENOUS
  Administered 2013-09-02: 11 mg via INTRAVENOUS
  Administered 2013-09-03: 14:00:00 via INTRAVENOUS
  Administered 2013-09-03: 2 mg via INTRAVENOUS
  Administered 2013-09-03: 8 mg via INTRAVENOUS
  Administered 2013-09-03: 3 mg via INTRAVENOUS
  Administered 2013-09-04: 08:00:00 via INTRAVENOUS
  Administered 2013-09-04: 12 mg via INTRAVENOUS
  Filled 2013-09-01 (×7): qty 25

## 2013-09-01 MED ORDER — NALOXONE HCL 0.4 MG/ML IJ SOLN: 0.4000 mg | INTRAMUSCULAR | Status: DC | PRN

## 2013-09-01 MED ORDER — SODIUM CHLORIDE 0.9 % IJ SOLN: 9.0000 mL | INTRAMUSCULAR | Status: DC | PRN

## 2013-09-01 NOTE — ED Provider Notes (Signed)
Medical screening examination/treatment/procedure(s) were performed by non-physician practitioner and as supervising physician I was immediately available for consultation/collaboration.   EKG Interpretation   Date/Time:  Tuesday August 30 2013 17:02:20 EST Ventricular Rate:  66 PR Interval:  140 QRS Duration: 89 QT Interval:  394 QTC Calculation: 413 R Axis:   14 Text Interpretation:  Sinus rhythm Abnormal R-wave progression, late  transition No significant change since last tracing Confirmed by Azriel Dancy,   DO, Augustine Leverette [phone]) on 08/30/2013 6:19:14 PM        Layla MawKristen N Aigner Horseman, DO 09/01/13 2340

## 2013-09-01 NOTE — Progress Notes (Addendum)
[facility] [place]  Subjective  - Left foot pain   Objective 155/78 108 97.9 F (36.6 C) (Oral) 18 99%  Intake/Output Summary (Last 24 hours) at 09/01/13 0836 Last data filed at 09/01/13 0645  Gross per 24 hour  Intake   8335 ml  Output   3250 ml  Net   5085 ml   Groin incisions no hematoma, sore left thigh and foot Left foot cool right foot warm dP PT doppler right  No doppler left Fasciotomy dressing dry  CBC    Component Value Date/Time   WBC 8.2 09/01/2013 0355   RBC 2.91* 09/01/2013 0355   HGB 8.2* 09/01/2013 0355   HCT 24.1* 09/01/2013 0355   PLT 115* 09/01/2013 0355   MCV 82.8 09/01/2013 0355   MCH 28.2 09/01/2013 0355   MCHC 34.0 09/01/2013 0355   RDW 15.2 09/01/2013 0355   LYMPHSABS 2.6 08/30/2013 1824   MONOABS 0.5 08/30/2013 1824   EOSABS 1.4* 08/30/2013 1824   BASOSABS 0.1 08/30/2013 1824    BMET    Component Value Date/Time   NA 138 09/01/2013 0355   K 3.1* 09/01/2013 0355   CL 99 09/01/2013 0355   CO2 24 09/01/2013 0355   GLUCOSE 247* 09/01/2013 0355   BUN 9 09/01/2013 0355   CREATININE 0.85 09/01/2013 0355   CALCIUM 8.1* 09/01/2013 0355   GFRNONAA >90 09/01/2013 0355   GFRAA >90 09/01/2013 0355      Assessment/Planning: Hypokalemia replete Hyperglycemia sliding scale Left foot ischemia left BKA vs AKA on Monday Acute blood loss anemia asymptomatic transfuse if less than 8 Fasciotomy wound VAC today Pain control PCA Transfer 2W  [name] [name],[name] [name] 09/01/2013 8:36 AM --  Laboratory Lab Results:  Recent Labs  08/31/13 0745 09/01/13 0355  WBC 10.0 8.2  HGB 12.4* 8.2*  HCT 36.0* 24.1*  PLT 154 115*   BMET  Recent Labs  08/31/13 0745 09/01/13 0355  NA 139 138  K 3.0* 3.1*  CL 98 99  CO2 25 24  GLUCOSE 239* 247*  BUN 11 9  CREATININE 0.89 0.85  CALCIUM 9.3 8.1*    COAG Lab Results  Component Value Date   INR 0.99 08/30/2013   INR 1.36 01/12/2012   INR 1.02 01/08/2012   No results found for this basename: PTT

## 2013-09-01 NOTE — Progress Notes (Signed)
Nutrition Brief Note  Patient identified on the Malnutrition Screening Tool (MST) Report for recent weight lost without trying (patient unsure).  Per weight readings, patient's weight has been trending up.  Wt Readings from Last 15 Encounters:  08/31/13 251 lb 12.3 oz (114.2 kg)  08/31/13 251 lb 12.3 oz (114.2 kg)  04/06/13 236 lb 12.8 oz (107.412 kg)  03/24/13 241 lb (109.317 kg)  04/01/12 226 lb (102.513 kg)  03/11/12 219 lb (99.338 kg)  02/26/12 219 lb (99.338 kg)  02/12/12 212 lb 6.4 oz (96.344 kg)  01/29/12 213 lb (96.616 kg)  01/22/12 213 lb 13.5 oz (97 kg)  01/22/12 216 lb 11.2 oz (98.294 kg)  01/16/12 227 lb (102.967 kg)  01/16/12 227 lb (102.967 kg)  01/08/12 223 lb (101.152 kg)  01/07/12 224 lb (101.606 kg)    Body mass index is 32.31 kg/(m^2). Patient meets criteria for Obesity Class I based on current BMI.   Current diet order is Heart Healthy/Carbohydrate Modified.  Patient reports a good appetite; consuming approximately 75% of meals at this time. Labs and medications reviewed.   No nutrition interventions warranted at this time. If nutrition issues arise, please consult RD.   Maureen ChattersKatie Verner Mccrone, RD, LDN Pager #: [phone] After-Hours Pager #: [phone]

## 2013-09-01 NOTE — Progress Notes (Addendum)
Inpatient Diabetes Program Recommendations  AACE/ADA: New Consensus Statement on Inpatient Glycemic Control (2013)  Target Ranges:  Prepandial:   less than 140 mg/dL      Peak postprandial:   less than 180 mg/dL (1-2 hours)      Critically ill patients:  140 - 180 mg/dL   Reason for Visit: Spoke to patient regarding diabetes management.  He was regretful of not taking better care of himself and that he will need a Left BKA.  He states that he takes Lantus 40 units daily.  He also takes 70/30 8-12 units prn with meals.  Discussed concept of basal and bolus insulin with patient. He states that his CBG's in the mornings are usually in the 150's however throughout the day they rise.  May consider adding Novolog meal coverage 6 units tid with meals to cover CHO content of meals if CBG's at lunch and supper are greater than 180 mg/dL.   Wife states that he is interested in seeing a specialist for his diabetes after discharge.  They also admit that they do not eat a diabetes diet and would like to see a dietician.  Will follow. Beryl MeagerJenny Ji Feldner, RN, BC-ADM Inpatient Diabetes Coordinator Pager [phone]

## 2013-09-01 NOTE — Progress Notes (Signed)
Pt transferred to 2W32 per MD order. Report called to receiving nurse and all questions answered.  

## 2013-09-01 NOTE — Progress Notes (Signed)
Received to 2w 32. Assessed per flow sheet. Denies chest pain or shortness of breath. Remains on morphine PCA; re-instructed use. C/o 9/10 pain. Oxy IR given per order. Medications provided. VSS. Call bell and family near.[name] LeversBaldwin, [name] [name]

## 2013-09-01 NOTE — Progress Notes (Signed)
VASCULAR LAB PRELIMINARY  ARTERIAL  ABI completed:    RIGHT    LEFT    PRESSURE WAVEFORM  PRESSURE WAVEFORM  BRACHIAL Per pt, BP cannot be taken in this arm  BRACHIAL 131 Tri   DP   DP    AT 84 Bi AT absent   PT 86 Bi PT Very minimal flow   PER   PER    GREAT TOE  NA GREAT TOE  NA    RIGHT LEFT  ABI 0.66 NA     Farrel DemarkJill Eunice, RDMS, RVT  09/01/2013, 12:03 PM

## 2013-09-01 NOTE — Evaluation (Signed)
Physical Therapy Evaluation Patient Details Name: [name] [name] MRN: [id number] DOB: 1965/12/13 Today's Date: 09/01/2013 Time: 4098-1191 PT Time Calculation (min): 28 min  PT Assessment / Plan / Recommendation History of Present Illness  48 yo, s/p left CEA and ABF by dr Darrick Penna in 2013.  Patient developed left leg pain and coldness which was severe and not relieved on Saturday.  Pt is s/p THROMBECTOMY AORTA BIFEM, POSSIABLE BYPASS GRAFT AXILLA-BIFEMORAL   Clinical Impression  Patient is s/p Lt THROMBECTOMY AORTA BIFEM, POSSIABLE BYPASS GRAFT AXILLA-BIFEMORAL  surgery resulting in functional limitations due to the deficits listed below (see PT Problem List).  Patient will benefit from skilled PT to increase their independence and safety with mobility to allow discharge to the venue listed below. Pt limited in mobility secondary to pain. MD came into room and is now planning possible Lt BKA vs AKA for next week. Pt very upset and asking many questions regarding rehab and prosthesis during session. Pt very motivated to work with therapy and become independent. Will cont to follow per POC at this time but may need to re-assess if BKA vs AKA occurs. Wife is planning to measure doorways at apartment incase wheelchair is needed.      PT Assessment  Patient needs continued PT services    Follow Up Recommendations  Home health PT;Supervision/Assistance - 24 hour    Does the patient have the potential to tolerate intense rehabilitation      Barriers to Discharge        Equipment Recommendations  Rolling walker with 5" wheels;3in1 (PT);Wheelchair (measurements PT);Wheelchair cushion (measurements PT);Other (comment) (possible wheelchairs needed if BKA is done monday )    Recommendations for Other Services OT consult   Frequency Min 3X/week    Precautions / Restrictions Precautions Precautions: Fall Restrictions Weight Bearing Restrictions: No   Pertinent Vitals/Pain 6/10; premedicated by RN.       Mobility  Bed Mobility General bed mobility comments: not assessed; pt sitting in chair and returned to chair  Transfers Overall transfer level: Needs assistance Equipment used: Rolling walker (2 wheeled) Transfers: Sit to/from Stand Sit to Stand: Min assist General transfer comment: cues for hand placement and sequencing; unable to fully WB through Lt LE due to pain; incr time required due to pain  Ambulation/Gait Ambulation/Gait assistance: Min assist Ambulation Distance (Feet): 25 Feet Assistive device: Rolling walker (2 wheeled) Gait Pattern/deviations: Decreased stance time - left;Decreased step length - right;Trunk flexed;Wide base of support;Antalgic Gait velocity: very decreased due to pain  Gait velocity interpretation: Below normal speed for age/gender General Gait Details: pt unable to acheive full heel strike and foot flat on Lt LE due to pain; cues for upright posture and sequencing; pt fatigues quickly. HR at 130 with ambulating     Exercises General Exercises - Lower Extremity Ankle Circles/Pumps: AROM;Both;Strengthening;10 reps   PT Diagnosis: Difficulty walking;Acute pain;Generalized weakness  PT Problem List: Decreased strength;Decreased activity tolerance;Decreased balance;Decreased mobility;Decreased range of motion;Decreased cognition;Decreased knowledge of use of DME;Decreased safety awareness;Pain;Impaired sensation PT Treatment Interventions: Gait training;DME instruction;Therapeutic activities;Functional mobility training;Therapeutic exercise;Balance training;Neuromuscular re-education;Patient/family education     PT Goals(Current goals can be found in the care plan section) Acute Rehab PT Goals Patient Stated Goal: to get better PT Goal Formulation: With patient Time For Goal Achievement: 09/15/13 Potential to Achieve Goals: Good  Visit Information  Last PT Received On: 09/01/13 Assistance Needed: +1 History of Present Illness: 48 yo, s/p left  CEA and ABF by dr Darrick Penna in 2013.  Patient  developed left leg pain and coldness which was severe and not relieved on Saturday.  Pt is s/p THROMBECTOMY AORTA BIFEM, POSSIABLE BYPASS GRAFT AXILLA-BIFEMORAL        Prior Functioning  Home Living Family/patient expects to be discharged to:: Private residence Living Arrangements: Spouse/significant other Available Help at Discharge: Family;Available 24 hours/day Type of Home: Apartment Home Access: Level entry Home Layout: One level Home Equipment: None Prior Function Level of Independence: Independent Communication Communication: Expressive difficulties Dominant Hand: Right    Cognition  Cognition Arousal/Alertness: Awake/alert Behavior During Therapy: Flat affect Overall Cognitive Status: Impaired/Different from baseline Area of Impairment: Safety/judgement;Problem solving;Attention Current Attention Level: Alternating Safety/Judgement: Decreased awareness of deficits;Decreased awareness of safety Problem Solving: Slow processing;Difficulty sequencing;Requires verbal cues;Requires tactile cues    Extremity/Trunk Assessment Upper Extremity Assessment Upper Extremity Assessment: Defer to OT evaluation Lower Extremity Assessment Lower Extremity Assessment: LLE deficits/detail LLE Deficits / Details: Lt knee 2+/5; limited by pain; decr sensation LLE: Unable to fully assess due to pain LLE Sensation: decreased light touch Cervical / Trunk Assessment Cervical / Trunk Assessment: Normal   Balance Balance Overall balance assessment: Needs assistance Standing balance support: During functional activity;Bilateral upper extremity supported Standing balance-Leahy Scale: Poor Standing balance comment: unable to weightshift to Left due to pain  General Comments General comments (skin integrity, edema, etc.): ACE bandage appeared to be clean at surgical site  End of Session PT - End of Session Equipment Utilized During Treatment: Gait  belt Activity Tolerance: Patient tolerated treatment well Patient left: in chair;with call bell/phone within reach;with family/visitor present Nurse Communication: Mobility status;Patient requests pain meds;Precautions  GP     Donell SievertWest, Jaquil Todt N, [place] CarolinaPT [phone] 09/01/2013, 11:27 AM

## 2013-09-01 NOTE — Evaluation (Signed)
Occupational Therapy Evaluation Patient Details Name: [name] [name] MRN: [id number] DOB: Nov 09, 1965 Today's Date: 09/01/2013 Time: 1436-1500 OT Time Calculation (min): 24 min  OT Assessment / Plan / Recommendation History of present illness 48 yo, s/p left CEA and ABF by dr Darrick Penna in 2013.  Patient developed left leg pain and coldness which was severe and not relieved on Saturday.  Pt is s/p THROMBECTOMY AORTA BIFEM, POSSIABLE BYPASS GRAFT AXILLA-BIFEMORAL Pt reports he will have L BKA on 09/05/13.   Clinical Impression   Pt presents with somewhat erratic behavior, joking and then tearful.  He states he is mean normally. Decreased attention and safety awareness.  Thought process is random.  Pt currently requires min assist for mobility and supervision to moderate assist for ADL.  Mobility is limited by pain.  Will follow acutely and determine discharge recommendations after surgery on Monday 09/05/13.    OT Assessment  Patient needs continued OT Services    Follow Up Recommendations   (TBD after surgery on 09/05/13)    Barriers to Discharge      Equipment Recommendations  3 in 1 bedside comode;Tub/shower bench    Recommendations for Other Services    Frequency  Min 2X/week    Precautions / Restrictions Precautions Precautions: Fall   Pertinent Vitals/Pain L LE, on PCA, VSS   ADL  Eating/Feeding: Independent Where Assessed - Eating/Feeding: Edge of bed Grooming: Wash/dry hands;Wash/dry face;Min guard Where Assessed - Grooming: Unsupported standing Upper Body Bathing: Supervision/safety Where Assessed - Upper Body Bathing: Unsupported sitting Lower Body Bathing: Moderate assistance Where Assessed - Lower Body Bathing: Unsupported sitting;Supported sit to stand Upper Body Dressing: Supervision/safety Where Assessed - Upper Body Dressing: Unsupported sitting Lower Body Dressing: Moderate assistance Where Assessed - Lower Body Dressing: Unsupported sitting;Supported sit to stand Toilet  Transfer: Minimal assistance Toilet Transfer Method: Sit to stand Equipment Used: Gait belt;Rolling walker Transfers/Ambulation Related to ADLs: min assist, put little weight on L LE ADL Comments: difficulty accessing feet due to obesity and decreased L LE movement    OT Diagnosis: Generalized weakness;Acute pain;Cognitive deficits  OT Problem List: Decreased strength;Decreased activity tolerance;Impaired balance (sitting and/or standing);Decreased knowledge of use of DME or AE;Decreased cognition;Decreased safety awareness;Obesity;Pain OT Treatment Interventions: Self-care/ADL training;DME and/or AE instruction;Balance training;Patient/family education   OT Goals(Current goals can be found in the care plan section) Acute Rehab OT Goals Patient Stated Goal: to get better OT Goal Formulation: With patient Time For Goal Achievement: 09/15/13 Potential to Achieve Goals: Good ADL Goals Pt Will Perform Grooming: with supervision;standing Pt Will Perform Lower Body Bathing: with min guard assist;sit to/from stand Pt Will Perform Lower Body Dressing: with min guard assist;sit to/from stand Pt Will Transfer to Toilet: with min guard assist;ambulating;bedside commode Pt Will Perform Toileting - Clothing Manipulation and hygiene: with min guard assist;sit to/from stand Pt Will Perform Tub/Shower Transfer: Tub transfer;ambulating;tub bench;with min assist;rolling walker  Visit Information  Last OT Received On: 09/01/13 Assistance Needed: +1 History of Present Illness: 48 yo, s/p left CEA and ABF by dr Darrick Penna in 2013.  Patient developed left leg pain and coldness which was severe and not relieved on Saturday.  Pt is s/p THROMBECTOMY AORTA BIFEM, POSSIABLE BYPASS GRAFT AXILLA-BIFEMORAL        Prior Functioning     Home Living Family/patient expects to be discharged to:: Private residence Living Arrangements: Spouse/significant other Available Help at Discharge: Family;Available 24  hours/day Type of Home: Apartment Home Access: Level entry Home Layout: One level Home Equipment: None Prior Function  Level of Independence: Independent Communication Communication: Expressive difficulties (thought process difficult to follow) Dominant Hand: Right         Vision/Perception Vision - History Baseline Vision: Wears glasses only for reading Patient Visual Report: No change from baseline   Cognition  Cognition Arousal/Alertness: Awake/alert Behavior During Therapy:  (tearful at times and then joking) Overall Cognitive Status: Impaired/Different from baseline Area of Impairment: Safety/judgement;Problem solving;Attention Current Attention Level: Alternating Safety/Judgement: Decreased awareness of deficits;Decreased awareness of safety Problem Solving: Slow processing;Difficulty sequencing;Requires verbal cues;Requires tactile cues    Extremity/Trunk Assessment Upper Extremity Assessment Upper Extremity Assessment: Overall WFL for tasks assessed Lower Extremity Assessment Lower Extremity Assessment: Defer to PT evaluation Cervical / Trunk Assessment Cervical / Trunk Assessment: Normal     Mobility Bed Mobility Overal bed mobility: Needs Assistance Bed Mobility: Sit to Supine;Supine to Sit Supine to sit: Supervision Sit to supine: Min assist General bed mobility comments: assist for L LE up in bed Transfers Overall transfer level: Needs assistance Equipment used: Rolling walker (2 wheeled) Transfers: Sit to/from Stand Sit to Stand: Min assist General transfer comment: cues for hand placement and sequencing; unable to fully WB through Lt LE due to pain; increased time required due to pain      Exercise     Balance     End of Session OT - End of Session Activity Tolerance: Patient limited by pain Patient left: in bed;with call bell/phone within reach Nurse Communication: Other (comment) (pt's behavior)  GO     [name], Jahkai Yandell Lynn 09/01/2013, 3:21  PM [phone]

## 2013-09-01 NOTE — Progress Notes (Signed)
Utilization review completed.  

## 2013-09-02 LAB — BASIC METABOLIC PANEL
BUN: 7 mg/dL (ref 6–23)
CALCIUM: 8.3 mg/dL — AB (ref 8.4–10.5)
CO2: 27 meq/L (ref 19–32)
Chloride: 95 mEq/L — ABNORMAL LOW (ref 96–112)
Creatinine, Ser: 0.95 mg/dL (ref 0.50–1.35)
GFR calc Af Amer: >90 mL/min (ref >90)
GFR calc non Af Amer: >90 mL/min (ref >90)
Glucose, Bld: 150 mg/dL — ABNORMAL HIGH (ref 70–99)
Potassium: 3 mEq/L — ABNORMAL LOW (ref 3.7–5.3)
SODIUM: 136 meq/L — AB (ref 137–147)

## 2013-09-02 LAB — GLUCOSE, CAPILLARY
GLUCOSE-CAPILLARY: 130 mg/dL — AB (ref 70–99)
Glucose-Capillary: 170 mg/dL — ABNORMAL HIGH (ref 70–99)
Glucose-Capillary: 180 mg/dL — ABNORMAL HIGH (ref 70–99)
Glucose-Capillary: 134 mg/dL — ABNORMAL HIGH (ref 70–99)

## 2013-09-02 LAB — CBC
HCT: 23.1 % — ABNORMAL LOW (ref 39.0–52.0)
Hemoglobin: 7.7 g/dL — ABNORMAL LOW (ref 13.0–17.0)
MCH: 28.2 pg (ref 26.0–34.0)
MCHC: 33.3 g/dL (ref 30.0–36.0)
MCV: 84.6 fL (ref 78.0–100.0)
Platelets: 122 10*3/uL — ABNORMAL LOW (ref 150–400)
RBC: 2.73 MIL/uL — AB (ref 4.22–5.81)
RDW: 15.1 % (ref 11.5–15.5)
WBC: 8.6 10*3/uL (ref 4.0–10.5)

## 2013-09-02 LAB — POCT I-STAT 4, (NA,K, GLUC, HGB,HCT)
Glucose, Bld: 170 mg/dL — ABNORMAL HIGH (ref 70–99)
Glucose, Bld: 214 mg/dL — ABNORMAL HIGH (ref 70–99)
Glucose, Bld: 223 mg/dL — ABNORMAL HIGH (ref 70–99)
HCT: 27 % — ABNORMAL LOW (ref 39.0–52.0)
HEMATOCRIT: 31 % — AB (ref 39.0–52.0)
HEMATOCRIT: 25 % — AB (ref 39.0–52.0)
HEMOGLOBIN: 10.5 g/dL — AB (ref 13.0–17.0)
HEMOGLOBIN: 8.5 g/dL — AB (ref 13.0–17.0)
Hemoglobin: 9.2 g/dL — ABNORMAL LOW (ref 13.0–17.0)
POTASSIUM: 3 meq/L — AB (ref 3.7–5.3)
Potassium: 2.9 mEq/L — CL (ref 3.7–5.3)
Potassium: 3.3 mEq/L — ABNORMAL LOW (ref 3.7–5.3)
SODIUM: 139 meq/L (ref 137–147)
Sodium: 138 mEq/L (ref 137–147)
Sodium: 138 mEq/L (ref 137–147)

## 2013-09-02 LAB — PREPARE RBC (CROSSMATCH)

## 2013-09-02 MED ORDER — GABAPENTIN 300 MG PO CAPS
300.0000 mg | ORAL_CAPSULE | Freq: Three times a day (TID) | ORAL | Status: DC
  Administered 2013-09-02 – 2013-09-07 (×16): 300 mg via ORAL
  Filled 2013-09-02 (×20): qty 1

## 2013-09-02 MED ORDER — CEFAZOLIN SODIUM-DEXTROSE 2-3 GM-% IV SOLR
2.0000 g | INTRAVENOUS | Status: AC
  Administered 2013-09-05: 2 g via INTRAVENOUS
  Filled 2013-09-02: qty 50

## 2013-09-02 MED ORDER — POTASSIUM CHLORIDE ER 10 MEQ PO TBCR
20.0000 meq | EXTENDED_RELEASE_TABLET | Freq: Two times a day (BID) | ORAL | Status: DC
  Administered 2013-09-02 – 2013-09-09 (×15): 20 meq via ORAL
  Filled 2013-09-02 (×16): qty 2

## 2013-09-02 MED ORDER — GABAPENTIN 600 MG PO TABS
300.0000 mg | ORAL_TABLET | Freq: Three times a day (TID) | ORAL | Status: DC
  Filled 2013-09-02: qty 0.5

## 2013-09-02 MED ORDER — CEFAZOLIN SODIUM 1-5 GM-% IV SOLN: 1.0000 g | INTRAVENOUS | Status: DC

## 2013-09-02 NOTE — Progress Notes (Signed)
Pt had fever of 101.3 one hour after blood transfusion was initiated.  MD was notified and pt was given tylenol for the temperature.  Other than the fever, the pt is asymptomatic and has no other complaints.  Will continue to monitor.

## 2013-09-02 NOTE — Progress Notes (Addendum)
[facility]  Subjective  - Pain is sever at times.   Objective 147/75 103 99 F (37.2 C) (Oral) 18 97%  Intake/Output Summary (Last 24 hours) at 09/02/13 0910 Last data filed at 09/02/13 0004  Gross per 24 hour  Intake    760 ml  Output   1750 ml  Net   -990 ml    Left foot cool to touch no doppler signals Fasciotomy site wound vac in place. dP PT doppler right, warm to touch   Assessment/Planning: POD #2 Acute blood loss anemia asymptomatic will transfuse 2 units today HGB 7.7 this am I will discuss D/C of wound vac with Dr. Darrick PennaFields.  Plan Left BKA verses AKA Monday. Continues hypokalemia 3.0 today K dur 20 meq BID changed from QD 10 meq PO.    Thomasena EdisCOLLINS, EMMA [facility]EN 09/02/2013 9:10 AM --   Agree with above Right foot warm Left foot pain controlled with PCA Left leg amputation on Monday  Fabienne Brunsharles Dilpreet Faires, MD [facility] [place] Office: [phone] Pager: [phone]  Laboratory Lab Results:  Recent Labs  09/01/13 0355 09/02/13 0355  WBC 8.2 8.6  HGB 8.2* 7.7*  HCT 24.1* 23.1*  PLT 115* 122*   BMET  Recent Labs  09/01/13 0355 09/02/13 0355  NA 138 136*  K 3.1* 3.0*  CL 99 95*  CO2 24 27  GLUCOSE 247* 150*  BUN 9 7  CREATININE 0.85 0.95  CALCIUM 8.1* 8.3*    COAG Lab Results  Component Value Date   INR 0.99 08/30/2013   INR 1.36 01/12/2012   INR 1.02 01/08/2012   No results found for this basename: PTT

## 2013-09-02 NOTE — Progress Notes (Signed)
Physical Therapy Treatment Patient Details Name: [name] LauthRobert Hilburn MRN: [id number] DOB: [birth year] Today's Date: 09/02/2013 Time: [id number] PT Time Calculation (min): 24 min  PT Assessment / Plan / Recommendation  History of Present Illness 48 yo, s/p left CEA and ABF by dr Darrick PennaFields in 2013.  Patient developed left leg pain and coldness which was severe and not relieved on Saturday.  Pt is s/p THROMBECTOMY AORTA BIFEM, POSSIABLE BYPASS GRAFT AXILLA-BIFEMORAL    PT Comments   Pt limited by lethargy and decreased level of consciousness. Pt frequently falling asleep during session.  Follow Up Recommendations  Home health PT;Supervision/Assistance - 24 hour     Does the patient have the potential to tolerate intense rehabilitation     Barriers to Discharge        Equipment Recommendations  Rolling walker with 5" wheels;3in1 (PT);Wheelchair (measurements PT);Wheelchair cushion (measurements PT);Other (comment)    Recommendations for Other Services OT consult  Frequency Min 3X/week   Progress towards PT Goals Progress towards PT goals: Not progressing toward goals - comment (2/2 level of alertness)  Plan Current plan remains appropriate    Precautions / Restrictions Precautions Precautions: Fall Restrictions Weight Bearing Restrictions: No   Pertinent Vitals/Pain Pt c/o LLE pain however did not rate.  Pt has PCA    Mobility  Bed Mobility Overal bed mobility: Needs Assistance Bed Mobility: Supine to Sit;Sit to Supine Supine to sit: Supervision Sit to supine: Supervision General bed mobility comments: increased time due to pain and slow processing    Exercises     PT Diagnosis:    PT Problem List:   PT Treatment Interventions:     PT Goals (current goals can now be found in the care plan section) Acute Rehab PT Goals Patient Stated Goal: to get better PT Goal Formulation: With patient Time For Goal Achievement: 09/15/13 Potential to Achieve Goals: Good  Visit Information  Last PT Received On: 09/02/13 Assistance Needed: +1 History of Present Illness: 48 yo, s/p left CEA and ABF by dr Darrick PennaFields in 2013.  Patient developed left leg pain and coldness which was severe and not relieved on Saturday.  Pt is s/p THROMBECTOMY AORTA BIFEM, POSSIABLE BYPASS GRAFT AXILLA-BIFEMORAL     Subjective Data  Subjective: "They're going to cut my leg off." Patient Stated Goal: to get better   Cognition  Cognition Arousal/Alertness: Lethargic;Suspect due to medications Behavior During Therapy: Restless Overall Cognitive Status: Impaired/Different from baseline Area of Impairment: Safety/judgement;Problem solving;Attention Current Attention Level: Alternating Memory: Decreased short-term memory Safety/Judgement: Decreased awareness of deficits;Decreased awareness of safety Problem Solving: Slow processing;Difficulty sequencing;Requires verbal cues;Requires tactile cues General Comments: pt. level of consciousness waxing and waning needing frequent times to awake pt.  Pt. falling asleep sitting up and during conversation.    Balance  Balance Overall balance assessment: Needs assistance Sitting-balance support: Bilateral upper extremity supported;Feet supported Sitting balance-Leahy Scale: Poor Postural control: Right lateral lean General Comments General comments (skin integrity, edema, etc.): pt. with limited tolerance during session today; suspect cognition and and arousal level due to medication  End of Session PT - End of Session Activity Tolerance: Patient limited by lethargy;Patient limited by pain;Other (comment) (treatment limited due to lethargy) Patient left: in bed;with call bell/phone within reach Nurse Communication: Mobility status;Other (comment) (level of alert)   GP     Moshe CiproMatthews, Ermine Spofford K 09/02/2013, 12:37 PM  Clarita CraneStephanie F Jermiyah Ricotta, PT, DPT [phone]

## 2013-09-03 LAB — TYPE AND SCREEN
ABO/RH(D): A POS
Antibody Screen: NEGATIVE
Unit division: 0
Unit division: 0
Unit division: 0

## 2013-09-03 LAB — GLUCOSE, CAPILLARY
GLUCOSE-CAPILLARY: 152 mg/dL — AB (ref 70–99)
GLUCOSE-CAPILLARY: 159 mg/dL — AB (ref 70–99)
Glucose-Capillary: 142 mg/dL — ABNORMAL HIGH (ref 70–99)
Glucose-Capillary: 135 mg/dL — ABNORMAL HIGH (ref 70–99)

## 2013-09-03 NOTE — Progress Notes (Signed)
Subjective: Interval History: none..   Objective: Vital signs in last 24 hours: Temp:  [97.3 F (36.3 C)-101.3 F (38.5 C)] 97.3 F (36.3 C) (03/07 0402) Pulse Rate:  [69-93] 87 (03/07 0402) Resp:  [11-20] 13 (03/07 0447) BP: (130-171)/(66-90) 156/76 mmHg (03/07 0402) SpO2:  [94 %-100 %] 100 % (03/07 0447)  Intake/Output from previous day: 03/06 0701 - 03/07 0700 In: 720 [P.O.:720] Out: 1400 [Urine:1400] Intake/Output this shift: Total I/O In: 360 [P.O.:360] Out: -   The groin wounds healing without difficulty. Back in place on the left lateral calf. Foot tender to touch.  Lab Results:  Recent Labs  09/01/13 0355 09/02/13 0355  WBC 8.2 8.6  HGB 8.2* 7.7*  HCT 24.1* 23.1*  PLT 115* 122*   BMET  Recent Labs  09/01/13 0355 09/02/13 0355  NA 138 136*  K 3.1* 3.0*  CL 99 95*  CO2 24 27  GLUCOSE 247* 150*  BUN 9 7  CREATININE 0.85 0.95  CALCIUM 8.1* 8.3*    Studies/Results: Dg Chest 2 View  08/30/2013   CLINICAL DATA:  Preop for bypass.  EXAM: CHEST  2 VIEW  COMPARISON:  01/13/2012  FINDINGS: Normal heart size and mediastinal contours. No acute infiltrate or edema. No effusion or pneumothorax. No acute osseous findings. Partial bilateral pyelograms.  IMPRESSION: No active cardiopulmonary disease.   Electronically Signed   By: Tiburcio PeaJonathan  Watts M.D.   On: 08/30/2013 22:32   Ct Angio Ao+bifem W/cm &/or Wo/cm  08/30/2013   CLINICAL DATA:  Left lower extremity pain.  Nonpalpable pulses.  EXAM: CT ANGIOGRAM ABDOMINAL AORTA AND BILATERAL LOWER EXTREMITIES WITH CONTRAST  TECHNIQUE: Axial helical CT of the abdominal aorta and bilateral lower extremities after 100ml Omnipaque 350 IV. Coronal and sagittal reconstructions were generated for vascular evaluation.  CONTRAST:  100mL OMNIPAQUE IOHEXOL 350 MG/ML SOLN  COMPARISON:  12/26/2011  FINDINGS: Arterial findings:  Aorta: The visualized distal descending and suprarenal segments are unremarkable. There has been interval placement  of an aortobifem graft, which is occluded. The native aorta occludes below the level of the inferior mesenteric artery origin.  Celiac axis:         Patent  Superior mesenteric: Patent, with replaced right hepatic arterial supply, an anatomic variant.  Left renal: Duplicated. The superior is diminutive, supplying only a portion of the upper pole. Inferior is dominant, widely patent.  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  Right iliac: Near complete occlusion of the native iliac arterial system with some collateral presumably retrograde flow through the internal iliac artery. Right limb of the aortobifem graft is occluded.  Left lower extremity: There is collateral reconstitution of the distal common femoral artery. Profunda femoris branches are patent. SFA and popliteal artery patent. The tibioperoneal trunk occludes at its origin. Anterior tibial artery patent to just above the ankle. There is collateral reconstitution of peroneal and posterior tibial arteries at the mid calf level ,neither opacified distally beyond the ankle.  Right lower extremity: Collateral reconstitution of the common femoral artery. Profunda femoris, SFA, and popliteal artery are patent, with contiguous 3 vessel tibial runoff.  Venous findings: Dedicated venous phase imaging was not obtained. Kadir Azucena opacification of portal vein and bilateral renal veins noted.  Review of the MIP images confirms the above findings.  Nonvascular findings: Minimal dependent atelectasis in the visualized lung  bases. Diffuse fatty infiltration of the liver. Unremarkable arterial phase evaluation of spleen, pancreas, kidneys, right adrenal gland. There 2 left adrenal nodules measuring 13 and 25 mm diameter, which were present on the prior study. Stomach, small bowel, and colon are nondilated. There is umbilical hernia containing  loops of small bowel without evidence of obstruction or strangulation. Urinary bladder incompletely distended. No ascites. No free air. No adenopathy evident. Degenerative disc disease L4-5 and L5-S1  IMPRESSION: 1. 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 2. Collateral reconstitution of common femoral arteries bilaterally, with patent femoral popliteal systems. 3. Three-vessel right tibial runoff. 4. Left anterior tibial runoff to above the ankle, and reconstituted posterior tibial and peroneal artery segments. 5. Umbilical hernia containing small bowel, without obstruction or strangulation.   Electronically Signed   By: Oley Balm M.D.   On: 08/30/2013 20:20   Dg Chest Port 1 View  08/31/2013   CLINICAL DATA:  Central line placement.  EXAM: PORTABLE CHEST - 1 VIEW  COMPARISON:  08/30/2013  FINDINGS: A right jugular Cordis is seen with tip overlying the proximal SVC. No evidence of pneumothorax. Low lung volumes are noted. Both lungs remain clear. Increased in the heart size is likely due to decreased lung volumes.  IMPRESSION: Right jugular Cordis in appropriate position. No pneumothorax or other acute findings identified.   Electronically Signed   By: Myles Rosenthal M.D.   On: 08/31/2013 18:27   Dg Ang/ext/uni/or Left  08/31/2013   CLINICAL DATA:  Iliac thrombectomy  EXAM: LEFT ANG/EXT/UNI/ OR  COMPARISON:  None.  FINDINGS: Opacification of collateral arterial structures in the proximal calf are identified. There is no evidence of opacification of the anterior tibial artery, posterior tibial artery, are perineal artery. No runoff to the ankle.  IMPRESSION: There is absent filling of the tibial vessels as described.   Electronically Signed   By: Maryclare Bean M.D.   On: 08/31/2013 16:21   Anti-infectives: Anti-infectives   Start     Dose/Rate Route Frequency Ordered Stop   09/05/13 0600  ceFAZolin (ANCEF) IVPB 1 g/50 mL premix  Status:  Discontinued    Comments:  Send with pt to OR    1 g 100 mL/hr over 30 Minutes Intravenous On call 09/02/13 0920 09/02/13 1352   09/05/13 0600  ceFAZolin (ANCEF) IVPB 2 g/50 mL premix    Comments:  Send with pt to OR   2 g 100 mL/hr over 30 Minutes Intravenous On call 09/02/13 1352 09/06/13 0600   09/01/13 0142  cefUROXime (ZINACEF) 1.5 g in dextrose 5 % 50 mL IVPB     1.5 g 100 mL/hr over 30 Minutes Intravenous Every 12 hours 08/31/13 2047 09/01/13 1547   08/31/13 1315  cefUROXime (ZINACEF) 1.5 g in dextrose 5 % 50 mL IVPB  Status:  Discontinued     1.5 g 100 mL/hr over 30 Minutes Intravenous To Surgery 08/31/13 1300 08/31/13 2046   08/31/13 0830  dextrose 5 % with cefUROXime (ZINACEF) ADS Med    Comments:  Marrianne Mood   : cabinet override      08/31/13 0830 08/31/13 2044      Assessment/Plan: s/p Procedure(s): Left Iliac Thrombectomy, Left and Right Femoral Endarterectomy, Left to Right Femoral By Pass Graft, Right Iliac Thrombectomy, Left Popliteal and Left Tibial Embolectomy, Patch Angioplasty of Left Common Femoral Artery.  Four Compartment Fasciotomy and Arteriogram (Bilateral) Left foot pain but tolerable with medication. Understands plan for amputation on Monday. Discussed at length  with the patient and his girlfriend present.hemoglobin and hematocrit has dropped. Will check tomorrow   LOS: 4 days   Lorrinda Ramstad 09/03/2013, 9:53 AM

## 2013-09-03 NOTE — Progress Notes (Addendum)
Patient displaying inappropiate  behavior .Patient voided in cup then picked cup up and started to take it to his mouth . I walked in  room and patient placed cup back on bedside table .Asked patient what he was doing and he stated, "nothing I just needed to pee". Condom catheter placed on patient. Cleaned patient off with soap and water prior to application of condom cath.Patient stated,'I knew you guys just wanted to look at my johnson .I know you want this." Patient girlfriend at bedside laughing at his comments.

## 2013-09-04 LAB — CBC
HCT: 27 % — ABNORMAL LOW (ref 39.0–52.0)
Hemoglobin: 9.1 g/dL — ABNORMAL LOW (ref 13.0–17.0)
MCH: 28.3 pg (ref 26.0–34.0)
MCHC: 33.7 g/dL (ref 30.0–36.0)
MCV: 84.1 fL (ref 78.0–100.0)
Platelets: 184 10*3/uL (ref 150–400)
RBC: 3.21 MIL/uL — ABNORMAL LOW (ref 4.22–5.81)
RDW: 14.2 % (ref 11.5–15.5)
WBC: 8.7 10*3/uL (ref 4.0–10.5)

## 2013-09-04 LAB — BASIC METABOLIC PANEL
BUN: 6 mg/dL (ref 6–23)
CO2: 30 mEq/L (ref 19–32)
Calcium: 8.6 mg/dL (ref 8.4–10.5)
Chloride: 96 mEq/L (ref 96–112)
Creatinine, Ser: 0.92 mg/dL (ref 0.50–1.35)
GFR calc non Af Amer: >90 mL/min (ref >90)
Glucose, Bld: 128 mg/dL — ABNORMAL HIGH (ref 70–99)
POTASSIUM: 2.8 meq/L — AB (ref 3.7–5.3)
SODIUM: 140 meq/L (ref 137–147)

## 2013-09-04 LAB — GLUCOSE, CAPILLARY
GLUCOSE-CAPILLARY: 133 mg/dL — AB (ref 70–99)
GLUCOSE-CAPILLARY: 143 mg/dL — AB (ref 70–99)
Glucose-Capillary: 117 mg/dL — ABNORMAL HIGH (ref 70–99)
Glucose-Capillary: 157 mg/dL — ABNORMAL HIGH (ref 70–99)

## 2013-09-04 MED ORDER — DIPHENHYDRAMINE HCL 12.5 MG/5ML PO ELIX
12.5000 mg | ORAL_SOLUTION | Freq: Four times a day (QID) | ORAL | Status: DC | PRN
  Filled 2013-09-04: qty 5

## 2013-09-04 MED ORDER — MORPHINE SULFATE (PF) 1 MG/ML IV SOLN
INTRAVENOUS | Status: DC
  Administered 2013-09-04 – 2013-09-05 (×5): via INTRAVENOUS
  Administered 2013-09-05: 10.5 mg via INTRAVENOUS
  Filled 2013-09-04 (×5): qty 25

## 2013-09-04 MED ORDER — NALOXONE HCL 0.4 MG/ML IJ SOLN: 0.4000 mg | INTRAMUSCULAR | Status: DC | PRN

## 2013-09-04 MED ORDER — SODIUM CHLORIDE 0.9 % IJ SOLN: 9.0000 mL | INTRAMUSCULAR | Status: DC | PRN

## 2013-09-04 MED ORDER — FENTANYL CITRATE 0.05 MG/ML IJ SOLN: 50.0000 ug | INTRAMUSCULAR | Status: DC | PRN

## 2013-09-04 MED ORDER — DIPHENHYDRAMINE HCL 50 MG/ML IJ SOLN: 12.5000 mg | Freq: Four times a day (QID) | INTRAMUSCULAR | Status: DC | PRN

## 2013-09-04 MED ORDER — POTASSIUM CHLORIDE 10 MEQ/100ML IV SOLN
10.0000 meq | INTRAVENOUS | Status: AC
  Administered 2013-09-04 (×3): 10 meq via INTRAVENOUS
  Filled 2013-09-04 (×3): qty 100

## 2013-09-04 MED ORDER — LACTATED RINGERS IV SOLN
INTRAVENOUS | Status: DC
  Administered 2013-09-04 – 2013-09-05 (×3): via INTRAVENOUS

## 2013-09-04 MED ORDER — ONDANSETRON HCL 4 MG/2ML IJ SOLN: 4.0000 mg | Freq: Four times a day (QID) | INTRAMUSCULAR | Status: DC | PRN

## 2013-09-04 NOTE — Progress Notes (Signed)
CRITICAL VALUE ALERT  Critical value received: 2.8  Date of notification:  09/04/2013 Time of notification:  0605  Critical value read back:yes  Nurse who received alert: Blanchard Maneamara Washington  MD notified (1st page):  Dr. Arbie CookeyEarly  Time of first page:  06:08  MD notified (2nd page):  Time of second page:  Responding MD:  Dr.Early  Time MD responded:  06:10

## 2013-09-04 NOTE — Progress Notes (Signed)
Patient ID: [name] LauthRobert [name], male   DOB: [birth year], 48 y.o.   MRN: [id number] Having difficulty with pain control in his left foot. Will increase his PCA to full dose morphine. Remains hemodynamically stable. Potassium low this morning we'll replace. For OR tomorrow

## 2013-09-04 NOTE — Progress Notes (Signed)
Pt non-compliant with wound-vac this morning and pulled the suction off himself.  Confirmed with Dr. Arbie CookeyEarly that this was okay to leave discontinued.  Pt also non-compliant with incentive spirometer use.  Pt refuses to do it, and was educated on the risks associated with no using IS.  Will continue to monitor.

## 2013-09-05 ENCOUNTER — Encounter (HOSPITAL_COMMUNITY): Payer: Self-pay | Admitting: Certified Registered Nurse Anesthetist

## 2013-09-05 ENCOUNTER — Inpatient Hospital Stay (HOSPITAL_COMMUNITY): Payer: Medicaid Other | Admitting: Anesthesiology

## 2013-09-05 ENCOUNTER — Encounter (HOSPITAL_COMMUNITY): Payer: Medicaid Other | Admitting: Anesthesiology

## 2013-09-05 ENCOUNTER — Encounter (HOSPITAL_COMMUNITY)
Admission: EM | Disposition: A | Discharge status: Rehabilitation Facility | Payer: Self-pay | Source: Home / Self Care | Attending: Vascular Surgery

## 2013-09-05 DIAGNOSIS — I70229 Atherosclerosis of native arteries of extremities with rest pain, unspecified extremity: Secondary | ICD-10-CM

## 2013-09-05 HISTORY — PX: AMPUTATION: SHX166

## 2013-09-05 LAB — BASIC METABOLIC PANEL
BUN: 7 mg/dL (ref 6–23)
CALCIUM: 9.3 mg/dL (ref 8.4–10.5)
CO2: 32 mEq/L (ref 19–32)
CREATININE: 0.87 mg/dL (ref 0.50–1.35)
Chloride: 92 mEq/L — ABNORMAL LOW (ref 96–112)
GFR calc non Af Amer: >90 mL/min (ref >90)
Glucose, Bld: 135 mg/dL — ABNORMAL HIGH (ref 70–99)
Potassium: 3 mEq/L — ABNORMAL LOW (ref 3.7–5.3)
Sodium: 138 mEq/L (ref 137–147)

## 2013-09-05 LAB — GLUCOSE, CAPILLARY
GLUCOSE-CAPILLARY: 148 mg/dL — AB (ref 70–99)
GLUCOSE-CAPILLARY: 143 mg/dL — AB (ref 70–99)
GLUCOSE-CAPILLARY: 177 mg/dL — AB (ref 70–99)
Glucose-Capillary: 133 mg/dL — ABNORMAL HIGH (ref 70–99)
Glucose-Capillary: 133 mg/dL — ABNORMAL HIGH (ref 70–99)
Glucose-Capillary: 144 mg/dL — ABNORMAL HIGH (ref 70–99)

## 2013-09-05 LAB — CBC
HEMATOCRIT: 28.3 % — AB (ref 39.0–52.0)
Hemoglobin: 9.7 g/dL — ABNORMAL LOW (ref 13.0–17.0)
MCH: 28.5 pg (ref 26.0–34.0)
MCHC: 34.3 g/dL (ref 30.0–36.0)
MCV: 83.2 fL (ref 78.0–100.0)
PLATELETS: 247 10*3/uL (ref 150–400)
RBC: 3.4 MIL/uL — ABNORMAL LOW (ref 4.22–5.81)
RDW: 13.8 % (ref 11.5–15.5)
WBC: 8.8 10*3/uL (ref 4.0–10.5)

## 2013-09-05 LAB — PROTIME-INR
INR: 1.14 (ref 0.00–1.49)
PROTHROMBIN TIME: 14.4 s (ref 11.6–15.2)

## 2013-09-05 SURGERY — AMPUTATION BELOW KNEE
Anesthesia: General | Site: Leg Lower | Laterality: Left

## 2013-09-05 MED ORDER — PHENYLEPHRINE HCL 10 MG/ML IJ SOLN
INTRAMUSCULAR | Status: DC | PRN
  Administered 2013-09-05 (×2): 80 ug via INTRAVENOUS
  Administered 2013-09-05 (×2): 120 ug via INTRAVENOUS
  Administered 2013-09-05 (×5): 80 ug via INTRAVENOUS

## 2013-09-05 MED ORDER — MORPHINE SULFATE 2 MG/ML IJ SOLN
INTRAMUSCULAR | Status: AC
  Filled 2013-09-05: qty 2

## 2013-09-05 MED ORDER — 0.9 % SODIUM CHLORIDE (POUR BTL) OPTIME
TOPICAL | Status: DC | PRN
  Administered 2013-09-05: 1000 mL

## 2013-09-05 MED ORDER — SUCCINYLCHOLINE CHLORIDE 20 MG/ML IJ SOLN
INTRAMUSCULAR | Status: DC | PRN
  Administered 2013-09-05: 100 mg via INTRAVENOUS

## 2013-09-05 MED ORDER — LIDOCAINE HCL (CARDIAC) 20 MG/ML IV SOLN
INTRAVENOUS | Status: DC | PRN
  Administered 2013-09-05: 40 mg via INTRAVENOUS

## 2013-09-05 MED ORDER — MORPHINE SULFATE 2 MG/ML IJ SOLN
4.0000 mg | Freq: Once | INTRAMUSCULAR | Status: AC
  Administered 2013-09-05: 4 mg via INTRAVENOUS

## 2013-09-05 MED ORDER — EPHEDRINE SULFATE 50 MG/ML IJ SOLN
INTRAMUSCULAR | Status: DC | PRN
  Administered 2013-09-05: 2.5 mg via INTRAVENOUS
  Administered 2013-09-05: 5 mg via INTRAVENOUS
  Administered 2013-09-05: 2.5 mg via INTRAVENOUS

## 2013-09-05 MED ORDER — ONDANSETRON HCL 4 MG/2ML IJ SOLN
INTRAMUSCULAR | Status: DC | PRN
  Administered 2013-09-05: 4 mg via INTRAVENOUS

## 2013-09-05 MED ORDER — PROPOFOL 10 MG/ML IV BOLUS
INTRAVENOUS | Status: DC | PRN
  Administered 2013-09-05: 40 mg via INTRAVENOUS
  Administered 2013-09-05: 160 mg via INTRAVENOUS

## 2013-09-05 MED ORDER — ATENOLOL 100 MG PO TABS
100.0000 mg | ORAL_TABLET | Freq: Every day | ORAL | Status: DC
  Administered 2013-09-06 – 2013-09-09 (×4): 100 mg via ORAL
  Filled 2013-09-05 (×4): qty 1

## 2013-09-05 MED ORDER — FENTANYL CITRATE 0.05 MG/ML IJ SOLN
INTRAMUSCULAR | Status: DC | PRN
  Administered 2013-09-05: 50 ug via INTRAVENOUS
  Administered 2013-09-05: 150 ug via INTRAVENOUS
  Administered 2013-09-05 (×2): 50 ug via INTRAVENOUS

## 2013-09-05 MED ORDER — ENOXAPARIN SODIUM 40 MG/0.4ML ~~LOC~~ SOLN
40.0000 mg | SUBCUTANEOUS | Status: DC
  Administered 2013-09-07 – 2013-09-08 (×2): 40 mg via SUBCUTANEOUS
  Filled 2013-09-05 (×4): qty 0.4

## 2013-09-05 MED ORDER — FENTANYL CITRATE 0.05 MG/ML IJ SOLN
INTRAMUSCULAR | Status: AC
  Filled 2013-09-05: qty 5

## 2013-09-05 MED ORDER — POTASSIUM CHLORIDE CRYS ER 20 MEQ PO TBCR
20.0000 meq | EXTENDED_RELEASE_TABLET | Freq: Every day | ORAL | Status: AC | PRN
  Administered 2013-09-09: 20 meq via ORAL
  Filled 2013-09-05: qty 2

## 2013-09-05 MED ORDER — ARTIFICIAL TEARS OP OINT
TOPICAL_OINTMENT | OPHTHALMIC | Status: DC | PRN
  Administered 2013-09-05: 1 via OPHTHALMIC

## 2013-09-05 MED ORDER — LACTATED RINGERS IV SOLN
INTRAVENOUS | Status: DC
  Administered 2013-09-05: 13:00:00 via INTRAVENOUS

## 2013-09-05 MED ORDER — LACTATED RINGERS IV SOLN
INTRAVENOUS | Status: DC | PRN
  Administered 2013-09-05 (×2): via INTRAVENOUS

## 2013-09-05 MED ORDER — MIDAZOLAM HCL 2 MG/2ML IJ SOLN
INTRAMUSCULAR | Status: AC
  Filled 2013-09-05: qty 2

## 2013-09-05 MED ORDER — PHENYLEPHRINE 40 MCG/ML (10ML) SYRINGE FOR IV PUSH (FOR BLOOD PRESSURE SUPPORT)
PREFILLED_SYRINGE | INTRAVENOUS | Status: AC
  Filled 2013-09-05: qty 10

## 2013-09-05 MED ORDER — DEXTROSE 5 % IV SOLN
1.5000 g | Freq: Two times a day (BID) | INTRAVENOUS | Status: AC
  Administered 2013-09-05 – 2013-09-06 (×2): 1.5 g via INTRAVENOUS
  Filled 2013-09-05 (×2): qty 1.5

## 2013-09-05 MED ORDER — CEFAZOLIN SODIUM-DEXTROSE 2-3 GM-% IV SOLR
2.0000 g | Freq: Once | INTRAVENOUS | Status: DC
  Filled 2013-09-05: qty 50

## 2013-09-05 MED ORDER — CEFAZOLIN SODIUM-DEXTROSE 2-3 GM-% IV SOLR
INTRAVENOUS | Status: AC
  Filled 2013-09-05: qty 50

## 2013-09-05 SURGICAL SUPPLY — 57 items
BANDAGE ELASTIC 6 VELCRO ST LF (GAUZE/BANDAGES/DRESSINGS) ×2 IMPLANT
BANDAGE ESMARK 6X9 LF (GAUZE/BANDAGES/DRESSINGS) IMPLANT
BANDAGE GAUZE ELAST BULKY 4 IN (GAUZE/BANDAGES/DRESSINGS) ×4 IMPLANT
BLADE SAW RECIP 87.9 MT (BLADE) ×2 IMPLANT
BNDG COHESIVE 6X5 TAN STRL LF (GAUZE/BANDAGES/DRESSINGS) ×2 IMPLANT
BNDG ESMARK 6X9 LF (GAUZE/BANDAGES/DRESSINGS)
BNDG GAUZE ELAST 4 BULKY (GAUZE/BANDAGES/DRESSINGS) ×2 IMPLANT
CANISTER SUCTION 2500CC (MISCELLANEOUS) ×2 IMPLANT
CLIP TI MEDIUM 6 (CLIP) IMPLANT
COVER SURGICAL LIGHT HANDLE (MISCELLANEOUS) ×2 IMPLANT
COVER TABLE BACK 60X90 (DRAPES) ×2 IMPLANT
CUFF TOURNIQUET SINGLE 18IN (TOURNIQUET CUFF) IMPLANT
CUFF TOURNIQUET SINGLE 24IN (TOURNIQUET CUFF) IMPLANT
CUFF TOURNIQUET SINGLE 34IN LL (TOURNIQUET CUFF) IMPLANT
CUFF TOURNIQUET SINGLE 44IN (TOURNIQUET CUFF) IMPLANT
DRAIN CHANNEL 19F RND (DRAIN) IMPLANT
DRAIN JACKSON PRT FLT 10 (DRAIN) ×2 IMPLANT
DRAPE ORTHO SPLIT 77X108 STRL (DRAPES) ×2
DRAPE PROXIMA HALF (DRAPES) ×4 IMPLANT
DRAPE SURG ORHT 6 SPLT 77X108 (DRAPES) ×2 IMPLANT
DRSG ADAPTIC 3X8 NADH LF (GAUZE/BANDAGES/DRESSINGS) ×2 IMPLANT
ELECT REM PT RETURN 9FT ADLT (ELECTROSURGICAL) ×2
ELECTRODE REM PT RTRN 9FT ADLT (ELECTROSURGICAL) ×1 IMPLANT
EVACUATOR SILICONE 100CC (DRAIN) ×2 IMPLANT
GLOVE BIO SURGEON STRL SZ7 (GLOVE) ×2 IMPLANT
GLOVE BIO SURGEON STRL SZ7.5 (GLOVE) ×6 IMPLANT
GLOVE BIOGEL PI IND STRL 7.5 (GLOVE) ×1 IMPLANT
GLOVE BIOGEL PI IND STRL 8 (GLOVE) ×2 IMPLANT
GLOVE BIOGEL PI INDICATOR 7.5 (GLOVE) ×1
GLOVE BIOGEL PI INDICATOR 8 (GLOVE) ×2
GOWN STRL REUS W/ TWL LRG LVL3 (GOWN DISPOSABLE) ×4 IMPLANT
GOWN STRL REUS W/TWL LRG LVL3 (GOWN DISPOSABLE) ×4
KIT BASIN OR (CUSTOM PROCEDURE TRAY) ×2 IMPLANT
KIT ROOM TURNOVER OR (KITS) ×2 IMPLANT
NS IRRIG 1000ML POUR BTL (IV SOLUTION) ×2 IMPLANT
PACK GENERAL/GYN (CUSTOM PROCEDURE TRAY) ×2 IMPLANT
PAD ABD 8X10 STRL (GAUZE/BANDAGES/DRESSINGS) ×2 IMPLANT
PAD ARMBOARD 7.5X6 YLW CONV (MISCELLANEOUS) ×4 IMPLANT
PADDING CAST COTTON 6X4 STRL (CAST SUPPLIES) IMPLANT
SPONGE GAUZE 4X4 12PLY (GAUZE/BANDAGES/DRESSINGS) ×4 IMPLANT
SPONGE GAUZE 4X4 12PLY STER LF (GAUZE/BANDAGES/DRESSINGS) ×2 IMPLANT
STAPLER VISISTAT 35W (STAPLE) ×4 IMPLANT
STOCKINETTE IMPERVIOUS LG (DRAPES) ×2 IMPLANT
SUT ETHILON 2 0 FS 18 (SUTURE) ×2 IMPLANT
SUT ETHILON 3 0 PS 1 (SUTURE) IMPLANT
SUT SILK 2 0 (SUTURE) ×1
SUT SILK 2 0 SH CR/8 (SUTURE) ×4 IMPLANT
SUT SILK 2-0 18XBRD TIE 12 (SUTURE) ×1 IMPLANT
SUT VIC AB 2-0 CT1 27 (SUTURE) ×2
SUT VIC AB 2-0 CT1 TAPERPNT 27 (SUTURE) ×2 IMPLANT
SUT VIC AB 2-0 SH 18 (SUTURE) ×6 IMPLANT
SUT VIC AB 3-0 SH 27 (SUTURE) ×3
SUT VIC AB 3-0 SH 27X BRD (SUTURE) ×3 IMPLANT
TOWEL OR 17X24 6PK STRL BLUE (TOWEL DISPOSABLE) ×2 IMPLANT
TOWEL OR 17X26 10 PK STRL BLUE (TOWEL DISPOSABLE) ×2 IMPLANT
UNDERPAD 30X30 INCONTINENT (UNDERPADS AND DIAPERS) ×2 IMPLANT
WATER STERILE IRR 1000ML POUR (IV SOLUTION) ×2 IMPLANT

## 2013-09-05 NOTE — Transfer of Care (Signed)
Immediate Anesthesia Transfer of Care Note  Patient: [name] LauthRobert Snowball  Procedure(s) Performed: Procedure(s): AMPUTATION BELOW KNEE (Left)  Patient Location: PACU  Anesthesia Type:General  Level of Consciousness: Arousable  Airway & Oxygen Therapy: Geraldine 4L O2, spontaneous respirations  Post-op Assessment: Maintaining airway  Post vital signs: Reviewed, stable  Complications: No apparent anesthesia complications

## 2013-09-05 NOTE — Preoperative (Signed)
Beta Blockers   Reason not to administer Beta Blockers:Not ApplicableAtenolol 100  Mg po given ~1100.

## 2013-09-05 NOTE — Anesthesia Preprocedure Evaluation (Addendum)
Anesthesia Evaluation  Patient identified by MRN, date of birth, ID band Patient confused    Reviewed: Allergy & Precautions, H&P , NPO status , Patient's Chart, lab work & pertinent test results, reviewed documented beta blocker date and time   History of Anesthesia Complications Negative for: history of anesthetic complications  Airway Mallampati: II TM Distance: >3 FB Neck ROM: Full    Dental  (+) Missing, Dental Advisory Given,    Pulmonary Current Smoker,          Cardiovascular hypertension, Pt. on medications and Pt. on home beta blockers + Peripheral Vascular Disease     Neuro/Psych Anxiety N/T R lower arm CVA, Residual Symptoms    GI/Hepatic Neg liver ROS, GERD-  Medicated and Controlled,  Endo/Other  diabetes, Well Controlled, Type 2, Insulin Dependent  Renal/GU      Musculoskeletal negative musculoskeletal ROS (+)   Abdominal   Peds  Hematology negative hematology ROS (+)   Anesthesia Other Findings   Reproductive/Obstetrics                          Anesthesia Physical Anesthesia Plan  ASA: III  Anesthesia Plan: General   Post-op Pain Management:    Induction: Intravenous  Airway Management Planned: Oral ETT  Additional Equipment:   Intra-op Plan:   Post-operative Plan: Extubation in OR  Informed Consent: I have reviewed the patients History and Physical, chart, labs and discussed the procedure including the risks, benefits and alternatives for the proposed anesthesia with the patient or authorized representative who has indicated his/her understanding and acceptance.   Dental advisory given  Plan Discussed with: CRNA and Anesthesiologist  Anesthesia Plan Comments:         Anesthesia Quick Evaluation

## 2013-09-05 NOTE — H&P (View-Only) (Signed)
Patient ID: [name] [name], male   DOB: 12/22/1965, 47 y.o.   MRN: [id number] Having difficulty with pain control in his left foot. Will increase his PCA to full dose morphine. Remains hemodynamically stable. Potassium low this morning we'll replace. For OR tomorrow 

## 2013-09-05 NOTE — Op Note (Addendum)
Procedure: Left below-knee amputation  Preoperative diagnosis: Ischemic left foot  Postoperative diagnosis: Same  Anesthesia Gen.  Assistant: Doreatha MassedSamantha Rhyne PA-C  Upper findings: #1 reasonably well perfused muscle tissue  #2 10 flat JP drain  Operative details: After obtaining informed consent, the patient was taken to the operating room. The patient was placed in supine position on the operating table. After induction of general anesthesia and endotracheal intubation, the patient's entire left lower extremity was prepped and draped all the way down to the level of the ankle. Next a transverse incision was made approximately 5 fingerbreadths below the tibial tuberosity on the right leg.  The  incision was carried posteriorly to the midportion of the leg and then extended longitudinally to create a posterior flap. The subcutaneous tissues and fascia was taken down with cautery. Periosteum was raised on the tibia approximately 5 cm above the skin edge.  The periosteum was also raised on the fibula several centimeters above this. The tibia was then divided with a saw. The fibula was divided with a bone cutter. The leg was then elevated in the operative field and the amputation was completed posterior to the bones with an amputation knife. Hemostasis was then obtained with cautery and several suture ligatures. The tibial and sural nerves were pulled down into the field transected and allowed to retract up into the leg.  After hemostasis was obtained, the wound was thoroughly irrigated with  normal saline solution. A 10 flat JP drain was placed in the muscle bed and brought out through a separate stab incision and sutured to the skin with a 2 0 nylon stitch.  The fascial edges were then reapproximated with interrupted 2-0 Vicryl sutures. Subcutaneous tissues were reapproximated using running 3-0 Vicryl suture. The skin was closed with staples. The patient tolerated the procedure well and there were no  complications. Instrument sponge and  needle counts were correct at the end of the case. The patient was taken to the recovery room in stable condition.  Fabienne Brunsharles Oakes Mccready, MD [facility] [place] Office: [phone] Pager: [phone]
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
## 2013-09-05 NOTE — Progress Notes (Signed)
Patient asleep most of the time. But easy to arouse. Continues on a full dose on PCA pump. Left leg pain continues. Had an episode of urinary incontinent. Nurse and NT gave patient a bath. Girl friend by the bed side. Will continue to monitor closely. Larey BrickEjindu, Ramesh Moan O

## 2013-09-05 NOTE — Interval H&P Note (Signed)
History and Physical Interval Note:  09/05/2013 2:00 PM  [name] LauthRobert [name]  has presented today for surgery, with the diagnosis of Left Foot Ischemia   The various methods of treatment have been discussed with the patient and family. After consideration of risks, benefits and other options for treatment, the patient has consented to  Procedure(s): AMPUTATION BELOW KNEE/ LEFT VS AMPUTATION ABOVE KNEE/ LEFT (Left) as a surgical intervention .  The patient's history has been reviewed, patient examined, no change in status, stable for surgery.  I have reviewed the patient's chart and labs.  Questions were answered to the patient's satisfaction.     FIELDS,CHARLES E

## 2013-09-05 NOTE — Anesthesia Postprocedure Evaluation (Signed)
  Anesthesia Post-op Note  Patient: [name] [name]  Procedure(s) Performed: Procedure(s): AMPUTATION BELOW KNEE (Left)  Patient Location: PACU  Anesthesia Type:General  Level of Consciousness: awake  Airway and Oxygen Therapy: Patient Spontanous Breathing  Post-op Pain: mild  Post-op Assessment: Post-op Vital signs reviewed  Post-op Vital Signs: Reviewed  Complications: No apparent anesthesia complications

## 2013-09-06 ENCOUNTER — Encounter (HOSPITAL_COMMUNITY): Payer: Self-pay | Admitting: Vascular Surgery

## 2013-09-06 DIAGNOSIS — I739 Peripheral vascular disease, unspecified: Secondary | ICD-10-CM

## 2013-09-06 DIAGNOSIS — L98499 Non-pressure chronic ulcer of skin of other sites with unspecified severity: Secondary | ICD-10-CM

## 2013-09-06 DIAGNOSIS — S88119A Complete traumatic amputation at level between knee and ankle, unspecified lower leg, initial encounter: Secondary | ICD-10-CM

## 2013-09-06 LAB — CBC
HCT: 28.8 % — ABNORMAL LOW (ref 39.0–52.0)
Hemoglobin: 9.5 g/dL — ABNORMAL LOW (ref 13.0–17.0)
MCH: 27.8 pg (ref 26.0–34.0)
MCHC: 33 g/dL (ref 30.0–36.0)
MCV: 84.2 fL (ref 78.0–100.0)
PLATELETS: 295 10*3/uL (ref 150–400)
RBC: 3.42 MIL/uL — ABNORMAL LOW (ref 4.22–5.81)
RDW: 13.8 % (ref 11.5–15.5)
WBC: 10.7 10*3/uL — ABNORMAL HIGH (ref 4.0–10.5)

## 2013-09-06 LAB — BASIC METABOLIC PANEL
BUN: 8 mg/dL (ref 6–23)
CALCIUM: 9.2 mg/dL (ref 8.4–10.5)
CO2: 32 mEq/L (ref 19–32)
CREATININE: 0.87 mg/dL (ref 0.50–1.35)
Chloride: 92 mEq/L — ABNORMAL LOW (ref 96–112)
Glucose, Bld: 106 mg/dL — ABNORMAL HIGH (ref 70–99)
Potassium: 3.3 mEq/L — ABNORMAL LOW (ref 3.7–5.3)
Sodium: 138 mEq/L (ref 137–147)

## 2013-09-06 LAB — GLUCOSE, CAPILLARY
Glucose-Capillary: 141 mg/dL — ABNORMAL HIGH (ref 70–99)
Glucose-Capillary: 131 mg/dL — ABNORMAL HIGH (ref 70–99)
Glucose-Capillary: 164 mg/dL — ABNORMAL HIGH (ref 70–99)
Glucose-Capillary: 170 mg/dL — ABNORMAL HIGH (ref 70–99)

## 2013-09-06 MED ORDER — MORPHINE SULFATE (PF) 1 MG/ML IV SOLN
INTRAVENOUS | Status: DC
  Administered 2013-09-05: 25 mg via INTRAVENOUS
  Administered 2013-09-06: 12:00:00 via INTRAVENOUS
  Administered 2013-09-06: 25 mg via INTRAVENOUS
  Administered 2013-09-06: 12 mg via INTRAVENOUS
  Filled 2013-09-06 (×2): qty 25

## 2013-09-06 MED ORDER — MORPHINE SULFATE 2 MG/ML IJ SOLN
1.0000 mg | INTRAMUSCULAR | Status: DC | PRN
  Filled 2013-09-06: qty 1

## 2013-09-06 NOTE — Progress Notes (Signed)
Physical Therapy Treatment Patient Details Name: [name] LauthRobert [name] MRN: [id number] DOB: [birth year] Today's Date: 09/06/2013 Time: [id number] PT Time Calculation (min): 36 min  PT Assessment / Plan / Recommendation  History of Present Illness 48 yo, s/p left CEA and ABF by dr Darrick PennaFields in 2013.  Patient developed left leg pain and coldness which was severe and not relieved on Saturday.  Pt is s/p THROMBECTOMY AORTA BIFEM, POSSIABLE BYPASS GRAFT AXILLA-BIFEMORAL .  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   PT Comments   Pt needed a lot of redirection to stay on task.  Stood and ambulated with RW and min steady assist, but with maximal cuing.  Pt could benefit from all 3 rehab disciplines on CIR  Follow Up Recommendations  CIR     Does the patient have the potential to tolerate intense rehabilitation     Barriers to Discharge        Equipment Recommendations  Rolling walker with 5" wheels;3in1 (PT);Wheelchair (measurements PT);Wheelchair cushion (measurements PT);Other (comment)    Recommendations for Other Services    Frequency Min 3X/week   Progress towards PT Goals Progress towards PT goals: Progressing toward goals  Plan Current plan remains appropriate    Precautions / Restrictions Precautions Precautions: Fall Precaution Comments: Pt very confused, poor attn span, makes him a large fall risk. Restrictions Weight Bearing Restrictions: No Other Position/Activity Restrictions: Pt new L BKA   Pertinent Vitals/Pain     Mobility  Bed Mobility Overal bed mobility: Needs Assistance Bed Mobility: Supine to Sit Supine to sit: Min guard General bed mobility comments: increased time due to pain and slow processing Transfers Overall transfer level: Needs assistance Equipment used: Rolling walker (2 wheeled) Sit to Stand: Min assist Stand pivot transfers: Min assist General transfer comment: cues for hand placement and sequencing; unable to fully WB through Lt LE due to pain; incr time required  due to pain  Ambulation/Gait Ambulation/Gait assistance: Min assist Ambulation Distance (Feet): 15 Feet Assistive device: Rolling walker (2 wheeled) Gait Pattern/deviations: Step-to pattern Gait velocity interpretation: Below normal speed for age/gender General Gait Details: Good upright posture, successful swing to gait.  Needs occaisional cues to not swing too far.  Needed steady assist    Exercises General Exercises - Lower Extremity Hip ABduction/ADduction: AAROM;Left;5 reps Straight Leg Raises: AAROM;Left;5 reps;Seated Other Exercises Other Exercises: gluteal sets/hip extension isometrics x5 in sitting   PT Diagnosis:    PT Problem List:   PT Treatment Interventions:     PT Goals (current goals can now be found in the care plan section) Acute Rehab PT Goals Patient Stated Goal: to get better PT Goal Formulation: With patient Time For Goal Achievement: 09/15/13 Potential to Achieve Goals: Good  Visit Information  Last PT Received On: 09/06/13 Assistance Needed: +2 (only for safety reasons.  pt confused at times) PT/OT/SLP Co-Evaluation/Treatment: Yes Reason for Co-Treatment: Complexity of the patient's impairments (multi-system involvement);Necessary to address cognition/behavior during functional activity PT goals addressed during session: Mobility/safety with mobility History of Present Illness: 48 yo, s/p left CEA and ABF by dr Darrick PennaFields in 2013.  Patient developed left leg pain and coldness which was severe and not relieved on Saturday.  Pt is s/p THROMBECTOMY AORTA BIFEM, POSSIABLE BYPASS GRAFT AXILLA-BIFEMORAL .  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    Subjective Data  Subjective: Why are yall trying to mess with me. Patient Stated Goal: to get better   Cognition  Cognition Arousal/Alertness: Awake/alert Behavior During Therapy: Anxious;Impulsive;Agitated Overall Cognitive Status:  Impaired/Different from baseline Area of Impairment: Orientation;Attention;Memory;Following  commands;Safety/judgement;Awareness;Problem solving Orientation Level: Disoriented to;Time Current Attention Level: Sustained Memory: Decreased short-term memory;Decreased recall of precautions Following Commands: Follows one step commands consistently Safety/Judgement: Decreased awareness of deficits;Decreased awareness of safety Awareness: Emergent Problem Solving: Slow processing;Difficulty sequencing;Requires verbal cues;Requires tactile cues General Comments: Pt very erratic with all cognition.  Pt would tend to task for a few minutes and then almost zone out and not hear what theapist was saying.  Pt stated he did not need therapy and almost seemed to forget why he was here.  Pt very labile during session and impusive with mobilty.  Unsure how much of this is from his stroke in 2013.  Pt pocketed food from lunch and appeared to have a significant expressive aphasia. Pt may benefit from ST consult and psych consult bc girlfriend states pt has a new Bipolar diagnosis but is not on meds.    Balance  Balance Overall balance assessment: Needs assistance Sitting-balance support: Single extremity supported;Bilateral upper extremity supported Sitting balance-Leahy Scale: Good Standing balance support: Bilateral upper extremity supported Standing balance comment:   pt must have walker due to amputation   End of Session PT - End of Session Activity Tolerance: Patient tolerated treatment well Patient left: in bed;with call bell/phone within reach;with family/visitor present Nurse Communication: Mobility status   GP     [name] [name], Eliseo Gum 09/06/2013, 5:00 PM 09/06/2013  [place] Bing, PT [phone]  (pager)

## 2013-09-06 NOTE — Progress Notes (Signed)
Occupational Therapy Treatment and Re evaluation Patient Details Name: [name] [name] MRN: [id number] DOB: [birth year] Today's Date: 09/06/2013 Time: 4098-1191 OT Time Calculation (min): 36 min  OT Assessment / Plan / Recommendation  History of present illness 48 yo, s/p left CEA and ABF by dr Darrick Penna in 2013.  Patient developed left leg pain and coldness which was severe and not relieved on Saturday.  Pt is s/p THROMBECTOMY AORTA BIFEM, POSSIABLE BYPASS GRAFT AXILLA-BIFEMORAL .  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  Pt has new deficits listed below and would benefit from cont OT to increase I with basic adl status back to S to mod I so pt can eventually d/c home with his girlfriend to his apartment.  Pt's girlfriend stated pt has had a "new" Bipolar d/o diagnosis in Sept of last year but is not on medications for this.  Feel pt may benefit from psych consult and ST consult for cognition and swallowing due to the pocketing that was noticed as pt was eating.  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  Follow Up Recommendations  CIR;Supervision/Assistance - 24 hour    Barriers to Discharge       Equipment Recommendations  3 in 1 bedside comode;Tub/shower bench    Recommendations for Other Services Rehab consult  Frequency Min 2X/week   Progress towards OT Goals Progress towards OT goals: Goals drowngraded-see care plan  Plan Discharge plan needs to be updated    Precautions / Restrictions Precautions Precautions: Fall Precaution Comments: Pt very confused, poor attn span, makes him a large fall risk. Restrictions Weight Bearing Restrictions: No Other Position/Activity Restrictions: Pt new L BKA   Pertinent Vitals/Pain Pt appears to have significant pain at times in new amputation area but unable to rate.  On faces scale 5/10.      ADL  Eating/Feeding: Performed;Supervision/safety;Other (comment) (pt with significant pocketing in L side of  mouth) Where Assessed - Eating/Feeding: Chair Grooming: Simulated;Wash/dry face;Wash/dry hands;Set up Where Assessed - Grooming: Unsupported sitting Upper Body Bathing: Simulated;Supervision/safety;Other (comment) (cues for initiation and follow through) Where Assessed - Upper Body Bathing: Unsupported sitting Lower Body Bathing: Simulated;Moderate assistance Where Assessed - Lower Body Bathing: Supported sit to stand Upper Body Dressing: Performed;Set up Where Assessed - Upper Body Dressing: Unsupported sitting Lower Body Dressing: Performed;Moderate assistance Where Assessed - Lower Body Dressing: Supported sit to Pharmacist, hospital: Performed;Minimal Dentist Method: Sit to Barista: Materials engineer and Hygiene: Performed;Minimal assistance Where Assessed - Engineer, mining and Hygiene: Standing Equipment Used: Rolling walker Transfers/Ambulation Related to ADLs: required min assist for ambulation and transfers with new BKA ADL Comments: pt requires assist to get to RLE and assist with all adls in standing due to decreased balance.  Pt with very poor attn span and problem solving which makes pt a fall risk.    OT Diagnosis:    OT Problem List:   OT Treatment Interventions:     OT Goals(current goals can now be found in the care plan section) Acute Rehab OT Goals Patient Stated Goal: to get better OT Goal Formulation: With patient Time For Goal Achievement: 09/20/13 Potential to Achieve Goals: Good ADL Goals Pt Will Perform Grooming: with supervision;standing Pt Will Perform Lower Body Bathing: with supervision Pt Will Transfer to Toilet: with supervision Pt Will Perform Toileting - Clothing Manipulation and hygiene: with min guard assist;sit to/from stand  Pt Will Perform Tub/Shower Transfer: Tub transfer;ambulating;tub bench;with min assist;rolling walker  Visit Information  Last OT  Received On: 09/06/13 Assistance Needed: +2 (only for safety reasons.  pt confused at times) History of Present Illness: 48 yo, s/p left CEA and ABF by dr Darrick PennaFields in 2013.  Patient developed left leg pain and coldness which was severe and not relieved on Saturday.  Pt is s/p THROMBECTOMY AORTA BIFEM, POSSIABLE BYPASS GRAFT AXILLA-BIFEMORAL .  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    Subjective Data      Prior Functioning       Cognition  Cognition Arousal/Alertness: Awake/alert Behavior During Therapy: Anxious;Impulsive;Agitated Overall Cognitive Status: Impaired/Different from baseline Area of Impairment: Orientation;Attention;Memory;Following commands;Safety/judgement;Awareness;Problem solving Orientation Level: Disoriented to;Time Current Attention Level: Sustained Memory: Decreased short-term memory;Decreased recall of precautions Following Commands: Follows one step commands consistently Safety/Judgement: Decreased awareness of deficits;Decreased awareness of safety Awareness: Emergent Problem Solving: Slow processing;Difficulty sequencing;Requires verbal cues;Requires tactile cues General Comments: Pt very erratic with all cognition.  Pt would tend to task for a few minutes and then almost zone out and not hear what theapist was saying.  Pt stated he did not need therapy and almost seemed to forget why he was here.  Pt very labile during session and impusive with mobilty.  Unsure how much of this is from his stroke in 2013.  Pt pocketed food from lunch and appeared to have a significant expressive aphasia. Pt may benefit from ST consult and psych consult bc girlfriend states pt has a new Bipolar diagnosis but is not on meds.    Mobility  Bed Mobility Overal bed mobility: Needs Assistance Bed Mobility: Supine to Sit Supine to sit: Min guard General bed mobility comments: increased time due to pain and slow processing Transfers Overall transfer level: Needs assistance Equipment used:  Rolling walker (2 wheeled) Transfers: Sit to/from UGI CorporationStand;Stand Pivot Transfers Sit to Stand: Min assist Stand pivot transfers: Min assist General transfer comment: cues for hand placement and sequencing; unable to fully WB through Lt LE due to pain; incr time required due to pain     Exercises      Balance Balance Overall balance assessment: Needs assistance Sitting-balance support: Bilateral upper extremity supported;Feet supported Sitting balance-Leahy Scale: Good Standing balance support: Bilateral upper extremity supported;During functional activity Standing balance-Leahy Scale: Poor Standing balance comment: pt must have walker due to amputation  End of Session OT - End of Session Equipment Utilized During Treatment: Rolling walker Activity Tolerance: Patient limited by pain Patient left: in chair;with call bell/phone within reach;with chair alarm set;with family/visitor present Nurse Communication: Mobility status;Other (comment) (pts cognitive deficits)  GO     Hope BuddsJones, Kale Dols Anne 09/06/2013, 1:13 PM [phone]

## 2013-09-06 NOTE — Progress Notes (Addendum)
[facility]  Subjective  - Closes eye while you try to talk with him.   Objective 121/80 77 99.6 F (37.6 C) (Oral) 18 100%  Intake/Output Summary (Last 24 hours) at 09/06/13 1350 Last data filed at 09/06/13 1230  Gross per 24 hour  Intake   1510 ml  Output   2475 ml  Net   -965 ml    Left BKA dressing C/D, drain in place. Plan change dressing in am and D/C drain.   Assessment/Planning: POD #1  Left BKA Wean off PCA and give PO pain medication. Pending CIR consult for in house rehab Will order biotech tomorrow  [name], EMMA [facility] 09/06/2013 1:50 PM -- Karma GreaserWean off pain meds Right foot warm BKA dressing dry D/c drain tomorrow Rehab pending  Fabienne Brunsharles Miyo Aina, MD [facility] [place] Office: [phone] Pager: [phone]  Laboratory Lab Results:  Recent Labs  09/05/13 0643 09/06/13 0415  WBC 8.8 10.7*  HGB 9.7* 9.5*  HCT 28.3* 28.8*  PLT 247 295   BMET  Recent Labs  09/05/13 0643 09/06/13 0415  NA 138 138  K 3.0* 3.3*  CL 92* 92*  CO2 32 32  GLUCOSE 135* 106*  BUN 7 8  CREATININE 0.87 0.87  CALCIUM 9.3 9.2    COAG Lab Results  Component Value Date   INR 1.14 09/05/2013   INR 0.99 08/30/2013   INR 1.36 01/12/2012   No results found for this basename: PTT

## 2013-09-07 ENCOUNTER — Telehealth: Payer: Self-pay | Admitting: Vascular Surgery

## 2013-09-07 LAB — BASIC METABOLIC PANEL
BUN: 8 mg/dL (ref 6–23)
CO2: 31 mEq/L (ref 19–32)
CREATININE: 0.9 mg/dL (ref 0.50–1.35)
Calcium: 8.9 mg/dL (ref 8.4–10.5)
Chloride: 89 mEq/L — ABNORMAL LOW (ref 96–112)
GFR calc Af Amer: >90 mL/min (ref >90)
GFR calc non Af Amer: >90 mL/min (ref >90)
Glucose, Bld: 147 mg/dL — ABNORMAL HIGH (ref 70–99)
POTASSIUM: 3.3 meq/L — AB (ref 3.7–5.3)
Sodium: 134 mEq/L — ABNORMAL LOW (ref 137–147)

## 2013-09-07 LAB — CBC
HCT: 26.8 % — ABNORMAL LOW (ref 39.0–52.0)
Hemoglobin: 8.9 g/dL — ABNORMAL LOW (ref 13.0–17.0)
MCH: 27.8 pg (ref 26.0–34.0)
MCHC: 33.2 g/dL (ref 30.0–36.0)
MCV: 83.8 fL (ref 78.0–100.0)
Platelets: 324 10*3/uL (ref 150–400)
RBC: 3.2 MIL/uL — ABNORMAL LOW (ref 4.22–5.81)
RDW: 13.9 % (ref 11.5–15.5)
WBC: 11.2 10*3/uL — AB (ref 4.0–10.5)

## 2013-09-07 LAB — GLUCOSE, CAPILLARY
GLUCOSE-CAPILLARY: 142 mg/dL — AB (ref 70–99)
Glucose-Capillary: 222 mg/dL — ABNORMAL HIGH (ref 70–99)
Glucose-Capillary: 217 mg/dL — ABNORMAL HIGH (ref 70–99)
Glucose-Capillary: 160 mg/dL — ABNORMAL HIGH (ref 70–99)

## 2013-09-07 MED ORDER — GABAPENTIN 400 MG PO CAPS
400.0000 mg | ORAL_CAPSULE | Freq: Three times a day (TID) | ORAL | Status: DC
  Administered 2013-09-07 – 2013-09-09 (×6): 400 mg via ORAL
  Filled 2013-09-07 (×8): qty 1

## 2013-09-07 NOTE — Telephone Encounter (Addendum)
Message copied by Fredrich BirksMILLIKAN, DANA P on Wed Sep 07, 2013  3:23 PM ------      Message from: Lorin MercyMCCHESNEY, MARILYN K      Created: Mon Sep 05, 2013  4:49 PM      Regarding: Schedule                   ----- Message -----         From: Dara LordsSamantha J Rhyne, PA-C         Sent: 09/05/2013   4:10 PM           To: [facility] Charge Pool            S/p left BKA 09/05/13.  F/u with Dr. Darrick PennaFields in  Weeks (his groins will need to be checked in 2 weeks).            Thanks,      Samantha ------  09/07/13: no voicemail, mailed letter, dpm

## 2013-09-07 NOTE — Progress Notes (Addendum)
[facility]  Subjective  - Doing better today.   Objective 124/75 90 98.8 F (37.1 C) (Oral) 18 94%  Intake/Output Summary (Last 24 hours) at 09/07/13 0844 Last data filed at 09/07/13 0600  Gross per 24 hour  Intake    480 ml  Output    690 ml  Net   -210 ml    Left BKA incision C/D/I Drain D/C'd Clean dry dressing applied.  Assessment/Planning: POD #2 S/P BKA D/C'd PCA on PO meds Biotech order placed Pending CIR   [name], EMMA [facility] 09/07/2013 8:44 AM --  Laboratory Lab Results:  Recent Labs  09/06/13 0415 09/07/13 0500  WBC 10.7* 11.2*  HGB 9.5* 8.9*  HCT 28.8* 26.8*  PLT 295 324   BMET  Recent Labs  09/06/13 0415 09/07/13 0500  NA 138 134*  K 3.3* 3.3*  CL 92* 89*  CO2 32 31  GLUCOSE 106* 147*  BUN 8 8  CREATININE 0.87 0.90  CALCIUM 9.2 8.9    COAG Lab Results  Component Value Date   INR 1.14 09/05/2013   INR 0.99 08/30/2013   INR 1.36 01/12/2012   No results found for this basename: PTT   Addendum  I have independently interviewed and examined the patient, and I agree with the physician assistant's findings.  Both groins are c/d/i, L BKA incision are c/d/i, drain d/c'ed.  Pt would be a good CIR candidate as he has good return to ambulation potential with a prosthesis.  Leonides SakeBrian Chen, MD [facility] [place] Office: [phone] Pager: [phone]  09/07/2013, 10:28 AM

## 2013-09-07 NOTE — Progress Notes (Signed)
[facility]  Subjective  - He is still having pain issues.  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   I Chantille Navarrete MAUREEN PA-C

## 2013-09-08 LAB — GLUCOSE, CAPILLARY
GLUCOSE-CAPILLARY: 162 mg/dL — AB (ref 70–99)
GLUCOSE-CAPILLARY: 159 mg/dL — AB (ref 70–99)
Glucose-Capillary: 214 mg/dL — ABNORMAL HIGH (ref 70–99)
Glucose-Capillary: 181 mg/dL — ABNORMAL HIGH (ref 70–99)

## 2013-09-08 MED ORDER — CEPHALEXIN 500 MG PO CAPS
500.0000 mg | ORAL_CAPSULE | Freq: Four times a day (QID) | ORAL | Status: DC
  Administered 2013-09-08 – 2013-09-09 (×5): 500 mg via ORAL
  Filled 2013-09-08 (×8): qty 1

## 2013-09-08 MED ORDER — NICOTINE 14 MG/24HR TD PT24
14.0000 mg | MEDICATED_PATCH | Freq: Every day | TRANSDERMAL | Status: DC
  Administered 2013-09-08 – 2013-09-09 (×2): 14 mg via TRANSDERMAL
  Filled 2013-09-08 (×2): qty 1

## 2013-09-08 NOTE — Progress Notes (Signed)
Maureen PA changed dressing Archie BalboaStein, Demorio Seeley G, RN

## 2013-09-08 NOTE — Progress Notes (Signed)
Rehab admissions - I met with pt and his wife and explained the purposes of inpatient rehab. Informational brochures given and questions answered. Pt had some confusion during this discussion and wife expressed strong interest in pt going to rehab. Wife also expressed desire for further family teaching re: L BKA management. Wife was hesitant to return home with only home health services.  We will monitor his overall progress today with therapies and believe he may benefit from further acute rehab. We will likely have beds available tomorrow and will check on his status then.  I will keep the pt/family and medical team updated on the plan.   Please call me with any questions.   Thanks.  Nanetta Batty, PT Rehabilitation Admissions Coordinator [phone]

## 2013-09-08 NOTE — Progress Notes (Signed)
   Daily Progress Note  Assessment/Planning: POD #3 s/p L BKA, POD #8 s/p L iliac TE, L fem EA with patch angio, R iliac TE, L to R fem-fem BPG, L pop and tib. TE, vein patch L Pop, 4 compartment fasciotomy   Dry dressing to both groins  Abx as a precaution for fem-fem and aortobifem grafts, given breakdown in skin barrier  CIR admission pending insurance approval and bed availability  Subjective  - 3 Days Post-Op  R groin clear drainage  Objective Filed Vitals:   09/06/13 2142 09/07/13 0708 09/07/13 2150 09/08/13 0624  BP: 142/82 124/75 113/76 131/82  Pulse: 103 90 91 92  Temp: 99.4 F (37.4 C) 98.8 F (37.1 C) 98.3 F (36.8 C) 98.4 F (36.9 C)  TempSrc: Oral Oral Oral Oral  Resp: 21 18 20 19   Height:      Weight:    219 lb 2.2 oz (99.4 kg)  SpO2: 100% 94% 95% 94%    Intake/Output Summary (Last 24 hours) at 09/08/13 0957 Last data filed at 09/08/13 0803  Gross per 24 hour  Intake    880 ml  Output   1383 ml  Net   -503 ml    PULM  CTAB CV  RRR GI  soft, NTND VASC  Minor L superficial skin separation, R groin incision with intact incision but some serous drainage, no evidence of cellulitis in either groin, L BKA bandaged  Laboratory CBC    Component Value Date/Time   WBC 11.2* 09/07/2013 0500   HGB 8.9* 09/07/2013 0500   HCT 26.8* 09/07/2013 0500   PLT 324 09/07/2013 0500    BMET    Component Value Date/Time   NA 134* 09/07/2013 0500   K 3.3* 09/07/2013 0500   CL 89* 09/07/2013 0500   CO2 31 09/07/2013 0500   GLUCOSE 147* 09/07/2013 0500   BUN 8 09/07/2013 0500   CREATININE 0.90 09/07/2013 0500   CALCIUM 8.9 09/07/2013 0500   GFRNONAA >90 09/07/2013 0500   GFRAA >90 09/07/2013 0500    Leonides SakeBrian Chen, MD [facility] [place] Office: [phone] Pager: [phone]  09/08/2013, 9:57 AM

## 2013-09-08 NOTE — PMR Pre-admission (Signed)
PMR Admission Coordinator Pre-Admission Assessment  Patient: [name] [name] is an 48 y.o., male MRN: [id number] DOB: 05/07/1966 Height: 6\' 2"  (188 cm) Weight: 99.4 kg (219 lb 2.2 oz)              Insurance Information HMO:     PPO:      PCP:      IPA:      80/20:      OTHER:  PRIMARY: Medicaid [place]       Policy#: [id number]      Subscriber: self CM Name:       Phone#:      Fax#:  Pre-Cert#: verified eligibility as of 09-08-13      Employer: not employed Benefits:  Phone #: [phone]     Name:  Eff. Date:      Deduct:       Out of Pocket Max:       Life Max:  CIR: covered      SNF: covered Outpatient:  covered    Co-Pay:  Home Health: covered       Co-Pay:  DME:  covered    Co-Pay:  Providers: pt's preference  Emergency Contact Information Contact Information   Name Relation Home Work Mobile   [place]arcel[place] Penuel[place]Kindley,Lynette Significant other [phone]  [phone]   Vinnie LangtonJones,Helen Mother [phone]       Current Medical History  Patient Admitting Diagnosis: Pt is s/p THROMBECTOMY AORTA BIFEM, POSSIABLE BYPASS GRAFT AXILLA-BIFEMORAL on 08-31-13 .  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  History of Present Illness: [name] [name] is a 48 y.o. male with h/o HTN, DM, tobacco abuse, ASPVD s/p L-CEA and aortobifemoral BG; who was admitted on 08/30/13 with cold, pulseless, painful left foot. CTA with occluded ABF and patient underwent bilateral iliac thrombectomy, left femoral endarterectomy with left to right femoral-femoral bypass, left popliteal and tibial embolectomy and LLE four compartment fasciotomy by Dr. Darrick PennaFields on 08/31/13. Post op with ABLA requiring transfusion, continued pain as well as confusion (due to PCA?).  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 Darrick PennaFields. Pt is participating well with PT/OT and has some safety/cognitive issues as well. Recommendations were made for acute inpatient rehab.   Past Medical History  Past Medical History  Diagnosis Date  . GERD (gastroesophageal reflux  disease)   . Nicotine addiction   . ETOH abuse   . Anxiety   . Hypertension     no pcp     saw peter nisham once  . Stroke 08/11/11    slurred speech  . Peripheral vascular disease   . Diabetes mellitus without complication   . Hyperlipidemia     Family History  family history includes Hypertension in his mother.  Prior Rehab/Hospitalizations: had a CVA in 2013 and a few follow up home health visits. Pt mainly had speech needs and had R UE sensory changes.     Current Medications  Current facility-administered medications:0.9 %  sodium chloride infusion, , Intravenous, Continuous, Samantha J Rhyne, PA-C, Last Rate: 100 mL/hr at 09/05/13 2000;  acetaminophen (TYLENOL) suppository 325-650 mg, 325-650 mg, Rectal, Q4H PRN, Samantha J Rhyne, PA-C;  acetaminophen (TYLENOL) tablet 325-650 mg, 325-650 mg, Oral, Q4H PRN, Ames CoupeSamantha J Rhyne, PA-C, 650 mg at 09/06/13 0755 ALPRAZolam (XANAX) tablet 1 mg, 1 mg, Oral, Daily, Samantha J Rhyne, PA-C, 1 mg at 09/09/13 [id number];  ALPRAZolam Prudy Feeler(XANAX) tablet 1 mg, 1 mg, Oral, BID PRN, Sherren Kernsharles E Fields, MD, 1 mg at 09/06/13 1816;  alum & mag  hydroxide-simeth (MAALOX/MYLANTA) 200-200-20 MG/5ML suspension 15-30 mL, 15-30 mL, Oral, Q2H PRN, Samantha J Rhyne, PA-C;  amphetamine-dextroamphetamine (ADDERALL) tablet 10 mg, 10 mg, Oral, Daily, Samantha J Rhyne, PA-C, 10 mg at 09/09/13 0840 aspirin EC tablet 325 mg, 325 mg, Oral, Daily, Samantha J Rhyne, PA-C, 325 mg at 09/09/13 1000;  atenolol (TENORMIN) tablet 100 mg, 100 mg, Oral, Daily, Samantha J Rhyne, PA-C, 100 mg at 09/08/13 1043;  ceFAZolin (ANCEF) IVPB 2 g/50 mL premix, 2 g, Intravenous, Once, [name], MD;  cephALEXin [facility]) capsule 500 mg, 500 mg, Oral, 4 times per day, Fransisco Hertz, MD, 500 mg at 09/09/13 1610 docusate sodium (COLACE) capsule 100 mg, 100 mg, Oral, Daily, Samantha J Rhyne, PA-C, 100 mg at 09/09/13 0841;  enoxaparin (LOVENOX) injection 40 mg, 40 mg, Subcutaneous, Q24H, Samantha J Rhyne, PA-C,  40 mg at 09/08/13 1642;  gabapentin (NEURONTIN) capsule 400 mg, 400 mg, Oral, TID, Lars Mage, PA-C, 400 mg at 09/09/13 9604;  glipiZIDE (GLUCOTROL) tablet 5 mg, 5 mg, Oral, QAC breakfast, Samantha J Rhyne, PA-C, 5 mg at 09/09/13 0840 guaiFENesin-dextromethorphan (ROBITUSSIN DM) 100-10 MG/5ML syrup 15 mL, 15 mL, Oral, Q4H PRN, Samantha J Rhyne, PA-C;  hydrALAZINE (APRESOLINE) injection 10 mg, 10 mg, Intravenous, Q2H PRN, Samantha J Rhyne, PA-C, 10 mg at 08/31/13 1836;  hydrochlorothiazide (HYDRODIURIL) tablet 50 mg, 50 mg, Oral, Daily, Samantha J Rhyne, PA-C, 50 mg at 09/08/13 1043 insulin aspart (novoLOG) injection 0-20 Units, 0-20 Units, Subcutaneous, TID WC, [name], MD, 4 Units at 09/09/13 [id number];  insulin glargine (LANTUS) injection 40 Units, 40 Units, Subcutaneous, QHS, Samantha J Rhyne, PA-C, 40 Units at 09/08/13 2125;  labetalol (NORMODYNE,TRANDATE) injection 10 mg, 10 mg, Intravenous, Q2H PRN, Samantha J Rhyne, PA-C lisinopril (PRINIVIL,ZESTRIL) tablet 20 mg, 20 mg, Oral, BID, Samantha J Rhyne, PA-C, 20 mg at 09/08/13 2124;  metoprolol (LOPRESSOR) injection 2-5 mg, 2-5 mg, Intravenous, Q2H PRN, Samantha J Rhyne, PA-C;  morphine 2 MG/ML injection 1-2 mg, 1-2 mg, Intravenous, Q2H PRN, Lars Mage, PA-C;  nicotine (NICODERM CQ - dosed in mg/24 hours) patch 14 mg, 14 mg, Transdermal, Daily, Emma M Collins, PA-C, 14 mg at 09/08/13 1351 ondansetron (ZOFRAN) injection 4 mg, 4 mg, Intravenous, Q6H PRN, Samantha J Rhyne, PA-C, 4 mg at 09/01/13 0120;  oxyCODONE (Oxy IR/ROXICODONE) immediate release tablet 5-10 mg, 5-10 mg, Oral, Q4H PRN, Samantha J Rhyne, PA-C, 10 mg at 09/09/13 0840;  pantoprazole (PROTONIX) EC tablet 40 mg, 40 mg, Oral, Daily, Samantha J Rhyne, PA-C, 40 mg at 09/09/13 0840;  phenol (CHLORASEPTIC) mouth spray 1 spray, 1 spray, Mouth/Throat, PRN, Samantha J Rhyne, PA-C potassium chloride (K-DUR) CR tablet 20 mEq, 20 mEq, Oral, BID, Emma M Collins, PA-C, 20 mEq at 09/09/13 1000;   white petrolatum (VASELINE) gel, , Topical, PRN, [name], MD, 0.2 application at 09/03/13 1005  Patients Current Diet: Carb Control  Precautions / Restrictions Precautions Precautions: Fall Precaution Comments: Pt very confused, poor attn span, makes him a large fall risk. Restrictions Weight Bearing Restrictions: No Other Position/Activity Restrictions: Pt new L BKA   Prior Activity Level Household: pt mainly stays at home. His girlfriend runs all the errands.and pt does not have a driver's license. 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  Home Assistive Devices / Equipment Home Assistive Devices/Equipment: CBG Meter Home Equipment: None  Prior Functional Level Prior Function Level of Independence: Independent  Current Functional Level Cognition  Overall Cognitive Status: Impaired/Different from baseline Current Attention Level:  Sustained Orientation Level: Oriented to person;Oriented to situation Following Commands: Follows one step commands consistently Safety/Judgement: Decreased awareness of deficits;Decreased awareness of safety General Comments: Pt aware that he has been confused and that some confusion persists. Labile at times.    Extremity Assessment (includes Sensation/Coordination)          ADLs  Eating/Feeding: Performed;Supervision/safety;Other (comment) (pt with significant pocketing in L side of mouth)  Where Assessed - Eating/Feeding: Chair  Grooming: Simulated;Wash/dry face;Wash/dry hands;Set up  Where Assessed - Grooming: Unsupported sitting  Upper Body Bathing: Simulated;Supervision/safety;Other (comment) (cues for initiation and follow through)  Where Assessed - Upper Body Bathing: Unsupported sitting  Lower Body Bathing: Simulated;Moderate assistance  Where Assessed - Lower Body Bathing: Supported sit to stand  Upper Body Dressing: Performed;Set up  Where Assessed - Upper Body Dressing: Unsupported sitting  Lower Body Dressing:  Performed;Moderate assistance  Where Assessed - Lower Body Dressing: Supported sit to Scientist, research (life sciences): Performed;Minimal Psychologist, clinical Method: Sit to Production manager: Microbiologist and Hygiene: Performed;Minimal assistance  Where Assessed - Engineer, mining and Hygiene: Standing  Equipment Used: Rolling walker  Transfers/Ambulation Related to ADLs: required min assist for ambulation and transfers with new BKA  ADL Comments: pt requires assist to get to RLE and assist with all adls in standing due to decreased balance. Pt with very poor attn span and problem solving which makes pt a fall risk.     Mobility  Overal bed mobility: Needs Assistance Bed Mobility: Supine to Sit;Sit to Supine Supine to sit: Min guard Sit to supine: Supervision General bed mobility comments: increased time due to pain and slow processing    Transfers  Overall transfer level: Needs assistance Equipment used: Rolling walker (2 wheeled) Transfers: Sit to/from [employer] Sit to Stand: Min assist Stand pivot transfers: Min assist General transfer comment: cues for hand placement and sequencing; Pt using LUE on center of RW and RUE push off surface    Ambulation / Gait / Stairs / Wheelchair Mobility  Ambulation/Gait Ambulation/Gait assistance: Min assist;+2 safety/equipment Ambulation Distance (Feet): 60 Feet Assistive device: Rolling walker (2 wheeled) Gait Pattern/deviations: Step-to pattern Gait velocity: very decreased due to pain  Gait velocity interpretation: Below normal speed for age/gender General Gait Details: Good upright posture, successful swing to gait.  Needs occaisional cues to not swing too far.  Needed steady assist    Posture / Balance      Special needs/care consideration BiPAP/CPAP no CPM no Continuous Drip IV no  Dialysis no        Life Vest no Oxygen no  Special Bed no   Trach Size no  Wound Vac (area) no       Skin - incision with L BKA and groin incision from aorta bifem. site                              Bowel mgmt: last BM on 09-07-13, ambulating with assist to commode Bladder mgmt: currently using urinal Diabetic mgmt - yes, managing at home with pills/insulin. Per wife, MD states pt will need a better insulin regime   Previous Home Environment Living Arrangements: Spouse/significant other Available Help at Discharge: Family;Available 24 hours/day Type of Home: Apartment Home Layout: One level Home Access: Level entry Bathroom Shower/Tub: Engineer, manufacturing systems: Standard Home Care Services: No  Discharge Living Setting Plans for Discharge Living Setting:  Patient's home;Apartment Type of Home at Discharge: Apartment Discharge Home Layout: One level Discharge Home Access: Stairs to enter Entrance Stairs-Number of Steps: 2 Does the patient have any problems obtaining your medications?: No  Social/Family/Support Systems Patient Roles: Spouse Contact Information: significant other Windell Norfolk   Anticipated Caregiver: Haywood Lasso Anticipated Caregiver's Contact Information: see above Ability/Limitations of Caregiver: no limitations, Haywood Lasso was recently laid off from her job and is avail Scientist, water quality Availability: 24/7 Discharge Plan Discussed with Primary Caregiver: Yes Is Caregiver In Agreement with Plan?: Yes Does Caregiver/Family have Issues with Lodging/Transportation while Pt is in Rehab?: No  Goals/Additional Needs Patient/Family Goal for Rehab: mod I w/ PT and OT Expected length of stay: 5-7 days Cultural Considerations: none Dietary Needs: diet carb modified Equipment Needs: to be determined Pt/Family Agrees to Admission and willing to participate: Yes (spoke with pt and wife on 09-08-13) Program Orientation Provided & Reviewed with Pt/Caregiver Including Roles  & Responsibilities: Yes   Decrease burden of Care through  IP rehab admission: NA  Possible need for SNF placement upon discharge: not anticipated   Patient Condition: This patient's medical and functional status has changed since the consult dated: 09-06-13 in which the Rehabilitation Physician determined and documented that the patient's condition is appropriate for intensive rehabilitative care in an inpatient rehabilitation facility. See "History of Present Illness" (above) for medical update. Functional changes are: minimal assist x 2 to ambulate 60'. Patient's medical and functional status update has been discussed with the Rehabilitation physician and patient remains appropriate for inpatient rehabilitation. Will admit to inpatient rehab today.  Preadmission Screen Completed By:  Juliann Mule, PT 09/09/2013 9:03 AM ______________________________________________________________________   Discussed status with Dr. Riley Kill on 09-09-13 at [phone] and received telephone approval for admission today.  Admission Coordinator:  Campbell Lerner Hilding Quintanar,PT  Time 0916/Date 09-09-13

## 2013-09-08 NOTE — Progress Notes (Signed)
Physical Therapy Treatment Patient Details Name: [name] [name] MRN: [id number] DOB: 05/22/1966 Today's Date: 09/08/2013 Time: 4098-1191 PT Time Calculation (min): 37 min  PT Assessment / Plan / Recommendation  History of Present Illness 48 yo, s/p left CEA and ABF by dr Darrick Penna in 2013.  Patient developed left leg pain and coldness which was severe and not relieved on Saturday.  Pt is s/p THROMBECTOMY AORTA BIFEM, POSSIABLE BYPASS GRAFT AXILLA-BIFEMORAL .  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 +2 to follow with chair for safety, however pt did much better today and can be +1 next visit. Girlfriend present throughout session. Both educated on techniques to assist with diminishing phantom pain in Lt leg. Pain control has not been good today and they are both concerned about this. Encouraged them that this is an area that inpt rehab can also address.   Follow Up Recommendations  CIR     Does the patient have the potential to tolerate intense rehabilitation     Barriers to Discharge        Equipment Recommendations  Rolling walker with 5" wheels;3in1 (PT);Wheelchair (measurements PT);Wheelchair cushion (measurements PT);Other (comment)    Recommendations for Other Services    Frequency Min 3X/week   Progress towards PT Goals Progress towards PT goals: Progressing toward goals  Plan Current plan remains appropriate    Precautions / Restrictions Precautions Precautions: Fall Restrictions Weight Bearing Restrictions: No Other Position/Activity Restrictions: Pt new L BKA   Pertinent Vitals/Pain 10/10 at end of session; RN provided medication to assist with pain control; pt uncomfortable in recliner and assisted him back to bed and onto his left side    Mobility  Bed Mobility Overal bed mobility: Needs Assistance Bed Mobility: Supine to Sit;Sit to Supine Supine to sit: Min guard Sit to  supine: Supervision General bed mobility comments: increased time due to pain and slow processing Transfers Overall transfer level: Needs assistance Equipment used: Rolling walker (2 wheeled) Transfers: Sit to/from [employer] Sit to Stand: Min assist Stand pivot transfers: Min assist General transfer comment: cues for hand placement and sequencing; Pt using LUE on center of RW and RUE push off surface Ambulation/Gait Ambulation/Gait assistance: Min assist;+2 safety/equipment Ambulation Distance (Feet): 60 Feet Assistive device: Rolling walker (2 wheeled) Gait Pattern/deviations: Step-to pattern Gait velocity: very decreased due to pain  General Gait Details: Good upright posture, successful swing to gait.  Needs occaisional cues to not swing too far.  Needed steady assist    Exercises Amputee Exercises Quad Sets: AROM;Both;5 reps;Supine Hip Flexion/Marching: AROM;Left;5 reps;Supine;Seated Knee Flexion: AROM;Left;5 reps;Seated Knee Extension: AROM;Left;5 reps;Seated Straight Leg Raises: AROM;Left;5 reps;Supine   PT Diagnosis:    PT Problem List:   PT Treatment Interventions:     PT Goals (current goals can now be found in the care plan section) Acute Rehab PT Goals Patient Stated Goal: to get better  Visit Information  Last PT Received On: 09/08/13 Assistance Needed: +1 History of Present Illness: 48 yo, s/p left CEA and ABF by dr Darrick Penna in 2013.  Patient developed left leg pain and coldness which was severe and not relieved on Saturday.  Pt is s/p THROMBECTOMY AORTA BIFEM, POSSIABLE BYPASS GRAFT AXILLA-BIFEMORAL .  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    Subjective Data  Subjective: I'm not wrapped tight Patient Stated Goal: to get better   Cognition  Cognition Arousal/Alertness: Awake/alert  Behavior During Therapy: Anxious Overall Cognitive Status: Impaired/Different from baseline Area of Impairment: Safety/judgement;Awareness;Problem solving Current  Attention Level: Sustained Following Commands: Follows one step commands consistently Safety/Judgement: Decreased awareness of deficits;Decreased awareness of safety Awareness: Emergent Problem Solving: Slow processing;Difficulty sequencing;Requires verbal cues;Requires tactile cues General Comments: Pt aware that he has been confused and that some confusion persists. Labile at times.    Balance  Balance Sitting balance-Leahy Scale: Good Standing balance support: Bilateral upper extremity supported Standing balance-Leahy Scale: Poor  End of Session PT - End of Session Equipment Utilized During Treatment: Gait belt Activity Tolerance: Patient tolerated treatment well Patient left: in bed;with call bell/phone within reach;with family/visitor present Nurse Communication: Mobility status;Patient requests pain meds   GP     Jordie Schreur 09/08/2013, 7:37 PM Pager [phone]

## 2013-09-09 ENCOUNTER — Inpatient Hospital Stay (HOSPITAL_COMMUNITY)
Admission: RE | Admit: 2013-09-09 | Discharge: 2013-09-19 | DRG: 945 | Disposition: A | Discharge status: Home Health | Payer: Medicaid Other | Source: Intra-hospital | Attending: Physical Medicine & Rehabilitation | Admitting: Physical Medicine & Rehabilitation

## 2013-09-09 DIAGNOSIS — I998 Other disorder of circulatory system: Secondary | ICD-10-CM

## 2013-09-09 DIAGNOSIS — I639 Cerebral infarction, unspecified: Secondary | ICD-10-CM

## 2013-09-09 DIAGNOSIS — Z79899 Other long term (current) drug therapy: Secondary | ICD-10-CM

## 2013-09-09 DIAGNOSIS — I10 Essential (primary) hypertension: Secondary | ICD-10-CM

## 2013-09-09 DIAGNOSIS — E119 Type 2 diabetes mellitus without complications: Secondary | ICD-10-CM

## 2013-09-09 DIAGNOSIS — F419 Anxiety disorder, unspecified: Secondary | ICD-10-CM | POA: Diagnosis present

## 2013-09-09 DIAGNOSIS — Z794 Long term (current) use of insulin: Secondary | ICD-10-CM

## 2013-09-09 DIAGNOSIS — Y9210 Unspecified residential institution as the place of occurrence of the external cause: Secondary | ICD-10-CM

## 2013-09-09 DIAGNOSIS — L98499 Non-pressure chronic ulcer of skin of other sites with unspecified severity: Secondary | ICD-10-CM

## 2013-09-09 DIAGNOSIS — S88119A Complete traumatic amputation at level between knee and ankle, unspecified lower leg, initial encounter: Secondary | ICD-10-CM

## 2013-09-09 DIAGNOSIS — F332 Major depressive disorder, recurrent severe without psychotic features: Secondary | ICD-10-CM

## 2013-09-09 DIAGNOSIS — I739 Peripheral vascular disease, unspecified: Secondary | ICD-10-CM

## 2013-09-09 DIAGNOSIS — D62 Acute posthemorrhagic anemia: Secondary | ICD-10-CM

## 2013-09-09 DIAGNOSIS — F17200 Nicotine dependence, unspecified, uncomplicated: Secondary | ICD-10-CM | POA: Diagnosis present

## 2013-09-09 DIAGNOSIS — G589 Mononeuropathy, unspecified: Secondary | ICD-10-CM

## 2013-09-09 DIAGNOSIS — I70209 Unspecified atherosclerosis of native arteries of extremities, unspecified extremity: Secondary | ICD-10-CM

## 2013-09-09 DIAGNOSIS — E785 Hyperlipidemia, unspecified: Secondary | ICD-10-CM

## 2013-09-09 DIAGNOSIS — I69919 Unspecified symptoms and signs involving cognitive functions following unspecified cerebrovascular disease: Secondary | ICD-10-CM

## 2013-09-09 DIAGNOSIS — T8789 Other complications of amputation stump: Secondary | ICD-10-CM

## 2013-09-09 DIAGNOSIS — Z7982 Long term (current) use of aspirin: Secondary | ICD-10-CM

## 2013-09-09 DIAGNOSIS — F29 Unspecified psychosis not due to a substance or known physiological condition: Secondary | ICD-10-CM

## 2013-09-09 DIAGNOSIS — F411 Generalized anxiety disorder: Secondary | ICD-10-CM

## 2013-09-09 DIAGNOSIS — E876 Hypokalemia: Secondary | ICD-10-CM

## 2013-09-09 DIAGNOSIS — R410 Disorientation, unspecified: Secondary | ICD-10-CM

## 2013-09-09 DIAGNOSIS — Y835 Amputation of limb(s) as the cause of abnormal reaction of the patient, or of later complication, without mention of misadventure at the time of the procedure: Secondary | ICD-10-CM

## 2013-09-09 DIAGNOSIS — Z5189 Encounter for other specified aftercare: Principal | ICD-10-CM

## 2013-09-09 DIAGNOSIS — F319 Bipolar disorder, unspecified: Secondary | ICD-10-CM

## 2013-09-09 LAB — GLUCOSE, CAPILLARY
GLUCOSE-CAPILLARY: 172 mg/dL — AB (ref 70–99)
GLUCOSE-CAPILLARY: 192 mg/dL — AB (ref 70–99)
Glucose-Capillary: 166 mg/dL — ABNORMAL HIGH (ref 70–99)
Glucose-Capillary: 157 mg/dL — ABNORMAL HIGH (ref 70–99)

## 2013-09-09 LAB — URINALYSIS, ROUTINE W REFLEX MICROSCOPIC
Bilirubin Urine: NEGATIVE
GLUCOSE, UA: NEGATIVE mg/dL
Hgb urine dipstick: NEGATIVE
Ketones, ur: NEGATIVE mg/dL
Leukocytes, UA: NEGATIVE
NITRITE: NEGATIVE
Protein, ur: NEGATIVE mg/dL
SPECIFIC GRAVITY, URINE: 1.015 (ref 1.005–1.030)
UROBILINOGEN UA: 4 mg/dL — AB (ref 0.0–1.0)
pH: 7 (ref 5.0–8.0)

## 2013-09-09 MED ORDER — OXYCODONE HCL 5 MG PO TABS
5.0000 mg | ORAL_TABLET | ORAL | Status: DC | PRN
  Administered 2013-09-09 – 2013-09-15 (×27): 10 mg via ORAL
  Filled 2013-09-09 (×9): qty 2
  Filled 2013-09-09: qty 1
  Filled 2013-09-09 (×21): qty 2

## 2013-09-09 MED ORDER — CEPHALEXIN 500 MG PO CAPS
500.0000 mg | ORAL_CAPSULE | Freq: Four times a day (QID) | ORAL | Status: AC
  Administered 2013-09-09 – 2013-09-18 (×36): 500 mg via ORAL
  Filled 2013-09-09 (×38): qty 1

## 2013-09-09 MED ORDER — PHENOL 1.4 % MT LIQD: 1.0000 | OROMUCOSAL | Status: DC | PRN

## 2013-09-09 MED ORDER — NICOTINE 14 MG/24HR TD PT24
14.0000 mg | MEDICATED_PATCH | Freq: Every day | TRANSDERMAL | Status: DC
  Administered 2013-09-10 – 2013-09-19 (×10): 14 mg via TRANSDERMAL
  Filled 2013-09-09 (×12): qty 1

## 2013-09-09 MED ORDER — ACETAMINOPHEN 325 MG PO TABS
325.0000 mg | ORAL_TABLET | ORAL | Status: DC | PRN
  Administered 2013-09-13: 650 mg via ORAL
  Filled 2013-09-09 (×4): qty 2

## 2013-09-09 MED ORDER — INSULIN ASPART 100 UNIT/ML ~~LOC~~ SOLN
0.0000 [IU] | Freq: Three times a day (TID) | SUBCUTANEOUS | Status: DC
  Administered 2013-09-09 – 2013-09-10 (×2): 4 [IU] via SUBCUTANEOUS
  Administered 2013-09-10: 3 [IU] via SUBCUTANEOUS
  Administered 2013-09-10 – 2013-09-11 (×4): 4 [IU] via SUBCUTANEOUS
  Administered 2013-09-12: 3 [IU] via SUBCUTANEOUS
  Administered 2013-09-12 – 2013-09-13 (×4): 4 [IU] via SUBCUTANEOUS
  Administered 2013-09-14 – 2013-09-16 (×7): 3 [IU] via SUBCUTANEOUS
  Administered 2013-09-16: 7 [IU] via SUBCUTANEOUS
  Administered 2013-09-17 – 2013-09-19 (×5): 3 [IU] via SUBCUTANEOUS

## 2013-09-09 MED ORDER — WHITE PETROLATUM GEL: Status: DC | PRN

## 2013-09-09 MED ORDER — METHOCARBAMOL 500 MG PO TABS
500.0000 mg | ORAL_TABLET | Freq: Four times a day (QID) | ORAL | Status: DC | PRN
  Administered 2013-09-09 – 2013-09-12 (×10): 500 mg via ORAL
  Filled 2013-09-09 (×11): qty 1

## 2013-09-09 MED ORDER — DIPHENHYDRAMINE HCL 12.5 MG/5ML PO ELIX
12.5000 mg | ORAL_SOLUTION | Freq: Four times a day (QID) | ORAL | Status: DC | PRN
  Administered 2013-09-17: 25 mg via ORAL
  Filled 2013-09-09: qty 10

## 2013-09-09 MED ORDER — GABAPENTIN 400 MG PO CAPS
400.0000 mg | ORAL_CAPSULE | Freq: Three times a day (TID) | ORAL | Status: DC
  Administered 2013-09-09 – 2013-09-12 (×9): 400 mg via ORAL
  Filled 2013-09-09 (×13): qty 1

## 2013-09-09 MED ORDER — ENOXAPARIN SODIUM 40 MG/0.4ML ~~LOC~~ SOLN
40.0000 mg | SUBCUTANEOUS | Status: DC
  Administered 2013-09-09 – 2013-09-18 (×10): 40 mg via SUBCUTANEOUS
  Filled 2013-09-09 (×11): qty 0.4

## 2013-09-09 MED ORDER — GUAIFENESIN-DM 100-10 MG/5ML PO SYRP: 5.0000 mL | ORAL_SOLUTION | Freq: Four times a day (QID) | ORAL | Status: DC | PRN

## 2013-09-09 MED ORDER — POLYSACCHARIDE IRON COMPLEX 150 MG PO CAPS
150.0000 mg | ORAL_CAPSULE | Freq: Every day | ORAL | Status: DC
  Administered 2013-09-09 – 2013-09-12 (×4): 150 mg via ORAL
  Filled 2013-09-09 (×5): qty 1

## 2013-09-09 MED ORDER — GABAPENTIN 100 MG PO CAPS: 100.0000 mg | ORAL_CAPSULE | Freq: Three times a day (TID) | ORAL | Status: DC

## 2013-09-09 MED ORDER — PANTOPRAZOLE SODIUM 40 MG PO TBEC
40.0000 mg | DELAYED_RELEASE_TABLET | Freq: Every day | ORAL | Status: DC
  Administered 2013-09-10 – 2013-09-19 (×10): 40 mg via ORAL
  Filled 2013-09-09 (×11): qty 1

## 2013-09-09 MED ORDER — METFORMIN HCL 500 MG PO TABS
500.0000 mg | ORAL_TABLET | Freq: Two times a day (BID) | ORAL | Status: DC
  Administered 2013-09-09 – 2013-09-19 (×20): 500 mg via ORAL
  Filled 2013-09-09 (×22): qty 1

## 2013-09-09 MED ORDER — OXYCODONE-ACETAMINOPHEN 5-325 MG PO TABS: 1.0000 | ORAL_TABLET | ORAL | Status: DC | PRN

## 2013-09-09 MED ORDER — ALUM & MAG HYDROXIDE-SIMETH 200-200-20 MG/5ML PO SUSP: 30.0000 mL | ORAL | Status: DC | PRN

## 2013-09-09 MED ORDER — FAMOTIDINE 10 MG PO TABS
10.0000 mg | ORAL_TABLET | Freq: Two times a day (BID) | ORAL | Status: DC
  Administered 2013-09-09 – 2013-09-19 (×20): 10 mg via ORAL
  Filled 2013-09-09 (×24): qty 1

## 2013-09-09 MED ORDER — PROCHLORPERAZINE 25 MG RE SUPP
12.5000 mg | Freq: Four times a day (QID) | RECTAL | Status: DC | PRN
  Filled 2013-09-09: qty 1

## 2013-09-09 MED ORDER — PROCHLORPERAZINE MALEATE 5 MG PO TABS
5.0000 mg | ORAL_TABLET | Freq: Four times a day (QID) | ORAL | Status: DC | PRN
  Filled 2013-09-09: qty 2

## 2013-09-09 MED ORDER — ATENOLOL 100 MG PO TABS
100.0000 mg | ORAL_TABLET | Freq: Every day | ORAL | Status: DC
  Administered 2013-09-10 – 2013-09-19 (×10): 100 mg via ORAL
  Filled 2013-09-09 (×11): qty 1

## 2013-09-09 MED ORDER — PROCHLORPERAZINE EDISYLATE 5 MG/ML IJ SOLN
5.0000 mg | Freq: Four times a day (QID) | INTRAMUSCULAR | Status: DC | PRN
  Filled 2013-09-09: qty 2

## 2013-09-09 MED ORDER — ASPIRIN EC 325 MG PO TBEC
325.0000 mg | DELAYED_RELEASE_TABLET | Freq: Every day | ORAL | Status: DC
  Administered 2013-09-10 – 2013-09-19 (×10): 325 mg via ORAL
  Filled 2013-09-09 (×11): qty 1

## 2013-09-09 MED ORDER — GABAPENTIN 400 MG PO CAPS: 400.0000 mg | ORAL_CAPSULE | Freq: Three times a day (TID) | ORAL | Status: DC

## 2013-09-09 MED ORDER — HYDROCHLOROTHIAZIDE 50 MG PO TABS
50.0000 mg | ORAL_TABLET | Freq: Every day | ORAL | Status: DC
  Administered 2013-09-10 – 2013-09-11 (×2): 50 mg via ORAL
  Filled 2013-09-09 (×4): qty 1

## 2013-09-09 MED ORDER — ONDANSETRON HCL 4 MG/2ML IJ SOLN: 4.0000 mg | Freq: Four times a day (QID) | INTRAMUSCULAR | Status: DC | PRN

## 2013-09-09 MED ORDER — LISINOPRIL 20 MG PO TABS
20.0000 mg | ORAL_TABLET | Freq: Two times a day (BID) | ORAL | Status: DC
  Administered 2013-09-09 – 2013-09-19 (×20): 20 mg via ORAL
  Filled 2013-09-09 (×23): qty 1

## 2013-09-09 MED ORDER — CEPHALEXIN 500 MG PO CAPS: 500.0000 mg | ORAL_CAPSULE | Freq: Four times a day (QID) | ORAL | Status: DC

## 2013-09-09 MED ORDER — ALPRAZOLAM 0.25 MG PO TABS
1.0000 mg | ORAL_TABLET | Freq: Every day | ORAL | Status: DC
  Administered 2013-09-10 – 2013-09-14 (×5): 1 mg via ORAL
  Filled 2013-09-09 (×5): qty 4

## 2013-09-09 MED ORDER — GUAIFENESIN-DM 100-10 MG/5ML PO SYRP: 15.0000 mL | ORAL_SOLUTION | ORAL | Status: DC | PRN

## 2013-09-09 MED ORDER — AMPHETAMINE-DEXTROAMPHETAMINE 10 MG PO TABS
10.0000 mg | ORAL_TABLET | Freq: Every day | ORAL | Status: DC
  Administered 2013-09-10 – 2013-09-19 (×10): 10 mg via ORAL
  Filled 2013-09-09 (×10): qty 1

## 2013-09-09 MED ORDER — ALPRAZOLAM 0.25 MG PO TABS
0.5000 mg | ORAL_TABLET | Freq: Two times a day (BID) | ORAL | Status: DC | PRN
  Administered 2013-09-09 – 2013-09-12 (×4): 0.5 mg via ORAL
  Filled 2013-09-09 (×5): qty 2

## 2013-09-09 MED ORDER — DOCUSATE SODIUM 100 MG PO CAPS
100.0000 mg | ORAL_CAPSULE | Freq: Every day | ORAL | Status: DC
  Administered 2013-09-10 – 2013-09-19 (×10): 100 mg via ORAL
  Filled 2013-09-09 (×11): qty 1

## 2013-09-09 MED ORDER — INSULIN GLARGINE 100 UNIT/ML ~~LOC~~ SOLN
40.0000 [IU] | Freq: Every day | SUBCUTANEOUS | Status: DC
  Administered 2013-09-09 – 2013-09-18 (×10): 40 [IU] via SUBCUTANEOUS
  Filled 2013-09-09 (×11): qty 0.4

## 2013-09-09 MED ORDER — POTASSIUM CHLORIDE ER 10 MEQ PO TBCR
20.0000 meq | EXTENDED_RELEASE_TABLET | Freq: Two times a day (BID) | ORAL | Status: DC
  Administered 2013-09-10 – 2013-09-11 (×4): 20 meq via ORAL
  Filled 2013-09-09 (×8): qty 2

## 2013-09-09 MED ORDER — POTASSIUM CHLORIDE ER 10 MEQ PO TBCR
20.0000 meq | EXTENDED_RELEASE_TABLET | Freq: Two times a day (BID) | ORAL | Status: DC
  Administered 2013-09-09 – 2013-09-12 (×2): 20 meq via ORAL
  Filled 2013-09-09 (×8): qty 2

## 2013-09-09 MED ORDER — TRAZODONE HCL 50 MG PO TABS
25.0000 mg | ORAL_TABLET | Freq: Every evening | ORAL | Status: DC | PRN
  Administered 2013-09-14: 50 mg via ORAL
  Filled 2013-09-09: qty 1

## 2013-09-09 NOTE — Progress Notes (Signed)
Rehab admissions - Bed is available today for acute inpatient rehab and pt was medically cleared by Dr. Leonides SakeBrian Chen, vascular. Updated Raynelle FanningJulie case manager and updated pt/wife as well.  Will admit pt later today to CIR. Please call me with any questions. Thanks.  Juliann MuleJanine Joelle Flessner, PT Rehabilitation Admissions Coordinator [phone]

## 2013-09-09 NOTE — Progress Notes (Signed)
[facility]  Subjective  - Still complains of phantom pain.   Objective 129/73 83 98.2 F (36.8 C) (Oral) 18 98%  Intake/Output Summary (Last 24 hours) at 09/09/13 1010 Last data filed at 09/08/13 1630  Gross per 24 hour  Intake    780 ml  Output    700 ml  Net     80 ml    Left BKA site clean and dry. Stump warm to touch. Bilateral groins superficial min. Drainage.   Assessment/Planning: POD #4  s/p L BKA, POD #8 s/p L iliac TE, L fem EA with patch angio, R iliac TE, L to R fem-fem BPG, L pop and tib. TE, vein patch L Pop, 4 compartment fasciotomy  D/C to CIR Groin dry dressings daily, stump compression dressing as tolerates.  Staples will be removed in 4 weeks from surgery.  [name], EMMA [facility] 09/09/2013 10:10 AM --  Laboratory Lab Results:  Recent Labs  09/07/13 0500  WBC 11.2*  HGB 8.9*  HCT 26.8*  PLT 324   BMET  Recent Labs  09/07/13 0500  NA 134*  K 3.3*  CL 89*  CO2 31  GLUCOSE 147*  BUN 8  CREATININE 0.90  CALCIUM 8.9    COAG Lab Results  Component Value Date   INR 1.14 09/05/2013   INR 0.99 08/30/2013   INR 1.36 01/12/2012   No results found for this basename: PTT

## 2013-09-09 NOTE — Progress Notes (Signed)
Patient arrived from 2W with RN and family via bed. Arrived with belongings. Patient and family received instructions on rehab process, fall prevention plan, safety plan and call bell system with verbal understanding. Bed alarm on and call bell with patient. Family staying with patient. Will continue to monitor.

## 2013-09-09 NOTE — Care Management Note (Signed)
    Page 1 of 1   09/09/2013     4:45:58 PM   CARE MANAGEMENT NOTE 09/09/2013  Patient:  [name],[name]   Account Number:  [id number]  Date Initiated:  09/01/2013  Documentation initiated by:  Donn PieriniWEBSTER,KRISTI  Subjective/Objective Assessment:   Pt admitted with ischemic foot- s/p  Left Iliac Thrombectomy, Left and Right Femoral Endarterectomy, Left to Right Femoral By Pass Graft, Right Iliac Thrombectomy, Left Popliteal and Left Tibial Embolectomy, Four Compartment Fasciotomy     Action/Plan:   PTA pt lived at home with spouse- NCM to follow pt progress for d/c needs   Anticipated DC Date:  09/09/2013   Anticipated DC Plan:  IP REHAB FACILITY         Choice offered to / List presented to:             Status of service:  Completed, signed off Medicare Important Message given?   (If response is "NO", the following Medicare IM given date fields will be blank) Date Medicare IM given:   Date Additional Medicare IM given:    Discharge Disposition:  IP REHAB FACILITY  Per UR Regulation:  Reviewed for med. necessity/level of care/duration of stay  If discussed at Long Length of Stay Meetings, dates discussed:   09/08/2013    Comments:  09/09/13 [name] Deskin,RN,BSN [phone] PT S/P BKA ON 09/05/13.  TO IP REHAB TODAY.   09/01/13- 1100- Donn PieriniKristi Webster RN, BSN [phone] s/p  Left Iliac Thrombectomy, Left and Right Femoral Endarterectomy, Left to Right Femoral By Pass Graft, Right Iliac Thrombectomy, Left Popliteal and Left Tibial Embolectomy, Patch Angioplasty of Left Common Femoral Artery.  Four Compartment Fasciotomy and Arteriogram (Bilateral)  on 08/31/13 Plan:  Left foot ischemia left BKA vs AKA on Monday --Fasciotomy wound VAC today

## 2013-09-09 NOTE — Discharge Summary (Signed)
[facility] Discharge Summary   Patient ID:  Ugochukwu Chichester MRN: [id number] DOB/AGE: [birth year] 48 y.o.  Admit date: 08/30/2013 Discharge date: 09/09/2013 Date of Surgery: 08/30/2013 - 09/05/2013 Surgeon: Surgeon(s): [name], MD  Admission Diagnosis: ischemic leg BLOOD CLOT Left Foot Ischemia   Discharge Diagnoses:  ischemic leg BLOOD CLOT Left Foot Ischemia   Secondary Diagnoses: Past Medical History  Diagnosis Date  . GERD (gastroesophageal reflux disease)   . Nicotine addiction   . ETOH abuse   . Anxiety   . Hypertension     no pcp     saw peter nisham once  . Stroke 08/11/11    slurred speech  . Peripheral vascular disease   . Diabetes mellitus without complication   . Hyperlipidemia     Procedure(s): AMPUTATION BELOW KNEE  Discharged Condition: good  HPI: 48 yo, s/p left CEA and ABF by dr Darrick Penna in 2013. Patient developed left leg pain and coldness which was severe and not relieved on Saturday. He called the office on Monday and was told to [facility] the ER. He did not show up until this evening (Tuesday) He is still c/o left leg pain. There are no relieving factors. The pain is constant. He denies n/v or fevers. He continues to smoke. He suffers from diabetes without complications. He is on an ACE for HTN. HE does not take a statin for hypercholesterolemia.   Dr. Darrick Penna performed Left iliac thrombectomy, left femoral endarterectomy, patch angioplasty left femoral artery, right iliac thrombectomy, left to right femoral-femoral bypass, left popliteal and tibial embolectomy, vein patch angioplasty left popliteal artery, intraoperative arteriogram, left leg 4 compartment fasciotomy. He developedLeft foot ischemia left BKA on Monday 09/05/2013.  He has had increased phantom pain.  The PCA was discharged and he was put on oxycodone 10 mg q4 with an increase to his gabapentin from 300 mg TID to 400 mg TID.  He was evaluated by CIR and accepted to rehab today.  09/09/2013.  He will need to continue groin dry dressings due to superficial clear drainage.  [employer] was consulted for future prothesis.  He will follow up in 4 weeks for staple removal and groin checks.  He will continue Keflex for 2 weeks post discharge to cover the groins as precaution for fem-fem and aortobifem grafts, given breakdown in skin barrier.     Hospital Course:  Casson Catena is a 48 y.o. male is S/P Left Procedure(s): AMPUTATION BELOW KNEE Extubated: POD # 0 Physical exam: Left BKA site clean and dry.  Stump warm to touch.  Bilateral groins superficial min. Drainage  Post-op wounds healing well Pt. Ambulating, voiding and taking PO diet without difficulty. Pt pain controlled with PO pain meds. Labs as below Complications:see HPI  Consults:  Treatment Team:  Fransisco Hertz, MD  Significant Diagnostic Studies: CBC Lab Results  Component Value Date   WBC 11.2* 09/07/2013   HGB 8.9* 09/07/2013   HCT 26.8* 09/07/2013   MCV 83.8 09/07/2013   PLT 324 09/07/2013    BMET    Component Value Date/Time   NA 134* 09/07/2013 0500   K 3.3* 09/07/2013 0500   CL 89* 09/07/2013 0500   CO2 31 09/07/2013 0500   GLUCOSE 147* 09/07/2013 0500   BUN 8 09/07/2013 0500   CREATININE 0.90 09/07/2013 0500   CALCIUM 8.9 09/07/2013 0500   GFRNONAA >90 09/07/2013 0500   GFRAA >90 09/07/2013 0500   COAG Lab Results  Component Value Date   INR  1.14 09/05/2013   INR 0.99 08/30/2013   INR 1.36 01/12/2012     Disposition:  Discharge to :Rehab Discharge Orders   Future Appointments Provider Department Dept Phone   09/22/2013 1:30 PM [name], MD [facility] [phone]   10/13/2013 1:30 PM [facility] [place] [facility] [address] [phone]   10/13/2013 2:00 PM [facility] [place] [facility] [address] [phone]   10/13/2013 2:30 PM [facility] [place] [facility] [address] [phone]   10/13/2013 3:30 PM [name],  MD [facility] [phone]   Future Orders Complete By Expires   Ambulatory referral to Nutrition and Diabetic Education  As directed    Comments:     Likely will need 1:1.  Has Hx. Of CVA and for BKA.   Call MD for:  redness, tenderness, or signs of infection (pain, swelling, bleeding, redness, odor or green/yellow discharge around incision site)  As directed    Call MD for:  severe or increased pain, loss or decreased feeling  in affected limb(s)  As directed    Call MD for:  temperature >100.5  As directed    Driving Restrictions  As directed    Comments:     No driving for 6 weeks   Lifting restrictions  As directed    Comments:     No lifting for 6 weeks   Resume previous diet  As directed        Medication List         ALPRAZolam 1 MG tablet  Commonly known as:  XANAX  Take 1 mg by mouth daily.     amphetamine-dextroamphetamine 10 MG tablet  Commonly known as:  ADDERALL  Take 10 mg by mouth daily.     aspirin 325 MG EC tablet  Take 325 mg by mouth daily.     atenolol 100 MG tablet  Commonly known as:  TENORMIN  Take 100 mg by mouth daily.     CINNAMON PO  Take 500 mg by mouth 2 (two) times daily.     gabapentin 400 MG capsule  Commonly known as:  NEURONTIN  Take 1 capsule (400 mg total) by mouth 3 (three) times daily.     insulin glargine 100 UNIT/ML injection  Commonly known as:  LANTUS  Inject 40 Units into the skin at bedtime.     insulin NPH-regular Human (70-30) 100 UNIT/ML injection  Commonly known as:  NOVOLIN 70/30  Inject 8-12 Units into the skin daily as needed. Take 12 units if sugar is over 200.     lisinopril 20 MG tablet  Commonly known as:  PRINIVIL,ZESTRIL  Take 20 mg by mouth 2 (two) times daily.     metFORMIN 1000 MG tablet  Commonly known as:  GLUCOPHAGE  Take 1,000 mg by mouth 2 (two) times daily with a meal.     oxyCODONE-acetaminophen 5-325 MG per tablet  Commonly known as:  PERCOCET/ROXICET  Take  1-2 tablets by mouth every 4 (four) hours as needed for moderate pain.     potassium chloride 10 MEQ tablet  Commonly known as:  K-DUR  Take 10 mEq by mouth daily.     VITAMIN B-12 PO  Take 1 tablet by mouth daily.     zolpidem 10 MG tablet  Commonly known as:  AMBIEN  Take 10 mg by mouth at bedtime as needed for sleep.       Verbal and written  Discharge instructions given to the patient. Wound care per Discharge AVS     Follow-up Information   Follow up with Sherren KernsFIELDS,CHARLES E, MD In 4 weeks. (Office will call you to arrange your appt (sent))    Specialty:  Vascular Surgery   Contact information:   [address] [place] [address] [phone]      Continue Keflex for 2 weeks.  SignedMosetta Pigeon: Zygmunt Mcglinn MAUREEN 09/09/2013, 10:25 AM

## 2013-09-09 NOTE — Progress Notes (Signed)
Pt's girlfriend changed dressing. Stump dressing was not changed because stump sock is not available for the pt at this time. Pt's girlfriend said she was told that the stump sock would be delivered to the room tomorrow.   [name] [name]

## 2013-09-09 NOTE — Progress Notes (Signed)
Pt discharged from unit to CIR.  A&O x4.  Pain is controlled.  Dressings dry/intact.  [facility]  IV D/C'd.  Pt transported on bed by RN.

## 2013-09-10 ENCOUNTER — Inpatient Hospital Stay (HOSPITAL_COMMUNITY): Payer: Medicaid Other | Admitting: Physical Therapy

## 2013-09-10 ENCOUNTER — Inpatient Hospital Stay (HOSPITAL_COMMUNITY): Payer: Medicaid Other | Admitting: *Deleted

## 2013-09-10 ENCOUNTER — Inpatient Hospital Stay (HOSPITAL_COMMUNITY): Payer: Medicaid Other | Admitting: Speech Pathology

## 2013-09-10 DIAGNOSIS — I6350 Cerebral infarction due to unspecified occlusion or stenosis of unspecified cerebral artery: Secondary | ICD-10-CM

## 2013-09-10 DIAGNOSIS — S88119A Complete traumatic amputation at level between knee and ankle, unspecified lower leg, initial encounter: Secondary | ICD-10-CM

## 2013-09-10 DIAGNOSIS — E119 Type 2 diabetes mellitus without complications: Secondary | ICD-10-CM

## 2013-09-10 DIAGNOSIS — I999 Unspecified disorder of circulatory system: Secondary | ICD-10-CM

## 2013-09-10 DIAGNOSIS — I10 Essential (primary) hypertension: Secondary | ICD-10-CM

## 2013-09-10 LAB — URINE CULTURE
Colony Count: NO GROWTH
Culture: NO GROWTH

## 2013-09-10 LAB — GLUCOSE, CAPILLARY
GLUCOSE-CAPILLARY: 161 mg/dL — AB (ref 70–99)
GLUCOSE-CAPILLARY: 124 mg/dL — AB (ref 70–99)
Glucose-Capillary: 146 mg/dL — ABNORMAL HIGH (ref 70–99)
Glucose-Capillary: 128 mg/dL — ABNORMAL HIGH (ref 70–99)

## 2013-09-10 MED ORDER — MORPHINE SULFATE 2 MG/ML IJ SOLN: 2.0000 mg | Freq: Four times a day (QID) | INTRAMUSCULAR | Status: AC | PRN

## 2013-09-10 MED ORDER — FENTANYL 25 MCG/HR TD PT72
25.0000 ug | MEDICATED_PATCH | TRANSDERMAL | Status: DC
  Administered 2013-09-10: 25 ug via TRANSDERMAL
  Filled 2013-09-10: qty 1

## 2013-09-10 NOTE — Progress Notes (Signed)
Bil. groin dressings changed this eve;   Right groin incision with open area midline incision, 1.5x1.0x1.75 depth; tunnels 2cm at 1100. 1cm at 1200; 70% red, 30% yellow. No drainage.   Cleansed and redressed, will alert MD in AM.  ( Dr. Darrick PennaFields not on call this eve)   Left groin incision with shallow open area, 90% yellow 10% red; measures 2cms around. No drainage; cleansed and redressed, monitor.
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
## 2013-09-10 NOTE — Progress Notes (Signed)
Physical Therapy Session Note  Patient Details  Name: [name] LauthRobert Darr MRN: [id number] Date of Birth: [birth year]  Today's Date: 09/10/2013 Time: 1315-1330 Time Calculation (min): 15 min  Short Term Goals: Week 1:  PT Short Term Goal 1 (Week 1): Pt will transfer supine <> edge of bed with S. PT Short Term Goal 2 (Week 1): Pt will transfers w/c <> bed with S.  PT Short Term Goal 3 (Week 1): Pt will ambulate with rolling walker about 50 feet with min A.  PT Short Term Goal 4 (Week 1): Pt will ascend/descend 2 stairs with mod A.  PT Short Term Goal 5 (Week 1): Pt will propel w/c about 150 feet with B UEs and R LE S only.  Skilled Therapeutic Interventions/Progress Updates:  Pt was seen bedside in the pm. Pt transferred supine to edge of bed with side rail, trapeze and min A. Pt tolerated edge of bed about 10 mins with S. Pt transferred sit to stand from edge of bed with rolling walker and min A. Pt ambulated with rolling walker and min A about 20 feet. Pt transferred edge of bed to supine with min A and verbal cues.   Therapy Documentation Precautions:  Precautions Precautions: Fall Restrictions Weight Bearing Restrictions: Yes LLE Weight Bearing: Weight bearing as tolerated Other Position/Activity Restrictions: L BKA General: Chart Reviewed: Yes Amount of Missed PT Time (min): 15 Minutes Missed Time Reason: Pain Family/Caregiver Present: No Vital Signs:   Pain: Pt c/o 9/10 pain L residual limb.  See FIM for current functional status  Therapy/Group: Individual Therapy  Rayford HalstedMitchell, [name] 09/10/2013, 1:34 PM

## 2013-09-10 NOTE — Progress Notes (Signed)
[name] LauthRobert [name] is a 48 y.o. male [birth year] [id number]  Subjective: C/o severe pain in LLE BKA - screaming w/pain  Objective: Vital signs in last 24 hours: Temp:  [99 F (37.2 C)-99.3 F (37.4 C)] 99 F (37.2 C) (03/14 0458) Pulse Rate:  [85-99] 91 (03/14 0458) Resp:  [18-19] 19 (03/14 0458) BP: (115-149)/(63-78) 116/63 mmHg (03/14 0458) SpO2:  [95 %-98 %] 95 % (03/14 0458) Weight:  [224 lb 3.3 oz (101.7 kg)] 224 lb 3.3 oz (101.7 kg) (03/13 1416) Weight change:  Last BM Date: 09/07/13  Intake/Output from previous day: 03/13 0701 - 03/14 0700 In: 540 [P.O.:540] Out: 750 [Urine:750] Last cbgs: CBG (last 3)   Recent Labs  09/09/13 1642 09/09/13 2151 09/10/13 0743  GLUCAP 157* 192* 146*     Physical Exam General: In apparent distress due to pain HEENT: not dry Lungs: Normal effort. Lungs clear to auscultation, no crackles or wheezes. Cardiovascular: Regular rate and rhythm, no edema Abdomen: S/NT/ND; BS(+) Musculoskeletal:  unchanged Neurological: No new neurological deficits Wounds: L BKA - clean - minor d/c in one place, not hot Skin: clear  Aging changes Mental state: Alert, oriented, cooperative    Lab Results: BMET    Component Value Date/Time   NA 134* 09/07/2013 0500   K 3.3* 09/07/2013 0500   CL 89* 09/07/2013 0500   CO2 31 09/07/2013 0500   GLUCOSE 147* 09/07/2013 0500   BUN 8 09/07/2013 0500   CREATININE 0.90 09/07/2013 0500   CALCIUM 8.9 09/07/2013 0500   GFRNONAA >90 09/07/2013 0500   GFRAA >90 09/07/2013 0500   CBC    Component Value Date/Time   WBC 11.2* 09/07/2013 0500   RBC 3.20* 09/07/2013 0500   HGB 8.9* 09/07/2013 0500   HCT 26.8* 09/07/2013 0500   PLT 324 09/07/2013 0500   MCV 83.8 09/07/2013 0500   MCH 27.8 09/07/2013 0500   MCHC 33.2 09/07/2013 0500   RDW 13.9 09/07/2013 0500   LYMPHSABS 2.6 08/30/2013 1824   MONOABS 0.5 08/30/2013 1824   EOSABS 1.4* 08/30/2013 1824   BASOSABS 0.1 08/30/2013 1824    Studies/Results: No results  found.  Medications: I have reviewed the patient's current medications.  Assessment/Plan:  1. DVT Prophylaxis/Anticoagulation: Pharmaceutical: Lovenox  2. Pain Management: continue oxycodone prn. Resume neurontin for neuropathy. Start Duragesic patch. Will use sq MS for 24 hrs prn. 3. Anxiety disorder/Mood: continues to be anxious and disoriented. LCSW to follow for evaluation and support. Continue Xanax daily and will continue xanax to 0.5 mg bid prn.  4. Neuropsych: This patient is not capable of making decisions on his own behalf.  5. HTN: Monitor with bid checks. Continue atenolol and lisinopril  6. DM type 2: Monitor BS with ac/hs checks. Discontinue glipizide and resume metformin. And slowly resume 70/30 insulin as indicated. Use SSI for elevated BS.  7. ABLA: will recheck on Monday. Add iron supplement.  8. Hypokalemia: Likely dilutional. D/C IVF and supplement.      Length of stay, days: 1  [name] PrimesAlex Karman [name] , MD 09/10/2013, 8:37 AM

## 2013-09-11 DIAGNOSIS — F411 Generalized anxiety disorder: Secondary | ICD-10-CM

## 2013-09-11 DIAGNOSIS — I739 Peripheral vascular disease, unspecified: Secondary | ICD-10-CM

## 2013-09-11 LAB — GLUCOSE, CAPILLARY
GLUCOSE-CAPILLARY: 187 mg/dL — AB (ref 70–99)
Glucose-Capillary: 165 mg/dL — ABNORMAL HIGH (ref 70–99)
Glucose-Capillary: 166 mg/dL — ABNORMAL HIGH (ref 70–99)
Glucose-Capillary: 165 mg/dL — ABNORMAL HIGH (ref 70–99)

## 2013-09-11 NOTE — Progress Notes (Signed)
[name] LauthRobert [name] is a 48 y.o. male 1965/07/12 [id number]  Subjective: F/u HTN, DM F/u severe pain in LLE BKA - much better; tolerating PT ok  Objective: Vital signs in last 24 hours: Temp:  [99.2 F (37.3 C)-99.7 F (37.6 C)] 99.2 F (37.3 C) (03/15 0517) Pulse Rate:  [63-84] 63 (03/15 0517) Resp:  [18-20] 18 (03/15 0517) BP: (110-118)/(69-85) 116/75 mmHg (03/15 0517) SpO2:  [93 %-98 %] 93 % (03/15 0517) Weight change:  Last BM Date: 09/10/13  Intake/Output from previous day: 03/14 0701 - 03/15 0700 In: 840 [P.O.:840] Out: -  Last cbgs: CBG (last 3)   Recent Labs  09/10/13 1718 09/10/13 2025 09/11/13 0742  GLUCAP 128* 124* 165*     Physical Exam General: In apparent distress due to pain HEENT: not dry Lungs: Normal effort. Lungs clear to auscultation, no crackles or wheezes. Cardiovascular: Regular rate and rhythm, no edema Abdomen: S/NT/ND; BS(+) umbilical hernia Musculoskeletal:  unchanged Neurological: No new neurological deficits Wounds: L BKA - clean - minor d/c in one place, not hot; ?R groin seroma tract and a small infiltrate Skin: clear  Aging changes Mental state: Alert, oriented, cooperative    Lab Results: BMET    Component Value Date/Time   NA 134* 09/07/2013 0500   K 3.3* 09/07/2013 0500   CL 89* 09/07/2013 0500   CO2 31 09/07/2013 0500   GLUCOSE 147* 09/07/2013 0500   BUN 8 09/07/2013 0500   CREATININE 0.90 09/07/2013 0500   CALCIUM 8.9 09/07/2013 0500   GFRNONAA >90 09/07/2013 0500   GFRAA >90 09/07/2013 0500   CBC    Component Value Date/Time   WBC 11.2* 09/07/2013 0500   RBC 3.20* 09/07/2013 0500   HGB 8.9* 09/07/2013 0500   HCT 26.8* 09/07/2013 0500   PLT 324 09/07/2013 0500   MCV 83.8 09/07/2013 0500   MCH 27.8 09/07/2013 0500   MCHC 33.2 09/07/2013 0500   RDW 13.9 09/07/2013 0500   LYMPHSABS 2.6 08/30/2013 1824   MONOABS 0.5 08/30/2013 1824   EOSABS 1.4* 08/30/2013 1824   BASOSABS 0.1 08/30/2013 1824    Studies/Results: No results  found.  Medications: I have reviewed the patient's current medications.  Assessment/Plan:  1. DVT Prophylaxis/Anticoagulation: Pharmaceutical: Lovenox  2. Pain Management: continue oxycodone prn. Resume neurontin for neuropathy. Start Duragesic patch. Will use sq MS for 24 hrs prn. 3. Anxiety disorder/Mood: continues to be anxious and disoriented. LCSW to follow for evaluation and support. Continue Xanax daily and will continue xanax to 0.5 mg bid prn.  4. Neuropsych: This patient is not capable of making decisions on his own behalf.  5. HTN: Monitor with bid checks. Continue atenolol and lisinopril  6. DM type 2: Monitor BS with ac/hs checks. Discontinue glipizide and resume metformin. And slowly resume 70/30 insulin as indicated. Use SSI for elevated BS.  7. ABLA: will recheck on Monday. Add iron supplement.  8. Hypokalemia: Likely dilutional. D/C IVF and supplement.  9.  ?R groin seroma tract and a small infiltrate - per surgery      Length of stay, days: 2  Sonda PrimesAlex [name] , MD 09/11/2013, 9:06 AM

## 2013-09-11 NOTE — Progress Notes (Signed)
0900: Mod. amt tan drainage bil. groin dressings. As noted yesterday, open area right groin, along incision line , tunneling; ( see 3/14 PN for measurements) Dr. Posey ReaPlotnikov on unit, made aware; packed loosely , NS wet to dry until further orders.   1200: Dr. Myra GianottiBrabham called, made aware of open wound, drainage, LGT; orders received ( pack as above.) Monitor.  Affect fluctuates; pain better controlled today; continues to yell out at times, pds of mild confusion upon awakening, re-orients easily.

## 2013-09-12 ENCOUNTER — Inpatient Hospital Stay (HOSPITAL_COMMUNITY): Payer: Medicaid Other | Admitting: Occupational Therapy

## 2013-09-12 ENCOUNTER — Inpatient Hospital Stay (HOSPITAL_COMMUNITY): Payer: Medicaid Other | Admitting: Rehabilitation

## 2013-09-12 DIAGNOSIS — E119 Type 2 diabetes mellitus without complications: Secondary | ICD-10-CM

## 2013-09-12 DIAGNOSIS — S88119A Complete traumatic amputation at level between knee and ankle, unspecified lower leg, initial encounter: Secondary | ICD-10-CM

## 2013-09-12 DIAGNOSIS — I10 Essential (primary) hypertension: Secondary | ICD-10-CM

## 2013-09-12 DIAGNOSIS — L98499 Non-pressure chronic ulcer of skin of other sites with unspecified severity: Secondary | ICD-10-CM

## 2013-09-12 DIAGNOSIS — I739 Peripheral vascular disease, unspecified: Secondary | ICD-10-CM

## 2013-09-12 DIAGNOSIS — D62 Acute posthemorrhagic anemia: Secondary | ICD-10-CM

## 2013-09-12 LAB — COMPREHENSIVE METABOLIC PANEL
ALK PHOS: 96 U/L (ref 39–117)
ALT: 120 U/L — ABNORMAL HIGH (ref 0–53)
AST: 115 U/L — ABNORMAL HIGH (ref 0–37)
Albumin: 2.4 g/dL — ABNORMAL LOW (ref 3.5–5.2)
BILIRUBIN TOTAL: 0.3 mg/dL (ref 0.3–1.2)
BUN: 19 mg/dL (ref 6–23)
CHLORIDE: 92 meq/L — AB (ref 96–112)
CO2: 26 meq/L (ref 19–32)
CREATININE: 1.18 mg/dL (ref 0.50–1.35)
Calcium: 7.7 mg/dL — ABNORMAL LOW (ref 8.4–10.5)
GFR calc Af Amer: 83 mL/min — ABNORMAL LOW (ref >90)
GFR calc non Af Amer: 72 mL/min — ABNORMAL LOW (ref >90)
Glucose, Bld: 194 mg/dL — ABNORMAL HIGH (ref 70–99)
POTASSIUM: 4.7 meq/L (ref 3.7–5.3)
Sodium: 130 mEq/L — ABNORMAL LOW (ref 137–147)
Total Protein: 7.6 g/dL (ref 6.0–8.3)

## 2013-09-12 LAB — CBC WITH DIFFERENTIAL/PLATELET
BASOS ABS: 0 10*3/uL (ref 0.0–0.1)
Basophils Relative: 0 % (ref 0–1)
Eosinophils Absolute: 0.2 10*3/uL (ref 0.0–0.7)
Eosinophils Relative: 2 % (ref 0–5)
HCT: 25.9 % — ABNORMAL LOW (ref 39.0–52.0)
Hemoglobin: 8.5 g/dL — ABNORMAL LOW (ref 13.0–17.0)
LYMPHS PCT: 17 % (ref 12–46)
Lymphs Abs: 1.7 10*3/uL (ref 0.7–4.0)
MCH: 27.5 pg (ref 26.0–34.0)
MCHC: 32.8 g/dL (ref 30.0–36.0)
MCV: 83.8 fL (ref 78.0–100.0)
MONO ABS: 0.6 10*3/uL (ref 0.1–1.0)
Monocytes Relative: 6 % (ref 3–12)
NEUTROS ABS: 7.5 10*3/uL (ref 1.7–7.7)
Neutrophils Relative %: 74 % (ref 43–77)
Platelets: 539 10*3/uL — ABNORMAL HIGH (ref 150–400)
RBC: 3.09 MIL/uL — ABNORMAL LOW (ref 4.22–5.81)
RDW: 14.9 % (ref 11.5–15.5)
WBC: 10.1 10*3/uL (ref 4.0–10.5)

## 2013-09-12 LAB — GLUCOSE, CAPILLARY
GLUCOSE-CAPILLARY: 120 mg/dL — AB (ref 70–99)
Glucose-Capillary: 170 mg/dL — ABNORMAL HIGH (ref 70–99)
Glucose-Capillary: 136 mg/dL — ABNORMAL HIGH (ref 70–99)
Glucose-Capillary: 130 mg/dL — ABNORMAL HIGH (ref 70–99)

## 2013-09-12 MED ORDER — OXYCODONE HCL ER 10 MG PO T12A
10.0000 mg | EXTENDED_RELEASE_TABLET | Freq: Two times a day (BID) | ORAL | Status: DC
  Administered 2013-09-12: 10 mg via ORAL
  Filled 2013-09-12: qty 1

## 2013-09-12 MED ORDER — HYDROCHLOROTHIAZIDE 25 MG PO TABS
25.0000 mg | ORAL_TABLET | Freq: Every day | ORAL | Status: DC
  Administered 2013-09-12 – 2013-09-19 (×8): 25 mg via ORAL
  Filled 2013-09-12 (×9): qty 1

## 2013-09-12 MED ORDER — ACETAMINOPHEN 325 MG PO TABS
650.0000 mg | ORAL_TABLET | Freq: Once | ORAL | Status: AC
  Administered 2013-09-12: 650 mg via ORAL

## 2013-09-12 MED ORDER — POLYSACCHARIDE IRON COMPLEX 150 MG PO CAPS
150.0000 mg | ORAL_CAPSULE | Freq: Two times a day (BID) | ORAL | Status: DC
  Administered 2013-09-12 – 2013-09-19 (×15): 150 mg via ORAL
  Filled 2013-09-12 (×16): qty 1

## 2013-09-12 MED ORDER — GABAPENTIN 400 MG PO CAPS
400.0000 mg | ORAL_CAPSULE | Freq: Two times a day (BID) | ORAL | Status: DC
  Administered 2013-09-12 – 2013-09-14 (×5): 400 mg via ORAL
  Filled 2013-09-12 (×6): qty 1

## 2013-09-12 MED ORDER — MORPHINE SULFATE ER 15 MG PO TBCR
15.0000 mg | EXTENDED_RELEASE_TABLET | Freq: Two times a day (BID) | ORAL | Status: DC
  Administered 2013-09-12 – 2013-09-14 (×4): 15 mg via ORAL
  Filled 2013-09-12 (×4): qty 1

## 2013-09-12 MED ORDER — LISINOPRIL 5 MG PO TABS: 5.0000 mg | ORAL_TABLET | Freq: Two times a day (BID) | ORAL | Status: DC

## 2013-09-12 MED ORDER — GABAPENTIN 600 MG PO TABS
600.0000 mg | ORAL_TABLET | Freq: Every day | ORAL | Status: DC
  Administered 2013-09-12 – 2013-09-13 (×2): 600 mg via ORAL
  Filled 2013-09-12 (×3): qty 1

## 2013-09-12 MED ORDER — MORPHINE SULFATE ER 15 MG PO TBCR: 15.0000 mg | EXTENDED_RELEASE_TABLET | Freq: Two times a day (BID) | ORAL | Status: DC

## 2013-09-12 MED ORDER — HYDROCHLOROTHIAZIDE 25 MG PO TABS: 25.0000 mg | ORAL_TABLET | Freq: Every day | ORAL | Status: DC

## 2013-09-12 MED ORDER — POTASSIUM CHLORIDE ER 10 MEQ PO TBCR
20.0000 meq | EXTENDED_RELEASE_TABLET | Freq: Every day | ORAL | Status: DC
Start: 2013-09-13 — End: 2013-09-19
  Administered 2013-09-13 – 2013-09-19 (×7): 20 meq via ORAL
  Filled 2013-09-12 (×10): qty 2

## 2013-09-12 MED ORDER — METHOCARBAMOL 750 MG PO TABS
750.0000 mg | ORAL_TABLET | Freq: Four times a day (QID) | ORAL | Status: DC
  Administered 2013-09-12 – 2013-09-19 (×29): 750 mg via ORAL
  Filled 2013-09-12 (×34): qty 1

## 2013-09-12 NOTE — Progress Notes (Signed)
Physical Therapy Session Note  Patient Details  Name: [name] LauthRobert Goodchild MRN: [id number] Date of Birth: 06-Sep-1965  Today's Date: 09/12/2013 Time: 1130-1200 Time Calculation (min): 30 min  Short Term Goals: Week 1:  PT Short Term Goal 1 (Week 1): Pt will transfer supine <> edge of bed with S. PT Short Term Goal 2 (Week 1): Pt will transfers w/c <> bed with S.  PT Short Term Goal 3 (Week 1): Pt will ambulate with rolling walker about 50 feet with min A.  PT Short Term Goal 4 (Week 1): Pt will ascend/descend 2 stairs with mod A.  PT Short Term Goal 5 (Week 1): Pt will propel w/c about 150 feet with B UEs and R LE S only.  Skilled Therapeutic Interventions/Progress Updates:   Pt received lying in bed, significant other in room with CSW also available in room.  Note pt to be very confused this morning.  He continues to demonstrate decreased awareness, decreased safety, hallucinations, inappropriate conversation (unrelated to what PT communicating about).  Provided max education to girlfriend about having staff perform transfers or any gait, esp when more confused and will work towards wife being trained to transfer and walk with pt.  Girlfriend verbalized understanding.  Pt transferred to EOB with min assist, crying out in pain from time to time with cues for continued breathing.  Once at EOB, performed standing with therapist in order for girlfriend to assist with pulling up shorts.  Transferred to w/c via stand pivot without device at min assist with mod to max cues for technique and sequencing.  Once in chair, MD in room to address groin dressings, therefore discussed L residual limb positioning when in w/c, bed, etc.  Discussed that goal is knee ext at all times to prevent knee flex contracture and for limb to be ready when medically ready for prosthetic limb.  Pts girlfriend verbalized understanding.  Had pt self propel w/c >150' on controlled environment with BUEs and RLE in order to increase activity  tolerance and UE strength.  Pt requires mod to max verbal cues to attend to task, as he was easily distracted in hallway.  Pt returned to room and was left in w/c.  Donned quick release belt for safety and explained to girlfriend.  She verbalized understanding.  All needs in reach.   Therapy Documentation Precautions:  Precautions Precautions: Fall Precaution Comments: Pt very confused, poor attn span, makes him a large fall risk. Restrictions Weight Bearing Restrictions: No LLE Weight Bearing: Non weight bearing Other Position/Activity Restrictions: L BKA   Pain: Pain Assessment Pain Assessment: Faces Faces Pain Scale: Hurts even more Pain Type: Acute pain;Surgical pain Pain Location: Leg Pain Orientation: Left Pain Intervention(s): Repositioned;Emotional support   Locomotion : Ambulation Ambulation/Gait Assistance: 4: Min assist Wheelchair Mobility Distance: 150   See FIM for current functional status  Therapy/Group: Individual Therapy  [name], Lajada Janes Ann 09/12/2013, 3:57 PM

## 2013-09-12 NOTE — Progress Notes (Signed)
[facility] Individual Statement of Services  Patient Name:  Leida LauthRobert Economos  Date:  09/12/2013  Welcome to the [facility].  Our goal is to provide you with an individualized program based on your diagnosis and situation, designed to meet your specific needs.  With this comprehensive rehabilitation program, you will be expected to participate in at least 3 hours of rehabilitation therapies Monday-Friday, with modified therapy programming on the weekends.  Your rehabilitation program will include the following services:  Physical Therapy (PT), Occupational Therapy (OT), 24 hour per day rehabilitation nursing, Therapeutic Recreation (TR), Neuropsychology, Case Management (Social Worker), Rehabilitation Medicine, Nutrition Services and Pharmacy Services  Weekly team conferences will be held on Wednesdays to discuss your progress.  Your Social Worker will talk with you frequently to get your input and to update you on team discussions.  Team conferences with you and your family in attendance may also be held.  Expected length of stay: 10 to 14 days  Overall anticipated outcome: Modified Independent, but Supervision for ambulation and tub transfers and Minimal Assistance for stairs  Depending on your progress and recovery, your program may change. Your Social Worker will coordinate services and will keep you informed of any changes. Your Social Worker's name and contact numbers are listed  below.  The following services may also be recommended but are not provided by the [facility]:   Driving Evaluations  Home Health Rehabiltiation Services  Outpatient Rehabilitation Services   Arrangements will be made to provide these services after discharge if needed.  Arrangements include referral to agencies that provide these services.  Your insurance has been verified to be:  Medicaid Your primary doctor is:  Dr. Pete GlatterJohn Laisure  Pertinent  information will be shared with your doctor and your insurance company.  Social Worker:  Staci AcostaJenny Tarry Blayney, LCSW  [phone] or ([phone]) [phone]  Information discussed with and copy given to patient by: Elvera LennoxPrevatt, Shanaya Schneck Capps, 09/12/2013, 12:18 PM

## 2013-09-12 NOTE — Progress Notes (Signed)
Discussed with Marissa NestlePam Love, PA patient talking to people not present in the room and picking at things in the air. Patient complaining of constant pain despite changes in medication regimen today.Patient yelling out and at other times sings , restless in bed,  Patient on scheduled Robaxin 750 mg po four times a day, scheduled morphine 15 mg po time release every 12 hours changed from Oxycontin CR 10 mg every 12 hours,Oxycodone 10 mg po every four hours as needed,  k pad in place to thigh. Patient states " pain never leaves" no new ordres given at this time. Roberts-VonCannon, Gunhild Bautch Elon JesterMichele

## 2013-09-12 NOTE — Progress Notes (Signed)
Occupational Therapy Session Note  Patient Details  Name: [name] LauthRobert [name] MRN: [id number] Date of Birth: [birth year]  Today's Date: 09/12/2013 Time: 0915-0950 Time Calculation (min): 35 min  Short Term Goals: Week 1:  OT Short Term Goal 1 (Week 1): LTG=STG  Skilled Therapeutic Interventions/Progress Updates:    Pt seen for ADL retraining with focus on bed mobility, transfers, and LLE positioning when seated in w/c.  Upon arrival pt reports pain 9/10, premedicated prior to session.  Pt with tangential conversation, confusion, hallucinations, and bouts of "zoning out" during session.  Pt's girlfriend present and reports assisting with transfers and wanting to be checked off to perform transfers independently.  Girlfriend set up w/c and provided verbal cues for sequencing of transfer.  Educated girlfriend on better positioning of her body to provide increased assist as needed, however due to pt's inconsistent level of alertness and engagement in task due to pain and pain meds feel it is not safe for her to perform perform transfers independently - girlfriend reports understanding.  Unable to engage in bathing and dressing due to pt's decreased level of alertness and engagement.  Discussed proper positioning of LLE when seated in w/c.  Pt began to cry out in pain and fear requesting to return to bed.  Min assist stand pivot to bed with constant verbal cues of reassurance.  Girlfriend expressing concerns of pain and confusion due to pain meds.  PA notified.    Therapy Documentation Precautions:  Precautions Precautions: Fall Precaution Comments: Pt very confused, poor attn span, makes him a large fall risk. Restrictions Weight Bearing Restrictions: Yes LLE Weight Bearing: Non weight bearing Other Position/Activity Restrictions: L BKA General: General Amount of Missed OT Time (min): 25 Minutes Vital Signs: Therapy Vitals Temp: 101.4 F (38.6 C) Temp src: Oral Pulse Rate: 94 Resp: 19 BP: 121/75  mmHg Patient Position, if appropriate: Sitting Oxygen Therapy SpO2: 97 % O2 Device: None (Room air) Pain: Pain Assessment Pain Assessment: 0-10 Pain Score: 9  Pain Type: Acute pain;Surgical pain Pain Location: Leg Pain Orientation: Left Pain Descriptors / Indicators: Other (Comment) ("It hurts") Pain Frequency: Constant Pain Onset: On-going Patients Stated Pain Goal: 4 Pain Intervention(s): Medication (See eMAR);Repositioned;Emotional support Multiple Pain Sites: No  See FIM for current functional status  Therapy/Group: Individual Therapy  Rosalio LoudHOXIE, Shalice Woodring 09/12/2013, 10:03 AM

## 2013-09-12 NOTE — IPOC Note (Signed)
Overall Plan of Care [facility](IPOC) Patient Details Name: [name] LauthRobert [name] MRN: [id number] DOB: [birth year]  Admitting Diagnosis: L BKA HX ETOH  Hospital Problems: Active Problems:   Unilateral complete BKA     Functional Problem List: Nursing Behavior;Edema;Endurance;Medication Management;Motor;Nutrition;Pain;Perception;Safety;Sensory;Skin Integrity  PT Balance;Endurance;Motor;Safety  OT Balance;Cognition;Endurance;Motor;Pain;Safety;Sensory  SLP    TR         Basic ADL's: OT Grooming;Bathing;Dressing;Toileting     Advanced  ADL's: OT Simple Meal Preparation     Transfers: PT Bed Mobility;Bed to Chair;Car  OT Toilet;Tub/Shower     Locomotion: PT Stairs;Wheelchair Mobility;Ambulation     Additional Impairments: OT None  SLP        TR      Anticipated Outcomes Item Anticipated Outcome  Self Feeding independent  Swallowing      Basic self-care  mod I  Toileting  nod I   Bathroom Transfers supervision  Bowel/Bladder  N/A  Transfers  mod I for transfers  Locomotion  S for ambulation. min A for stairs, I for w/c mobility  Communication     Cognition     Pain  <3 on  a 0-10 scale  Safety/Judgment  mod A   Therapy Plan: PT Intensity: Minimum of 1-2 x/day ,45 to 90 minutes PT Frequency: 5 out of 7 days PT Duration Estimated Length of Stay: 10-14 days OT Intensity: Minimum of 1-2 x/day, 45 to 90 minutes OT Frequency: 5 out of 7 days OT Duration/Estimated Length of Stay: 10-14 days         Team Interventions: Nursing Interventions Patient/Family Education;Disease Management/Prevention;Pain Management;Medication Management;Skin Care/Wound Management;Cognitive Remediation/Compensation;Discharge Planning;Psychosocial Support  PT interventions Ambulation/gait training;Discharge planning;DME/adaptive equipment instruction;Functional mobility training;Patient/family education;Neuromuscular re-education;Therapeutic Exercise;Therapeutic Activities;UE/LE Coordination  activities;UE/LE Strength taining/ROM;Wheelchair propulsion/positioning;Stair training  OT Interventions Balance/vestibular training;Cognitive remediation/compensation;Discharge planning;DME/adaptive equipment instruction;Functional mobility training;Pain management;Patient/family education;Psychosocial support;Self Care/advanced ADL retraining;Skin care/wound managment;Therapeutic Exercise;Therapeutic Activities;UE/LE Strength taining/ROM;UE/LE Coordination activities;Wheelchair propulsion/positioning  SLP Interventions    TR Interventions    SW/CM Interventions      Team Discharge Planning: Destination: PT-Home ,OT- Home , SLP-  Projected Follow-up: PT-Home health PT, OT-  Home health OT, SLP-  Projected Equipment Needs: PT-To be determined, OT- 3 in 1 bedside comode;Tub/shower bench, SLP-  Equipment Details: PT- , OT-  Patient/family involved in discharge planning: PT- Patient,  OT-Patient, SLP-   MD ELOS: 10-14 Medical Rehab Prognosis:  Good Assessment: 48 yo male with severe PAD, underwent numerous revascularization procedures prior to eventual L BKA.  Now requiring 24/7 Rehab RN,MD, as well as CIR level PT, OT and SLP.  Treatment team will focus on ADLs and mobility with goals set at SUP/Mod I    See Team Conference Notes for weekly updates to the plan of care

## 2013-09-12 NOTE — Progress Notes (Signed)
Occupational Therapy Session Note  Patient Details  Name: [name] LauthRobert Kling MRN: [id number] Date of Birth: Jul 09, 1965  Today's Date: 09/12/2013 Time: [id number] Time Calculation (min): 40 min  Skilled Therapeutic Interventions/Progress Updates:    Pt transferred supine to sit EOB with supervision and then worked on lateral leans side to side to remove current shorts.  He donned new sweatpants with min assist sit to stand.  Pt slightly impulsive and stood prior to therapist being in a safe position and without the walker in front of him.  He was stabilizing his standing using the bed rail on the LUE.  Pt transferred to the wheelchair with min assist and rolled himself to the gym with mod instructional cueing from therapist.  Pt's fiancee accompanied him to therapy.  Pt with increased confusion as well and difficult to stay on topic with questions and conversations.  Worked on sit to stand and standing during session.  Had pt work on retrieving items with both the left and right UEs while maintaining balance.  Pt able to pick up a cup and also checkers alternating UEs with min guard assist.  Pt able to stand for periods of 2 mins before becoming diphoretic and needing to sit.     Therapy Documentation Precautions:  Precautions Precautions: Fall Precaution Comments: Pt very confused, poor attn span, makes him a large fall risk. Restrictions Weight Bearing Restrictions: No LLE Weight Bearing: Non weight bearing Other Position/Activity Restrictions: L BKA  Pain: Pain Assessment Pain Assessment: Faces Faces Pain Scale: Hurts even more Pain Type: Acute pain;Surgical pain Pain Location: Leg Pain Orientation: Left Pain Intervention(s): Repositioned;Emotional support ADL: See FIM for current functional status  Therapy/Group: Individual Therapy  Waynetta Metheny OTR/L 09/12/2013, 3:26 PM

## 2013-09-12 NOTE — Progress Notes (Signed)
Patient information reviewed and entered into eRehab system by Ilianna Bown, RN, CRRN, PPS Coordinator.  Information including medical coding and functional independence measure will be reviewed and updated through discharge.    

## 2013-09-12 NOTE — Progress Notes (Signed)
[facility]  Subjective  - Patient had a temp of 101.4.  Right groin wound was inspected and had an open area.  No purulent drainage, no malodor.  Pamala Love rehabilitation PA cultured the wound this am.   Objective 121/75 94 101.4 F (38.6 C) (Oral) 19 97%  Intake/Output Summary (Last 24 hours) at 09/12/13 1218 Last data filed at 09/12/13 0745  Gross per 24 hour  Intake    240 ml  Output    300 ml  Net    -60 ml   No purulent drainage, no malodor.  Clean edges and no active drainage. Q-tip was used to do wet to dry packing with 2 x 2.  The wound was 2 mm in depth and 5 mm wide.   Assessment/Planning: AORTA BIFEMORAL BYPASS GRAFT; Surgeon: Janetta Horaharles E Fields Left iliac thrombectomy, left femoral endarterectomy, patch angioplasty left femoral artery, right iliac thrombectomy, left to right femoral-femoral bypass, left popliteal and tibial embolectomy, vein patch angioplasty left popliteal artery, intraoperative arteriogram, left leg 4 compartment fasciotomy    Left groin incisional wound wet to dry Will return after therapy to evaluate left LE better.    [name], Cariann Kinnamon [facility]es[facility] 09/12/2013 12:18 PM --  Laboratory Lab Results:  Recent Labs  09/12/13 0543  WBC 10.1  HGB 8.5*  HCT 25.9*  PLT 539*   BMET  Recent Labs  09/12/13 0543  NA 130*  K 4.7  CL 92*  CO2 26  GLUCOSE 194*  BUN 19  CREATININE 1.18  CALCIUM 7.7*    COAG Lab Results  Component Value Date   INR 1.14 09/05/2013   INR 0.99 08/30/2013   INR 1.36 01/12/2012   No results found for this basename: PTT

## 2013-09-12 NOTE — Progress Notes (Signed)
Physical Therapy Session Note  Patient Details  Name: [name] LauthRobert [name] MRN: [id number] Date of Birth: 06-08-1966  Today's Date: 09/12/2013 Time: 1304-1400 Time Calculation (min): 56 min  Short Term Goals: Week 1:  PT Short Term Goal 1 (Week 1): Pt will transfer supine <> edge of bed with S. PT Short Term Goal 2 (Week 1): Pt will transfers w/c <> bed with S.  PT Short Term Goal 3 (Week 1): Pt will ambulate with rolling walker about 50 feet with min A.  PT Short Term Goal 4 (Week 1): Pt will ascend/descend 2 stairs with mod A.  PT Short Term Goal 5 (Week 1): Pt will propel w/c about 150 feet with B UEs and R LE S only.  Skilled Therapeutic Interventions/Progress Updates:   Pt received sitting up in recliner, agreeable to therapy.   Noted to still be somewhat confused, however seems less confused from morning session.  Pt transferred into w/c at min assist level with AD with mod to max verbal cues for sequencing and technique as he attempted to sit before completely at chair.  Pt self propelled x 100' x 2 reps to/from gym with BUEs to increase UE strength/endurance.  Once in therapy gym, performed stand pivot transfer w/c <> mat with RW at min assist with cues for hand placement and safety.  Once on mat, performed supine therex to L residual limb; SLR x 10 reps, knee presses x 10 reps, SL hip abd x 10 reps, and SL hip ext x 10 reps.  Pt continues to be easily distracted and requires mod cues to return to task during session.  Pt performed bed mobility at min assist with light assist for L residual limb.  Performed 30' gait training x 2 reps with RW at min assist.  Does very well with step to technique, requiring on min cues when turning for safety and hand placement with BUE to arm rests when sitting.  Provided education on desensitization during session with having pt lightly rub end of residual limb, however will likely have to educate girlfriend for increased carryover.  Pt returned to room as stated  above and transferred back to bed via stand pivot with RW as stated above.  Pt left in bed with bed alarm set and 3 bed rails in place.  All needs in reach and girlfriend present.    Therapy Documentation Precautions:  Precautions Precautions: Fall Precaution Comments: Pt very confused, poor attn span, makes him a large fall risk. Restrictions Weight Bearing Restrictions: No LLE Weight Bearing: Non weight bearing Other Position/Activity Restrictions: L BKA    Pain: Pain Assessment Pain Assessment: Faces Faces Pain Scale: Hurts whole lot Pain Type: Acute pain;Surgical pain Pain Location: Leg Pain Orientation: Left Pain Descriptors / Indicators: Other (Comment) (Yelling "it hurts") Pain Frequency: Constant Pain Onset: On-going Patients Stated Pain Goal: 5 Pain Intervention(s): Medication (See eMAR);Repositioned;Emotional support Multiple Pain Sites: No  Ambulation Ambulation/Gait Assistance: 4: Min assist Wheelchair Mobility Distance: 150   See FIM for current functional status  Therapy/Group: Individual Therapy  [name], Rhianne Soman Ann 09/12/2013, 4:05 PM

## 2013-09-12 NOTE — Progress Notes (Signed)
48 y.o. male with h/o HTN, DM, tobacco abuse, ASPVD s/p L-CEA and aortobifemoral BG; who was admitted on 08/30/13 with cold, pulseless, painful left foot. CTA with occluded ABF and patient underwent bilateral iliac thrombectomy, left femoral endarterectomy with left to right femoral-femoral bypass, left popliteal and tibial embolectomy and LLE four compartment fasciotomy by Dr. Darrick Penna on 08/31/13. Post op with ABLA requiring transfusion, continued pain as well as confusion (due to PCA?). 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 Darrick Penna  Subjective/Complaints: L stump pain no phantom pain  Objective: Vital Signs: Blood pressure 121/75, pulse 94, temperature 101.4 F (38.6 C), temperature source Oral, resp. rate 19, height 6\' 2"  (1.88 m), weight 101.7 kg (224 lb 3.3 oz), SpO2 97.00%. No results found. Results for orders placed during the hospital encounter of 09/09/13 (from the past 72 hour(s))  URINALYSIS, ROUTINE W REFLEX MICROSCOPIC     Status: Abnormal   Collection Time    09/09/13  3:23 PM      Result Value Ref Range   Color, Urine YELLOW  YELLOW   APPearance CLEAR  CLEAR   Specific Gravity, Urine 1.015  1.005 - 1.030   pH 7.0  5.0 - 8.0   Glucose, UA NEGATIVE  NEGATIVE mg/dL   Hgb urine dipstick NEGATIVE  NEGATIVE   Bilirubin Urine NEGATIVE  NEGATIVE   Ketones, ur NEGATIVE  NEGATIVE mg/dL   Protein, ur NEGATIVE  NEGATIVE mg/dL   Urobilinogen, UA 4.0 (*) 0.0 - 1.0 mg/dL   Nitrite NEGATIVE  NEGATIVE   Leukocytes, UA NEGATIVE  NEGATIVE   Comment: MICROSCOPIC NOT DONE ON URINES WITH NEGATIVE PROTEIN, BLOOD, LEUKOCYTES, NITRITE, OR GLUCOSE <1000 mg/dL.  URINE CULTURE     Status: None   Collection Time    09/09/13  3:23 PM      Result Value Ref Range   Specimen Description URINE, CLEAN CATCH     Special Requests PT ON KEFLEX     Culture  Setup Time       Value: 09/09/2013 16:20     Performed at [facility] Count       Value: NO GROWTH     Performed at  [facility]   Culture       Value: NO GROWTH     Performed at [facility]   Report Status 09/10/2013 FINAL    GLUCOSE, CAPILLARY     Status: Abnormal   Collection Time    09/09/13  4:42 PM      Result Value Ref Range   Glucose-Capillary 157 (*) 70 - 99 mg/dL   Comment 1 Notify RN    GLUCOSE, CAPILLARY     Status: Abnormal   Collection Time    09/09/13  9:51 PM      Result Value Ref Range   Glucose-Capillary 192 (*) 70 - 99 mg/dL  GLUCOSE, CAPILLARY     Status: Abnormal   Collection Time    09/10/13  7:43 AM      Result Value Ref Range   Glucose-Capillary 146 (*) 70 - 99 mg/dL   Comment 1 Notify RN    GLUCOSE, CAPILLARY     Status: Abnormal   Collection Time    09/10/13 12:25 PM      Result Value Ref Range   Glucose-Capillary 161 (*) 70 - 99 mg/dL   Comment 1 Notify RN    GLUCOSE, CAPILLARY     Status: Abnormal   Collection Time  09/10/13  5:18 PM      Result Value Ref Range   Glucose-Capillary 128 (*) 70 - 99 mg/dL   Comment 1 Notify RN    GLUCOSE, CAPILLARY     Status: Abnormal   Collection Time    09/10/13  8:25 PM      Result Value Ref Range   Glucose-Capillary 124 (*) 70 - 99 mg/dL   Comment 1 Notify RN    GLUCOSE, CAPILLARY     Status: Abnormal   Collection Time    09/11/13  7:42 AM      Result Value Ref Range   Glucose-Capillary 165 (*) 70 - 99 mg/dL  GLUCOSE, CAPILLARY     Status: Abnormal   Collection Time    09/11/13 11:24 AM      Result Value Ref Range   Glucose-Capillary 187 (*) 70 - 99 mg/dL  GLUCOSE, CAPILLARY     Status: Abnormal   Collection Time    09/11/13  4:47 PM      Result Value Ref Range   Glucose-Capillary 166 (*) 70 - 99 mg/dL  GLUCOSE, CAPILLARY     Status: Abnormal   Collection Time    09/11/13  8:48 PM      Result Value Ref Range   Glucose-Capillary 165 (*) 70 - 99 mg/dL  CBC WITH DIFFERENTIAL     Status: Abnormal   Collection Time    09/12/13  5:43 AM      Result Value Ref Range   WBC 10.1  4.0 - 10.5 K/uL    RBC 3.09 (*) 4.22 - 5.81 MIL/uL   Hemoglobin 8.5 (*) 13.0 - 17.0 g/dL   HCT 16.125.9 (*) 09.639.0 - 04.552.0 %   MCV 83.8  78.0 - 100.0 fL   MCH 27.5  26.0 - 34.0 pg   MCHC 32.8  30.0 - 36.0 g/dL   RDW 40.914.9  81.111.5 - 91.415.5 %   Platelets 539 (*) 150 - 400 K/uL   Neutrophils Relative % 74  43 - 77 %   Neutro Abs 7.5  1.7 - 7.7 K/uL   Lymphocytes Relative 17  12 - 46 %   Lymphs Abs 1.7  0.7 - 4.0 K/uL   Monocytes Relative 6  3 - 12 %   Monocytes Absolute 0.6  0.1 - 1.0 K/uL   Eosinophils Relative 2  0 - 5 %   Eosinophils Absolute 0.2  0.0 - 0.7 K/uL   Basophils Relative 0  0 - 1 %   Basophils Absolute 0.0  0.0 - 0.1 K/uL     HEENT: normal and poor dentition Cardio: RRR and no murmur Resp: CTA B/L GI: BS positive and non distended Extremity:  Left stump edema, min serosang drainage Skin:   Wound staples intact, some maceration Lat aspect Neuro: Alert/Oriented, Anxious and Abnormal Motor 3-/ LHF, will not flex/ext knee on L due to pain Musc/Skel:  Other upper ext and RLE full ROM without pain Gen anxious c/o of severe stump pain   Assessment/Plan: 1. Functional deficits secondary to Left BKA secondary to PAD which require 3+ hours per day of interdisciplinary therapy in a comprehensive inpatient rehab setting. Physiatrist is providing close team supervision and 24 hour management of active medical problems listed below. Physiatrist and rehab team continue to assess barriers to discharge/monitor patient progress toward functional and medical goals. FIM: FIM - Bathing Bathing Steps Patient Completed: Chest;Right Arm;Left Arm;Abdomen Bathing: 2: Max-Patient completes 3-4 2326f 10 parts or 25-49%  FIM -  Upper Body Dressing/Undressing Upper body dressing/undressing steps patient completed: Thread/unthread left sleeve of pullover shirt/dress;Put head through opening of pull over shirt/dress;Pull shirt over trunk;Thread/unthread right sleeve of pullover shirt/dresss Upper body dressing/undressing: 4:  Min-Patient completed 75 plus % of tasks FIM - Lower Body Dressing/Undressing Lower body dressing/undressing: 0: Activity did not occur  FIM - Toileting Toileting: 0: Activity did not occur  FIM - Diplomatic Services operational officer Devices: Grab bars Toilet Transfers: 3-To toilet/BSC: Mod A (lift or lower assist);3-From toilet/BSC: Mod A (lift or lower assist)  FIM - Bed/Chair Transfer Bed/Chair Transfer Assistive Devices: Bed rails;Arm rests Bed/Chair Transfer: 5: Supine > Sit: Supervision (verbal cues/safety issues);4: Sit > Supine: Min A (steadying pt. > 75%/lift 1 leg);3: Bed > Chair or W/C: Mod A (lift or lower assist)  FIM - Locomotion: Wheelchair Distance: 5 feet Locomotion: Wheelchair: 1: Travels less than 50 ft with supervision, cueing or coaxing FIM - Locomotion: Ambulation Locomotion: Ambulation Assistive Devices: Designer, industrial/product Ambulation/Gait Assistance: 4: Min assist Locomotion: Ambulation: 2: Travels 50 - 149 ft with minimal assistance (Pt.>75%)  Comprehension Comprehension Mode: Auditory Comprehension: 4-Understands basic 75 - 89% of the time/requires cueing 10 - 24% of the time  Expression Expression Mode: Verbal Expression: 5-Expresses basic needs/ideas: With extra time/assistive device  Social Interaction Social Interaction: 6-Interacts appropriately with others with medication or extra time (anti-anxiety, antidepressant).  Problem Solving Problem Solving: 5-Solves basic 90% of the time/requires cueing < 10% of the time  Memory Memory: 5-Recognizes or recalls 90% of the time/requires cueing < 10% of the time   Medical Problem List and Plan:  1. DVT Prophylaxis/Anticoagulation: Pharmaceutical: Lovenox  2. Pain Management: continue oxycodone prn. Resume neurontin for neuropathy.  3. Anxiety disorder/Mood: continues to be anxious and disoriented.adjust pain meds LCSW to follow for evaluation and support. Continue Xanax daily and will continue  xanax to 0.5 mg bid prn.  4. Neuropsych: This patient is not capable of making decisions on his own behalf.  5. HTN: Monitor with bid checks. Continue atenolol and lisinopril  6. DM type 2: Monitor BS with ac/hs checks. Discontinue glipizide and resume metformin. And slowly resume 70/30 insulin as indicated. Use SSI for elevated BS.  7. ABLA: will recheck on Monday. Add iron supplement.  8. Hypokalemia: Likely dilutional. D/C IVF and supplement.   LOS (Days) 3 A FACE TO FACE EVALUATION WAS PERFORMED  KIRSTEINS,ANDREW E 09/12/2013, 6:35 AM

## 2013-09-12 NOTE — Progress Notes (Addendum)
Patient and girlfriend with multiple questions regarding pain management, wound care and fever. Right groin wound (tract cultured). Left groin with 1 cm dehiscence and white eschar. Right groin wound with tracking at inferior aspect--1 cm depth and dehiscence at proximal aspect without eschar. Bilateral groin incision cleansed with betadine, and covered with damp to dry dressing.  VVS contacted for check of wound and any other instructions.  Educated patient and GF on desensitizing L-BKA.

## 2013-09-13 ENCOUNTER — Inpatient Hospital Stay (HOSPITAL_COMMUNITY): Payer: Medicaid Other | Admitting: Physical Therapy

## 2013-09-13 ENCOUNTER — Inpatient Hospital Stay (HOSPITAL_COMMUNITY): Payer: Medicaid Other | Admitting: Occupational Therapy

## 2013-09-13 DIAGNOSIS — S88119A Complete traumatic amputation at level between knee and ankle, unspecified lower leg, initial encounter: Secondary | ICD-10-CM

## 2013-09-13 DIAGNOSIS — E119 Type 2 diabetes mellitus without complications: Secondary | ICD-10-CM

## 2013-09-13 DIAGNOSIS — L98499 Non-pressure chronic ulcer of skin of other sites with unspecified severity: Secondary | ICD-10-CM

## 2013-09-13 DIAGNOSIS — I10 Essential (primary) hypertension: Secondary | ICD-10-CM

## 2013-09-13 DIAGNOSIS — D62 Acute posthemorrhagic anemia: Secondary | ICD-10-CM

## 2013-09-13 DIAGNOSIS — I739 Peripheral vascular disease, unspecified: Secondary | ICD-10-CM

## 2013-09-13 LAB — CBC WITH DIFFERENTIAL/PLATELET
BASOS ABS: 0 10*3/uL (ref 0.0–0.1)
Basophils Relative: 0 % (ref 0–1)
EOS PCT: 2 % (ref 0–5)
Eosinophils Absolute: 0.2 10*3/uL (ref 0.0–0.7)
HEMATOCRIT: 24.9 % — AB (ref 39.0–52.0)
Hemoglobin: 8.3 g/dL — ABNORMAL LOW (ref 13.0–17.0)
Lymphocytes Relative: 18 % (ref 12–46)
Lymphs Abs: 1.9 10*3/uL (ref 0.7–4.0)
MCH: 27.6 pg (ref 26.0–34.0)
MCHC: 33.3 g/dL (ref 30.0–36.0)
MCV: 82.7 fL (ref 78.0–100.0)
MONO ABS: 0.9 10*3/uL (ref 0.1–1.0)
Monocytes Relative: 9 % (ref 3–12)
NEUTROS ABS: 7.1 10*3/uL (ref 1.7–7.7)
Neutrophils Relative %: 70 % (ref 43–77)
Platelets: 507 10*3/uL — ABNORMAL HIGH (ref 150–400)
RBC: 3.01 MIL/uL — ABNORMAL LOW (ref 4.22–5.81)
RDW: 15.1 % (ref 11.5–15.5)
WBC: 10.1 10*3/uL (ref 4.0–10.5)

## 2013-09-13 LAB — GLUCOSE, CAPILLARY
GLUCOSE-CAPILLARY: 161 mg/dL — AB (ref 70–99)
GLUCOSE-CAPILLARY: 158 mg/dL — AB (ref 70–99)
Glucose-Capillary: 167 mg/dL — ABNORMAL HIGH (ref 70–99)
Glucose-Capillary: 174 mg/dL — ABNORMAL HIGH (ref 70–99)

## 2013-09-13 MED ORDER — CHLORDIAZEPOXIDE HCL 25 MG PO CAPS
25.0000 mg | ORAL_CAPSULE | Freq: Four times a day (QID) | ORAL | Status: DC | PRN
  Administered 2013-09-13: 25 mg via ORAL
  Filled 2013-09-13: qty 1

## 2013-09-13 MED ORDER — SERTRALINE HCL 50 MG PO TABS
50.0000 mg | ORAL_TABLET | Freq: Every day | ORAL | Status: DC
  Administered 2013-09-13 – 2013-09-14 (×2): 50 mg via ORAL
  Filled 2013-09-13 (×4): qty 1

## 2013-09-13 NOTE — Progress Notes (Signed)
[facility]  Subjective  - Left leg hurts   Objective 116/72 98 98.5 F (36.9 C) (Oral) 17 93%  Intake/Output Summary (Last 24 hours) at 09/13/13 1128 Last data filed at 09/13/13 0745  Gross per 24 hour  Intake    720 ml  Output    300 ml  Net    420 ml   Bilateral groins slightly macerated, no significant drainage 2+ DP pulse right foot Left BKA with some ischemic looking skin edges laterally no obvious abscess  Assessment/Planning: Slowly healing groin incisions,  Hydrogel once daily Dry dressing left BKA will probably need some debridement down the road continue once daily dressing Will follow every few days  [name] [name],[name] [name] 09/13/2013 11:28 AM --  Laboratory Lab Results:  Recent Labs  09/12/13 0543 09/13/13 0455  WBC 10.1 10.1  HGB 8.5* 8.3*  HCT 25.9* 24.9*  PLT 539* 507*   BMET  Recent Labs  09/12/13 0543  NA 130*  K 4.7  CL 92*  CO2 26  GLUCOSE 194*  BUN 19  CREATININE 1.18  CALCIUM 7.7*    COAG Lab Results  Component Value Date   INR 1.14 09/05/2013   INR 0.99 08/30/2013   INR 1.36 01/12/2012   No results found for this basename: PTT

## 2013-09-13 NOTE — Progress Notes (Signed)
Physical Therapy Note  Patient Details  Name: Leida LauthRobert Guyett MRN: [id number] Date of Birth: 11-Feb-1966 Today's Date: 09/13/2013  Time 1: 830-925 55 minutes  1:1 Pt c/o 10/10 pain in residual limb throughout session, meds rec'd, reposition and rest as appropriate.  Pt initially requires encouragement to participate, then agreeable.  Sit to stands for dressing with min A.  Stand pivot transfers with max cuing for safe technique, min A for balance.  W/c mobility in controlled and home environments with supervision, cuing for negotiating tight spaces.  Gait with RW 3 x 25' with min A for balance.  Toilet transfers with min A, cues for safety.  Pt limited throughout session by perseveration on topics not dealing with therapy.  Pt with impaired sustained attention to task < 1 minute.  Perseveration on pain.   Time 2: 1345-1415 30 minutes  1:1 Pt no c/o pain.  Pt refused OOB activity despite encouragement, agreeable to therex in bed.  Supine 2 x 10 quad set, SLR, hip abd/add, add squeeze, SAQ.  Pt then asked to use restroom.  Transfers bed <> chair <> toilet with supervision with cues for safety.  Pt unwilling to participate in further therapy due to fatigue.  Terron Merfeld 09/13/2013, 9:25 AM

## 2013-09-13 NOTE — Progress Notes (Signed)
Occupational Therapy Note  Patient Details  Name: Leida LauthRobert Cullifer MRN: [id number] Date of Birth: February 19, 1966 Today's Date: 09/13/2013  Pt missed 30 mins skilled OT treatment session secondary to pain.  Pt reports having just returned to bed and requesting to rest at this time.  Pt with decreased receptiveness to encouragement to participate with tearfulness due to pain and anxiety. Will follow up as able.  Rosalio LoudHOXIE, Thai Burgueno 09/13/2013, 12:11 PM

## 2013-09-13 NOTE — Progress Notes (Signed)
Occupational Therapy Session Note  Patient Details  Name: [name] LauthRobert [name] MRN: [id number] Date of Birth: 02/16/1966  Today's Date: 09/13/2013 Time: 0930-1030 Time Calculation (min): 60 min  Short Term Goals: Week 1:  OT Short Term Goal 1 (Week 1): LTG=STG  Skilled Therapeutic Interventions/Progress Updates:    Engaged in therapeutic activity with focus on increased participation in treatment session.  Initially required encouragement to participate in treatment session with pt reporting severe pain and fatigue, but redirectable.  Stand pivot transfer bed > w/c with min assist with max cues for safety.  Pt reports unwilling to don shorts or pants over underwear, stating "stop nagging".  Discussion regarding Lt residual limb care and knee extension at all times with pt with decreased carryover.  W/c mobility > 150 feet to family room, where pt stood to fix coffee with min/steady assist in standing.  Pt limited throughout session by perseveration on topics not dealing with therapy and spontaneous outbursts of singing.  Pt with decreased sustained attention to task requiring mod-max cues to redirect with pt becoming increasingly frustrated with therapist.  Performed stand pivot transfer w/c to toilet with min assist, supervision for toileting hygiene and clothing management and stand pivot back to w/c.   Therapy Documentation Precautions:  Precautions Precautions: Fall Precaution Comments: Pt very confused, poor attn span, makes him a large fall risk. Restrictions Weight Bearing Restrictions: Yes LLE Weight Bearing: Non weight bearing Other Position/Activity Restrictions: L BKA General:   Vital Signs: Therapy Vitals Pulse Rate: 98 BP: 116/72 mmHg Pain:  Pt perseverating on pain, unable to rate.  See FIM for current functional status  Therapy/Group: Individual Therapy  Rosalio LoudHOXIE, [name] 09/13/2013, 12:02 PM
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
## 2013-09-13 NOTE — Progress Notes (Signed)
48 y.o. male with h/o HTN, DM, tobacco abuse, ASPVD s/p L-CEA and aortobifemoral BG; who was admitted on 08/30/13 with cold, pulseless, painful left foot. CTA with occluded ABF and patient underwent bilateral iliac thrombectomy, left femoral endarterectomy with left to right femoral-femoral bypass, left popliteal and tibial embolectomy and LLE four compartment fasciotomy by Dr. Oneida Alar on 08/31/13. Post op with ABLA requiring transfusion, continued pain as well as confusion (due to PCA?). 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 Oneida Alar  Subjective/Complaints: L stump pain overall improved did all therapy except am ADL session yest, appreciate vasc surgery note  Objective: Vital Signs: Blood pressure 97/66, pulse 92, temperature 98.5 F (36.9 C), temperature source Oral, resp. rate 17, height 6' 2"  (1.88 m), weight 101.7 kg (224 lb 3.3 oz), SpO2 93.00%. No results found. Results for orders placed during the hospital encounter of 09/09/13 (from the past 72 hour(s))  GLUCOSE, CAPILLARY     Status: Abnormal   Collection Time    09/10/13  7:43 AM      Result Value Ref Range   Glucose-Capillary 146 (*) 70 - 99 mg/dL   Comment 1 Notify RN    GLUCOSE, CAPILLARY     Status: Abnormal   Collection Time    09/10/13 12:25 PM      Result Value Ref Range   Glucose-Capillary 161 (*) 70 - 99 mg/dL   Comment 1 Notify RN    GLUCOSE, CAPILLARY     Status: Abnormal   Collection Time    09/10/13  5:18 PM      Result Value Ref Range   Glucose-Capillary 128 (*) 70 - 99 mg/dL   Comment 1 Notify RN    GLUCOSE, CAPILLARY     Status: Abnormal   Collection Time    09/10/13  8:25 PM      Result Value Ref Range   Glucose-Capillary 124 (*) 70 - 99 mg/dL   Comment 1 Notify RN    GLUCOSE, CAPILLARY     Status: Abnormal   Collection Time    09/11/13  7:42 AM      Result Value Ref Range   Glucose-Capillary 165 (*) 70 - 99 mg/dL  GLUCOSE, CAPILLARY     Status: Abnormal   Collection Time    09/11/13  11:24 AM      Result Value Ref Range   Glucose-Capillary 187 (*) 70 - 99 mg/dL  GLUCOSE, CAPILLARY     Status: Abnormal   Collection Time    09/11/13  4:47 PM      Result Value Ref Range   Glucose-Capillary 166 (*) 70 - 99 mg/dL  GLUCOSE, CAPILLARY     Status: Abnormal   Collection Time    09/11/13  8:48 PM      Result Value Ref Range   Glucose-Capillary 165 (*) 70 - 99 mg/dL  CBC WITH DIFFERENTIAL     Status: Abnormal   Collection Time    09/12/13  5:43 AM      Result Value Ref Range   WBC 10.1  4.0 - 10.5 K/uL   RBC 3.09 (*) 4.22 - 5.81 MIL/uL   Hemoglobin 8.5 (*) 13.0 - 17.0 g/dL   HCT 25.9 (*) 39.0 - 52.0 %   MCV 83.8  78.0 - 100.0 fL   MCH 27.5  26.0 - 34.0 pg   MCHC 32.8  30.0 - 36.0 g/dL   RDW 14.9  11.5 - 15.5 %   Platelets 539 (*) 150 -  400 K/uL   Neutrophils Relative % 74  43 - 77 %   Neutro Abs 7.5  1.7 - 7.7 K/uL   Lymphocytes Relative 17  12 - 46 %   Lymphs Abs 1.7  0.7 - 4.0 K/uL   Monocytes Relative 6  3 - 12 %   Monocytes Absolute 0.6  0.1 - 1.0 K/uL   Eosinophils Relative 2  0 - 5 %   Eosinophils Absolute 0.2  0.0 - 0.7 K/uL   Basophils Relative 0  0 - 1 %   Basophils Absolute 0.0  0.0 - 0.1 K/uL  COMPREHENSIVE METABOLIC PANEL     Status: Abnormal   Collection Time    09/12/13  5:43 AM      Result Value Ref Range   Sodium 130 (*) 137 - 147 mEq/L   Potassium 4.7  3.7 - 5.3 mEq/L   Chloride 92 (*) 96 - 112 mEq/L   CO2 26  19 - 32 mEq/L   Glucose, Bld 194 (*) 70 - 99 mg/dL   BUN 19  6 - 23 mg/dL   Creatinine, Ser 1.18  0.50 - 1.35 mg/dL   Calcium 7.7 (*) 8.4 - 10.5 mg/dL   Total Protein 7.6  6.0 - 8.3 g/dL   Albumin 2.4 (*) 3.5 - 5.2 g/dL   AST 115 (*) 0 - 37 U/L   ALT 120 (*) 0 - 53 U/L   Alkaline Phosphatase 96  39 - 117 U/L   Total Bilirubin 0.3  0.3 - 1.2 mg/dL   GFR calc non Af Amer 72 (*) >90 mL/min   GFR calc Af Amer 83 (*) >90 mL/min   Comment: (NOTE)     The eGFR has been calculated using the CKD EPI equation.     This calculation has  not been validated in all clinical situations.     eGFR's persistently <90 mL/min signify possible Chronic Kidney     Disease.  GLUCOSE, CAPILLARY     Status: Abnormal   Collection Time    09/12/13  7:14 AM      Result Value Ref Range   Glucose-Capillary 170 (*) 70 - 99 mg/dL   Comment 1 Notify RN    GLUCOSE, CAPILLARY     Status: Abnormal   Collection Time    09/12/13 11:12 AM      Result Value Ref Range   Glucose-Capillary 120 (*) 70 - 99 mg/dL   Comment 1 Notify RN    GLUCOSE, CAPILLARY     Status: Abnormal   Collection Time    09/12/13  4:49 PM      Result Value Ref Range   Glucose-Capillary 136 (*) 70 - 99 mg/dL  GLUCOSE, CAPILLARY     Status: Abnormal   Collection Time    09/12/13  9:48 PM      Result Value Ref Range   Glucose-Capillary 130 (*) 70 - 99 mg/dL  CBC WITH DIFFERENTIAL     Status: Abnormal   Collection Time    09/13/13  4:55 AM      Result Value Ref Range   WBC 10.1  4.0 - 10.5 K/uL   RBC 3.01 (*) 4.22 - 5.81 MIL/uL   Hemoglobin 8.3 (*) 13.0 - 17.0 g/dL   HCT 24.9 (*) 39.0 - 52.0 %   MCV 82.7  78.0 - 100.0 fL   MCH 27.6  26.0 - 34.0 pg   MCHC 33.3  30.0 - 36.0 g/dL   RDW 15.1  11.5 - 15.5 %   Platelets 507 (*) 150 - 400 K/uL   Neutrophils Relative % 70  43 - 77 %   Neutro Abs 7.1  1.7 - 7.7 K/uL   Lymphocytes Relative 18  12 - 46 %   Lymphs Abs 1.9  0.7 - 4.0 K/uL   Monocytes Relative 9  3 - 12 %   Monocytes Absolute 0.9  0.1 - 1.0 K/uL   Eosinophils Relative 2  0 - 5 %   Eosinophils Absolute 0.2  0.0 - 0.7 K/uL   Basophils Relative 0  0 - 1 %   Basophils Absolute 0.0  0.0 - 0.1 K/uL     HEENT: normal and poor dentition Cardio: RRR and no murmur Resp: CTA B/L GI: BS positive and non distended Extremity:  Left stump edema, min serosang drainage Skin:   Wound staples intact, some maceration Lat aspect Neuro: Alert/Oriented, Anxious and Abnormal Motor 3-/ LHF, will not flex/ext knee on L due to pain Musc/Skel:  Other upper ext and RLE full ROM  without pain Gen anxious c/o of severe stump pain   Assessment/Plan: 1. Functional deficits secondary to Left BKA secondary to PAD which require 3+ hours per day of interdisciplinary therapy in a comprehensive inpatient rehab setting. Physiatrist is providing close team supervision and 24 hour management of active medical problems listed below. Physiatrist and rehab team continue to assess barriers to discharge/monitor patient progress toward functional and medical goals. FIM: FIM - Bathing Bathing Steps Patient Completed: Chest;Right Arm;Left Arm;Abdomen Bathing: 2: Max-Patient completes 3-4 [id number] parts or 25-49%  FIM - Upper Body Dressing/Undressing Upper body dressing/undressing steps patient completed: Thread/unthread left sleeve of pullover shirt/dress;Put head through opening of pull over shirt/dress;Pull shirt over trunk;Thread/unthread right sleeve of pullover shirt/dresss Upper body dressing/undressing: 4: Min-Patient completed 75 plus % of tasks FIM - Lower Body Dressing/Undressing Lower body dressing/undressing: 0: Activity did not occur  FIM - Toileting Toileting: 0: Activity did not occur  FIM - TRadio producerDevices: Grab bars Toilet Transfers: 3-To toilet/BSC: Mod A (lift or lower assist);3-From toilet/BSC: Mod A (lift or lower assist)  FIM - Bed/Chair Transfer Bed/Chair Transfer Assistive Devices: Bed rails;Arm rests;HOB elevated Bed/Chair Transfer: 4: Supine > Sit: Min A (steadying Pt. > 75%/lift 1 leg);4: Bed > Chair or W/C: Min A (steadying Pt. > 75%);4: Chair or W/C > Bed: Min A (steadying Pt. > 75%)  FIM - Locomotion: Wheelchair Distance: 150 Locomotion: Wheelchair: 2: Travels 50 - 149 ft with supervision, cueing or coaxing FIM - Locomotion: Ambulation Locomotion: Ambulation Assistive Devices: WAdministratorAmbulation/Gait Assistance: 4: Min assist Locomotion: Ambulation: 1: Travels less than 50 ft with minimal assistance  (Pt.>75%)  Comprehension Comprehension Mode: Auditory Comprehension: 4-Understands basic 75 - 89% of the time/requires cueing 10 - 24% of the time  Expression Expression Mode: Verbal Expression: 5-Expresses basic needs/ideas: With extra time/assistive device  Social Interaction Social Interaction: 6-Interacts appropriately with others with medication or extra time (anti-anxiety, antidepressant).  Problem Solving Problem Solving: 5-Solves basic 90% of the time/requires cueing < 10% of the time  Memory Memory: 5-Recognizes or recalls 90% of the time/requires cueing < 10% of the time   Medical Problem List and Plan:  1. DVT Prophylaxis/Anticoagulation: Pharmaceutical: Lovenox  2. Pain Management: continue oxycodone prn. Resume neurontin for neuropathy.  3. Anxiety disorder/Mood: continues to be anxious and disoriented.adjust pain meds LCSW to follow for evaluation and support. per fiancee drank about 4oz of alcohol per  day (much last than >1/5 per day prior to CVA, also took 41m xanax twice a day for panic attacks.  Saw Mental health and was diagnosed with bipolar D/O 4. Neuropsych: This patient is not capable of making decisions on his own behalf.  5. HTN: Monitor with bid checks. Continue atenolol and lisinopril  6. DM type 2: Monitor BS with ac/hs checks. Discontinue glipizide and resume metformin. And slowly resume 70/30 insulin as indicated. Use SSI for elevated BS.  7. ABLA: will recheck on Monday. Add iron supplement.  8. Hypokalemia: Likely dilutional. D/C IVF and supplement.  9.  EtOH abuse, protocol 10. 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

## 2013-09-14 ENCOUNTER — Inpatient Hospital Stay (HOSPITAL_COMMUNITY): Payer: Medicaid Other | Admitting: Occupational Therapy

## 2013-09-14 ENCOUNTER — Inpatient Hospital Stay (HOSPITAL_COMMUNITY): Payer: Medicaid Other | Admitting: Rehabilitation

## 2013-09-14 ENCOUNTER — Inpatient Hospital Stay (HOSPITAL_COMMUNITY): Payer: Medicaid Other

## 2013-09-14 DIAGNOSIS — F09 Unspecified mental disorder due to known physiological condition: Secondary | ICD-10-CM

## 2013-09-14 DIAGNOSIS — F332 Major depressive disorder, recurrent severe without psychotic features: Secondary | ICD-10-CM

## 2013-09-14 LAB — BASIC METABOLIC PANEL
BUN: 16 mg/dL (ref 6–23)
CO2: 25 mEq/L (ref 19–32)
Calcium: 8.3 mg/dL — ABNORMAL LOW (ref 8.4–10.5)
Chloride: 94 mEq/L — ABNORMAL LOW (ref 96–112)
Creatinine, Ser: 1.03 mg/dL (ref 0.50–1.35)
GFR calc Af Amer: >90 mL/min (ref >90)
GFR calc non Af Amer: 85 mL/min — ABNORMAL LOW (ref >90)
GLUCOSE: 136 mg/dL — AB (ref 70–99)
POTASSIUM: 4.7 meq/L (ref 3.7–5.3)
SODIUM: 134 meq/L — AB (ref 137–147)

## 2013-09-14 LAB — WOUND CULTURE

## 2013-09-14 LAB — GLUCOSE, CAPILLARY
GLUCOSE-CAPILLARY: 140 mg/dL — AB (ref 70–99)
GLUCOSE-CAPILLARY: 140 mg/dL — AB (ref 70–99)
GLUCOSE-CAPILLARY: 137 mg/dL — AB (ref 70–99)
Glucose-Capillary: 156 mg/dL — ABNORMAL HIGH (ref 70–99)

## 2013-09-14 MED ORDER — QUETIAPINE FUMARATE 25 MG PO TABS
25.0000 mg | ORAL_TABLET | Freq: Every day | ORAL | Status: DC
  Filled 2013-09-14: qty 1

## 2013-09-14 MED ORDER — VITAMIN B-1 100 MG PO TABS
100.0000 mg | ORAL_TABLET | Freq: Every day | ORAL | Status: DC
  Administered 2013-09-14 – 2013-09-19 (×6): 100 mg via ORAL
  Filled 2013-09-14 (×7): qty 1

## 2013-09-14 MED ORDER — QUETIAPINE FUMARATE 100 MG PO TABS
100.0000 mg | ORAL_TABLET | Freq: Every day | ORAL | Status: DC
  Administered 2013-09-14: 100 mg via ORAL
  Filled 2013-09-14 (×2): qty 1

## 2013-09-14 MED ORDER — CLONAZEPAM 0.5 MG PO TABS
1.0000 mg | ORAL_TABLET | Freq: Two times a day (BID) | ORAL | Status: DC
  Administered 2013-09-14 – 2013-09-19 (×9): 1 mg via ORAL
  Filled 2013-09-14 (×9): qty 2

## 2013-09-14 MED ORDER — FLUOXETINE HCL 20 MG PO CAPS
20.0000 mg | ORAL_CAPSULE | Freq: Every day | ORAL | Status: DC
Start: 2013-09-14 — End: 2013-09-19
  Administered 2013-09-14 – 2013-09-19 (×6): 20 mg via ORAL
  Filled 2013-09-14 (×7): qty 1

## 2013-09-14 MED ORDER — GABAPENTIN 600 MG PO TABS
600.0000 mg | ORAL_TABLET | Freq: Three times a day (TID) | ORAL | Status: DC
  Administered 2013-09-14 – 2013-09-15 (×4): 600 mg via ORAL
  Filled 2013-09-14 (×8): qty 1

## 2013-09-14 NOTE — Progress Notes (Signed)
48 y.o. male with h/o HTN, DM, tobacco abuse, ASPVD s/p L-CEA and aortobifemoral BG; who was admitted on 08/30/13 with cold, pulseless, painful left foot. CTA with occluded ABF and patient underwent bilateral iliac thrombectomy, left femoral endarterectomy with left to right femoral-femoral bypass, left popliteal and tibial embolectomy and LLE four compartment fasciotomy by Dr. Oneida Alar on 08/31/13. Post op with ABLA requiring transfusion, continued pain as well as confusion (due to PCA?). 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 Oneida Alar  Subjective/Complaints: Insisting upon transferring independantly, diifficult to redirect  Objective: Vital Signs: Blood pressure 116/72, pulse 92, temperature 98.7 F (37.1 C), temperature source Oral, resp. rate 18, height 6' 2"  (1.88 m), weight 101.7 kg (224 lb 3.3 oz), SpO2 97.00%. No results found. Results for orders placed during the hospital encounter of 09/09/13 (from the past 72 hour(s))  GLUCOSE, CAPILLARY     Status: Abnormal   Collection Time    09/11/13  7:42 AM      Result Value Ref Range   Glucose-Capillary 165 (*) 70 - 99 mg/dL  GLUCOSE, CAPILLARY     Status: Abnormal   Collection Time    09/11/13 11:24 AM      Result Value Ref Range   Glucose-Capillary 187 (*) 70 - 99 mg/dL  GLUCOSE, CAPILLARY     Status: Abnormal   Collection Time    09/11/13  4:47 PM      Result Value Ref Range   Glucose-Capillary 166 (*) 70 - 99 mg/dL  GLUCOSE, CAPILLARY     Status: Abnormal   Collection Time    09/11/13  8:48 PM      Result Value Ref Range   Glucose-Capillary 165 (*) 70 - 99 mg/dL  CBC WITH DIFFERENTIAL     Status: Abnormal   Collection Time    09/12/13  5:43 AM      Result Value Ref Range   WBC 10.1  4.0 - 10.5 K/uL   RBC 3.09 (*) 4.22 - 5.81 MIL/uL   Hemoglobin 8.5 (*) 13.0 - 17.0 g/dL   HCT 25.9 (*) 39.0 - 52.0 %   MCV 83.8  78.0 - 100.0 fL   MCH 27.5  26.0 - 34.0 pg   MCHC 32.8  30.0 - 36.0 g/dL   RDW 14.9  11.5 - 15.5 %   Platelets 539 (*) 150 - 400 K/uL   Neutrophils Relative % 74  43 - 77 %   Neutro Abs 7.5  1.7 - 7.7 K/uL   Lymphocytes Relative 17  12 - 46 %   Lymphs Abs 1.7  0.7 - 4.0 K/uL   Monocytes Relative 6  3 - 12 %   Monocytes Absolute 0.6  0.1 - 1.0 K/uL   Eosinophils Relative 2  0 - 5 %   Eosinophils Absolute 0.2  0.0 - 0.7 K/uL   Basophils Relative 0  0 - 1 %   Basophils Absolute 0.0  0.0 - 0.1 K/uL  COMPREHENSIVE METABOLIC PANEL     Status: Abnormal   Collection Time    09/12/13  5:43 AM      Result Value Ref Range   Sodium 130 (*) 137 - 147 mEq/L   Potassium 4.7  3.7 - 5.3 mEq/L   Chloride 92 (*) 96 - 112 mEq/L   CO2 26  19 - 32 mEq/L   Glucose, Bld 194 (*) 70 - 99 mg/dL   BUN 19  6 - 23 mg/dL   Creatinine, Ser 1.18  0.50 - 1.35 mg/dL   Calcium 7.7 (*) 8.4 - 10.5 mg/dL   Total Protein 7.6  6.0 - 8.3 g/dL   Albumin 2.4 (*) 3.5 - 5.2 g/dL   AST 115 (*) 0 - 37 U/L   ALT 120 (*) 0 - 53 U/L   Alkaline Phosphatase 96  39 - 117 U/L   Total Bilirubin 0.3  0.3 - 1.2 mg/dL   GFR calc non Af Amer 72 (*) >90 mL/min   GFR calc Af Amer 83 (*) >90 mL/min   Comment: (NOTE)     The eGFR has been calculated using the CKD EPI equation.     This calculation has not been validated in all clinical situations.     eGFR's persistently <90 mL/min signify possible Chronic Kidney     Disease.  GLUCOSE, CAPILLARY     Status: Abnormal   Collection Time    09/12/13  7:14 AM      Result Value Ref Range   Glucose-Capillary 170 (*) 70 - 99 mg/dL   Comment 1 Notify RN    WOUND CULTURE     Status: None   Collection Time    09/12/13  8:51 AM      Result Value Ref Range   Specimen Description WOUND RIGHT GROIN     Special Requests NONE     Gram Stain       Value: RARE WBC PRESENT, PREDOMINANTLY PMN     NO SQUAMOUS EPITHELIAL CELLS SEEN     NO ORGANISMS SEEN     Performed at [facility]   Culture       Value: Culture reincubated for better growth     Performed at [facility]    Report Status PENDING    CULTURE, BLOOD (ROUTINE X 2)     Status: None   Collection Time    09/12/13  9:55 AM      Result Value Ref Range   Specimen Description BLOOD RIGHT ANTECUBITAL     Special Requests BOTTLES DRAWN AEROBIC AND ANAEROBIC 10CC     Culture  Setup Time       Value: 09/12/2013 14:01     Performed at [facility]   Culture       Value:        BLOOD CULTURE RECEIVED NO GROWTH TO DATE CULTURE WILL BE HELD FOR 5 DAYS BEFORE ISSUING A FINAL NEGATIVE REPORT     Performed at [facility]   Report Status PENDING    CULTURE, BLOOD (ROUTINE X 2)     Status: None   Collection Time    09/12/13 10:00 AM      Result Value Ref Range   Specimen Description BLOOD LEFT ANTECUBITAL     Special Requests BOTTLES DRAWN AEROBIC ONLY 6CC     Culture  Setup Time       Value: 09/12/2013 14:00     Performed at [facility]   Culture       Value:        BLOOD CULTURE RECEIVED NO GROWTH TO DATE CULTURE WILL BE HELD FOR 5 DAYS BEFORE ISSUING A FINAL NEGATIVE REPORT     Performed at [facility]   Report Status PENDING    GLUCOSE, CAPILLARY     Status: Abnormal   Collection Time    09/12/13 11:12 AM      Result Value Ref Range   Glucose-Capillary 120 (*) 70 - 99 mg/dL  Comment 1 Notify RN    GLUCOSE, CAPILLARY     Status: Abnormal   Collection Time    09/12/13  4:49 PM      Result Value Ref Range   Glucose-Capillary 136 (*) 70 - 99 mg/dL  GLUCOSE, CAPILLARY     Status: Abnormal   Collection Time    09/12/13  9:48 PM      Result Value Ref Range   Glucose-Capillary 130 (*) 70 - 99 mg/dL  CBC WITH DIFFERENTIAL     Status: Abnormal   Collection Time    09/13/13  4:55 AM      Result Value Ref Range   WBC 10.1  4.0 - 10.5 K/uL   RBC 3.01 (*) 4.22 - 5.81 MIL/uL   Hemoglobin 8.3 (*) 13.0 - 17.0 g/dL   HCT 24.9 (*) 39.0 - 52.0 %   MCV 82.7  78.0 - 100.0 fL   MCH 27.6  26.0 - 34.0 pg   MCHC 33.3  30.0 - 36.0 g/dL   RDW 15.1  11.5 - 15.5 %   Platelets  507 (*) 150 - 400 K/uL   Neutrophils Relative % 70  43 - 77 %   Neutro Abs 7.1  1.7 - 7.7 K/uL   Lymphocytes Relative 18  12 - 46 %   Lymphs Abs 1.9  0.7 - 4.0 K/uL   Monocytes Relative 9  3 - 12 %   Monocytes Absolute 0.9  0.1 - 1.0 K/uL   Eosinophils Relative 2  0 - 5 %   Eosinophils Absolute 0.2  0.0 - 0.7 K/uL   Basophils Relative 0  0 - 1 %   Basophils Absolute 0.0  0.0 - 0.1 K/uL  GLUCOSE, CAPILLARY     Status: Abnormal   Collection Time    09/13/13  7:19 AM      Result Value Ref Range   Glucose-Capillary 161 (*) 70 - 99 mg/dL   Comment 1 Notify RN    GLUCOSE, CAPILLARY     Status: Abnormal   Collection Time    09/13/13 11:34 AM      Result Value Ref Range   Glucose-Capillary 167 (*) 70 - 99 mg/dL   Comment 1 Notify RN    GLUCOSE, CAPILLARY     Status: Abnormal   Collection Time    09/13/13  4:58 PM      Result Value Ref Range   Glucose-Capillary 174 (*) 70 - 99 mg/dL  GLUCOSE, CAPILLARY     Status: Abnormal   Collection Time    09/13/13 10:04 PM      Result Value Ref Range   Glucose-Capillary 158 (*) 70 - 99 mg/dL     HEENT: normal and poor dentition Cardio: RRR and no murmur Resp: CTA B/L GI: BS positive and non distended Extremity:  Left stump edema, min serosang drainage Skin:   Wound staples intact, some maceration Lat aspect Neuro: Alert/Oriented, Anxious and Abnormal Motor 3-/ LHF, will not flex/ext knee on L due to pain Musc/Skel:  Other upper ext and RLE full ROM without pain Gen anxious c/o of severe stump pain   Assessment/Plan: 1. Functional deficits secondary to Left BKA secondary to PAD which require 3+ hours per day of interdisciplinary therapy in a comprehensive inpatient rehab setting. Physiatrist is providing close team supervision and 24 hour management of active medical problems listed below. Physiatrist and rehab team continue to assess barriers to discharge/monitor patient progress toward functional and medical goals. Team conference  today  please see physician documentation under team conference tab, met with team face-to-face to discuss problems,progress, and goals. Formulized individual treatment plan based on medical history, underlying problem and comorbidities. FIM: FIM - Bathing Bathing Steps Patient Completed: Chest;Right Arm;Left Arm;Abdomen Bathing: 2: Max-Patient completes 3-4 [id number] parts or 25-49%  FIM - Upper Body Dressing/Undressing Upper body dressing/undressing steps patient completed: Thread/unthread left sleeve of pullover shirt/dress;Put head through opening of pull over shirt/dress;Pull shirt over trunk;Thread/unthread right sleeve of pullover shirt/dresss Upper body dressing/undressing: 4: Min-Patient completed 75 plus % of tasks FIM - Lower Body Dressing/Undressing Lower body dressing/undressing: 0: Activity did not occur  FIM - Toileting Toileting steps completed by patient: Adjust clothing prior to toileting;Performs perineal hygiene;Adjust clothing after toileting Toileting Assistive Devices: Grab bar or rail for support Toileting: 5: Supervision: Safety issues/verbal cues  FIM - TRadio producerDevices: Grab bars Toilet Transfers: 4-To toilet/BSC: Min A (steadying Pt. > 75%);5-From toilet/BSC: Supervision (verbal cues/safety issues)  FIM - Bed/Chair Transfer Bed/Chair Transfer Assistive Devices: Bed rails;Arm rests;HOB elevated Bed/Chair Transfer: 4: Supine > Sit: Min A (steadying Pt. > 75%/lift 1 leg);4: Bed > Chair or W/C: Min A (steadying Pt. > 75%);4: Chair or W/C > Bed: Min A (steadying Pt. > 75%)  FIM - Locomotion: Wheelchair Distance: 150 Locomotion: Wheelchair: 2: Travels 50 - 149 ft with supervision, cueing or coaxing FIM - Locomotion: Ambulation Locomotion: Ambulation Assistive Devices: WAdministratorAmbulation/Gait Assistance: 4: Min assist Locomotion: Ambulation: 1: Travels less than 50 ft with minimal assistance (Pt.>75%)  Comprehension Comprehension  Mode: Auditory Comprehension: 4-Understands basic 75 - 89% of the time/requires cueing 10 - 24% of the time  Expression Expression Mode: Verbal Expression: 5-Expresses basic needs/ideas: With extra time/assistive device  Social Interaction Social Interaction: 6-Interacts appropriately with others with medication or extra time (anti-anxiety, antidepressant).  Problem Solving Problem Solving: 5-Solves complex 90% of the time/cues < 10% of the time  Memory Memory: 5-Recognizes or recalls 90% of the time/requires cueing < 10% of the time   Medical Problem List and Plan:  1. DVT Prophylaxis/Anticoagulation: Pharmaceutical: Lovenox  2. Pain Management: continue oxycodone prn. Resume neurontin for neuropathy.  3. Anxiety disorder/Mood: continues to be anxious and disoriented.adjust pain meds LCSW to follow for evaluation and support. per fiancee drank about 4oz of alcohol per day (much last than >1/5 per day prior to CVA, also took 110mxanax twice a day for panic attacks.  Saw Mental health and was diagnosed with bipolar D/O 4. Neuropsych: This patient is not capable of making decisions on his own behalf.  5. HTN: Monitor with bid checks. Continue atenolol and lisinopril  6. DM type 2: Monitor BS with ac/hs checks. Discontinue glipizide and resume metformin.  resume 70/30 insulin as indicated. Use SSI for elevated BS.  7. ABLA: will recheck on Monday. Add iron supplement.  8. Hypokalemia: Likely dilutional. D/C IVF and supplement.  9.  EtOH abuse, protocol,add thiamine 10. 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

## 2013-09-14 NOTE — Progress Notes (Signed)
Occupational Therapy Session Note  Patient Details  Name: [name] LauthRobert Vangorden MRN: [id number] Date of Birth: [birth year]  Today's Date: 09/14/2013 Time: 0900-1000 Time Calculation (min): 60 min  Short Term Goals: Week 1:  OT Short Term Goal 1 (Week 1): LTG=STG  Skilled Therapeutic Interventions/Progress Updates:  Patient resting in bed upon arrival with SO at his side.  Engaged in self care retraining to include sponge bath, dressing, transfer to recliner. Focused session on activity tolerance, pain management, walker safety, attention to tasks.  Encouraged patient to spend time reading the First Step magazine for information related to desensitization and patient practiced tapping his residual limb when pain hits him. Patient required encouragement throughout session to fully participate in self care therapy session and was able to stand with walker and steady assist for bathing peri area and buttocks as well as stood 2 times to pull up underware then pants.  Patient ambulated with RW to recliner.  Patient with difficulty staying on task, reading his schedule, and BUE jerky movements.  SO states jerky movements were not problems PTA.  RN observed as well.  Therapy Documentation Precautions:  Precautions Precautions: Fall Precaution Comments: Pt very confused, poor attn span, makes him a large fall risk. Restrictions Weight Bearing Restrictions: Yes LLE Weight Bearing: Non weight bearing Other Position/Activity Restrictions: L BKA Pain: 6/10, premedicated with more medication due during session. ADL: See FIM for current functional status  Therapy/Group: Individual Therapy  [name] 09/14/2013, 10:56 AM

## 2013-09-14 NOTE — Progress Notes (Signed)
Occupational Therapy Session Note  Patient Details  Name: [name] LauthRobert [name] MRN: [id number] Date of Birth: Sep 16, 1965  Today's Date: 09/14/2013 Time: [id number] Time Calculation (min): 33 min  Short Term Goals: Week 1:  OT Short Term Goal 1 (Week 1): LTG=STG  Skilled Therapeutic Interventions/Progress Updates:    Pt performed transfer from bed to wheelchair with min assist squat pivot.  He was able to propel his wheelchair down to the therapy gym with supervision and min instructional cueing to utilize his RLE.  [name] worked on static standing balance at the high/low table.  He was able to maintain standing with min guard assist for 4 minute intervals while working on PVC pipe puzzle.  He needed min assist when attempting to use both hand simultaneously to connect pieces of the puzzle.  Pt with slightly less confusion and better sustained attention this session compared to last visit from this therapist on 3/16.    Therapy Documentation Precautions:  Precautions Precautions: Fall Precaution Comments: Pt very confused, poor attn span, makes him a large fall risk. Restrictions Weight Bearing Restrictions: No LLE Weight Bearing: Non weight bearing Other Position/Activity Restrictions: L BKA  Pain: Pain Assessment Pain Assessment: Faces Pain Score: 6  Faces Pain Scale: Hurts even more Pain Type: Surgical pain Pain Location: Leg Pain Orientation: Left Pain Frequency: Constant Pain Onset: On-going Patients Stated Pain Goal: 4 Pain Intervention(s): Emotional support;Medication (See eMAR) ADL: See FIM for current functional status  Therapy/Group: Individual Therapy  Iker Nuttall OTR/L 09/14/2013, 4:46 PM

## 2013-09-14 NOTE — Progress Notes (Signed)
Physical Therapy Note  Patient Details  Name: [name] LauthRobert [name] MRN: [id number] Date of Birth: 08-25-1965 Today's Date: 09/14/2013  Time: 1300-1330 30 minutes  1:1 Pt c/o residual limb pain, RN made aware. Pt requires max encouragement from PT, girlfriend to participate.  Pt performed gait 3 x 20' with min A for balance.  Pt able to hop onto scale to be weighed with min lifting assist.  Nu step for UE/LE activity tolerance and strengthening x 5 minutes level 4.  Pt requires frequent rests and positive encouragement throughout session.   [name] 09/14/2013, 2:17 PM

## 2013-09-14 NOTE — Progress Notes (Signed)
Physical Therapy Session Note  Patient Details  Name: [name] LauthRobert Schloemer MRN: [id number] Date of Birth: Aug 30, 1965  Today's Date: 09/14/2013 Time: [id number] Time Calculation (min): 58 min  Short Term Goals: Week 1:  PT Short Term Goal 1 (Week 1): Pt will transfer supine <> edge of bed with S. PT Short Term Goal 2 (Week 1): Pt will transfers w/c <> bed with S.  PT Short Term Goal 3 (Week 1): Pt will ambulate with rolling walker about 50 feet with min A.  PT Short Term Goal 4 (Week 1): Pt will ascend/descend 2 stairs with mod A.  PT Short Term Goal 5 (Week 1): Pt will propel w/c about 150 feet with B UEs and R LE S only.  Skilled Therapeutic Interventions/Progress Updates:   Pt received sitting in recliner in room, pts girlfriend present in room at beginning of session.  Pt states he needs to use restroom.   Got pt to ambulate to/from restroom with RW at min/guard level.  Once in restroom, pt able to adjust clothing prior to toileting and perform peri care, however required assist for adjusting clothing following toileting due to slight LOB.  Pt ambulated back to sink and washed hands, however he quickly states "I need to sit."  Pt refused to sit in w/c therefore sat back to recliner.  Pt initially refusing to get back up, however with max encouragement and coaxing, he agreed to participate.  Pt self propelled x 100' using BUEs at supervision level to therapy gym.  Performed 45' x 2 gait training with RW at min/guard level.  Also performed sit <> stand transfers from furniture in ADL apt to better simulate home environment.  Pt performed all at min/guard level with min cues for reaching back with both hands for more controlled descent.  Also performed stair training up/down 5" step with use of RW at min/guard level with demonstration cues with mod verbal cues for correct technique and sequencing.  Ended session with transfer to bed in ADL apt at min assist level and performed supine and SL exercises for LUE;  supine SLR x 10 reps, knee presses x 10 reps, and SL hip abd x 10 reps.  Pt transferred back to w/c and propelled back to room.  Pt left in w/c with quick release belt donned for safety with all needs in reach.    Therapy Documentation Precautions:  Precautions Precautions: Fall Precaution Comments: Pt very confused, poor attn span, makes him a large fall risk. Restrictions Weight Bearing Restrictions: Yes LLE Weight Bearing: Non weight bearing Other Position/Activity Restrictions: L BKA   Pain: Pain Assessment Pain Assessment: 0-10 Pain Score: 9  Pain Type: Surgical pain Pain Location: Leg Pain Orientation: Left Pain Frequency: Constant Pain Onset: On-going Patients Stated Pain Goal: 4 Pain Intervention(s): Medication (See eMAR)   Locomotion : Ambulation Ambulation/Gait Assistance: 5: Supervision;4: Min guard Wheelchair Mobility Distance: 100   See FIM for current functional status  Therapy/Group: Individual Therapy  [name], Yajayra Feldt Ann 09/14/2013, 11:57 AM
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
## 2013-09-15 ENCOUNTER — Inpatient Hospital Stay (HOSPITAL_COMMUNITY): Payer: Medicaid Other | Admitting: Rehabilitation

## 2013-09-15 ENCOUNTER — Encounter (HOSPITAL_COMMUNITY): Payer: Medicaid Other | Admitting: Occupational Therapy

## 2013-09-15 ENCOUNTER — Inpatient Hospital Stay (HOSPITAL_COMMUNITY): Payer: Medicaid Other | Admitting: Occupational Therapy

## 2013-09-15 LAB — GLUCOSE, CAPILLARY
Glucose-Capillary: 141 mg/dL — ABNORMAL HIGH (ref 70–99)
Glucose-Capillary: 127 mg/dL — ABNORMAL HIGH (ref 70–99)
Glucose-Capillary: 124 mg/dL — ABNORMAL HIGH (ref 70–99)

## 2013-09-15 MED ORDER — QUETIAPINE FUMARATE 50 MG PO TABS
50.0000 mg | ORAL_TABLET | Freq: Every day | ORAL | Status: DC
  Administered 2013-09-15 – 2013-09-18 (×4): 50 mg via ORAL
  Filled 2013-09-15 (×6): qty 1

## 2013-09-15 MED ORDER — OXYCODONE HCL 5 MG PO TABS
15.0000 mg | ORAL_TABLET | ORAL | Status: DC | PRN
  Administered 2013-09-15 – 2013-09-17 (×9): 15 mg via ORAL
  Filled 2013-09-15 (×10): qty 3

## 2013-09-15 NOTE — Progress Notes (Signed)
Social Work Patient ID: [name] [name], male   DOB: 1965/12/16, 48 y.o.   MRN: [id number] Team feels pt will be ready for discharge Monday after therapies.  Will check with MD for medical clearance. Will get DME and follow up needs.

## 2013-09-15 NOTE — Progress Notes (Signed)
Occupational Therapy Session Note  Patient Details  Name: [name] LauthRobert [name] MRN: [id number] Date of Birth: Oct 30, 1965  Today's Date: 09/15/2013 Time: [id number] Time Calculation (min): 43 min  Skilled Therapeutic Interventions/Progress Updates:    Pt corked on toilet transfers using the RW.  He was able to stand and ambulate using his RW with min assist level to the bathroom and transfer to the elevated toilet.  Clothing management was also performed at a min guard assist level in standing.  Pt attempted to stand using just the rail so therapist moved the walker closer to him and provided mod instructional cueing to make sure that the walker was closer to him before attempting to stand.  Pt ambulated out to his wheelchair with min assist level.  He reported feeling dizzy in standing so BP was checked sitting at 108/68.  Unable to obtain standing BP secondary to pt having to use his UEs for support on the walker and the automated cuff would not register.  Finished session by having pt work on propelling his wheelchair up the hall while therapist provided resistance.  Had pt donn gloves to help increased his gripping ability on the wheelchair rim.   Therapy Documentation Precautions:  Precautions Precautions: Fall Precaution Comments: Pt very confused, poor attn span, makes him a large fall risk. Restrictions Weight Bearing Restrictions: No LLE Weight Bearing: Non weight bearing Other Position/Activity Restrictions: L BKA  Pain: Pain Assessment Pain Assessment: 0-10 Pain Score: 9  Faces Pain Scale: Hurts little more Pain Type: Surgical pain Pain Location: Leg Pain Orientation: Left Pain Frequency: Constant Pain Onset: On-going Patients Stated Pain Goal: 4 Pain Intervention(s): Medication (See eMAR) Multiple Pain Sites: No ADL: See FIM for current functional status  Therapy/Group: Individual Therapy  Glady Ouderkirk OTR/L 09/15/2013, 3:58 PM

## 2013-09-15 NOTE — Progress Notes (Signed)
Physical Therapy Session Note  Patient Details  Name: [name] [name] MRN: [id number] Date of Birth: Oct 13, 1965  Today's Date: 09/15/2013 Time: 1101-1200 Time Calculation (min): 59 min  Short Term Goals: Week 1:  PT Short Term Goal 1 (Week 1): Pt will transfer supine <> edge of bed with S. PT Short Term Goal 2 (Week 1): Pt will transfers w/c <> bed with S.  PT Short Term Goal 3 (Week 1): Pt will ambulate with rolling walker about 50 feet with min A.  PT Short Term Goal 4 (Week 1): Pt will ascend/descend 2 stairs with mod A.  PT Short Term Goal 5 (Week 1): Pt will propel w/c about 150 feet with B UEs and R LE S only.  Skilled Therapeutic Interventions/Progress Updates:   Pt received lying in bed, agreeable to therapy this morning.  Note he was able to state where he was and why he was in hospital (first time he has done this with this PT).  Pt able to perform bed mobility with HOB flat and without handrails at supervision.  Once at EOB, note ACE wrap was somewhat loose, therefore provided education and questioned pt to whether or not he had wrapped leg and importance of wrapping leg.  Pt was able to state "to control drainage."  Also educated that wrapping with ace wrap was also to assist with swelling and shaping of residual limb to prepare for future prosthetic fit.  Educated pt on "figure 8" technique to wrap and not wrapping in circular fashion to cause swelling in bottom of limb and decrease blood flow.  PT re-wrapped residual limb and then had pt return demonstration.  Requires min assist hand over hand cues for correct technique, however he was able to perform.  Will continue to educate on wrapping and also performing desensitization techniques.  Focus of session was gait training with RW to improve technique and increase activity tolerance.  Performed 6865' x 1, 45' x 1, and 40' x 1 with RW at supervision level.  Cues for increased pursed lip breathing and taking his time.  Also performed car  transfer at supervision level with min cues for safety and technique.  Ended session with standing balance working towards no UE support.  Pt only able to tolerate up to approx 10-15 secs without UE support.  Pt states increased fear without UE support.  Pt returned to w/c, ambulated part of distance back to room and propelled remainder of distance with BUEs.  Pt left in room with quick release belt donned and all needs in reach.    Therapy Documentation Precautions:  Precautions Precautions: Fall Precaution Comments: Pt very confused, poor attn span, makes him a large fall risk. Restrictions Weight Bearing Restrictions: No LLE Weight Bearing: Non weight bearing Other Position/Activity Restrictions: L BKA   Pain: Pain Assessment Pain Assessment: Faces Pain Score: 8  Faces Pain Scale: Hurts even more Pain Type: Surgical pain Pain Location: Leg Pain Orientation: Left Pain Frequency: Constant Pain Onset: On-going Patients Stated Pain Goal: 4 Pain Intervention(s): Medication (See eMAR);Repositioned;Emotional support   Locomotion : Ambulation Ambulation/Gait Assistance: 5: Supervision Wheelchair Mobility Distance: 100 (x 2 reps)   See FIM for current functional status  Therapy/Group: Individual Therapy  [name], Yahayra Geis Ann 09/15/2013, 12:19 PM

## 2013-09-15 NOTE — Patient Care Conference (Signed)
Inpatient RehabilitationTeam Conference and Plan of Care Update Date: 09/14/2013   Time: 11:40 AM    Patient Name: [name] [name]      Medical Record Number: [id number]  Date of Birth: 1966-02-16 Sex: Male         Room/Bed: 4M01C/4M01C-01 Payor Info: Payor: MEDICAID [place] / Plan: MEDICAID [place]  / Product Type: *No Product type* /    Admitting Diagnosis: L BKA HX ETOH  Admit Date/Time:  09/09/2013  2:11 PM Admission Comments: No comment available   Primary Diagnosis:  Unilateral complete BKA Principal Problem: Unilateral complete BKA  Patient Active Problem List   Diagnosis Date Noted  . Confusion, postoperative 09/16/2013  . Unilateral complete BKA 09/09/2013  . Ischemic leg 08/30/2013  . Incisional hernia without mention of obstruction or gangrene 04/06/2013  . PAD (peripheral artery disease) 03/30/2013  . Peripheral vascular disease, unspecified 01/22/2012  . Diabetes mellitus 01/16/2012  . PVD (peripheral vascular disease) 01/13/2012  . Uncontrolled hypertension   . GERD (gastroesophageal reflux disease)   . Nicotine addiction   . Anxiety   . Occlusion and stenosis of carotid artery without mention of cerebral infarction 10/02/2011  . Atherosclerosis of native arteries of the extremities with intermittent claudication 10/02/2011  . Claudication 09/04/2011  . Stroke 08/11/2011    Expected Discharge Date: Expected Discharge Date: 09/20/13  Team Members Present: Physician leading conference: Dr. Claudette Laws Social Worker Present: Staci Acosta, LCSW Nurse Present: Kennyth Arnold, RN PT Present: Edman Circle, Clearnce Hasten, PT OT Present: Mackie Pai, OT;James Dorthea [place]ove, OT SLP Present: Fae Pippin, SLP PPS Coordinator present : Tora Duck, RN, CRRN     Current Status/Progress Goal Weekly Team Focus  Medical   Cognitive deficits, psychiatric issues  Stabilize mental status to allow for participation rehabilitation  Psychiatry evaluation, medication  adjustment   Bowel/Bladder   continent Bowel and bladder LBM 09/13/13  min assist  continue to monitor    Swallow/Nutrition/ Hydration     WFL        ADL's   according to FIM - max assist bathing, min assist UB dressing, min assist toilet transfers.  Pt with decreased participation due to severe pain and fatigue  mod I - supervision  increased participation in treatment sessions, transfers, self-care retraining   Mobility   Pt is currently min assist for stand pivot transfers, min assist for gait with RW, min assist to min/guard for standing balance.  continues to have severe pain, however also demonstrates increased confusion during sessions.  Unsure of true baseline.   supervision to mod I (min assist for stairs)  transfers, balance, gait, stairs, pt/family education   Communication     Residual issues from past CVA-not being seen by SPT   baseline   back to baseline  Safety/Cognition/ Behavioral Observations  min assist  no unsafe behavior  continue to monitor   Pain   Robaxin 750mg  QID scheduled/Oxycodone 5-10mg  q4hrs prn/rates pain 5-9  min assist  monitor pain q shift   Skin   min assist  no skin breakdown this admission  skin assessment q shift    Rehab Goals Patient on target to meet rehab goals: Yes Rehab Goals Revised: None *See Care Plan and progress notes for long and short-term goals.  Barriers to Discharge: See above    Possible Resolutions to Barriers:  See above    Discharge Planning/Teaching Needs:  Pt to return to his apartment with his girlfriend.  Pt's girlfriend has been present for therapy.  Team Discussion:  Pt with a bipolar diagnosis and has never really received treatment for it.  Dr. Wynn BankerKirsteins to ask psychiatry to see pt.  Pt may also be experiencing ETOH withdrawal, as well and he has a hx of previous CVA.  Pt will need supervision for short ambulation and min assist for stairs.  He will be modified independent for other activities.    Revisions to  Treatment Plan:  None   Continued Need for Acute Rehabilitation Level of Care: The patient requires daily medical management by a physician with specialized training in physical medicine and rehabilitation for the following conditions: Daily direction of a multidisciplinary physical rehabilitation program to ensure safe treatment while eliciting the highest outcome that is of practical value to the patient.: Yes Daily medical management of patient stability for increased activity during participation in an intensive rehabilitation regime.: Yes Daily analysis of laboratory values and/or radiology reports with any subsequent need for medication adjustment of medical intervention for : Other;Post surgical problems  Maciah Feeback, Lemar LivingsRebecca G 09/16/2013, 10:35 AM

## 2013-09-15 NOTE — Progress Notes (Signed)
Physical Therapy Session Note  Patient Details  Name: [name] [name] MRN: [id number] Date of Birth: December 10, 1965  Today's Date: 09/15/2013 Time: [id number] Time Calculation (min): 15 min  Short Term Goals: Week 1:  PT Short Term Goal 1 (Week 1): Pt will transfer supine <> edge of bed with S. PT Short Term Goal 2 (Week 1): Pt will transfers w/c <> bed with S.  PT Short Term Goal 3 (Week 1): Pt will ambulate with rolling walker about 50 feet with min A.  PT Short Term Goal 4 (Week 1): Pt will ascend/descend 2 stairs with mod A.  PT Short Term Goal 5 (Week 1): Pt will propel w/c about 150 feet with B UEs and R LE S only.  Skilled Therapeutic Interventions/Progress Updates:   Pt received using restroom, nurse tech present in room for supervision, therefore missed 10 mins during beginning of session.  Once pt states he is ready to stand, he was able to do so at supervision, adjust clothing while maintaining balance and performed peri care.  Pt ambulated out of restroom with RW at supervision level to w/c.  Pt with increased pain in residual limb, but agreeable to propel to get coffee.  Pts girlfriend came in during session.  Discussed possible Saturday D/C, however she states that grab bar is being installed in restroom on Saturday and would prefer a Monday D/C, therefore will plan for D/C after therapies on Monday.  CSW to be notified about equipment.  Pt propelled out of room into hallway, and while explaining desensitization to wife, pt states "I can't do this" and requested to return to room.  Wife verbalized understanding about progressing from light soft materials on residual limb to more of a wash cloth/rougher surface to decrease sensitivity in limb.  Pt returned to room and transferred back to bed via RW at supervision level.  Pt left in bed with bed alarm set and all needs in reach.   Therapy Documentation Precautions:  Precautions Precautions: Fall Precaution Comments: Pt very confused, poor  attn span, makes him a large fall risk. Restrictions Weight Bearing Restrictions: No LLE Weight Bearing: Non weight bearing Other Position/Activity Restrictions: L BKA General: Amount of Missed PT Time (min): 15 Minutes Missed Time Reason:  (pt toileting and fatigue/pain) Vital Signs: Therapy Vitals Temp: 98.2 F (36.8 C) Temp src: Oral Pulse Rate: 82 Resp: 18 BP: 108/68 mmHg Patient Position, if appropriate: Sitting Oxygen Therapy SpO2: 99 % O2 Device: None (Room air) Pain: Pain Assessment Pain Assessment: 0-10 Pain Score: 9  Pain Type: Surgical pain Pain Location: Leg Pain Orientation: Left Pain Frequency: Constant Pain Onset: On-going Patients Stated Pain Goal: 4 Pain Intervention(s): Medication (See eMAR)   Locomotion : Ambulation Ambulation/Gait Assistance: 5: Supervision Wheelchair Mobility Distance: 100 (x 2 reps)   See FIM for current functional status  Therapy/Group: Individual Therapy  [name], River Ambrosio Ann 09/15/2013, 3:28 PM

## 2013-09-15 NOTE — Progress Notes (Signed)
Occupational Therapy Session Note  Patient Details  Name: [name] LauthRobert Kooy MRN: [id number] Date of Birth: 1966/01/17  Today's Date: 09/15/2013 Time: 0901-1001 Time Calculation (min): 60 min  Short Term Goals: Week 1:  OT Short Term Goal 1 (Week 1): LTG=STG  Skilled Therapeutic Interventions/Progress Updates:    Pt worked on bathing and dressing to begin session.  Pt alert and cooperative to transfer to his wheelchair and then transition to sitting in front of the sink.  Performed all bathing with min instructional cueing and min guard assist sit to stand.  He needed mod questioning cues to sequence dressing however.  Pt did all standing with min guard assist when washing peri area or when pulling underpants and pants over his hips.  Still with moderate pain in the R residual limb which also distracts pt's attention level.  Therapist rolled pt down to the gym for second part of session secondary to his current pain.  Utilized the UE ergonometer for 5 mins during session in a 3 minute and 2 minute intervals.  Resistance set at level 12 and and pt kept RPMs up to 25-30.  Pt concluded session by rolling himself back to his room with supervision and transferring squat pivot to the left to bet back into his bed with min steady assist.    Therapy Documentation Precautions:  Precautions Precautions: Fall Precaution Comments: Pt very confused, poor attn span, makes him a large fall risk. Restrictions Weight Bearing Restrictions: No LLE Weight Bearing: Non weight bearing Other Position/Activity Restrictions: L BKA  Pain: Pain Assessment Pain Assessment: Faces Pain Score: 8  Faces Pain Scale: Hurts even more Pain Type: Surgical pain Pain Location: Leg Pain Orientation: Left Pain Frequency: Constant Pain Onset: On-going Patients Stated Pain Goal: 4 Pain Intervention(s): Medication (See eMAR);Repositioned;Emotional support ADL: See FIM for current functional status  Therapy/Group: Individual  Therapy  Starlette Thurow OTR/L 09/15/2013, 10:39 AM

## 2013-09-15 NOTE — Progress Notes (Signed)
Appreciate psych note 48 y.o. male with h/o HTN, DM, tobacco abuse, ASPVD s/p L-CEA and aortobifemoral BG; who was admitted on 08/30/13 with cold, pulseless, painful left foot. CTA with occluded ABF and patient underwent bilateral iliac thrombectomy, left femoral endarterectomy with left to right femoral-femoral bypass, left popliteal and tibial embolectomy and LLE four compartment fasciotomy by Dr. Oneida Alar on 08/31/13. Post op with ABLA requiring transfusion, continued pain as well as confusion (due to PCA?). 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 Oneida Alar  Subjective/Complaints: Very somnolent after seroquel 173m, diificult to arouse Discussed with fiancee was on 151mof Oxycodone at home on a chronic basis  Review of Systems - Negative except somnolent  Objective: Vital Signs: Blood pressure 120/68, pulse 80, temperature 97.2 F (36.2 C), temperature source Oral, resp. rate 18, height 6' 2"  (1.88 m), weight 99.383 kg (219 lb 1.6 oz), SpO2 99.00%. No results found. Results for orders placed during the hospital encounter of 09/09/13 (from the past 72 hour(s))  GLUCOSE, CAPILLARY     Status: Abnormal   Collection Time    09/12/13  7:14 AM      Result Value Ref Range   Glucose-Capillary 170 (*) 70 - 99 mg/dL   Comment 1 Notify RN    WOUND CULTURE     Status: None   Collection Time    09/12/13  8:51 AM      Result Value Ref Range   Specimen Description WOUND RIGHT GROIN     Special Requests NONE     Gram Stain       Value: RARE WBC PRESENT, PREDOMINANTLY PMN     NO SQUAMOUS EPITHELIAL CELLS SEEN     NO ORGANISMS SEEN     Performed at [facility]       Value: MULTIPLE ORGANISMS PRESENT, NONE PREDOMINANT     Note: NO STAPHYLOCOCCUS AUREUS ISOLATED NO GROUP A STREP (S.PYOGENES) ISOLATED     Performed at [facility] Report Status 09/14/2013 FINAL    CULTURE, BLOOD (ROUTINE X 2)     Status: None   Collection Time    09/12/13  9:55 AM   Result Value Ref Range   Specimen Description BLOOD RIGHT ANTECUBITAL     Special Requests BOTTLES DRAWN AEROBIC AND ANAEROBIC 10CC     Culture  Setup Time       Value: 09/12/2013 14:01     Performed at [facility]       Value:        BLOOD CULTURE RECEIVED NO GROWTH TO DATE CULTURE WILL BE HELD FOR 5 DAYS BEFORE ISSUING A FINAL NEGATIVE REPORT     Performed at [facility] Report Status PENDING    CULTURE, BLOOD (ROUTINE X 2)     Status: None   Collection Time    09/12/13 10:00 AM      Result Value Ref Range   Specimen Description BLOOD LEFT ANTECUBITAL     Special Requests BOTTLES DRAWN AEROBIC ONLY 6CC     Culture  Setup Time       Value: 09/12/2013 14:00     Performed at [facility]       Value:        BLOOD CULTURE RECEIVED NO GROWTH TO DATE CULTURE WILL BE HELD FOR 5 DAYS BEFORE ISSUING A FINAL NEGATIVE REPORT     Performed at [facility]  Report Status PENDING    GLUCOSE, CAPILLARY     Status: Abnormal   Collection Time    09/12/13 11:12 AM      Result Value Ref Range   Glucose-Capillary 120 (*) 70 - 99 mg/dL   Comment 1 Notify RN    GLUCOSE, CAPILLARY     Status: Abnormal   Collection Time    09/12/13  4:49 PM      Result Value Ref Range   Glucose-Capillary 136 (*) 70 - 99 mg/dL  GLUCOSE, CAPILLARY     Status: Abnormal   Collection Time    09/12/13  9:48 PM      Result Value Ref Range   Glucose-Capillary 130 (*) 70 - 99 mg/dL  CBC WITH DIFFERENTIAL     Status: Abnormal   Collection Time    09/13/13  4:55 AM      Result Value Ref Range   WBC 10.1  4.0 - 10.5 K/uL   RBC 3.01 (*) 4.22 - 5.81 MIL/uL   Hemoglobin 8.3 (*) 13.0 - 17.0 g/dL   HCT 24.9 (*) 39.0 - 52.0 %   MCV 82.7  78.0 - 100.0 fL   MCH 27.6  26.0 - 34.0 pg   MCHC 33.3  30.0 - 36.0 g/dL   RDW 15.1  11.5 - 15.5 %   Platelets 507 (*) 150 - 400 K/uL   Neutrophils Relative % 70  43 - 77 %   Neutro Abs 7.1  1.7 - 7.7 K/uL   Lymphocytes Relative  18  12 - 46 %   Lymphs Abs 1.9  0.7 - 4.0 K/uL   Monocytes Relative 9  3 - 12 %   Monocytes Absolute 0.9  0.1 - 1.0 K/uL   Eosinophils Relative 2  0 - 5 %   Eosinophils Absolute 0.2  0.0 - 0.7 K/uL   Basophils Relative 0  0 - 1 %   Basophils Absolute 0.0  0.0 - 0.1 K/uL  GLUCOSE, CAPILLARY     Status: Abnormal   Collection Time    09/13/13  7:19 AM      Result Value Ref Range   Glucose-Capillary 161 (*) 70 - 99 mg/dL   Comment 1 Notify RN    GLUCOSE, CAPILLARY     Status: Abnormal   Collection Time    09/13/13 11:34 AM      Result Value Ref Range   Glucose-Capillary 167 (*) 70 - 99 mg/dL   Comment 1 Notify RN    GLUCOSE, CAPILLARY     Status: Abnormal   Collection Time    09/13/13  4:58 PM      Result Value Ref Range   Glucose-Capillary 174 (*) 70 - 99 mg/dL  GLUCOSE, CAPILLARY     Status: Abnormal   Collection Time    09/13/13 10:04 PM      Result Value Ref Range   Glucose-Capillary 158 (*) 70 - 99 mg/dL  BASIC METABOLIC PANEL     Status: Abnormal   Collection Time    09/14/13  6:13 AM      Result Value Ref Range   Sodium 134 (*) 137 - 147 mEq/L   Potassium 4.7  3.7 - 5.3 mEq/L   Chloride 94 (*) 96 - 112 mEq/L   CO2 25  19 - 32 mEq/L   Glucose, Bld 136 (*) 70 - 99 mg/dL   BUN 16  6 - 23 mg/dL   Creatinine, Ser 1.03  0.50 - 1.35 mg/dL  Calcium 8.3 (*) 8.4 - 10.5 mg/dL   GFR calc non Af Amer 85 (*) >90 mL/min   GFR calc Af Amer >90  >90 mL/min   Comment: (NOTE)     The eGFR has been calculated using the CKD EPI equation.     This calculation has not been validated in all clinical situations.     eGFR's persistently <90 mL/min signify possible Chronic Kidney     Disease.  GLUCOSE, CAPILLARY     Status: Abnormal   Collection Time    09/14/13  7:16 AM      Result Value Ref Range   Glucose-Capillary 140 (*) 70 - 99 mg/dL   Comment 1 Notify RN    GLUCOSE, CAPILLARY     Status: Abnormal   Collection Time    09/14/13 11:17 AM      Result Value Ref Range    Glucose-Capillary 140 (*) 70 - 99 mg/dL   Comment 1 Notify RN    GLUCOSE, CAPILLARY     Status: Abnormal   Collection Time    09/14/13  5:13 PM      Result Value Ref Range   Glucose-Capillary 137 (*) 70 - 99 mg/dL   Comment 1 Notify RN    GLUCOSE, CAPILLARY     Status: Abnormal   Collection Time    09/14/13  8:59 PM      Result Value Ref Range   Glucose-Capillary 156 (*) 70 - 99 mg/dL     HEENT: normal and poor dentition Cardio: RRR and no murmur Resp: CTA B/L GI: BS positive and non distended Extremity:  Left stump edema, min serosang drainage Skin:   Wound staples intact, some maceration Lat aspect Neuro: Alert/Oriented, Anxious and Abnormal Motor 3-/ LHF, will not flex/ext knee on L due to pain Musc/Skel:  Other upper ext and RLE full ROM without pain Gen anxious c/o of severe stump pain   Assessment/Plan: 1. Functional deficits secondary to Left BKA secondary to PAD which require 3+ hours per day of interdisciplinary therapy in a comprehensive inpatient rehab setting. Physiatrist is providing close team supervision and 24 hour management of active medical problems listed below. Physiatrist and rehab team continue to assess barriers to discharge/monitor patient progress toward functional and medical goals. Marland Kitchen FIM: FIM - Bathing Bathing Steps Patient Completed: Chest;Right Arm;Left Arm;Abdomen;Right upper leg;Right lower leg (including foot);Front perineal area;Buttocks (sit EOB and stand) Bathing: 4: Steadying assist  FIM - Upper Body Dressing/Undressing Upper body dressing/undressing steps patient completed: Thread/unthread right sleeve of pullover shirt/dresss;Thread/unthread left sleeve of pullover shirt/dress;Put head through opening of pull over shirt/dress;Pull shirt over trunk Upper body dressing/undressing: 5: Set-up assist to: Obtain clothing/put away FIM - Lower Body Dressing/Undressing Lower body dressing/undressing steps patient completed: Don/Doff right  sock;Pull pants up/down;Thread/unthread right pants leg;Thread/unthread left pants leg;Thread/unthread right underwear leg;Thread/unthread left underwear leg;Pull underwear up/down;Don/Doff right shoe Lower body dressing/undressing: 4: Steadying Assist  FIM - Toileting Toileting steps completed by patient: Performs perineal hygiene;Adjust clothing prior to toileting Toileting Assistive Devices: Grab bar or rail for support Toileting: 3: Mod-Patient completed 2 of 3 steps  FIM - Radio producer Devices: Grab bars Toilet Transfers: 4-To toilet/BSC: Min A (steadying Pt. > 75%);5-From toilet/BSC: Supervision (verbal cues/safety issues)  FIM - Bed/Chair Transfer Bed/Chair Transfer Assistive Devices: Copy: 5: Supine > Sit: Supervision (verbal cues/safety issues);4: Bed > Chair or W/C: Min A (steadying Pt. > 75%)  FIM - Locomotion: Wheelchair Distance: 100 Locomotion: Wheelchair: 2:  Travels 6 - 149 ft with supervision, cueing or coaxing FIM - Locomotion: Ambulation Locomotion: Ambulation Assistive Devices: Walker - Rolling Ambulation/Gait Assistance: 5: Supervision;4: Min guard Locomotion: Ambulation: 1: Travels less than 50 ft with minimal assistance (Pt.>75%)  Comprehension Comprehension Mode: Auditory Comprehension: 4-Understands basic 75 - 89% of the time/requires cueing 10 - 24% of the time  Expression Expression Mode: Verbal Expression: 5-Expresses basic needs/ideas: With extra time/assistive device  Social Interaction Social Interaction: 5-Interacts appropriately 90% of the time - Needs monitoring or encouragement for participation or interaction.  Problem Solving Problem Solving: 5-Solves basic 90% of the time/requires cueing < 10% of the time  Memory Memory: 5-Recognizes or recalls 90% of the time/requires cueing < 10% of the time   Medical Problem List and Plan:  1. DVT Prophylaxis/Anticoagulation: Pharmaceutical: Lovenox   2. Pain Management: continue oxycodone prn. Resume neurontin for neuropathy.  3. Anxiety disorder/Mood: continues to be anxious and disoriented.adjust pain meds LCSW to follow for evaluation and support. per fiancee drank about 4oz of alcohol per day (much last than >1/5 per day prior to CVA, also took 24m xanax twice a day for panic attacks.  Saw Mental health and was diagnosed with bipolar D/O, now switched to Klonopin will d/c librium, reduce seroquel to limit somnolence 4. Neuropsych: This patient is not capable of making decisions on his own behalf.  5. HTN: Monitor with bid checks. Continue atenolol and lisinopril  6. DM type 2: Monitor BS with ac/hs checks. Discontinue glipizide and resume metformin.  resume 70/30 insulin as indicated. Use SSI for elevated BS.  7. ABLA: will recheck on Monday. Add iron supplement.  8. Hypokalemia: Likely dilutional. D/C IVF and supplement.  9.  EtOH abuse, protocol,add thiamine 10. 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

## 2013-09-16 ENCOUNTER — Inpatient Hospital Stay (HOSPITAL_COMMUNITY): Payer: Medicaid Other | Admitting: Rehabilitation

## 2013-09-16 ENCOUNTER — Inpatient Hospital Stay (HOSPITAL_COMMUNITY): Payer: Medicaid Other | Admitting: Occupational Therapy

## 2013-09-16 DIAGNOSIS — L98499 Non-pressure chronic ulcer of skin of other sites with unspecified severity: Secondary | ICD-10-CM

## 2013-09-16 DIAGNOSIS — S88119A Complete traumatic amputation at level between knee and ankle, unspecified lower leg, initial encounter: Secondary | ICD-10-CM

## 2013-09-16 DIAGNOSIS — E119 Type 2 diabetes mellitus without complications: Secondary | ICD-10-CM

## 2013-09-16 DIAGNOSIS — I10 Essential (primary) hypertension: Secondary | ICD-10-CM

## 2013-09-16 DIAGNOSIS — D62 Acute posthemorrhagic anemia: Secondary | ICD-10-CM

## 2013-09-16 DIAGNOSIS — R410 Disorientation, unspecified: Secondary | ICD-10-CM

## 2013-09-16 DIAGNOSIS — I739 Peripheral vascular disease, unspecified: Secondary | ICD-10-CM

## 2013-09-16 LAB — GLUCOSE, CAPILLARY
GLUCOSE-CAPILLARY: 148 mg/dL — AB (ref 70–99)
GLUCOSE-CAPILLARY: 209 mg/dL — AB (ref 70–99)
GLUCOSE-CAPILLARY: 140 mg/dL — AB (ref 70–99)
Glucose-Capillary: 119 mg/dL — ABNORMAL HIGH (ref 70–99)
Glucose-Capillary: 147 mg/dL — ABNORMAL HIGH (ref 70–99)

## 2013-09-16 MED ORDER — GABAPENTIN 300 MG PO CAPS
300.0000 mg | ORAL_CAPSULE | Freq: Three times a day (TID) | ORAL | Status: DC
  Administered 2013-09-16 – 2013-09-19 (×10): 300 mg via ORAL
  Filled 2013-09-16 (×13): qty 1

## 2013-09-16 NOTE — Progress Notes (Signed)
[facility]  Subjective  - Feels better   Objective 122/72 83 97.2 F (36.2 C) (Oral) 18 96%  Intake/Output Summary (Last 24 hours) at 09/16/13 1355 Last data filed at 09/16/13 1200  Gross per 24 hour  Intake    480 ml  Output    300 ml  Net    180 ml   Left BKA still with some ischemic skin edges laterally but overall improve Bilateral groin incisions some maceration left groin but improved less than 1mm depth 3 cm length Right groin with slightly deeper tract at inferior aspect no exposed graft  Assessment/Planning: Continue current wound care Would complete full 10 days of antibiotics then stop He has follow up with me next Thursday Will see him again on Monday  [name] [name] E 09/16/2013 1:55 PM --  Laboratory Lab Results: No results found for this basename: WBC, HGB, HCT, PLT,  in the last 72 hours BMET  Recent Labs  09/14/13 0613  NA 134*  K 4.7  CL 94*  CO2 25  GLUCOSE 136*  BUN 16  CREATININE 1.03  CALCIUM 8.3*    COAG Lab Results  Component Value Date   INR 1.14 09/05/2013   INR 0.99 08/30/2013   INR 1.36 01/12/2012   No results found for this basename: PTT

## 2013-09-16 NOTE — Progress Notes (Signed)
Appreciate psych note 48 y.o. male with h/o HTN, DM, tobacco abuse, ASPVD s/p L-CEA and aortobifemoral BG; who was admitted on 08/30/13 with cold, pulseless, painful left foot. CTA with occluded ABF and patient underwent bilateral iliac thrombectomy, left femoral endarterectomy with left to right femoral-femoral bypass, left popliteal and tibial embolectomy and LLE four compartment fasciotomy by Dr. Oneida Alar on 08/31/13. Post op with ABLA requiring transfusion, continued pain as well as confusion (due to PCA?). 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 Oneida Alar  Subjective/Complaints: Slept well until 5 am on 58m seroquel Discussed with fiancee was on 160mof Oxycodone at home on a chronic basis Twitching with hands , spilling drinks Discussed neg cultures Review of Systems - Negative except somnolent  Objective: Vital Signs: Blood pressure 122/72, pulse 83, temperature 97.2 F (36.2 C), temperature source Oral, resp. rate 18, height 6' 2"  (1.88 m), weight 99.383 kg (219 lb 1.6 oz), SpO2 96.00%. No results found. Results for orders placed during the hospital encounter of 09/09/13 (from the past 72 hour(s))  GLUCOSE, CAPILLARY     Status: Abnormal   Collection Time    09/13/13  7:19 AM      Result Value Ref Range   Glucose-Capillary 161 (*) 70 - 99 mg/dL   Comment 1 Notify RN    GLUCOSE, CAPILLARY     Status: Abnormal   Collection Time    09/13/13 11:34 AM      Result Value Ref Range   Glucose-Capillary 167 (*) 70 - 99 mg/dL   Comment 1 Notify RN    GLUCOSE, CAPILLARY     Status: Abnormal   Collection Time    09/13/13  4:58 PM      Result Value Ref Range   Glucose-Capillary 174 (*) 70 - 99 mg/dL  GLUCOSE, CAPILLARY     Status: Abnormal   Collection Time    09/13/13 10:04 PM      Result Value Ref Range   Glucose-Capillary 158 (*) 70 - 99 mg/dL  BASIC METABOLIC PANEL     Status: Abnormal   Collection Time    09/14/13  6:13 AM      Result Value Ref Range   Sodium 134 (*)  137 - 147 mEq/L   Potassium 4.7  3.7 - 5.3 mEq/L   Chloride 94 (*) 96 - 112 mEq/L   CO2 25  19 - 32 mEq/L   Glucose, Bld 136 (*) 70 - 99 mg/dL   BUN 16  6 - 23 mg/dL   Creatinine, Ser 1.03  0.50 - 1.35 mg/dL   Calcium 8.3 (*) 8.4 - 10.5 mg/dL   GFR calc non Af Amer 85 (*) >90 mL/min   GFR calc Af Amer >90  >90 mL/min   Comment: (NOTE)     The eGFR has been calculated using the CKD EPI equation.     This calculation has not been validated in all clinical situations.     eGFR's persistently <90 mL/min signify possible Chronic Kidney     Disease.  GLUCOSE, CAPILLARY     Status: Abnormal   Collection Time    09/14/13  7:16 AM      Result Value Ref Range   Glucose-Capillary 140 (*) 70 - 99 mg/dL   Comment 1 Notify RN    GLUCOSE, CAPILLARY     Status: Abnormal   Collection Time    09/14/13 11:17 AM      Result Value Ref Range   Glucose-Capillary 140 (*)  70 - 99 mg/dL   Comment 1 Notify RN    GLUCOSE, CAPILLARY     Status: Abnormal   Collection Time    09/14/13  5:13 PM      Result Value Ref Range   Glucose-Capillary 137 (*) 70 - 99 mg/dL   Comment 1 Notify RN    GLUCOSE, CAPILLARY     Status: Abnormal   Collection Time    09/14/13  8:59 PM      Result Value Ref Range   Glucose-Capillary 156 (*) 70 - 99 mg/dL  GLUCOSE, CAPILLARY     Status: Abnormal   Collection Time    09/15/13  7:15 AM      Result Value Ref Range   Glucose-Capillary 141 (*) 70 - 99 mg/dL   Comment 1 Notify RN    GLUCOSE, CAPILLARY     Status: Abnormal   Collection Time    09/15/13 12:00 PM      Result Value Ref Range   Glucose-Capillary 127 (*) 70 - 99 mg/dL  GLUCOSE, CAPILLARY     Status: Abnormal   Collection Time    09/15/13  5:06 PM      Result Value Ref Range   Glucose-Capillary 124 (*) 70 - 99 mg/dL     HEENT: normal and poor dentition Cardio: RRR and no murmur Resp: CTA B/L GI: BS positive and non distended Extremity:  Left stump edema, min serosang drainage Skin:   Wound Ace Wrapped no  drainage on kerlix Neuro: Alert/Oriented, Anxious and Abnormal Motor 3-/ LHF, will not flex/ext knee on L due to pain Musc/Skel:  Other upper ext and RLE full ROM without pain Gen anxious c/o of severe stump pain   Assessment/Plan: 1. Functional deficits secondary to Left BKA secondary to PAD which require 3+ hours per day of interdisciplinary therapy in a comprehensive inpatient rehab setting. Physiatrist is providing close team supervision and 24 hour management of active medical problems listed below. Physiatrist and rehab team continue to assess barriers to discharge/monitor patient progress toward functional and medical goals. .Plan D/C monday FIM: FIM - Bathing Bathing Steps Patient Completed: Chest;Right Arm;Left Arm;Abdomen;Right upper leg;Right lower leg (including foot);Front perineal area;Buttocks Bathing: 5: Supervision: Safety issues/verbal cues  FIM - Upper Body Dressing/Undressing Upper body dressing/undressing steps patient completed: Thread/unthread right sleeve of pullover shirt/dresss;Thread/unthread left sleeve of pullover shirt/dress;Put head through opening of pull over shirt/dress;Pull shirt over trunk Upper body dressing/undressing: 5: Set-up assist to: Obtain clothing/put away FIM - Lower Body Dressing/Undressing Lower body dressing/undressing steps patient completed: Don/Doff right sock;Pull pants up/down;Thread/unthread right pants leg;Thread/unthread left pants leg;Thread/unthread right underwear leg;Thread/unthread left underwear leg;Pull underwear up/down;Don/Doff right shoe Lower body dressing/undressing: 5: Supervision: Safety issues/verbal cues  FIM - Toileting Toileting steps completed by patient: Adjust clothing prior to toileting;Performs perineal hygiene;Adjust clothing after toileting Toileting Assistive Devices: Grab bar or rail for support Toileting: 4: Steadying assist  FIM - Radio producer Devices: Grab bars;Elevated  toilet seat;Walker Toilet Transfers: 4-To toilet/BSC: Min A (steadying Pt. > 75%);4-From toilet/BSC: Min A (steadying Pt. > 75%)  FIM - Bed/Chair Transfer Bed/Chair Transfer Assistive Devices: Copy: 5: Supine > Sit: Supervision (verbal cues/safety issues);5: Sit > Supine: Supervision (verbal cues/safety issues);5: Bed > Chair or W/C: Supervision (verbal cues/safety issues)  FIM - Locomotion: Wheelchair Distance: 100 (x 2 reps) Locomotion: Wheelchair: 2: Travels 50 - 149 ft with supervision, cueing or coaxing FIM - Locomotion: Ambulation Locomotion: Ambulation Assistive Devices: Administrator Ambulation/Gait  Assistance: 5: Supervision Locomotion: Ambulation: 2: Travels 50 - 149 ft with minimal assistance (Pt.>75%)  Comprehension Comprehension Mode: Auditory Comprehension: 4-Understands basic 75 - 89% of the time/requires cueing 10 - 24% of the time  Expression Expression Mode: Verbal Expression: 5-Expresses basic needs/ideas: With extra time/assistive device  Social Interaction Social Interaction: 5-Interacts appropriately 90% of the time - Needs monitoring or encouragement for participation or interaction.  Problem Solving Problem Solving: 5-Solves basic 90% of the time/requires cueing < 10% of the time  Memory Memory: 5-Recognizes or recalls 90% of the time/requires cueing < 10% of the time   Medical Problem List and Plan:  1. DVT Prophylaxis/Anticoagulation: Pharmaceutical: Lovenox  2. Pain Management: continue oxycodone prn. Resume neurontin for neuropathy, reduce dose secondary to tremors.  3. Anxiety disorder/Mood: continues to be anxious and disoriented.adjust pain meds LCSW to follow for evaluation and support. per fiancee drank about 4oz of alcohol per day (much last than >1/5 per day prior to CVA, also took 52m xanax twice a day for panic attacks.  Saw Mental health and was diagnosed with bipolar D/O, now switched to Klonopin will d/c librium,  reduce seroquel to limit somnolence 4. Neuropsych: This patient is not capable of making decisions on his own behalf.  5. HTN: Monitor with bid checks. Continue atenolol and lisinopril  6. DM type 2: Monitor BS with ac/hs checks. Discontinue glipizide and resume metformin.  resume 70/30 insulin as indicated. Use SSI for elevated BS.  7. ABLA: will recheck on Monday. Add iron supplement.  8. Hypokalemia: Likely dilutional. D/C IVF and supplement.  9.  EtOH abuse, protocol,add thiamine 10. 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

## 2013-09-16 NOTE — Progress Notes (Signed)
Social Work Patient ID: [name] [name], male   DOB: [birth year], 48 y.o.   MRN: [id number] Pt is unable to self propel a standard weight wheelchair but is able to self propel a lightweight wheelchair.  He also uses the wheelchair for self care tasks.

## 2013-09-16 NOTE — Progress Notes (Signed)
Physical Therapy Session Note  Patient Details  Name: [name] LauthRobert [name] MRN: [id number] Date of Birth: Mar 06, 1966  Today's Date: 09/16/2013 Time: [id number] Time Calculation (min): 25 min  Short Term Goals: Week 1:  PT Short Term Goal 1 (Week 1): Pt will transfer supine <> edge of bed with S. PT Short Term Goal 2 (Week 1): Pt will transfers w/c <> bed with S.  PT Short Term Goal 3 (Week 1): Pt will ambulate with rolling walker about 50 feet with min A.  PT Short Term Goal 4 (Week 1): Pt will ascend/descend 2 stairs with mod A.  PT Short Term Goal 5 (Week 1): Pt will propel w/c about 150 feet with B UEs and R LE S only.  Skilled Therapeutic Interventions/Progress Updates:   Pt received lying in bed, noted to be upset and stating "I've been crying."  Questioned pts on issues and pt states he had a friend that passed away either today or yesterday.  Consoled pt and discussed getting OOB to get mind off of friend.  He was agreeable.  Performed bed mobility at supervision level, assisted pt with donning limb protector.  Pt able to don R shoe on his own.  Performed stand pivot transfer with RW at supervision level.  Pt propelled w/c to/from gym at supervision level using BUEs to increase activity tolerance.  Performed 25' gait with RW at supervision level.  Worked on stair negotiation up/down 2 steps without handrails (pt states he only has one, however notes state 2), with RW.  Pt negotiated both stairs with RW down on floor at min assist level (+2 to steady RW).  Feel pt may be safer doing single step then sitting on chair, turning around then getting back up rather than doing two stairs, unless they will have two people assisting.  Pt returned to room and was left in w/c with quick release belt and all needs in reach.  Therapy Documentation Precautions:  Precautions Precautions: Fall Precaution Comments: Pt very confused, poor attn span, makes him a large fall risk. Restrictions Weight Bearing  Restrictions: Yes LLE Weight Bearing: Non weight bearing Other Position/Activity Restrictions: L BKA   Pain: Pain Assessment Pain Assessment: 0-10 Pain Score: 5  Pain Type: Surgical pain Pain Location: Leg Pain Orientation: Left Pain Descriptors / Indicators: Aching Pain Frequency: Constant Pain Onset: On-going Patients Stated Pain Goal: 4 Pain Intervention(s): Medication (See eMAR)   Locomotion : Ambulation Ambulation/Gait Assistance: 5: Supervision Wheelchair Mobility Distance: 150   See FIM for current functional status  Therapy/Group: Individual Therapy  [name], [name] 09/16/2013, 12:26 PM

## 2013-09-16 NOTE — Progress Notes (Signed)
Physical Therapy Session Note  Patient Details  Name: [name] LauthRobert Goehring MRN: [id number] Date of Birth: 10/02/1965  Today's Date: 09/16/2013 Time: 1416-1500 Time Calculation (min): 44 min  Short Term Goals: Week 1:  PT Short Term Goal 1 (Week 1): Pt will transfer supine <> edge of bed with S. PT Short Term Goal 2 (Week 1): Pt will transfers w/c <> bed with S.  PT Short Term Goal 3 (Week 1): Pt will ambulate with rolling walker about 50 feet with min A.  PT Short Term Goal 4 (Week 1): Pt will ascend/descend 2 stairs with mod A.  PT Short Term Goal 5 (Week 1): Pt will propel w/c about 150 feet with B UEs and R LE S only.  Skilled Therapeutic Interventions/Progress Updates:   Pt received sitting in w/c, reluctantly agreeable to therapy this afternoon.  Pt self propelled to/from gym >150' with BUEs at supervision level in controlled environment.  Once in therapy gym, worked on basketball shooting with BUEs while standing to work on dynamic standing balance.  Pt able to shoot ball without UE support on RW for approx 5-7 secs at min assist then needed to grab onto RW.  Performed x 10 mins with seated rest break in between.  While seated discussed pts limb protector and need to remove once in bed and also to check skin to ensure no areas of built up pressure.  Also re-wrapped pts limb with ace wrap to re-enforce "figure 8" technique.  Pt did not wish to continue with basketball, therefore pt self propelled down to family room and assisted pt with retrieving hot chocolate.  Propelled back towards room.  Ended session with w/c propulsion x 100' x 2 as fast as possible without rest break with BUEs for increased HR and activity tolerance and also 10 reps of chair push ups.  Pt transferred back to bed with RW at supervision level.  Left pt in bed with bed rails up and bed alarm set.  All needs in reach.   Therapy Documentation Precautions:  Precautions Precautions: Fall Precaution Comments: Pt very confused,  poor attn span, makes him a large fall risk. Restrictions Weight Bearing Restrictions: Yes LLE Weight Bearing: Non weight bearing Other Position/Activity Restrictions: L BKA General: Amount of Missed PT Time (min): 15 Minutes Missed Time Reason: Other (comment) (PT late from previous visit)   Pain: Pain Assessment Pain Assessment: 0-10 Pain Score: 10-Worst pain ever Pain Type: Surgical pain Pain Location: Leg Pain Orientation: Left Pain Descriptors / Indicators: Aching Pain Frequency: Constant Pain Onset: On-going Patients Stated Pain Goal: 4 Pain Intervention(s): Medication (See eMAR)   Locomotion : Ambulation Ambulation/Gait Assistance: 5: Supervision Wheelchair Mobility Distance: 150   See FIM for current functional status  Therapy/Group: Individual Therapy  [name], Earl Zellmer Ann 09/16/2013, 4:18 PM

## 2013-09-16 NOTE — Progress Notes (Signed)
Occupational Therapy Session Notes  Patient Details  Name: [name] LauthRobert Rhymes MRN: [id number] Date of Birth: 05/15/1966  Today's Date: 09/16/2013 Time: 0900-1000 and 100-145  Time Calculation (min): 60 min and 45 min  Short Term Goals: Week 1:  OT Short Term Goal 1 (Week 1): LTG=STG  Skilled Therapeutic Interventions/Progress Updates:  1)  Patient sleeping in bed upon arrival.  Engaged in self care retraining to include sponge bath at sink and dress.  Patient declined stand at sink to brush teeth due to pain.  Patient stood to wash peri area and buttocks w/ vcs to always keep one hand on a steady surface during standing balance tasks.  Patient requested back to bed due to left residual limb pain.  Removed Left limb brace/protector for patient to provide desensitization on residual limb.  Patient generally less anxious this session yet presents as sleepy, internally distracted, and continues to have bilateral hand/arm jerky movements and spilled a drink in his bed.  Reviewed plan for OT afternoon session and patient agreeable.  2)  Patient resting in w/c.  Patient unable to recall plan for session this afternoon which was to practice toilet transfers with 3n1 over commode and tub bench transfers in therapy apartment tub/shower combo.  Patient was close supervision to ambulate with RW to bathroom and perform toilet transfer without use of grab bars.  Patient declined to ambulate to therapy apartment therefore he propelled w/c then ambulated into bathroom to practice "dry run" tub bench transfer with close supervision.  Patient did not use grab bars for transfer.  Encouraged patient to perform lateral leans to wash buttocks initially.  Therapy Documentation Precautions:  Precautions Precautions: Fall Precaution Comments: Pt very confused, poor attn span, makes him a large fall risk. Restrictions Weight Bearing Restrictions: Yes LLE Weight Bearing: Non weight bearing Other Position/Activity  Restrictions: L BKA Pain: 1)  8/10 left residual limb, premedicated per patient, rest, repositioned, tapping.  2)  6/10 left residual limb, premedicated per patient, rest, repositioned, tapping. ADL: See FIM for current functional status  Therapy/Group: Individual Therapy both sessions  Mersadez Linden 09/16/2013, 2:57 PM

## 2013-09-17 ENCOUNTER — Inpatient Hospital Stay (HOSPITAL_COMMUNITY): Payer: Medicaid Other

## 2013-09-17 DIAGNOSIS — D62 Acute posthemorrhagic anemia: Secondary | ICD-10-CM

## 2013-09-17 DIAGNOSIS — S88119A Complete traumatic amputation at level between knee and ankle, unspecified lower leg, initial encounter: Secondary | ICD-10-CM

## 2013-09-17 DIAGNOSIS — I739 Peripheral vascular disease, unspecified: Secondary | ICD-10-CM

## 2013-09-17 DIAGNOSIS — L98499 Non-pressure chronic ulcer of skin of other sites with unspecified severity: Secondary | ICD-10-CM

## 2013-09-17 DIAGNOSIS — I10 Essential (primary) hypertension: Secondary | ICD-10-CM

## 2013-09-17 DIAGNOSIS — E119 Type 2 diabetes mellitus without complications: Secondary | ICD-10-CM

## 2013-09-17 LAB — GLUCOSE, CAPILLARY
Glucose-Capillary: 148 mg/dL — ABNORMAL HIGH (ref 70–99)
Glucose-Capillary: 127 mg/dL — ABNORMAL HIGH (ref 70–99)
Glucose-Capillary: 119 mg/dL — ABNORMAL HIGH (ref 70–99)
Glucose-Capillary: 129 mg/dL — ABNORMAL HIGH (ref 70–99)

## 2013-09-17 MED ORDER — OXYCODONE HCL 5 MG PO TABS
20.0000 mg | ORAL_TABLET | ORAL | Status: DC | PRN
  Administered 2013-09-17 – 2013-09-19 (×12): 20 mg via ORAL
  Filled 2013-09-17 (×12): qty 4

## 2013-09-17 NOTE — Progress Notes (Addendum)
Appreciate psych note 48 y.o. male with h/o HTN, DM, tobacco abuse, ASPVD s/p L-CEA and aortobifemoral BG; who was admitted on 08/30/13 with cold, pulseless, painful left foot. CTA with occluded ABF and patient underwent bilateral iliac thrombectomy, left femoral endarterectomy with left to right femoral-femoral bypass, left popliteal and tibial embolectomy and LLE four compartment fasciotomy by Dr. Darrick Penna on 08/31/13. Post op with ABLA requiring transfusion, continued pain as well as confusion (due to PCA?). 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 Darrick Penna  Subjective/Complaints: Had a bad night due to increased pain in stump.  Review of Systems - Negative except somnolent  Objective: Vital Signs: Blood pressure 120/70, pulse 78, temperature 98.6 F (37 C), temperature source Oral, resp. rate 20, height 6\' 2"  (1.88 m), weight 99.383 kg (219 lb 1.6 oz), SpO2 99.00%. No results found. Results for orders placed during the hospital encounter of 09/09/13 (from the past 72 hour(s))  GLUCOSE, CAPILLARY     Status: Abnormal   Collection Time    09/14/13 11:17 AM      Result Value Ref Range   Glucose-Capillary 140 (*) 70 - 99 mg/dL   Comment 1 Notify RN    GLUCOSE, CAPILLARY     Status: Abnormal   Collection Time    09/14/13  5:13 PM      Result Value Ref Range   Glucose-Capillary 137 (*) 70 - 99 mg/dL   Comment 1 Notify RN    GLUCOSE, CAPILLARY     Status: Abnormal   Collection Time    09/14/13  8:59 PM      Result Value Ref Range   Glucose-Capillary 156 (*) 70 - 99 mg/dL  GLUCOSE, CAPILLARY     Status: Abnormal   Collection Time    09/15/13  7:15 AM      Result Value Ref Range   Glucose-Capillary 141 (*) 70 - 99 mg/dL   Comment 1 Notify RN    GLUCOSE, CAPILLARY     Status: Abnormal   Collection Time    09/15/13 12:00 PM      Result Value Ref Range   Glucose-Capillary 127 (*) 70 - 99 mg/dL  GLUCOSE, CAPILLARY     Status: Abnormal   Collection Time    09/15/13  5:06 PM       Result Value Ref Range   Glucose-Capillary 124 (*) 70 - 99 mg/dL  GLUCOSE, CAPILLARY     Status: Abnormal   Collection Time    09/15/13  9:26 PM      Result Value Ref Range   Glucose-Capillary 148 (*) 70 - 99 mg/dL   Comment 1 STAT Lab     Comment 2 Notify RN    GLUCOSE, CAPILLARY     Status: Abnormal   Collection Time    09/16/13  7:26 AM      Result Value Ref Range   Glucose-Capillary 209 (*) 70 - 99 mg/dL   Comment 1 Notify RN    GLUCOSE, CAPILLARY     Status: Abnormal   Collection Time    09/16/13 11:39 AM      Result Value Ref Range   Glucose-Capillary 119 (*) 70 - 99 mg/dL   Comment 1 Notify RN    GLUCOSE, CAPILLARY     Status: Abnormal   Collection Time    09/16/13  4:49 PM      Result Value Ref Range   Glucose-Capillary 147 (*) 70 - 99 mg/dL  GLUCOSE, CAPILLARY  Status: Abnormal   Collection Time    09/16/13  9:10 PM      Result Value Ref Range   Glucose-Capillary 140 (*) 70 - 99 mg/dL  GLUCOSE, CAPILLARY     Status: Abnormal   Collection Time    09/17/13  7:25 AM      Result Value Ref Range   Glucose-Capillary 148 (*) 70 - 99 mg/dL   Comment 1 Notify RN       HEENT: normal and poor dentition Cardio: RRR and no murmur Resp: CTA B/L GI: BS positive and non distended Extremity:  Left stump edema, min serosang drainage Skin:   Wound with superficial breakdown and serosang drainage along lateral aspect. Neuro: Alert/Oriented, Anxious and Abnormal Motor 3-/ LHF, will not flex/ext knee on L due to pain Musc/Skel:  Other upper ext and RLE full ROM without pain Gen anxious, continues to c/o of severe stump pain   Assessment/Plan: 1. Functional deficits secondary to Left BKA secondary to PAD which require 3+ hours per day of interdisciplinary therapy in a comprehensive inpatient rehab setting. Physiatrist is providing close team supervision and 24 hour management of active medical problems listed below. Physiatrist and rehab team continue to assess barriers  to discharge/monitor patient progress toward functional and medical goals. .Plan D/C monday FIM: FIM - Bathing Bathing Steps Patient Completed: Chest;Right Arm;Left Arm;Abdomen;Right upper leg;Right lower leg (including foot);Front perineal area;Buttocks Bathing: 5: Supervision: Safety issues/verbal cues  FIM - Upper Body Dressing/Undressing Upper body dressing/undressing steps patient completed: Thread/unthread right sleeve of pullover shirt/dresss;Thread/unthread left sleeve of pullover shirt/dress;Put head through opening of pull over shirt/dress;Pull shirt over trunk Upper body dressing/undressing: 5: Set-up assist to: Obtain clothing/put away FIM - Lower Body Dressing/Undressing Lower body dressing/undressing steps patient completed: Don/Doff right sock;Pull pants up/down;Thread/unthread right pants leg;Thread/unthread left pants leg;Thread/unthread right underwear leg;Thread/unthread left underwear leg;Pull underwear up/down;Don/Doff right shoe Lower body dressing/undressing: 5: Supervision: Safety issues/verbal cues  FIM - Toileting Toileting steps completed by patient: Adjust clothing prior to toileting;Performs perineal hygiene;Adjust clothing after toileting Toileting Assistive Devices: Grab bar or rail for support Toileting: 5: Supervision: Safety issues/verbal cues  FIM - Diplomatic Services operational officer Devices:  (3 in 1 over commode) Toilet Transfers: 5-To toilet/BSC: Supervision (verbal cues/safety issues);5-From toilet/BSC: Supervision (verbal cues/safety issues)  FIM - Banker Devices: Therapist, occupational: 5: Supine > Sit: Supervision (verbal cues/safety issues);5: Bed > Chair or W/C: Supervision (verbal cues/safety issues);4: Chair or W/C > Bed: Min A (steadying Pt. > 75%)  FIM - Locomotion: Wheelchair Distance: 150 Locomotion: Wheelchair: 5: Travels 150 ft or more: maneuvers on rugs and over door sills with  supervision, cueing or coaxing FIM - Locomotion: Ambulation Locomotion: Ambulation Assistive Devices: Designer, industrial/product Ambulation/Gait Assistance: 5: Supervision Locomotion: Ambulation: 1: Travels less than 50 ft with supervision/safety issues  Comprehension Comprehension Mode: Auditory Comprehension: 4-Understands basic 75 - 89% of the time/requires cueing 10 - 24% of the time  Expression Expression Mode: Verbal Expression: 5-Expresses basic 90% of the time/requires cueing < 10% of the time.  Social Interaction Social Interaction: 5-Interacts appropriately 90% of the time - Needs monitoring or encouragement for participation or interaction.  Problem Solving Problem Solving: 5-Solves complex 90% of the time/cues < 10% of the time  Memory Memory: 5-Requires cues to use assistive device   Medical Problem List and Plan:  1. DVT Prophylaxis/Anticoagulation: Pharmaceutical: Lovenox  2. Pain Management: continue oxycodone prn. Resume neurontin for neuropathy, reduce dose secondary to tremors.  -  hesitate to add further narcotics/pain meds given psych/sedation issues. Anxiety is a big part of this   -on similar dose of oxycodone at home PTA 3. Anxiety disorder/Mood: continues to be anxious and disoriented.adjust pain meds LCSW to follow for evaluation and support. per fiancee drank about 4oz of alcohol per day (much last than >1/5 per day prior to CVA, also took 1mg  xanax twice a day for panic attacks.  Saw Mental health and was diagnosed with bipolar D/O, now switched to Klonopin will d/c librium, reduced seroquel to decrease somnolence  -consider titrating klonopin up slightly 4. Neuropsych: This patient is not capable of making decisions on his own behalf.  5. HTN: Monitor with bid checks. Continue atenolol and lisinopril  6. DM type 2: Monitor BS with ac/hs checks. Discontinue glipizide and resume metformin.  resume 70/30 insulin as indicated. Use SSI for elevated BS.  7. ABLA: will  recheck on Monday. Add iron supplement.  8. Hypokalemia: Likely dilutional. D/C IVF and supplement.  9.  EtOH abuse, protocol,add thiamine 10. 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 Wound: lateral edge with some breakdown and drainage. Limb extremely tender  -hold limb guard for now, continue kerlix and ACE  -have asked surgery to come by and take a look  -reviewed situation/plan with wife    LOS (Days) 8 A FACE TO FACE EVALUATION WAS PERFORMED  Kojo Liby T 09/17/2013, 7:39 AM

## 2013-09-17 NOTE — Plan of Care (Signed)
Problem: RH PAIN MANAGEMENT Goal: RH STG PAIN MANAGED AT OR BELOW PT'S PAIN GOAL <3 on a 0-10 scale  Outcome: Not Progressing Rates pain at 7/10

## 2013-09-17 NOTE — Progress Notes (Signed)
   VASCULAR PROGRESS NOTE  SUBJECTIVE: Mild pain L BKA stump  PHYSICAL EXAM: Filed Vitals:   09/15/13 2115 09/16/13 0607 09/16/13 1600 09/17/13 0614  BP: 115/72 122/72 129/80 120/70  Pulse:  83 88 78  Temp:  97.2 F (36.2 C) 98.5 F (36.9 C) 98.6 F (37 C)  TempSrc:  Oral Oral Oral  Resp:  18 18 20   Height:      Weight:      SpO2:  96% 94% 99%   One area on lateral aspect of L BKA which is soft with a small amount of drainage The rest is intact  LABS: Lab Results  Component Value Date   WBC 10.1 09/13/2013   HGB 8.3* 09/13/2013   HCT 24.9* 09/13/2013   MCV 82.7 09/13/2013   PLT 507* 09/13/2013   Lab Results  Component Value Date   CREATININE 1.03 09/14/2013   Lab Results  Component Value Date   INR 1.14 09/05/2013   CBG (last 3)   Recent Labs  09/16/13 1649 09/16/13 2110 09/17/13 0725  GLUCAP 147* 140* 148*    Principal Problem:   Unilateral complete BKA Active Problems:   Nicotine addiction   Anxiety   Diabetes mellitus   Confusion, postoperative   ASSESSMENT AND PLAN:  * POD 12 L BKA  *  Continue dressing changes  * on po Keflex   Cari CarawayChris Brittnay Pigman Beeper: [phone] 09/17/2013

## 2013-09-18 LAB — GLUCOSE, CAPILLARY
GLUCOSE-CAPILLARY: 104 mg/dL — AB (ref 70–99)
GLUCOSE-CAPILLARY: 147 mg/dL — AB (ref 70–99)
Glucose-Capillary: 132 mg/dL — ABNORMAL HIGH (ref 70–99)
Glucose-Capillary: 157 mg/dL — ABNORMAL HIGH (ref 70–99)

## 2013-09-18 LAB — CULTURE, BLOOD (ROUTINE X 2)
CULTURE: NO GROWTH
Culture: NO GROWTH

## 2013-09-18 NOTE — Progress Notes (Signed)
Rested good last night. Complained of phantom pain. PRN oxy ir given at 2218 and 0220. Complained of itching, PRN benadryl 25mg  given at 2133. Girlfriend at bedside, providing hands on assistance, changing dressings. Ace wrap CD&I to left stump. Alfredo MartinezMurray, Layal Javid A

## 2013-09-18 NOTE — Progress Notes (Signed)
Appreciate psych note 48 y.o. male with h/o HTN, DM, tobacco abuse, ASPVD s/p L-CEA and aortobifemoral BG; who was admitted on 08/30/13 with cold, pulseless, painful left foot. CTA with occluded ABF and patient underwent bilateral iliac thrombectomy, left femoral endarterectomy with left to right femoral-femoral bypass, left popliteal and tibial embolectomy and LLE four compartment fasciotomy by Dr. Darrick Penna on 08/31/13. Post op with ABLA requiring transfusion, continued pain as well as confusion (due to PCA?). 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 Darrick Penna  Subjective/Complaints: Pain a little better last night and today, still uncomfortable  Review of Systems - Negative except somnolent  Objective: Vital Signs: Blood pressure 101/58, pulse 80, temperature 98.1 F (36.7 C), temperature source Oral, resp. rate 18, height 6\' 2"  (1.88 m), weight 99.383 kg (219 lb 1.6 oz), SpO2 100.00%. No results found. Results for orders placed during the hospital encounter of 09/09/13 (from the past 72 hour(s))  GLUCOSE, CAPILLARY     Status: Abnormal   Collection Time    09/15/13 12:00 PM      Result Value Ref Range   Glucose-Capillary 127 (*) 70 - 99 mg/dL  GLUCOSE, CAPILLARY     Status: Abnormal   Collection Time    09/15/13  5:06 PM      Result Value Ref Range   Glucose-Capillary 124 (*) 70 - 99 mg/dL  GLUCOSE, CAPILLARY     Status: Abnormal   Collection Time    09/15/13  9:26 PM      Result Value Ref Range   Glucose-Capillary 148 (*) 70 - 99 mg/dL   Comment 1 STAT Lab     Comment 2 Notify RN    GLUCOSE, CAPILLARY     Status: Abnormal   Collection Time    09/16/13  7:26 AM      Result Value Ref Range   Glucose-Capillary 209 (*) 70 - 99 mg/dL   Comment 1 Notify RN    GLUCOSE, CAPILLARY     Status: Abnormal   Collection Time    09/16/13 11:39 AM      Result Value Ref Range   Glucose-Capillary 119 (*) 70 - 99 mg/dL   Comment 1 Notify RN    GLUCOSE, CAPILLARY     Status: Abnormal    Collection Time    09/16/13  4:49 PM      Result Value Ref Range   Glucose-Capillary 147 (*) 70 - 99 mg/dL  GLUCOSE, CAPILLARY     Status: Abnormal   Collection Time    09/16/13  9:10 PM      Result Value Ref Range   Glucose-Capillary 140 (*) 70 - 99 mg/dL  GLUCOSE, CAPILLARY     Status: Abnormal   Collection Time    09/17/13  7:25 AM      Result Value Ref Range   Glucose-Capillary 148 (*) 70 - 99 mg/dL   Comment 1 Notify RN    GLUCOSE, CAPILLARY     Status: Abnormal   Collection Time    09/17/13 11:40 AM      Result Value Ref Range   Glucose-Capillary 127 (*) 70 - 99 mg/dL   Comment 1 Notify RN    GLUCOSE, CAPILLARY     Status: Abnormal   Collection Time    09/17/13  4:43 PM      Result Value Ref Range   Glucose-Capillary 119 (*) 70 - 99 mg/dL   Comment 1 Notify RN    GLUCOSE, CAPILLARY  Status: Abnormal   Collection Time    09/17/13  8:35 PM      Result Value Ref Range   Glucose-Capillary 129 (*) 70 - 99 mg/dL  GLUCOSE, CAPILLARY     Status: Abnormal   Collection Time    09/18/13  7:31 AM      Result Value Ref Range   Glucose-Capillary 104 (*) 70 - 99 mg/dL     HEENT: normal and poor dentition Cardio: RRR and no murmur Resp: CTA B/L GI: BS positive and non distended Extremity:  Left stump edema, min serosang drainage Skin:   Wound with superficial breakdown and serosang drainage along lateral aspect. Neuro: Alert/Oriented, Anxious and Abnormal Motor 3-/ LHF, will not flex/ext knee on L due to pain Musc/Skel:  Other upper ext and RLE full ROM without pain Gen anxious, continues to c/o of severe stump pain   Assessment/Plan: 1. Functional deficits secondary to Left BKA secondary to PAD which require 3+ hours per day of interdisciplinary therapy in a comprehensive inpatient rehab setting. Physiatrist is providing close team supervision and 24 hour management of active medical problems listed below. Physiatrist and rehab team continue to assess barriers to  discharge/monitor patient progress toward functional and medical goals.  Dc home tomorrow   FIM: FIM - Bathing Bathing Steps Patient Completed: Chest;Right Arm;Left Arm;Abdomen;Right upper leg;Right lower leg (including foot);Front perineal area;Buttocks Bathing: 5: Supervision: Safety issues/verbal cues  FIM - Upper Body Dressing/Undressing Upper body dressing/undressing steps patient completed: Thread/unthread right sleeve of pullover shirt/dresss;Thread/unthread left sleeve of pullover shirt/dress;Put head through opening of pull over shirt/dress;Pull shirt over trunk Upper body dressing/undressing: 5: Set-up assist to: Obtain clothing/put away FIM - Lower Body Dressing/Undressing Lower body dressing/undressing steps patient completed: Don/Doff right sock;Pull pants up/down;Thread/unthread right pants leg;Thread/unthread left pants leg;Thread/unthread right underwear leg;Thread/unthread left underwear leg;Pull underwear up/down;Don/Doff right shoe Lower body dressing/undressing: 5: Supervision: Safety issues/verbal cues  FIM - Toileting Toileting steps completed by patient: Adjust clothing prior to toileting;Performs perineal hygiene;Adjust clothing after toileting Toileting Assistive Devices: Grab bar or rail for support Toileting: 5: Supervision: Safety issues/verbal cues  FIM - Diplomatic Services operational officer Devices:  (3 in 1 over commode) Toilet Transfers: 5-To toilet/BSC: Supervision (verbal cues/safety issues);5-From toilet/BSC: Supervision (verbal cues/safety issues)  FIM - Banker Devices: Therapist, occupational: 5: Supine > Sit: Supervision (verbal cues/safety issues);5: Bed > Chair or W/C: Supervision (verbal cues/safety issues);4: Chair or W/C > Bed: Min A (steadying Pt. > 75%)  FIM - Locomotion: Wheelchair Distance: 150 Locomotion: Wheelchair: 5: Travels 150 ft or more: maneuvers on rugs and over door sills with  supervision, cueing or coaxing FIM - Locomotion: Ambulation Locomotion: Ambulation Assistive Devices: Designer, industrial/product Ambulation/Gait Assistance: 5: Supervision Locomotion: Ambulation: 1: Travels less than 50 ft with supervision/safety issues  Comprehension Comprehension Mode: Auditory Comprehension: 4-Understands basic 75 - 89% of the time/requires cueing 10 - 24% of the time  Expression Expression Mode: Verbal Expression: 5-Expresses complex 90% of the time/cues < 10% of the time  Social Interaction Social Interaction: 5-Interacts appropriately 90% of the time - Needs monitoring or encouragement for participation or interaction.  Problem Solving Problem Solving: 5-Solves complex 90% of the time/cues < 10% of the time  Memory Memory: 5-Requires cues to use assistive device   Medical Problem List and Plan:  1. DVT Prophylaxis/Anticoagulation: Pharmaceutical: Lovenox  2. Pain Management: continue oxycodone prn. Resume neurontin for neuropathy, reduce dose secondary to tremors.  -hesitate to add further narcotics/pain  meds given psych/sedation issues.   -have increased oxycodone to 20mg  q4 prn for breakthrough pain, can stagger tylenol in between  -psych factors are big here also. He/wife need to work on Therapist, artdistraction, leisure activities as well. 3. Anxiety disorder/Mood: continues to be anxious and disoriented.adjust pain meds LCSW to follow for evaluation and support. per fiancee drank about 4oz of alcohol per day (much last than >1/5 per day prior to CVA, also took 1mg  xanax twice a day for panic attacks.  Saw Mental health and was diagnosed with bipolar D/O, now switched to Klonopin will d/c librium, reduced seroquel to decrease somnolence  -consider titrating klonopin up slightly 4. Neuropsych: This patient is not capable of making decisions on his own behalf.  5. HTN: Monitor with bid checks. Continue atenolol and lisinopril  6. DM type 2: Monitor BS with ac/hs checks.  Discontinue glipizide and resume metformin.   -on lantus insulin qhs  -reasonable control at present 7. ABLA: will recheck on Monday.   iron supplement.  8. Hypokalemia:  . resolved 9.  EtOH abuse, protocol,  thiamine 10. 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 Wound: lateral edge with some breakdown and drainage. Limb extremely tender  -hold limb guard for now, continue kerlix and ACE  -surgery following along  -reviewed situation/plan with wife again today  -keflex   LOS (Days) 9 A FACE TO FACE EVALUATION WAS PERFORMED  Dabney Dever T 09/18/2013, 7:50 AM

## 2013-09-18 NOTE — Progress Notes (Signed)
Social Work Patient ID: [name] [name], male   DOB: [birth year], 48 y.o.   MRN: [id number]  Late entry for team conference follow up on 09-14-13.  CSW spoke with pt and girlfriend about team conference discussion and that pt is targeted for d/c on 09-20-13.  Girlfriend has already gone to mental health in [facility] and started the process of getting pt back involved there.  Informed her that psychiatry would be seeing pt here, as well, to get things started for pt.  She was appreciative of this.  Psychiatry has visited since that time and made medication recommendations.  CSW gave girlfriend a letter for her to have for a future employer and DSS stating what pt's needs would be post d/c.  Pt will need to f/u with mental health and primary MD to make sure mental health needs are met.  CSW will continue to discuss this with pt/girlfriend.  Pt now may be able to d/c after therapies on Monday.  CSW to make necessary arrangements.

## 2013-09-18 NOTE — Patient Care Conference (Deleted)
Inpatient RehabilitationTeam Conference and Plan of Care Update Date: 09/14/2013   Time: 11:30 AM    Patient Name: [name] LauthRobert [name]      Medical Record Number: [id number]  Date of Birth: [birth year] Sex: Male         Room/Bed: 4M01C/4M01C-01 Payor Info: Payor: MEDICAID [place] / Plan: MEDICAID OF [place] / Product Type: *No Product type* /    Admitting Diagnosis: L BKA HX ETOH  Admit Date/Time:  09/09/2013  2:11 PM Admission Comments: No comment available   Primary Diagnosis:  Unilateral complete BKA Principal Problem: Unilateral complete BKA  Patient Active Problem List   Diagnosis Date Noted  . Confusion, postoperative 09/16/2013  . Unilateral complete BKA 09/09/2013  . Ischemic leg 08/30/2013  . Incisional hernia without mention of obstruction or gangrene 04/06/2013  . PAD (peripheral artery disease) 03/30/2013  . Peripheral vascular disease, unspecified 01/22/2012  . Diabetes mellitus 01/16/2012  . PVD (peripheral vascular disease) 01/13/2012  . Uncontrolled hypertension   . GERD (gastroesophageal reflux disease)   . Nicotine addiction   . Anxiety   . Occlusion and stenosis of carotid artery without mention of cerebral infarction 10/02/2011  . Atherosclerosis of native arteries of the extremities with intermittent claudication 10/02/2011  . Claudication 09/04/2011  . Stroke 08/11/2011    Expected Discharge Date: Expected Discharge Date: 09/20/13  Team Members Present: Physician leading conference: Dr. Claudette LawsAndrew Kirsteins Social Worker Present: Staci AcostaJenny Dacoda Finlay, LCSW Nurse Present: Kennyth ArnoldStacey Jennings, RN PT Present: Edman CircleAudra Hall, Clearnce HastenPT;Emily Parcell, PT OT Present: Mackie PaiKaren Pulaski, OT;James Dorthea CoveMcGuire, OT;Sarah Hoxie, OT SLP Present: Fae PippinMelissa Bowie, SLP PPS Coordinator present : Tora DuckMarie Noel, RN, CRRN     Current Status/Progress Goal Weekly Team Focus  Medical   Cognitive deficits, psychiatric issues  Stabilize mental status to allow for participation rehabilitation  Psychiatry evaluation, medication  adjustment   Bowel/Bladder   continent Bowel and bladder LBM 09/13/13  min assist  continue to monitor   Swallow/Nutrition/ Hydration             ADL's   according to FIM - max assist bathing, min assist UB dressing, min assist toilet transfers  mod I - supervision  increased participation in treatment sessions, transfers, self-care retraining   Mobility   Pt is currently min assist for stand pivot transfers, min assist for gail with RW, min assist to min/guard for standing balance. continues to have severe pain, however also demonstrates increased confusion during sessions. Unsure of true baseline.  supervision to mod I (min assist for stairs)  transfers, balance, gait, stairs, pt/family education   Communication             Safety/Cognition/ Behavioral Observations  min assist  no unsafe behavior  continue to monitor   Pain   Robaxin 750mg  QID scheduled/Oxycodone 5-10mg  q4hrs prn/rates pain 5-9  min assist  monitor pain q shift   Skin   min assist  no skin breakdown this admission  skin assessment q shift    Rehab Goals Patient on target to meet rehab goals: Yes Rehab Goals Revised: None *See Care Plan and progress notes for long and short-term goals.  Barriers to Discharge: See above    Possible Resolutions to Barriers:  See above    Discharge Planning/Teaching Needs:  Pt to return to his apartment with his girlfriend.  Pt's girlfriend has been present for therapy.   Team Discussion:  Pt with previous bipolar disorder diagnosis, but has not been treated and is going through ETOH withdrawal.  Pt also with  a hx of previous CVA.    Revisions to Treatment Plan:  None   Continued Need for Acute Rehabilitation Level of Care: The patient requires daily medical management by a physician with specialized training in physical medicine and rehabilitation for the following conditions: Daily direction of a multidisciplinary physical rehabilitation program to ensure safe treatment  while eliciting the highest outcome that is of practical value to the patient.: Yes Daily medical management of patient stability for increased activity during participation in an intensive rehabilitation regime.: Yes Daily analysis of laboratory values and/or radiology reports with any subsequent need for medication adjustment of medical intervention for : Other;Post surgical problems  Jeronda Don, [place] De[place] 09/18/2013, 9:49 PM

## 2013-09-19 ENCOUNTER — Inpatient Hospital Stay (HOSPITAL_COMMUNITY): Payer: Medicaid Other | Admitting: Physical Therapy

## 2013-09-19 ENCOUNTER — Encounter (HOSPITAL_COMMUNITY): Payer: Medicaid Other | Admitting: Occupational Therapy

## 2013-09-19 DIAGNOSIS — S88119A Complete traumatic amputation at level between knee and ankle, unspecified lower leg, initial encounter: Secondary | ICD-10-CM

## 2013-09-19 DIAGNOSIS — L98499 Non-pressure chronic ulcer of skin of other sites with unspecified severity: Secondary | ICD-10-CM

## 2013-09-19 DIAGNOSIS — D62 Acute posthemorrhagic anemia: Secondary | ICD-10-CM

## 2013-09-19 DIAGNOSIS — I10 Essential (primary) hypertension: Secondary | ICD-10-CM

## 2013-09-19 DIAGNOSIS — I739 Peripheral vascular disease, unspecified: Secondary | ICD-10-CM

## 2013-09-19 DIAGNOSIS — E119 Type 2 diabetes mellitus without complications: Secondary | ICD-10-CM

## 2013-09-19 LAB — GLUCOSE, CAPILLARY
GLUCOSE-CAPILLARY: 95 mg/dL (ref 70–99)
GLUCOSE-CAPILLARY: 129 mg/dL — AB (ref 70–99)

## 2013-09-19 MED ORDER — NICOTINE 14 MG/24HR TD PT24: MEDICATED_PATCH | TRANSDERMAL | Status: DC

## 2013-09-19 MED ORDER — LISINOPRIL 20 MG PO TABS: 20.0000 mg | ORAL_TABLET | Freq: Two times a day (BID) | ORAL | Status: DC

## 2013-09-19 MED ORDER — HYDROCHLOROTHIAZIDE 25 MG PO TABS: 25.0000 mg | ORAL_TABLET | Freq: Every day | ORAL | Status: DC

## 2013-09-19 MED ORDER — AMPHETAMINE-DEXTROAMPHETAMINE 10 MG PO TABS: 10.0000 mg | ORAL_TABLET | Freq: Every day | ORAL | Status: DC

## 2013-09-19 MED ORDER — METFORMIN HCL 500 MG PO TABS: 500.0000 mg | ORAL_TABLET | Freq: Two times a day (BID) | ORAL | Status: DC

## 2013-09-19 MED ORDER — FLUOXETINE HCL 20 MG PO CAPS: 20.0000 mg | ORAL_CAPSULE | Freq: Every day | ORAL | Status: DC

## 2013-09-19 MED ORDER — METHOCARBAMOL 750 MG PO TABS: 750.0000 mg | ORAL_TABLET | Freq: Four times a day (QID) | ORAL | Status: DC

## 2013-09-19 MED ORDER — POLYSACCHARIDE IRON COMPLEX 150 MG PO CAPS: 150.0000 mg | ORAL_CAPSULE | Freq: Two times a day (BID) | ORAL | Status: DC

## 2013-09-19 MED ORDER — INSULIN GLARGINE 100 UNIT/ML ~~LOC~~ SOLN: 40.0000 [IU] | Freq: Every day | SUBCUTANEOUS | Status: DC

## 2013-09-19 MED ORDER — FAMOTIDINE 10 MG PO TABS: 10.0000 mg | ORAL_TABLET | Freq: Two times a day (BID) | ORAL | Status: DC

## 2013-09-19 MED ORDER — PANTOPRAZOLE SODIUM 40 MG PO TBEC: 40.0000 mg | DELAYED_RELEASE_TABLET | Freq: Every day | ORAL | Status: DC

## 2013-09-19 MED ORDER — GABAPENTIN 300 MG PO CAPS: 300.0000 mg | ORAL_CAPSULE | Freq: Three times a day (TID) | ORAL | Status: DC

## 2013-09-19 MED ORDER — CLONAZEPAM 1 MG PO TABS: 1.0000 mg | ORAL_TABLET | Freq: Two times a day (BID) | ORAL | Status: DC

## 2013-09-19 MED ORDER — POTASSIUM CHLORIDE ER 20 MEQ PO TBCR: 20.0000 meq | EXTENDED_RELEASE_TABLET | Freq: Every day | ORAL | Status: DC

## 2013-09-19 MED ORDER — ATENOLOL 100 MG PO TABS: 100.0000 mg | ORAL_TABLET | Freq: Every day | ORAL | Status: DC

## 2013-09-19 MED ORDER — QUETIAPINE FUMARATE 50 MG PO TABS: 50.0000 mg | ORAL_TABLET | Freq: Every day | ORAL | Status: DC

## 2013-09-19 MED ORDER — OXYCODONE HCL 20 MG PO TABS: 20.0000 mg | ORAL_TABLET | ORAL | Status: DC | PRN

## 2013-09-19 NOTE — Progress Notes (Signed)
Physical Therapy Note  Patient Details  Name: [name] LauthRobert Steines MRN: [id number] Date of Birth: June 26, 1966 Today's Date: 09/19/2013  Time: 1015-1045 30 minutes  1:1 No c/o pain, pt c/o fatigue.  Pt requires max encouragement from PT and fiancee to participate in PT session.  Pt able to perform bed mobility with mod I.  Pt/fiancee safely demo'd gait with RW 65' and functional transfers at supervision level.  Practiced with PT demo and pt/fiancee performed curb step x 3 as pt has step up onto porch and then step up into house.  Pt able to perform ascending backward, descending forward with fiancee providing min A.  Discussed car transfer with fiancee with PT demo as pt was unwilling to perform transfer to practice with fiancee.  Pt's fiancee is aware of safety concerns and reason for supervision with transfers.  Reviewed HEP handout and encouraged pt to perform at home.  Pt's fiancee states she feels comfortable taking pt home at this level of care.   Solymar Grace 09/19/2013, 10:51 AM

## 2013-09-19 NOTE — Plan of Care (Signed)
Problem: RH Balance Goal: LTG Patient will maintain dynamic standing with ADLs (OT) LTG: Patient will maintain dynamic standing balance with assist during activities of daily living (OT)  Outcome: Not Met (add Reason) Supervision for dynamic balance  Problem: RH Toilet Transfers Goal: LTG Patient will perform toilet transfers w/assist (OT) LTG: Patient will perform toilet transfers with assist, with/without cues using equipment (OT)  Outcome: Not Met (add Reason) Pt needs supervision for toilet transfers.  Problem: RH Furniture Transfers Goal: LTG Patient will perform furniture transfers w/assist (OT/PT LTG: Patient will perform furniture transfers with assistance (OT/PT).  Outcome: Not Met (add Reason) Pt needs min assist  Problem: RH Memory Goal: LTG Patient will demonstrate ability for day to day (OT) LTG: Patient will demonstrate ability for day to day recall/carryover during activities of daily living with assist (OT)  Outcome: Not Met (add Reason) Needs supervision and cueing for some ADLs.

## 2013-09-19 NOTE — Progress Notes (Signed)
 20mg  of OXY IR given at 2159 and 0418 in past 12 hours. Patient better managed at HS. Reporting stools hard-? Need to start stool softner. Wakes up to take meds , but falls right back to sleep. Girlfriend requesting Clonipin be held this AM, feels like making patient more groggy. Wife doing all dressing changes. [name] MartinezMurray, [name]

## 2013-09-19 NOTE — Discharge Instructions (Signed)
Inpatient Rehab Discharge Instructions  [name] LauthRobert [name] Discharge date and time:  09/19/13  Activities/Precautions/ Functional Status: Activity: activity as tolerated Diet: diabetic diet Wound Care:  Cleanse with betadine. Apply hydrogel and pack right groin with damp to dry dressing. Change dressing twice a day. Cleanse left amputation site with soap and water --then apply dry compressive dressing daily.   Functional status:  ___ No restrictions     ___ Walk up steps independently _X__ 24/7 supervision/assistance   ___ Walk up steps with assistance ___ Intermittent supervision/assistance  ___ Bathe/dress independently ___ Walk with walker     ___ Bathe/dress with assistance ___ Walk Independently    ___ Shower independently _X__ Walk with assistance    ___ Shower with assistance _X__ No alcohol     ___ Return to work/school ________  COMMUNITY REFERRALS UPON DISCHARGE:   Home Health:   PT   OT    RN  Agency:  [facility] Phone:  [phone] Medical Equipment/Items Ordered:  Wheelchair  18x18 left amputee pad with basic cushion; walker; 3-in-1 commode; tub bench  Agency/Supplier:  [facility]      Phone:  [phone]  GENERAL COMMUNITY RESOURCES FOR PATIENT/FAMILY: Support Groups:  [facility]  Special Instructions: 1. Check blood sugars twice a day. 2.  Contact Dr. Darrick PennaFields if you develop increase in drainage, fever, chills, purulent (pus) drainage  3. Need to follow up with Mental Health in [place] or [place].   My questions have been answered and I understand these instructions. I will adhere to these goals and the provided educational materials after my discharge from the hospital.  Patient/Caregiver Signature _______________________________ Date __________  Clinician Signature _______________________________________ Date __________  Please bring this form and your medication list with you to all your follow-up doctor's appointments.

## 2013-09-19 NOTE — Progress Notes (Signed)
Physical Therapy Discharge Summary  Patient Details  Name: [name] [name] MRN: [id number] Date of Birth: 1966-06-04  Today's Date: 09/19/2013 Time: 0300-9233 Time Calculation (min): 30 min  Patient has met 6 of 8 long term goals due to improved activity tolerance, improved balance, increased strength and ability to compensate for deficits.  Patient to discharge at an ambulatory level Supervision.   Patient's care partner is independent to provide the necessary physical and cognitive assistance at discharge.  Reasons goals not met: Pt requires supervision on previous day for car transfer, as pt was unwilling on day of D/C to practice car transfer with wife.  Also pt requires supervision for transfers for safety at time of D/c.   Recommendation:  Patient will benefit from ongoing skilled PT services in home health setting to continue to advance safe functional mobility, address ongoing impairments in balance, limb safety, overall safety awareness, pre-prosthetic training, and minimize fall risk.  Equipment: w/c, RW  Reasons for discharge: treatment goals met and discharge from hospital  Patient/family agrees with progress made and goals achieved: Yes  PT Discharge Precautions/Restrictions Restrictions Weight Bearing Restrictions: Yes LLE Weight Bearing: Non weight bearing Vital Signs Therapy Vitals BP: 112/55 mmHg Pain Pain Assessment Pain Assessment: 0-10 Pain Score: 7  Pain Type: Surgical pain Pain Location: Leg Pain Orientation: Left Pain Descriptors / Indicators: Aching Pain Frequency: Constant Pain Onset: On-going Patients Stated Pain Goal: 4 Pain Intervention(s): Medication (See eMAR) Vision/Perception     Cognition Orientation Level: Oriented to place;Oriented to person;Disoriented to time;Oriented to situation Sensation Sensation Light Touch: Appears Intact Proprioception: Appears Intact Coordination Gross Motor Movements are Fluid and Coordinated: Yes Motor   Motor Motor: Within Functional Limits  Mobility  See previous notes  Locomotion    See previous notes Trunk/Postural Assessment  Cervical Assessment Cervical Assessment: Within Functional Limits Thoracic Assessment Thoracic Assessment: Within Functional Limits Lumbar Assessment Lumbar Assessment: Within Functional Limits Postural Control Postural Control: Within Functional Limits  Balance Static Sitting Balance Static Sitting - Level of Assistance: 7: Independent Dynamic Standing Balance Dynamic Standing - Balance Support: During functional activity Dynamic Standing - Level of Assistance: 5: Stand by assistance Extremity Assessment      RLE Assessment RLE Assessment: Within Functional Limits LLE Assessment LLE Assessment: Within Functional Limits  See FIM for current functional status  DONAWERTH,KAREN 09/19/2013, 10:56 AM

## 2013-09-19 NOTE — Progress Notes (Signed)
[facility]  Subjective  - Going home   Objective 112/55 68 98.2 F (36.8 C) (Oral) 18 100%  Intake/Output Summary (Last 24 hours) at 09/19/13 1454 Last data filed at 09/19/13 0745  Gross per 24 hour  Intake    920 ml  Output    500 ml  Net    420 ml   Groin incisions slowly healing unchanged from 2 days ago Lateral aspect of BKA still marginal  Assessment/Planning: Pt has follow up with me in office this week.  Continue current wound care   Roizy Harold E 09/19/2013 2:54 PM --  Laboratory Lab Results: No results found for this basename: WBC, HGB, HCT, PLT,  in the last 72 hours BMET No results found for this basename: NA, K, CL, CO2, GLUCOSE, BUN, CREATININE, CALCIUM,  in the last 72 hours  COAG Lab Results  Component Value Date   INR 1.14 09/05/2013   INR 0.99 08/30/2013   INR 1.36 01/12/2012   No results found for this basename: PTT

## 2013-09-19 NOTE — Progress Notes (Signed)
Patient and caregiver received discharge instructions from Deatra Inaan Angiulli, PA with verbal understanding. Patient discharged to home with caregiver and belongings.

## 2013-09-19 NOTE — Plan of Care (Signed)
Problem: RH Bed to Chair Transfers Goal: LTG Patient will perform bed/chair transfers w/assist (PT) LTG: Patient will perform bed/chair transfers with assistance, with/without cues (PT).  Outcome: Not Met (add Reason) Pt requires supervision per last PT note for transfers  Problem: RH Car Transfers Goal: LTG Patient will perform car transfers with assist (PT) LTG: Patient will perform car transfers with assistance (PT).  Outcome: Not Met (add Reason) Pt requires supervision for car transfers on previous session and was unwilling to practice on day of D/C.

## 2013-09-19 NOTE — Progress Notes (Addendum)
Appreciate psych note 48 y.o. male with h/o HTN, DM, tobacco abuse, ASPVD s/p L-CEA and aortobifemoral BG; who was admitted on 08/30/13 with cold, pulseless, painful left foot. CTA with occluded ABF and patient underwent bilateral iliac thrombectomy, left femoral endarterectomy with left to right femoral-femoral bypass, left popliteal and tibial embolectomy and LLE four compartment fasciotomy by Dr. Darrick Penna on 08/31/13. Post op with ABLA requiring transfusion, continued pain as well as confusion (due to PCA?). 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 Darrick Penna  Subjective/Complaints: Slept well, fiancee doing wound care Review of Systems - Negative except somnolent  Objective: Vital Signs: Blood pressure 97/54, pulse 77, temperature 98.2 F (36.8 C), temperature source Oral, resp. rate 18, height 6\' 2"  (1.88 m), weight 99.383 kg (219 lb 1.6 oz), SpO2 100.00%. No results found. Results for orders placed during the hospital encounter of 09/09/13 (from the past 72 hour(s))  GLUCOSE, CAPILLARY     Status: Abnormal   Collection Time    09/16/13  7:26 AM      Result Value Ref Range   Glucose-Capillary 209 (*) 70 - 99 mg/dL   Comment 1 Notify RN    GLUCOSE, CAPILLARY     Status: Abnormal   Collection Time    09/16/13 11:39 AM      Result Value Ref Range   Glucose-Capillary 119 (*) 70 - 99 mg/dL   Comment 1 Notify RN    GLUCOSE, CAPILLARY     Status: Abnormal   Collection Time    09/16/13  4:49 PM      Result Value Ref Range   Glucose-Capillary 147 (*) 70 - 99 mg/dL  GLUCOSE, CAPILLARY     Status: Abnormal   Collection Time    09/16/13  9:10 PM      Result Value Ref Range   Glucose-Capillary 140 (*) 70 - 99 mg/dL  GLUCOSE, CAPILLARY     Status: Abnormal   Collection Time    09/17/13  7:25 AM      Result Value Ref Range   Glucose-Capillary 148 (*) 70 - 99 mg/dL   Comment 1 Notify RN    GLUCOSE, CAPILLARY     Status: Abnormal   Collection Time    09/17/13 11:40 AM      Result  Value Ref Range   Glucose-Capillary 127 (*) 70 - 99 mg/dL   Comment 1 Notify RN    GLUCOSE, CAPILLARY     Status: Abnormal   Collection Time    09/17/13  4:43 PM      Result Value Ref Range   Glucose-Capillary 119 (*) 70 - 99 mg/dL   Comment 1 Notify RN    GLUCOSE, CAPILLARY     Status: Abnormal   Collection Time    09/17/13  8:35 PM      Result Value Ref Range   Glucose-Capillary 129 (*) 70 - 99 mg/dL  GLUCOSE, CAPILLARY     Status: Abnormal   Collection Time    09/18/13  7:31 AM      Result Value Ref Range   Glucose-Capillary 104 (*) 70 - 99 mg/dL  GLUCOSE, CAPILLARY     Status: Abnormal   Collection Time    09/18/13 11:06 AM      Result Value Ref Range   Glucose-Capillary 147 (*) 70 - 99 mg/dL  GLUCOSE, CAPILLARY     Status: Abnormal   Collection Time    09/18/13  4:47 PM      Result Value  Ref Range   Glucose-Capillary 132 (*) 70 - 99 mg/dL  GLUCOSE, CAPILLARY     Status: Abnormal   Collection Time    09/18/13  8:51 PM      Result Value Ref Range   Glucose-Capillary 157 (*) 70 - 99 mg/dL   Comment 1 Notify RN       HEENT: normal and poor dentition Cardio: RRR and no murmur Resp: CTA B/L GI: BS positive and non distended Extremity:  Left stump edema, min serosang drainage Skin:   Wound with superficial breakdown and small amt (few mls) serosang drainage along lateral aspect. Neuro: Alert/Oriented, Anxious and Abnormal Motor 3-/ LHF, will not flex/ext knee on L due to pain Musc/Skel:  Other upper ext and RLE full ROM without pain Gen anxious, continues to c/o of severe stump pain   Assessment/Plan: 1. Functional deficits secondary to Left BKA secondary to PAD Stable for D/C today F/u PCP in 1 day F/u VVS in 3 days F/u PM&R 3 weeks See D/C summary See D/C instructions  FIM: FIM - Bathing Bathing Steps Patient Completed:  (wife gave a bath) Bathing: 5: Supervision: Safety issues/verbal cues  FIM - Upper Body Dressing/Undressing Upper body  dressing/undressing steps patient completed: Thread/unthread right sleeve of pullover shirt/dresss;Thread/unthread left sleeve of pullover shirt/dress;Put head through opening of pull over shirt/dress;Pull shirt over trunk Upper body dressing/undressing: 5: Set-up assist to: Obtain clothing/put away FIM - Lower Body Dressing/Undressing Lower body dressing/undressing steps patient completed: Don/Doff right sock;Pull pants up/down;Thread/unthread right pants leg;Thread/unthread left pants leg;Thread/unthread right underwear leg;Thread/unthread left underwear leg;Pull underwear up/down;Don/Doff right shoe Lower body dressing/undressing: 5: Supervision: Safety issues/verbal cues  FIM - Toileting Toileting steps completed by patient: Adjust clothing prior to toileting;Performs perineal hygiene;Adjust clothing after toileting Toileting Assistive Devices: Grab bar or rail for support Toileting: 5: Supervision: Safety issues/verbal cues  FIM - Diplomatic Services operational officer Devices:  (3 in 1 over commode) Toilet Transfers: 5-To toilet/BSC: Supervision (verbal cues/safety issues);5-From toilet/BSC: Supervision (verbal cues/safety issues)  FIM - Banker Devices: Therapist, occupational: 5: Supine > Sit: Supervision (verbal cues/safety issues);5: Bed > Chair or W/C: Supervision (verbal cues/safety issues);4: Chair or W/C > Bed: Min A (steadying Pt. > 75%)  FIM - Locomotion: Wheelchair Distance: 150 Locomotion: Wheelchair: 5: Travels 150 ft or more: maneuvers on rugs and over door sills with supervision, cueing or coaxing FIM - Locomotion: Ambulation Locomotion: Ambulation Assistive Devices: Designer, industrial/product Ambulation/Gait Assistance: 5: Supervision Locomotion: Ambulation: 1: Travels less than 50 ft with supervision/safety issues  Comprehension Comprehension Mode: Auditory Comprehension: 4-Understands basic 75 - 89% of the time/requires cueing  10 - 24% of the time  Expression Expression Mode: Verbal Expression: 5-Expresses complex 90% of the time/cues < 10% of the time  Social Interaction Social Interaction: 5-Interacts appropriately 90% of the time - Needs monitoring or encouragement for participation or interaction.  Problem Solving Problem Solving: 5-Solves complex 90% of the time/cues < 10% of the time  Memory Memory: 5-Requires cues to use assistive device   Medical Problem List and Plan:  1. DVT Prophylaxis/Anticoagulation: Pharmaceutical: Lovenox  2. Pain Management: continue oxycodone prn. Resume neurontin for neuropathy, reduce dose secondary to tremors.  -hesitate to add further narcotics/pain meds given psych/sedation issues.   -have increased oxycodone to 20mg  q4 prn for breakthrough pain, can stagger tylenol in between  -psych factors are big here also. He/wife need to work on Therapist, art, leisure activities as well. 3. Anxiety disorder/Mood: continues to  be anxious and disoriented.adjust pain meds LCSW to follow for evaluation and support. per fiancee drank about 4oz of alcohol per day (much last than >1/5 per day prior to CVA, also took 1mg  xanax twice a day for panic attacks.  Saw Mental health and was diagnosed with bipolar D/O, now switched to Klonopin will d/c librium, reduced seroquel to decrease somnolence  -consider titrating klonopin up slightly 4. Neuropsych: This patient is not capable of making decisions on his own behalf.  5. HTN: Monitor with bid checks. Continue atenolol and lisinopril  6. DM type 2: Monitor BS with ac/hs checks. Discontinue glipizide and resume metformin.   -on lantus insulin qhs  -reasonable control at present 7. ABLA: will recheck on Monday.   iron supplement.  8. Hypokalemia:  . resolved 9.  EtOH abuse, protocol,  thiamine 10. 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 Wound: lateral edge with some breakdown and drainage.had blister that popped Limb   tender  -hold limb guard for now, continue kerlix and ACE  -surgery followup as outpt in 3 days,   -reviewed situation/plan with wife again today  -keflex   LOS (Days) 10 A FACE TO FACE EVALUATION WAS PERFORMED  KIRSTEINS,ANDREW E 09/19/2013, 6:29 AM

## 2013-09-19 NOTE — Progress Notes (Signed)
Occupational Therapy Discharge Summary  Patient Details  Name: [name] [name] MRN: [id number] Date of Birth: 09/19/1965  Today's Date: 09/19/2013 Time: 0900-0959 Time Calculation (min): 59 min  Session Note:  Pt performed bathing and dressing sit to stand at the sink.  He needed min instructional cueing to make sure and lock the wheelchair brakes while working and prior to standing.  Overall modified independent for bathing and dressing sit to stand.  Progressed to education of tub bench transfers after completing dressing.  Pt's girlfriend also present and able to return demonstrate save assist with toilet and shower/tub transfers.  Discussed the need for them to remove all throw rugs in the house and to purchase a hand held shower if possible.  Pt overall supervision for the transfer in and out of the shower/tub.  Finished session by having pt work on simple snack prep from wheelchair level in the kitchen.  Pt able to use the countertops to move items around and stand from his wheelchair with modified independence to place items in the cabinet.  He was also able to open the refrigerator and place items inside as well.    Patient has met 9 of 13 long term goals due to improved balance and ability to compensate for deficits.  Patient to discharge at overall Supervision level.  Patient's care partner is independent to provide the necessary physical and cognitive assistance at discharge.    Reasons goals not met: Pt continues to needs supervision for all functional transfers, dynamic standing balance with selfcare tasks, and for memory issues.    Recommendation:  Patient will benefit from ongoing skilled OT services in home health setting to continue to advance functional skills in the area of BADL.  Pt continues to demonstrate decreased dynamic balance as well as decreased memory, processing, and safety.  Feel he needs continued OT to increase independence with functional selfcare related transfers as  well as for progressing of ADLs to a modified independent level.  Pt will have 24 hour supervision at discharge from his girlfriend.  Therapy has expressed the need for constant 24 hour supervision until he progress at home.    Equipment: tub bench, 3:1  Reasons for discharge: treatment goals met and discharge from hospital  Patient/family agrees with progress made and goals achieved: Yes  OT Discharge Precautions/Restrictions  Precautions Precautions: Fall Restrictions Weight Bearing Restrictions: No  Pain Pain Assessment Pain Assessment: 0-10 Pain Score: 10-Worst pain ever Pain Type: Phantom pain Pain Location: Foot Pain Orientation: Left Pain Descriptors / Indicators: Dull;Discomfort Pain Frequency: Constant Pain Onset: On-going Patients Stated Pain Goal: 4 Pain Intervention(s): Medication (See eMAR) ADL  See FIM scale  Vision/Perception  Vision - History Baseline Vision: Wears glasses only for reading Patient Visual Report: No change from baseline Vision - Assessment Eye Alignment: Within Functional Limits Vision Assessment: Vision not tested Perception Perception: Within Functional Limits Praxis Praxis: Intact  Cognition Overall Cognitive Status: Impaired/Different from baseline Orientation Level: Oriented to place;Oriented to situation Attention: Sustained Sustained Attention: Appears intact Memory: Impaired Memory Impairment: Storage deficit;Decreased short term memory Decreased Short Term Memory: Functional basic Awareness: Impaired Awareness Impairment: Anticipatory impairment Problem Solving: Impaired Problem Solving Impairment: Functional basic Decision Making: Impaired Decision Making Impairment: Functional basic Behaviors: Impulsive Safety/Judgment: Impaired Comments: Pt still needing min to mod instructional cueing to remember to lock his wheelchiar brakes and to push up from the arms of the wheelchair with both hands.    Sensation Sensation Light Touch: Appears Intact Stereognosis: Appears Intact Hot/Cold:   Appears Intact Proprioception: Appears Intact Coordination Gross Motor Movements are Fluid and Coordinated: Yes Fine Motor Movements are Fluid and Coordinated: Yes Motor  Motor Motor: Within Functional Limits Mobility  Bed Mobility Bed Mobility: Supine to Sit Supine to Sit: 6: Modified independent (Device/Increase time);HOB flat Supine to Sit Details: Visual cues/gestures for sequencing Transfers Transfers: Sit to Stand;Stand to Sit Sit to Stand: 6: Modified independent (Device/Increase time);With upper extremity assist;From chair/3-in-1 Stand to Sit: 6: Modified independent (Device/Increase time);To chair/3-in-1;With upper extremity assist  Trunk/Postural Assessment  Cervical Assessment Cervical Assessment: Within Functional Limits Thoracic Assessment Thoracic Assessment: Within Functional Limits Lumbar Assessment Lumbar Assessment: Within Functional Limits Postural Control Postural Control: Within Functional Limits  Balance Balance Balance Assessed: Yes Static Standing Balance Static Standing - Balance Support: Right upper extremity supported;Left upper extremity supported Static Standing - Level of Assistance: 6: Modified independent (Device/Increase time) Dynamic Standing Balance Dynamic Standing - Level of Assistance: 5: Stand by assistance Extremity/Trunk Assessment RUE Assessment RUE Assessment: Within Functional Limits LUE Assessment LUE Assessment: Within Functional Limits  See FIM for current functional status  , OTR/L 09/19/2013, 4:01 PM  

## 2013-09-20 ENCOUNTER — Telehealth: Payer: Self-pay

## 2013-09-20 NOTE — Progress Notes (Signed)
Social Work Discharge Note  The overall goal for the admission was met for:   Discharge location: Yes - home with significant other to provide supervision and assistance as needed  Length of Stay: Yes - 10 days  Discharge activity level: Yes - Supervision  Home/community participation: Yes  Services provided included: MD, RD, PT, OT, RN, TR, Pharmacy and [place]: Medicaid  Follow-up services arranged: Home Health: RN, PT, OT - through [facility]: 18x18 wheelchair with basic cushion, rolling walker, 3-in-1 commode, tub bench - through [facility], all but walker delivered to the home  and Patient/Family has no preference for HH/DME agencies  Comments (or additional information):  Patient/Family verbalized understanding of follow-up arrangements: Yes  Individual responsible for coordination of the follow-up plan:  Pt's significant other, Norma Fredrickson  Confirmed correct DME delivered: Trey Sailors 09/20/2013    Brendt Dible, Silvestre Mesi

## 2013-09-20 NOTE — Telephone Encounter (Signed)
Rec'd call from [place], pt's girlfriend.  [facility] to call the office for an antibiotic since there was odor noted, with the dressing change, to the left BKA stump incision, today.  Haywood LassoLynette reported there has been a bloody drainage from the incision (L) stump, but that the odor was new today.  Stated that Dr. Darrick PennaFields checked the left BKA stump incision yesterday, prior to pt. leaving the hospital.  Stated that the drainage doesn't look any different.  Denies any fever/ chills.  Has appt. on 09/22/13 with Dr. Darrick PennaFields.  Advised girlfriend to keep area clean/ dry, and change the bandage as needed.  Encouraged to keep appt. on 3/26, and to call office if symptoms worsen.  Will make Dr. Darrick PennaFields aware of new symptom reported of odorous drainage.

## 2013-09-20 NOTE — Discharge Summary (Signed)
Physician Discharge Summary  Patient ID: [name] [name] MRN: [id number] DOB/AGE: 1966/06/26 48 y.o.  Admit date: 09/09/2013 Discharge date: 09/19/2013  Discharge Diagnoses:  Principal Problem:   Unilateral complete BKA Active Problems:   Nicotine addiction   Anxiety   Diabetes mellitus   Confusion, postoperative   Discharged Condition: Stable   Labs:  Basic Metabolic Panel:  Recent Labs Lab 09/14/13 0613  NA 134*  K 4.7  CL 94*  CO2 25  GLUCOSE 136*  BUN 16  CREATININE 1.03  CALCIUM 8.3*    CBC:    Component Value Date/Time   WBC 10.1 09/13/2013 0455   RBC 3.01* 09/13/2013 0455   HGB 8.3* 09/13/2013 0455   HCT 24.9* 09/13/2013 0455   PLT 507* 09/13/2013 0455   MCV 82.7 09/13/2013 0455   MCH 27.6 09/13/2013 0455   MCHC 33.3 09/13/2013 0455   RDW 15.1 09/13/2013 0455   LYMPHSABS 1.9 09/13/2013 0455   MONOABS 0.9 09/13/2013 0455   EOSABS 0.2 09/13/2013 0455   BASOSABS 0.0 09/13/2013 0455      CBG:  Recent Labs Lab 09/18/13 1106 09/18/13 1647 09/18/13 2051 09/19/13 0732 09/19/13 1122  GLUCAP 147* 132* 157* 95 129*    Brief HPI:   [name] LauthRobert Corlew is a 48 y.o. male with h/o HTN, DM, tobacco abuse, ASPVD s/p L-CEA and aortobifemoral BG; who was admitted on 08/30/13 with cold, pulseless, painful left foot. CTA with occluded ABF and patient underwent bilateral iliac thrombectomy, left femoral endarterectomy with left to right femoral-femoral bypass, left popliteal and tibial embolectomy and LLE four compartment fasciotomy by Dr. Darrick PennaFields on 08/31/13. Post op with ABLA requiring transfusion, continued pain as well as confusion (due to PCA?). 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 Darrick PennaFields. Post op has continued to have confusion as well as issues with pain management. Was seen by [employer] for prosthetic education. Therapy ongoing and participation improving. CIR recommended by Rehab team and patient admitted today.   Hospital Course: [name] LauthRobert Valladares was admitted to  rehab 09/09/2013 for inpatient therapies to consist of PT, ST and OT at least three hours five days a week. Past admission physiatrist, therapy team and rehab RN have worked together to provide customized collaborative inpatient rehab. He continued to have intermittent fevers at admission and was noted to have right greater than left groin wound dehiscence. VVS was contacted for input and recommended wet to dry dressing changes. Blood cultures as well as wound cultures were negative. He was maintained on keflex for 10 total days of antibiotic therapy.  He continued to have bouts of confusion and hallucinations as well as issue with poor pain control. He was started on MS contin and narcotics were slowly titrated for pain control.  Patient has been educated on desensitization of L-BKA and neurontin was increased to help manage neuropathy.   His girlfriend did report history of alcohol abuse and he was stared on CIWA protocol to help with symptoms. Dr. Elsie SaasJonnalagadda was consulted for input on treatment of bipolar disorder as well as management of anxiety. He recommended addition of Seroquel at bedtime as well as changing xanax to klonopin to prevent rebound anxiety as well as changing Zoloft to Prozac. Mood and anxiety had greatly improved by discharge.  L-BKA incision has shown breakdown at lateral edge and limb guard was discontinued.  Blood sugars have improved and po intake has been good.  Follow up labs show ABLA is stable and hypokalemia has resolved. He has made good progress and  is at supervision level at discharge. He will continue to receive HHPT, HHOT as well as HHRN for wound care management past discharge.    Rehab course: During patient's stay in rehab weekly team conferences were held to monitor patient's progress, set goals and discuss barriers to discharge. Patient has had improvement in activity tolerance, balance, postural control, as well as ability to compensate for deficits. He is modified  independent for ADL tasks. He requires supervision for shower/tub transfers. He requires supervision for transfers. He is able to ambulate 150 feet with RW and supervision. Family education was done with girlfriend on all aspects of mobility and self care.    Disposition: 01-Home or Self Care   Diet: Diabetic.   Wound Care:  Cleanse with betadine. Apply hydrogel and pack right groin with damp to dry dressing. Change dressing twice a day. Cleanse left amputation site with soap and water --then apply dry compressive dressing daily.   Special Instructions: 1. Check blood sugars twice a day. 2.  Contact Dr. Darrick Penna if you develop increase in drainage, fever, chills, purulent (pus) drainage  3. Need to follow up with Mental Health in [place] or [place].      Future Appointments Provider Department Dept Phone   09/22/2013 1:30 PM [name], MD [facility] [phone]   10/05/2013 3:15 PM Storm Frisk, RD [facility] [phone]   10/13/2013 1:30 PM [facility] [place] [facility] [address] [phone]   10/13/2013 2:00 PM [facility] [place] [facility] [address] [phone]   10/13/2013 2:30 PM [facility] [place] [facility] [address] [phone]   10/13/2013 3:30 PM [name], MD [facility] [phone]   10/28/2013 10:00 AM Erick Colace, MD [facility] [phone]       Medication List    STOP taking these medications       ALPRAZolam 1 MG tablet  Commonly known as:  XANAX     cephALEXin 500 MG capsule  Commonly known as:  KEFLEX     CINNAMON PO     insulin NPH-regular Human (70-30) 100 UNIT/ML injection  Commonly known as:  NOVOLIN 70/30     oxyCODONE-acetaminophen 5-325 MG per tablet  Commonly known as:  PERCOCET/ROXICET     zolpidem 10 MG tablet  Commonly known as:  AMBIEN      TAKE these  medications       amphetamine-dextroamphetamine 10 MG tablet--Rx 30 pills   Commonly known as:  ADDERALL  Take 1 tablet (10 mg total) by mouth daily.     aspirin 325 MG EC tablet  Take 325 mg by mouth daily.     atenolol 100 MG tablet  Commonly known as:  TENORMIN  Take 1 tablet (100 mg total) by mouth daily.     clonazePAM 1 MG tablet  Commonly known as:  KLONOPIN  Take 1 tablet (1 mg total) by mouth 2 (two) times daily.     famotidine 10 MG tablet  Commonly known as:  PEPCID  Take 1 tablet (10 mg total) by mouth 2 (two) times daily.     FLUoxetine 20 MG capsule  Commonly known as:  PROZAC  Take 1 capsule (20 mg total) by mouth daily.     gabapentin 300 MG capsule  Commonly known as:  NEURONTIN  Take 1 capsule (300 mg total) by mouth 3 (three) times daily.  hydrochlorothiazide 25 MG tablet  Commonly known as:  HYDRODIURIL  Take 1 tablet (25 mg total) by mouth daily.     insulin glargine 100 UNIT/ML injection  Commonly known as:  LANTUS  Inject 0.4 mLs (40 Units total) into the skin at bedtime.     iron polysaccharides 150 MG capsule  Commonly known as:  NIFEREX  Take 1 capsule (150 mg total) by mouth 2 times daily at 12 noon and 4 pm.     lisinopril 20 MG tablet  Commonly known as:  PRINIVIL,ZESTRIL  Take 1 tablet (20 mg total) by mouth 2 (two) times daily.     metFORMIN 500 MG tablet  Commonly known as:  GLUCOPHAGE  Take 1 tablet (500 mg total) by mouth 2 (two) times daily with a meal.     methocarbamol 750 MG tablet  Commonly known as:  ROBAXIN  Take 1 tablet (750 mg total) by mouth 4 (four) times daily.     nicotine 14 mg/24hr patch  Commonly known as:  NICODERM CQ - dosed in mg/24 hours  14 mg patch daily x2 weeks then 7 mg patch daily x2 weeks and stop     Oxycodone HCl 20 MG Tabs---Rx 90 pills.   Take 1 tablet (20 mg total) by mouth every 4 (four) hours as needed for moderate pain.     Potassium Chloride ER 20 MEQ Tbcr  Take 20 mEq by mouth  daily.     QUEtiapine 50 MG tablet  Commonly known as:  SEROQUEL  Take 1 tablet (50 mg total) by mouth at bedtime.     VITAMIN B-12 PO  Take 1 tablet by mouth daily.           Follow-up Information   Follow up with Erick Colace, MD On 10/28/2013. (Be there at  9:30  for  10 am appointment)    Specialty:  Physical Medicine and Rehabilitation   Contact information:   [address] [place] [address] [phone]       Follow up with [name], MD On 10/03/2013. (Appointment at 3:30 pm.  Contact office for details on follow up. )    Specialty:  Vascular Surgery   Contact information:   [address] [place] [address] [phone]       Follow up with Myra Rude, MD On 09/20/2013. (already scheduled for the morning with Phineas Semen, PA)    Specialty:  [facility]   Contact information:   [address] [place] [address] [phone]       Signed: Jacquelynn Cree 09/20/2013, 10:38 AM

## 2013-09-21 ENCOUNTER — Encounter: Payer: Self-pay | Admitting: Vascular Surgery

## 2013-09-22 ENCOUNTER — Encounter: Payer: Self-pay | Admitting: Vascular Surgery

## 2013-09-22 ENCOUNTER — Ambulatory Visit (INDEPENDENT_AMBULATORY_CARE_PROVIDER_SITE_OTHER): Payer: Medicaid Other | Admitting: Vascular Surgery

## 2013-09-22 ENCOUNTER — Other Ambulatory Visit (HOSPITAL_COMMUNITY): Payer: Medicaid Other

## 2013-09-22 ENCOUNTER — Ambulatory Visit: Payer: Medicaid Other | Admitting: Vascular Surgery

## 2013-09-22 ENCOUNTER — Encounter (HOSPITAL_COMMUNITY): Payer: Medicaid Other

## 2013-09-22 VITALS — BP 93/57 | HR 72 | Temp 97.9°F | Ht 74.0 in | Wt 219.0 lb

## 2013-09-22 DIAGNOSIS — I739 Peripheral vascular disease, unspecified: Secondary | ICD-10-CM

## 2013-09-22 NOTE — Progress Notes (Signed)
Is a 48 year old male who previously had undergone aortobifemoral bypass grafting. He has also undergone previous carotid endarterectomy. He presented to the emergency room recently with an acute worsening of ischemia in his left leg. He ended up undergoing left to right femoral-femoral bypass and femoral endarterectomies. 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 He returns today for followup. He still has significant pain in the left below-knee amputation. He also has some phantom pain.  Physical exam:  Filed Vitals:   09/22/13 1404  BP: 93/57  Pulse: 72  Temp: 97.9 F (36.6 C)  TempSrc: Oral  Height: 6\' 2"  (1.88 m)  Weight: 219 lb (99.338 kg)  SpO2: 100%    Extremities: Right foot warm well perfused good Doppler signals right groin with 2 cm opening at the inferior aspect proximally 1 cm in depth with granulating tissue at the base some maceration of both groin incisions superficially but overall healing no significant drainage no erythema  Left below-knee amputation healing well except left lateral corner. 3-4 cm diameter segment on the corner of the below-knee amputation with macerated scan and some skin necrosis. Staples were removed from this area today and some sharp debridement performed.  Assessment: Patent femoral-femoral bypass slowly healing groin incisions. Left below-knee amputation still difficulty healing the lateral corner may require serial debridements still at risk for conversion to a left above-knee dictation.  Plan: Followup in 2 weeks. Wet to dry dressings for groins and below knee amputation  Fabienne Brunsharles Yitta Gongaware, MD [facility] [place] Office: [phone] Pager: [phone]

## 2013-09-26 ENCOUNTER — Telehealth: Payer: Self-pay

## 2013-09-26 NOTE — Telephone Encounter (Signed)
[name] [name] an occupational therapist with [facility] called to make us aware that patient refused services.

## 2013-09-27 ENCOUNTER — Telehealth: Payer: Self-pay

## 2013-09-27 NOTE — Telephone Encounter (Signed)
[facility]  Reports pt. has increasing foul odor from the wound of lateral left BKA ; reports the wound is very "mushy", and has slough in the wound bed, and has necrotic tissue present.  Reports measurements are 6 cm. long x 4 cm. Wide.; states the size is unchanged from the last measurements taken.  Asking about order for antibiotic, or Santyl oint for debridement.   Will report updated wound condition to Dr. Darrick PennaFields.

## 2013-09-28 NOTE — Telephone Encounter (Signed)
Left detailed message for Lynnette with appt date and time, dpm

## 2013-09-28 NOTE — Telephone Encounter (Signed)
[name] [name] is ok but he needs to see me tomorrow. He will probably need debridement next week. Notified Raynelle FanningJulie, [facility] of order for Santyl to wound qd, and of plan to bring pt. In for wound check tomorrow; left voice message with Dr. Darrick PennaFields recommendations.

## 2013-09-29 ENCOUNTER — Encounter: Payer: Self-pay | Admitting: Vascular Surgery

## 2013-09-29 ENCOUNTER — Ambulatory Visit (INDEPENDENT_AMBULATORY_CARE_PROVIDER_SITE_OTHER): Payer: Medicaid Other | Admitting: Vascular Surgery

## 2013-09-29 VITALS — BP 139/81 | HR 73 | Resp 18 | Ht 75.0 in | Wt 219.0 lb

## 2013-09-29 DIAGNOSIS — M79609 Pain in unspecified limb: Secondary | ICD-10-CM

## 2013-09-29 DIAGNOSIS — E119 Type 2 diabetes mellitus without complications: Secondary | ICD-10-CM

## 2013-09-29 DIAGNOSIS — K219 Gastro-esophageal reflux disease without esophagitis: Secondary | ICD-10-CM

## 2013-09-29 DIAGNOSIS — I739 Peripheral vascular disease, unspecified: Secondary | ICD-10-CM

## 2013-09-29 DIAGNOSIS — Z4789 Encounter for other orthopedic aftercare: Secondary | ICD-10-CM

## 2013-09-29 DIAGNOSIS — E785 Hyperlipidemia, unspecified: Secondary | ICD-10-CM

## 2013-09-29 DIAGNOSIS — I69922 Dysarthria following unspecified cerebrovascular disease: Secondary | ICD-10-CM

## 2013-09-29 DIAGNOSIS — I10 Essential (primary) hypertension: Secondary | ICD-10-CM

## 2013-09-29 DIAGNOSIS — Z48812 Encounter for surgical aftercare following surgery on the circulatory system: Secondary | ICD-10-CM

## 2013-09-29 DIAGNOSIS — Z4801 Encounter for change or removal of surgical wound dressing: Secondary | ICD-10-CM

## 2013-09-29 MED ORDER — OXYCODONE-ACETAMINOPHEN 5-325 MG PO TABS: 1.0000 | ORAL_TABLET | ORAL | Status: DC | PRN

## 2013-09-29 NOTE — Progress Notes (Signed)
Is a 48 year old male who previously had undergone aortobifemoral bypass grafting. He has also undergone previous carotid endarterectomy. He presented to the emergency room recently with an acute worsening of ischemia in his left leg. He ended up undergoing left to right femoral-femoral bypass and femoral endarterectomies. 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 He returns today for followup. He still has significant pain in the left below-knee amputation. He also has some phantom pain.   Physical exam:  Filed Vitals:   09/29/13 1237  BP: 139/81  Pulse: 73  Resp: 18  Height: 6\' 3"  (1.905 m)  Weight: 219 lb (99.338 kg)    Extremities: Palpable fem-fem graft pulse.  Left and right groin incisions essentially healed 1 cm area of slight skin maceration granulating bilateral groins.  Necrotic tissue lateral aspect left below-knee amputation debrided sharply in the office today.  Assessment:  Poorly healing left below-knee amputation will most likely require serial debridements hopefully will granulate over time but still at risk for above-knee amputation.  Groin incisions healing at this point no evidence of infection  Plan: Followup 2 weeks.  Patient given a prescription for Percocet #60 dispensed today.  Fabienne Brunsharles Elmyra Banwart, MD [facility] [place] Office: [phone] Pager: [phone]

## 2013-10-05 ENCOUNTER — Ambulatory Visit: Payer: Medicaid Other | Admitting: Dietician

## 2013-10-06 ENCOUNTER — Encounter (HOSPITAL_COMMUNITY): Payer: Medicaid Other

## 2013-10-06 ENCOUNTER — Other Ambulatory Visit (HOSPITAL_COMMUNITY): Payer: Medicaid Other

## 2013-10-06 ENCOUNTER — Encounter: Payer: Medicaid Other | Admitting: Vascular Surgery

## 2013-10-06 ENCOUNTER — Telehealth: Payer: Self-pay

## 2013-10-06 NOTE — Telephone Encounter (Signed)
Phone call from pt's friend, that provide care for him.  Requested to have pain medication changed from Oxycodone 5/325 mg to Oxycodone 15 mg IR tab.  Reported that the pt. Has had nausea and vomiting x 2 days.  Stated he isn't tolerating the Tylenol in the Percocet.  Stated she has been giving him Phenergan to help with the nausea.  Questioned if pt. has had any other symptoms; ie: fever/chills or diarrhea stools.  Denied any fever/ chills.  Reported that he takes stool softeners along with the pain medication, to prevent constipation; stated stools are soft, but not diarrhea consistency.  Discussed with Dr. Oneida Alar.  Declined changing the pain medication to Oxycodone 15 mg IR tabs.  Stated the pt. could have Phenergan supp. for nausea.  Ret'd call to [place], pt's friend.  Advised of Dr. Oneida Alar recommendations.  Stated she will call pt's PCP, to ask about getting the pain medication that he has been on in the past, as she has Phenergan for nausea.  Stated pt. Will keep appt. next Thurs., 4/16, with Dr. Oneida Alar.

## 2013-10-12 ENCOUNTER — Encounter: Payer: Self-pay | Admitting: Vascular Surgery

## 2013-10-13 ENCOUNTER — Encounter: Payer: Self-pay | Admitting: Vascular Surgery

## 2013-10-13 ENCOUNTER — Ambulatory Visit (INDEPENDENT_AMBULATORY_CARE_PROVIDER_SITE_OTHER)
Admit: 2013-10-13 | Discharge: 2013-10-13 | Disposition: A | Discharge status: Home / Self Care | Payer: Medicaid Other | Attending: Vascular Surgery | Admitting: Vascular Surgery

## 2013-10-13 ENCOUNTER — Ambulatory Visit (HOSPITAL_COMMUNITY)
Admit: 2013-10-13 | Discharge: 2013-10-13 | Disposition: A | Discharge status: Home / Self Care | Payer: Medicaid Other | Attending: Vascular Surgery | Admitting: Vascular Surgery

## 2013-10-13 ENCOUNTER — Ambulatory Visit (INDEPENDENT_AMBULATORY_CARE_PROVIDER_SITE_OTHER): Payer: Medicaid Other | Admitting: Vascular Surgery

## 2013-10-13 ENCOUNTER — Ambulatory Visit (HOSPITAL_COMMUNITY)
Admission: RE | Admit: 2013-10-13 | Discharge: 2013-10-13 | Disposition: A | Discharge status: Home / Self Care | Payer: Medicaid Other | Source: Ambulatory Visit | Attending: Vascular Surgery | Admitting: Vascular Surgery

## 2013-10-13 VITALS — BP 127/83 | HR 71 | Temp 97.8°F | Ht 75.0 in | Wt 201.2 lb

## 2013-10-13 DIAGNOSIS — I739 Peripheral vascular disease, unspecified: Secondary | ICD-10-CM

## 2013-10-13 DIAGNOSIS — Z48812 Encounter for surgical aftercare following surgery on the circulatory system: Secondary | ICD-10-CM

## 2013-10-13 DIAGNOSIS — T8130XA Disruption of wound, unspecified, initial encounter: Secondary | ICD-10-CM | POA: Insufficient documentation

## 2013-10-13 DIAGNOSIS — I6529 Occlusion and stenosis of unspecified carotid artery: Secondary | ICD-10-CM | POA: Insufficient documentation

## 2013-10-13 NOTE — Progress Notes (Signed)
48 year old male who previously had undergone aortobifemoral bypass grafting. He has also undergone previous carotid endarterectomy. He presented to the emergency room recently with an acute worsening of ischemia in his left leg. He ended up undergoing left to right femoral-femoral bypass and femoral endarterectomies. 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 He returns today for followup. He still has significant pain in the left below-knee amputation. He also has some phantom pain.   Physical exam:  Filed Vitals:   10/13/13 1556  BP: 127/83  Pulse: 71  Temp: 97.8 F (36.6 C)  TempSrc: Oral  Height: 6\' 3"  (1.905 m)  Weight: 201 lb 3.2 oz (91.264 kg)  SpO2: 100%   Extremities: Palpable fem-fem graft pulse.  Left and right groin incisions essentially healed 1 cm area of slight skin maceration granulating bilateral groins.  Necrotic tissue lateral aspect left below-knee amputation debrided sharply in the office today.  Assessment:  Poorly healing left below-knee amputation will most likely require serial debridements hopefully will granulate over time but still at risk for above-knee amputation.  Groin incisions healing at this point no evidence of infection  Plan: Followup 2 weeks.  Normal saline wet to dry dressings 3 times daily  Fabienne Brunsharles Fields, MD [facility] [place] Office: [phone] Pager: [phone]

## 2013-10-19 ENCOUNTER — Telehealth: Payer: Self-pay

## 2013-10-19 NOTE — Telephone Encounter (Signed)
Pt's significant other called to report pt. fell recently and injured the "shin area" of left LE.  Stated the pt. is c/o increased pain @ site of injury.  Denies any break in skin.  Reports swelling just below the left knee "on the shin bone".  Denies redness.  Denied any injury to the BKA stump incisional area.  Asking for "Oxycodone 5 mg prescription" until he can be seen.  Stated he is out of pain medication.  Stasia CavalierAdvised Lynette, that pt. needs to be evaluated from the fall.  Appt. given for 10/20/13 @ 10:00 AM.  Agrees with plan.

## 2013-10-20 ENCOUNTER — Encounter: Payer: Self-pay | Admitting: Family

## 2013-10-20 ENCOUNTER — Ambulatory Visit (INDEPENDENT_AMBULATORY_CARE_PROVIDER_SITE_OTHER): Payer: Medicaid Other | Admitting: Family

## 2013-10-20 VITALS — BP 119/72 | HR 75 | Temp 98.0°F | Resp 16 | Ht 72.0 in | Wt 201.0 lb

## 2013-10-20 DIAGNOSIS — Z5189 Encounter for other specified aftercare: Secondary | ICD-10-CM

## 2013-10-20 DIAGNOSIS — G8918 Other acute postprocedural pain: Secondary | ICD-10-CM

## 2013-10-20 MED ORDER — OXYCODONE-ACETAMINOPHEN 5-325 MG PO TABS: 1.0000 | ORAL_TABLET | ORAL | Status: DC | PRN

## 2013-10-20 NOTE — Patient Instructions (Addendum)
Amputation Many new amputations occur each year. The most common causes of amputation of the lower extremity (the hip down) are:  Disease.  Injury caused in an accidents or wars (trauma).  Birth defects.  Lumps (tumors) that are cancer. Upper extremity amputation is usually the result of trauma or birth defect, with disease being a less common cause. COMMON PROBLEMS After an amputation a number of issues need to be considered. Getting around and self-care are early problems that must be dealt with. A complete rehabilitation program will help the amputee recover mobility. A team approach of caregivers helps the most. Caregivers that can provide a well rounded program include:   Physicians.  Therapists.  Nurses.  Social workers.  Psychologists. Usually there are problems with body image and coping with lifestyle changes. A grieving period similar to dealing with a death in the family is common after an amputation. Talking to a trained professional with experience in treating people with similar problems can be very helpful. When returning to a previous lifestyle, questions about sexuality can arise. Many of these uncertainties are normal. These can be discussed with your psychologist or rehabilitation specialist. REHABILITATION AND RETURN TO WORK AND ACTIVITIES Returning to recreational activities and employment are part of recovery. Many times, changes to recreation equipment can allow return to a sport or hobby. A device that substitutes the missing part of the body is called a prosthetic. Many prosthetic manufacturers produce components designed for sports. Be sure to discuss all of your leisure interests with your prosthetist. This is the person who helps provide you with custom made replacement limbs. Your physician will also help to select a prosthetic that will meet your needs. Employers will vary in their willingness to change a work environment in order to help people with  disabilities. Your therapists can perform job site evaluations. Your therapist can then make recommendations to help with your work area. Some amputees will not be able to return to previous jobs. Your local Office of Vocational Rehabilitation can assist you in job retraining.  Once you are past the initial rehabilitation stage you will have ongoing contact with caregivers and a prosthetist. You need to work closely with them in making decisions about your prosthetic device. PROGNOSIS  Amputation should not end your joy of life. There are people with limb loss in nearly all walks of life. They are in a wide variety of professions. They participate in nearly all sports. Ask your caregivers about support groups and sports organizations in your area. They can help you with referral to organizations that will be helpful to you. Document Released: 03/08/2002 Document Revised: 09/08/2011 Document Reviewed: 05/03/2007 [facility] Patient Information 2014 [place], [place].  Smoking Cessation Quitting smoking is important to your health and has many advantages. However, it is not always easy to quit since nicotine is a very addictive drug. Often times, people try 3 times or more before being able to quit. This document explains the best ways for you to prepare to quit smoking. Quitting takes hard work and a lot of effort, but you can do it. ADVANTAGES OF QUITTING SMOKING  You will live longer, feel better, and live better.  Your body will feel the impact of quitting smoking almost immediately.  Within 20 minutes, blood pressure decreases. Your pulse returns to its normal level.  After 8 hours, carbon monoxide levels in the blood return to normal. Your oxygen level increases.  After 24 hours, the chance of having a heart attack starts to decrease.  Your breath, hair, and body stop smelling like smoke.  After 48 hours, damaged nerve endings begin to recover. Your sense of taste and smell improve.  After 72  hours, the body is virtually free of nicotine. Your bronchial tubes relax and breathing becomes easier.  After 2 to 12 weeks, lungs can hold more air. Exercise becomes easier and circulation improves.  The risk of having a heart attack, stroke, cancer, or lung disease is greatly reduced.  After 1 year, the risk of coronary heart disease is cut in half.  After 5 years, the risk of stroke falls to the same as a nonsmoker.  After 10 years, the risk of lung cancer is cut in half and the risk of other cancers decreases significantly.  After 15 years, the risk of coronary heart disease drops, usually to the level of a nonsmoker.  If you are pregnant, quitting smoking will improve your chances of having a healthy baby.  The people you live with, especially any children, will be healthier.  You will have extra money to spend on things other than cigarettes. QUESTIONS TO THINK ABOUT BEFORE ATTEMPTING TO QUIT You may want to talk about your answers with your caregiver.  Why do you want to quit?  If you tried to quit in the past, what helped and what did not?  What will be the most difficult situations for you after you quit? How will you plan to handle them?  Who can help you through the tough times? Your family? Friends? A caregiver?  What pleasures do you get from smoking? What ways can you still get pleasure if you quit? Here are some questions to ask your caregiver:  How can you help me to be successful at quitting?  What medicine do you think would be best for me and how should I take it?  What should I do if I need more help?  What is smoking withdrawal like? How can I get information on withdrawal? GET READY  Set a quit date.  Change your environment by getting rid of all cigarettes, ashtrays, matches, and lighters in your home, car, or work. Do not let people smoke in your home.  Review your past attempts to quit. Think about what worked and what did not. GET SUPPORT AND  ENCOURAGEMENT You have a better chance of being successful if you have help. You can get support in many ways.  Tell your family, friends, and co-workers that you are going to quit and need their support. Ask them not to smoke around you.  Get individual, group, or telephone counseling and support. Programs are available at [employer] and health centers. Call your local health department for information about programs in your area.  Spiritual beliefs and practices may help some smokers quit.  Download a "quit meter" on your computer to keep track of quit statistics, such as how long you have gone without smoking, cigarettes not smoked, and money saved.  Get a self-help book about quitting smoking and staying off of tobacco. LEARN NEW SKILLS AND BEHAVIORS  Distract yourself from urges to smoke. Talk to someone, go for a walk, or occupy your time with a task.  Change your normal routine. Take a different route to work. Drink tea instead of coffee. Eat breakfast in a different place.  Reduce your stress. Take a hot bath, exercise, or read a book.  Plan something enjoyable to do every day. Reward yourself for not smoking.  Explore interactive web-based programs that  specialize in helping you quit. GET MEDICINE AND USE IT CORRECTLY Medicines can help you stop smoking and decrease the urge to smoke. Combining medicine with the above behavioral methods and support can greatly increase your chances of successfully quitting smoking.  Nicotine replacement therapy helps deliver nicotine to your body without the negative effects and risks of smoking. Nicotine replacement therapy includes nicotine gum, lozenges, inhalers, nasal sprays, and skin patches. Some may be available over-the-counter and others require a prescription.  Antidepressant medicine helps people abstain from smoking, but how this works is unknown. This medicine is available by prescription.  Nicotinic receptor partial agonist  medicine simulates the effect of nicotine in your brain. This medicine is available by prescription. Ask your caregiver for advice about which medicines to use and how to use them based on your health history. Your caregiver will tell you what side effects to look out for if you choose to be on a medicine or therapy. Carefully read the information on the package. Do not use any other product containing nicotine while using a nicotine replacement product.  RELAPSE OR DIFFICULT SITUATIONS Most relapses occur within the first 3 months after quitting. Do not be discouraged if you start smoking again. Remember, most people try several times before finally quitting. You may have symptoms of withdrawal because your body is used to nicotine. You may crave cigarettes, be irritable, feel very hungry, cough often, get headaches, or have difficulty concentrating. The withdrawal symptoms are only temporary. They are strongest when you first quit, but they will go away within 10 14 days. To reduce the chances of relapse, try to:  Avoid drinking alcohol. Drinking lowers your chances of successfully quitting.  Reduce the amount of caffeine you consume. Once you quit smoking, the amount of caffeine in your body increases and can give you symptoms, such as a rapid heartbeat, sweating, and anxiety.  Avoid smokers because they can make you want to smoke.  Do not let weight gain distract you. Many smokers will gain weight when they quit, usually less than 10 pounds. Eat a healthy diet and stay active. You can always lose the weight gained after you quit.  Find ways to improve your mood other than smoking. FOR MORE INFORMATION  www.smokefree.gov  Document Released: 06/10/2001 Document Revised: 12/16/2011 Document Reviewed: 09/25/2011 [facility] Patient Information 2014 [place], [place].
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
## 2013-10-26 ENCOUNTER — Encounter: Payer: Self-pay | Admitting: Vascular Surgery

## 2013-10-26 ENCOUNTER — Telehealth: Payer: Self-pay | Admitting: *Deleted

## 2013-10-26 NOTE — Telephone Encounter (Signed)
[name] MaduroRobert has an appt Friday for hosp f/u.  He is seeing his surgeon who did the amputation every 2 weeks and his PCP every month, plus mental health services.  Do they reall need to come to this appt?

## 2013-10-27 ENCOUNTER — Encounter: Payer: Self-pay | Admitting: Vascular Surgery

## 2013-10-27 ENCOUNTER — Ambulatory Visit (INDEPENDENT_AMBULATORY_CARE_PROVIDER_SITE_OTHER): Payer: Medicaid Other | Admitting: Vascular Surgery

## 2013-10-27 VITALS — BP 170/87 | HR 59 | Temp 97.8°F | Ht 72.0 in | Wt 205.0 lb

## 2013-10-27 DIAGNOSIS — Z5189 Encounter for other specified aftercare: Secondary | ICD-10-CM

## 2013-10-27 MED ORDER — COLLAGENASE 250 UNIT/GM EX OINT: 1.0000 "application " | TOPICAL_OINTMENT | Freq: Every day | CUTANEOUS | Status: DC

## 2013-10-27 NOTE — Telephone Encounter (Signed)
Left a voicemail informing patient that his appt for 5/1 has been canceled. Per Dr. Wynn BankerKirsteins he can follow up with him PRN.

## 2013-10-27 NOTE — Telephone Encounter (Signed)
He can cancel and see me PRN

## 2013-10-27 NOTE — Progress Notes (Signed)
48 year old male who previously had undergone aortobifemoral bypass grafting 7/13 .  He presented to the emergency room recently with an acute worsening of ischemia in his left leg. He ended up undergoing left to right femoral-femoral 08/31/12 bypass and femoral endarterectomies. He eventually ended up with a left below-knee amputation 09/05/12. He returns today for followup. He still has significant pain in the left below-knee amputation. He also has some phantom pain. We have been doing serial debridements of a nonhealing wound of the lateral aspect of his left below-knee amputation. He is currently doing wet to dry saline dressings.  He has also undergone previous carotid endarterectomy 2/13.   Physical exam:     Filed Vitals:   10/27/13 1106  BP: 170/87  Pulse: 59  Temp: 97.8 F (36.6 C)  TempSrc: Oral  Height: 6' (1.829 m)  Weight: 205 lb (92.987 kg)  SpO2: 100%   Extremities: Palpable fem-fem graft pulse.  Left and right groin incisions essentially healed.  Necrotic tissue lateral aspect left below-knee amputation debrided sharply in the office today.  Assessment:  Poorly healing left below-knee amputation will most likely require serial debridements hopefully will granulate over time but still at risk for above-knee amputation.  Groin incisions healing at this point no evidence of infection  Plan: Followup 2-3 weeks.  Santyl to BKA wound daily.  Pain meds being managed by Dr Doroteo BradfordKirstein.  Still has some phantom pain.  Pt was advised to follow up with his primary MD for better BP control  Fabienne Brunsharles Kinzie Wickes, MD [facility] [place] Office: [phone] Pager: [phone]

## 2013-10-28 ENCOUNTER — Inpatient Hospital Stay: Payer: Medicaid Other | Admitting: Physical Medicine & Rehabilitation

## 2013-11-16 ENCOUNTER — Ambulatory Visit: Payer: Medicaid Other | Admitting: Dietician

## 2013-11-17 ENCOUNTER — Ambulatory Visit: Payer: Medicaid Other | Admitting: *Deleted

## 2013-11-22 ENCOUNTER — Ambulatory Visit: Payer: Self-pay | Admitting: Podiatrist

## 2013-11-23 ENCOUNTER — Encounter: Payer: Self-pay | Admitting: Vascular Surgery

## 2013-11-24 ENCOUNTER — Ambulatory Visit (INDEPENDENT_AMBULATORY_CARE_PROVIDER_SITE_OTHER): Payer: Medicaid Other | Admitting: Vascular Surgery

## 2013-11-24 ENCOUNTER — Encounter: Payer: Self-pay | Admitting: Vascular Surgery

## 2013-11-24 VITALS — BP 190/92 | HR 65 | Ht 72.0 in | Wt 211.0 lb

## 2013-11-24 DIAGNOSIS — T8131XA Disruption of external operation (surgical) wound, not elsewhere classified, initial encounter: Secondary | ICD-10-CM

## 2013-11-24 DIAGNOSIS — Z5189 Encounter for other specified aftercare: Secondary | ICD-10-CM

## 2013-11-24 DIAGNOSIS — T8130XA Disruption of wound, unspecified, initial encounter: Secondary | ICD-10-CM

## 2013-11-24 NOTE — Progress Notes (Signed)
[facility] [place]  Subjective  - Doing much better.   HPI:  He has under gone aortobifemoral bypass grafting and prior carotid endarterectomy.  He presented to the emergency room recently with an acute worsening of ischemia in his left leg. He ended up undergoing left to right femoral-femoral bypass and femoral endarterectomies. 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 He is here today for wound check of his left BKA.        Objective 190/92 65     100%  Left BKA incisional area lateral is healing well without eschar 2 x 2 centimeter opening with good granulation tissue at the base, abrasion on the dorsal aspect of the tibia The BKA stump is not painful to compression He has a superficial wound from a fall   Assessment/Planning: Poorly healing left below-knee amputation now healing well. They will continue wet to dry dressing changes on the lateral wound and dry dressing to the tibial tuberosity.   He was given a prescription for shrinker sock from [employer].  The patient was seen in conjunction with Dr. Darrick Penna. He will follow up in 1 month and have ABI's and an arterial duplex.   Lars Mage PA-C   Agree with above left below-knee amputation seems to be healing at this point. He will need followup graft duplex in 1 month.  Fabienne Bruns, MD [facility] [place] Office: [phone] Pager: [phone]

## 2013-12-06 ENCOUNTER — Ambulatory Visit: Payer: Self-pay | Admitting: Podiatrist

## 2013-12-16 DIAGNOSIS — E119 Type 2 diabetes mellitus without complications: Secondary | ICD-10-CM

## 2013-12-16 DIAGNOSIS — I10 Essential (primary) hypertension: Secondary | ICD-10-CM

## 2013-12-16 DIAGNOSIS — M79609 Pain in unspecified limb: Secondary | ICD-10-CM

## 2013-12-16 DIAGNOSIS — Z4789 Encounter for other orthopedic aftercare: Secondary | ICD-10-CM

## 2013-12-19 ENCOUNTER — Other Ambulatory Visit: Payer: Self-pay | Admitting: Vascular Surgery

## 2013-12-19 DIAGNOSIS — I739 Peripheral vascular disease, unspecified: Secondary | ICD-10-CM

## 2013-12-19 DIAGNOSIS — Z48812 Encounter for surgical aftercare following surgery on the circulatory system: Secondary | ICD-10-CM

## 2013-12-27 ENCOUNTER — Encounter: Payer: Self-pay | Admitting: Podiatrist

## 2013-12-27 ENCOUNTER — Ambulatory Visit (INDEPENDENT_AMBULATORY_CARE_PROVIDER_SITE_OTHER): Payer: Medicaid Other | Admitting: Podiatrist

## 2013-12-27 VITALS — BP 151/89 | HR 55 | Resp 18

## 2013-12-27 DIAGNOSIS — B351 Tinea unguium: Secondary | ICD-10-CM

## 2013-12-27 DIAGNOSIS — M79609 Pain in unspecified limb: Secondary | ICD-10-CM

## 2013-12-27 DIAGNOSIS — M79671 Pain in right foot: Secondary | ICD-10-CM

## 2013-12-27 DIAGNOSIS — E1159 Type 2 diabetes mellitus with other circulatory complications: Secondary | ICD-10-CM

## 2013-12-27 NOTE — Progress Notes (Signed)
   Subjective:    Patient ID: [name] [name], male    DOB: 08/11/1965, 48 y.o.   MRN: [id number]  HPI I HAVE BEEN A DIABETIC FOR ABOUT 2 YEARS AND HAD A STROKE IN 2013 AND AFFECTED MY RIGHT SIDE AND I HAVE NEUROPATHY AND NUMBNESS AND TINGLING AND THROBS AND SORE AND TENDER AND MY NAILS ARE THICK AND DISCOLORED AND I MY LEFT LEG AMPUTATED IN MARCH OF THIS YEAR DUE TO DIABETES     Review of Systems  Constitutional:       SWEATING  HENT: Positive for hearing loss.   Eyes: Positive for redness.  Cardiovascular:       CALF PAIN WITH WALKING  Musculoskeletal: Positive for back pain and gait problem.       MUSCLE PAIN  Neurological: Positive for numbness.  All other systems reviewed and are negative.      Objective:   Physical Exam  GENERAL APPEARANCE: Alert, conversant. Appropriately groomed. No acute distress. - left leg BK Amputation from march of 2015.    VASCULAR: Pedal pulses palpable at 2/4 DP and 1/4 PT right.  Capillary refill time is immediate to all digits,  Proximal to distal cooling it warm to warm.  Digital hair growth is present right foot NEUROLOGIC: sensation is intact epicritically and protectively to 5.07 monofilament at 5/5 sites   Light touch is intact , vibratory sensation intact right.   MUSCULOSKELETAL: acceptable muscle strength, tone and stability right.  Mild contracture of the right 2nd digit is present.  Rectus appearance of foot noted right.  DERMATOLOGIC: patients toenails are darkish discoloration, thick and dystrophic.  Subungual debris is present right.  No interdigital maceration present.  No pre ulcerative lesions right. bk amp left.      Assessment & Plan:  Diabetes with peripheral vascular disorder resulting in below-knee amputation left, symptomatic mycotic toenails right  Plan: Debrided the toenails of the right foot without complication. Recommended diabetic shoes and accommodative inserts. A [facility] was [facility]  as [facility] is [facility] he is going for the prosthetic for the left leg. He'll be seen back for routine care in 3 months.

## 2013-12-27 NOTE — Patient Instructions (Signed)
Diabetes and Foot Care Diabetes may cause you to have problems because of poor blood supply (circulation) to your feet and legs. This may cause the skin on your feet to become thinner, break easier, and heal more slowly. Your skin may become dry, and the skin may peel and crack. You may also have nerve damage in your legs and feet causing decreased feeling in them. You may not notice minor injuries to your feet that could lead to infections or more serious problems. Taking care of your feet is one of the most important things you can do for yourself.  HOME CARE INSTRUCTIONS  Wear shoes at all times, even in the house. Do not go barefoot. Bare feet are easily injured.  Check your feet daily for blisters, cuts, and redness. If you cannot see the bottom of your feet, use a mirror or ask someone for help.  Wash your feet with warm water (do not use hot water) and mild soap. Then pat your feet and the areas between your toes until they are completely dry. Do not soak your feet as this can dry your skin.  Apply a moisturizing lotion or petroleum jelly (that does not contain alcohol and is unscented) to the skin on your feet and to dry, brittle toenails. Do not apply lotion between your toes.  Trim your toenails straight across. Do not dig under them or around the cuticle. File the edges of your nails with an emery board or nail file.  Do not cut corns or calluses or try to remove them with medicine.  Wear clean socks or stockings every day. Make sure they are not too tight. Do not wear knee-high stockings since they may decrease blood flow to your legs.  Wear shoes that fit properly and have enough cushioning. To break in new shoes, wear them for just a few hours a day. This prevents you from injuring your feet. Always look in your shoes before you put them on to be sure there are no objects inside.  Do not cross your legs. This may decrease the blood flow to your feet.  If you find a minor scrape,  cut, or break in the skin on your feet, keep it and the skin around it clean and dry. These areas may be cleansed with mild soap and water. Do not cleanse the area with peroxide, alcohol, or iodine.  When you remove an adhesive bandage, be sure not to damage the skin around it.  If you have a wound, look at it several times a day to make sure it is healing.  Do not use heating pads or hot water bottles. They may burn your skin. If you have lost feeling in your feet or legs, you may not know it is happening until it is too late.  Make sure your health care provider performs a complete foot exam at least annually or more often if you have foot problems. Report any cuts, sores, or bruises to your health care provider immediately. SEEK MEDICAL CARE IF:   You have an injury that is not healing.  You have cuts or breaks in the skin.  You have an ingrown nail.  You notice redness on your legs or feet.  You feel burning or tingling in your legs or feet.  You have pain or cramps in your legs and feet.  Your legs or feet are numb.  Your feet always feel cold. SEEK IMMEDIATE MEDICAL CARE IF:   There is increasing redness,   swelling, or pain in or around a wound.  There is a red line that goes up your leg.  Pus is coming from a wound.  You develop a fever or as directed by your health care provider.  You notice a bad smell coming from an ulcer or wound. Document Released: 06/13/2000 Document Revised: 02/16/2013 Document Reviewed: 11/23/2012 Exit[employer] Patient Information 2015 [employer]. This information is not intended to replace advice given to you by your health care provider. Make sure you discuss any questions you have with your health care provider.  

## 2013-12-28 ENCOUNTER — Encounter: Payer: Self-pay | Admitting: Vascular Surgery

## 2013-12-29 ENCOUNTER — Other Ambulatory Visit (HOSPITAL_COMMUNITY): Payer: Self-pay

## 2013-12-29 ENCOUNTER — Encounter (HOSPITAL_COMMUNITY): Payer: Self-pay

## 2013-12-29 ENCOUNTER — Ambulatory Visit: Payer: Self-pay | Admitting: Vascular Surgery

## 2014-01-05 ENCOUNTER — Ambulatory Visit: Payer: Self-pay | Admitting: *Deleted

## 2014-01-16 ENCOUNTER — Encounter: Payer: Self-pay | Admitting: Vascular Surgery

## 2014-01-17 ENCOUNTER — Ambulatory Visit (INDEPENDENT_AMBULATORY_CARE_PROVIDER_SITE_OTHER): Payer: Medicaid Other | Admitting: Vascular Surgery

## 2014-01-17 ENCOUNTER — Encounter: Payer: Self-pay | Admitting: Vascular Surgery

## 2014-01-17 VITALS — BP 175/81 | HR 105 | Resp 18 | Ht 74.5 in | Wt 226.0 lb

## 2014-01-17 DIAGNOSIS — I739 Peripheral vascular disease, unspecified: Secondary | ICD-10-CM

## 2014-01-17 DIAGNOSIS — Z48812 Encounter for surgical aftercare following surgery on the circulatory system: Secondary | ICD-10-CM

## 2014-01-17 DIAGNOSIS — Z5189 Encounter for other specified aftercare: Secondary | ICD-10-CM

## 2014-01-17 DIAGNOSIS — I6529 Occlusion and stenosis of unspecified carotid artery: Secondary | ICD-10-CM

## 2014-01-17 NOTE — Progress Notes (Signed)
[facility]  Subjective  - Doing much better.   HPI:  He has under gone aortobifemoral bypass grafting and prior left carotid endarterectomy.  he has a known right chronic carotid occlusion. He presented to the emergency room recently with an acute worsening of ischemia in his left leg. He ended up undergoing left to right femoral-femoral bypass and femoral endarterectomies. 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 He is here today for wound check of his left BKA.        Objective Filed Vitals:   01/17/14 1035  BP: 175/81  Pulse: 105  Resp: 18  Height: 6' 2.5" (1.892 m)  Weight: 226 lb (102.513 kg)    Left BKA incisional area lateral is healing well without eschar 1x 1 centimeter opening with good granulation tissue at the base The BKA stump is not painful to compression 2+ right dorsalis pedis and posterior tibial pulse  Assessment/Planning: Essentially healed left below-knee amputation well-perfused right foot with palpable pulses  Followup graft scanning carotid duplex scan with ABIs in 6 months  Fabienne Brunsharles Fields, MD [facility] [place] Office: [phone] Pager: [phone]

## 2014-01-17 NOTE — Addendum Note (Signed)
Addended by: Sharee PimpleMCCHESNEY, Katee Wentland K on: 01/17/2014 03:54 PM   Modules accepted: Orders

## 2014-02-15 ENCOUNTER — Ambulatory Visit: Payer: Self-pay | Admitting: *Deleted

## 2014-03-09 ENCOUNTER — Ambulatory Visit: Payer: Medicaid Other | Admitting: Physical Therapy

## 2014-03-16 ENCOUNTER — Ambulatory Visit: Payer: Medicaid Other | Admitting: Physical Therapy

## 2014-03-28 ENCOUNTER — Ambulatory Visit: Payer: Medicaid Other | Attending: Vascular Surgery | Admitting: Physical Therapy

## 2014-03-28 DIAGNOSIS — M6281 Muscle weakness (generalized): Secondary | ICD-10-CM | POA: Diagnosis not present

## 2014-03-28 DIAGNOSIS — R5381 Other malaise: Secondary | ICD-10-CM | POA: Diagnosis not present

## 2014-03-28 DIAGNOSIS — IMO0001 Reserved for inherently not codable concepts without codable children: Secondary | ICD-10-CM | POA: Diagnosis present

## 2014-03-28 DIAGNOSIS — R269 Unspecified abnormalities of gait and mobility: Secondary | ICD-10-CM | POA: Insufficient documentation

## 2014-04-06 ENCOUNTER — Encounter: Payer: Self-pay | Admitting: Physical Therapy

## 2014-04-11 ENCOUNTER — Ambulatory Visit (INDEPENDENT_AMBULATORY_CARE_PROVIDER_SITE_OTHER): Payer: Medicaid Other | Admitting: Podiatrist

## 2014-04-11 DIAGNOSIS — M79676 Pain in unspecified toe(s): Secondary | ICD-10-CM

## 2014-04-11 DIAGNOSIS — M79674 Pain in right toe(s): Secondary | ICD-10-CM

## 2014-04-11 DIAGNOSIS — B351 Tinea unguium: Secondary | ICD-10-CM

## 2014-04-11 NOTE — Patient Instructions (Signed)
Try Edwena BundeKerasal or Funginail topical nail film for the darkening of the toenails on your foot.  You will apply it topically every day to the toenails

## 2014-04-11 NOTE — Progress Notes (Signed)
Subjective:  [name] presents today for continued diabetic foot care of the right foot.  The left foot was previously amputated and he uses a prosthesis.   Physical Exam  GENERAL APPEARANCE: Alert, conversant. Appropriately groomed. No acute distress. - left leg BK Amputation from march of 2015.  VASCULAR: Pedal pulses palpable at 2/4 DP and 1/4 PT right. Capillary refill time is immediate to all digits, Proximal to distal cooling it warm to warm. Digital hair growth is present right foot  NEUROLOGIC: sensation is intact epicritically and protectively to 5.07 monofilament at 5/5 sites Light touch is intact , vibratory sensation intact right.  MUSCULOSKELETAL: acceptable muscle strength, tone and stability right. Mild contracture of the right 2nd digit is present. Rectus appearance of foot noted right.  DERMATOLOGIC: patients toenails are darkish discoloration, thick and dystrophic. Subungual debris is present right. No interdigital maceration present. No pre ulcerative lesions right. bk amp left.  Assessment & Plan:   Diabetes with peripheral vascular disorder resulting in below-knee amputation left, symptomatic mycotic toenails right  Plan: Debrided the toenails of the right foot without complication. Recommended OTC topical nail film for the fungus. Return appointments recommended in 3 months.

## 2014-04-13 ENCOUNTER — Ambulatory Visit: Payer: Medicaid Other | Attending: Vascular Surgery | Admitting: Physical Therapy

## 2014-04-13 DIAGNOSIS — E119 Type 2 diabetes mellitus without complications: Secondary | ICD-10-CM | POA: Diagnosis not present

## 2014-04-13 DIAGNOSIS — I739 Peripheral vascular disease, unspecified: Secondary | ICD-10-CM | POA: Insufficient documentation

## 2014-04-13 DIAGNOSIS — Z9714 Presence of artificial left leg (complete) (partial): Secondary | ICD-10-CM | POA: Insufficient documentation

## 2014-04-13 DIAGNOSIS — R5381 Other malaise: Secondary | ICD-10-CM | POA: Diagnosis not present

## 2014-04-13 DIAGNOSIS — I10 Essential (primary) hypertension: Secondary | ICD-10-CM | POA: Diagnosis not present

## 2014-04-13 DIAGNOSIS — R269 Unspecified abnormalities of gait and mobility: Secondary | ICD-10-CM | POA: Insufficient documentation

## 2014-04-13 DIAGNOSIS — Z8673 Personal history of transient ischemic attack (TIA), and cerebral infarction without residual deficits: Secondary | ICD-10-CM | POA: Insufficient documentation

## 2014-04-13 DIAGNOSIS — M6281 Muscle weakness (generalized): Secondary | ICD-10-CM | POA: Diagnosis not present

## 2014-04-13 DIAGNOSIS — Z89512 Acquired absence of left leg below knee: Secondary | ICD-10-CM | POA: Insufficient documentation

## 2014-04-20 ENCOUNTER — Ambulatory Visit: Payer: Medicaid Other | Admitting: Physical Therapy

## 2014-04-27 ENCOUNTER — Ambulatory Visit: Payer: Medicaid Other | Admitting: Physical Therapy

## 2014-04-27 DIAGNOSIS — Z89512 Acquired absence of left leg below knee: Secondary | ICD-10-CM | POA: Diagnosis not present

## 2014-05-03 ENCOUNTER — Ambulatory Visit: Payer: Medicaid Other | Admitting: Physical Therapy

## 2014-05-11 ENCOUNTER — Ambulatory Visit: Payer: Medicaid Other | Admitting: Physical Therapy

## 2014-05-16 ENCOUNTER — Telehealth: Payer: Self-pay

## 2014-05-16 NOTE — Telephone Encounter (Signed)
Phone call from pt's significant other.  Reported that there is a bony prominence at the shin area of the Left BKA.  Stated the pt. Saw his PCP last week, and was told that the bone may need to be shaved-down, and to contact Dr. Darrick PennaFields.  Also stated pt. has been receiving Physical Therapy, and has been applying a steroid cream to the site.   Stated there is some redness beside area where the shin bone that is prominent, under the skin.  Stated the pt. is having a lot of pain @ this area.  Reported the stump incision looks okay.  Advised will call back to schedule an appt. for evaluation.  Agrees.

## 2014-05-17 ENCOUNTER — Encounter: Payer: Self-pay | Admitting: Vascular Surgery

## 2014-05-17 ENCOUNTER — Ambulatory Visit (INDEPENDENT_AMBULATORY_CARE_PROVIDER_SITE_OTHER): Payer: Medicaid Other | Admitting: Vascular Surgery

## 2014-05-17 VITALS — BP 127/78 | HR 76 | Resp 18 | Ht 74.0 in | Wt 214.0 lb

## 2014-05-17 DIAGNOSIS — T8789 Other complications of amputation stump: Secondary | ICD-10-CM

## 2014-05-17 DIAGNOSIS — M79605 Pain in left leg: Secondary | ICD-10-CM

## 2014-05-17 NOTE — Progress Notes (Signed)
Patient is a 48 year old male who presented today with complaints of pain over the tibial bony prominence of his left below-knee amputation. His amputation was done in March 2015. He currently is unable to wear his prosthetic due to pain over the tibia.  Review of systems: He denies fever or chills. He denies shortness of breath or chest pain.  Physical exam:  Filed Vitals:   05/17/14 1501  BP: 127/78  Pulse: 76  Resp: 18  Height: 6\' 2"  (1.88 m)  Weight: 214 lb (97.07 kg)    Left below-knee amputation: Well-healed incision some mild erythema in the skin just above the end of the tibia no fluctuance no drainage  Assessment: Painful left below-knee amputation  Plan: We will arrange appointment with prosthetist to see if they can change his prosthetic for a better fit and increased padding over the tibia to improve function and decreased pain. Would only consider revision of his below-knee amputation stump if all other measures fail due to the fact that if his revision did not heal he would be at risk for an above-knee amputation.  The patient has regular scheduled follow-up with me in a few months regarding his known carotid and aortoiliac occlusive disease.  He will call me if they are unable to get a good fit with his prosthetic.  Fabienne Brunsharles Fields, MD [facility] [place] Office: [phone] Pager: [phone]

## 2014-05-18 ENCOUNTER — Ambulatory Visit: Payer: Medicaid Other | Admitting: Physical Therapy

## 2014-06-01 ENCOUNTER — Ambulatory Visit: Payer: Medicaid Other | Admitting: Physical Therapy

## 2014-06-07 ENCOUNTER — Ambulatory Visit: Payer: Medicaid Other | Admitting: Physical Therapy

## 2014-06-13 ENCOUNTER — Encounter: Payer: Self-pay | Admitting: Physical Therapy

## 2014-06-13 ENCOUNTER — Ambulatory Visit: Payer: Medicaid Other | Attending: Vascular Surgery | Admitting: Physical Therapy

## 2014-06-13 DIAGNOSIS — E119 Type 2 diabetes mellitus without complications: Secondary | ICD-10-CM | POA: Diagnosis not present

## 2014-06-13 DIAGNOSIS — Z89512 Acquired absence of left leg below knee: Secondary | ICD-10-CM | POA: Insufficient documentation

## 2014-06-13 DIAGNOSIS — Z9714 Presence of artificial left leg (complete) (partial): Secondary | ICD-10-CM | POA: Diagnosis not present

## 2014-06-13 DIAGNOSIS — Z8673 Personal history of transient ischemic attack (TIA), and cerebral infarction without residual deficits: Secondary | ICD-10-CM | POA: Insufficient documentation

## 2014-06-13 DIAGNOSIS — R269 Unspecified abnormalities of gait and mobility: Secondary | ICD-10-CM | POA: Diagnosis not present

## 2014-06-13 DIAGNOSIS — I739 Peripheral vascular disease, unspecified: Secondary | ICD-10-CM | POA: Diagnosis not present

## 2014-06-13 DIAGNOSIS — R5381 Other malaise: Secondary | ICD-10-CM | POA: Diagnosis not present

## 2014-06-13 DIAGNOSIS — I10 Essential (primary) hypertension: Secondary | ICD-10-CM | POA: Insufficient documentation

## 2014-06-13 DIAGNOSIS — R6889 Other general symptoms and signs: Secondary | ICD-10-CM

## 2014-06-13 DIAGNOSIS — M25562 Pain in left knee: Secondary | ICD-10-CM

## 2014-06-13 DIAGNOSIS — M6281 Muscle weakness (generalized): Secondary | ICD-10-CM | POA: Diagnosis not present
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
## 2014-06-21 ENCOUNTER — Ambulatory Visit: Payer: Medicaid Other | Admitting: Physical Therapy

## 2014-07-05 ENCOUNTER — Ambulatory Visit: Payer: Medicaid Other | Attending: Vascular Surgery | Admitting: Physical Therapy

## 2014-07-05 DIAGNOSIS — Z89512 Acquired absence of left leg below knee: Secondary | ICD-10-CM | POA: Insufficient documentation

## 2014-07-05 DIAGNOSIS — Z8673 Personal history of transient ischemic attack (TIA), and cerebral infarction without residual deficits: Secondary | ICD-10-CM | POA: Insufficient documentation

## 2014-07-05 DIAGNOSIS — Z9714 Presence of artificial left leg (complete) (partial): Secondary | ICD-10-CM | POA: Insufficient documentation

## 2014-07-05 DIAGNOSIS — R5381 Other malaise: Secondary | ICD-10-CM | POA: Insufficient documentation

## 2014-07-05 DIAGNOSIS — I739 Peripheral vascular disease, unspecified: Secondary | ICD-10-CM | POA: Insufficient documentation

## 2014-07-05 DIAGNOSIS — R269 Unspecified abnormalities of gait and mobility: Secondary | ICD-10-CM | POA: Insufficient documentation

## 2014-07-05 DIAGNOSIS — E119 Type 2 diabetes mellitus without complications: Secondary | ICD-10-CM | POA: Insufficient documentation

## 2014-07-05 DIAGNOSIS — M6281 Muscle weakness (generalized): Secondary | ICD-10-CM | POA: Insufficient documentation

## 2014-07-05 DIAGNOSIS — I10 Essential (primary) hypertension: Secondary | ICD-10-CM | POA: Insufficient documentation

## 2014-07-12 ENCOUNTER — Ambulatory Visit: Payer: Medicaid Other | Admitting: Physical Therapy

## 2014-07-14 ENCOUNTER — Ambulatory Visit: Payer: Self-pay

## 2014-07-19 ENCOUNTER — Encounter: Payer: Self-pay | Admitting: Family

## 2014-07-19 ENCOUNTER — Ambulatory Visit: Payer: Medicaid Other | Admitting: Physical Therapy

## 2014-07-20 ENCOUNTER — Encounter (HOSPITAL_COMMUNITY): Payer: Self-pay

## 2014-07-20 ENCOUNTER — Other Ambulatory Visit (HOSPITAL_COMMUNITY): Payer: Self-pay

## 2014-07-20 ENCOUNTER — Ambulatory Visit: Payer: Self-pay | Admitting: Family

## 2014-07-26 ENCOUNTER — Ambulatory Visit: Payer: Medicaid Other | Admitting: Physical Therapy

## 2014-08-04 ENCOUNTER — Ambulatory Visit: Payer: Self-pay

## 2014-08-08 ENCOUNTER — Telehealth: Payer: Self-pay

## 2014-08-08 NOTE — Telephone Encounter (Addendum)
Phone call from significant other.  Reported the pt. Has been in alot of pain recently in both legs.  Reported he c/o "feeling something pop in his back recently, and since then, has had discomfort in the right leg, and down into the BKA stump of the left leg.  Reported that the bottoms of the stump gets cool at times.  Stated the coolness comes and goes.  Denies any discoloration of the left BKA stump.  Reported he had his left BKA prosthesis refitted, and the last time he wore it, in January, it caused a sore spot on the shin bone of the left leg.  Stated there is a very small sore with a dry scab on the left shin bone.  Voiced concern because "it won't heal."   Encouraged the Significant other to contact pt's. PCP regarding his back pain that goes down into his legs.  Advised will schedule pt. for his surveillance vascular studies and office visit, that he missed in January.  Verb. Understanding

## 2014-08-14 NOTE — Telephone Encounter (Signed)
I had attempted to call Mr [name] [name] to reschedule as per Carol's triage and left messages for [name] [name] at home #. I had not received a call back until today and Mr [name] [name] reported that his leg was cold. Our RN, Joyce GrossKay, spoke with [name] [name], pt sgo, and instructed Mr [name] [name] to go to the ER for evaluation. Lynette expressed understanding. dpm

## 2014-08-16 ENCOUNTER — Inpatient Hospital Stay (HOSPITAL_COMMUNITY): Payer: Medicaid Other

## 2014-08-16 ENCOUNTER — Inpatient Hospital Stay (HOSPITAL_COMMUNITY)
Admission: EM | Admit: 2014-08-16 | Discharge: 2014-08-24 | DRG: 474 | Disposition: A | Discharge status: Rehabilitation Facility | Payer: Medicaid Other | Attending: Vascular Surgery | Admitting: Vascular Surgery

## 2014-08-16 ENCOUNTER — Encounter (HOSPITAL_COMMUNITY): Payer: Self-pay | Admitting: Family Medicine

## 2014-08-16 DIAGNOSIS — Z79891 Long term (current) use of opiate analgesic: Secondary | ICD-10-CM

## 2014-08-16 DIAGNOSIS — I96 Gangrene, not elsewhere classified: Secondary | ICD-10-CM | POA: Diagnosis present

## 2014-08-16 DIAGNOSIS — F1010 Alcohol abuse, uncomplicated: Secondary | ICD-10-CM | POA: Diagnosis present

## 2014-08-16 DIAGNOSIS — Z79899 Other long term (current) drug therapy: Secondary | ICD-10-CM

## 2014-08-16 DIAGNOSIS — I10 Essential (primary) hypertension: Secondary | ICD-10-CM | POA: Diagnosis present

## 2014-08-16 DIAGNOSIS — Z794 Long term (current) use of insulin: Secondary | ICD-10-CM

## 2014-08-16 DIAGNOSIS — F319 Bipolar disorder, unspecified: Secondary | ICD-10-CM | POA: Diagnosis present

## 2014-08-16 DIAGNOSIS — E1165 Type 2 diabetes mellitus with hyperglycemia: Secondary | ICD-10-CM | POA: Diagnosis present

## 2014-08-16 DIAGNOSIS — E876 Hypokalemia: Secondary | ICD-10-CM | POA: Diagnosis present

## 2014-08-16 DIAGNOSIS — J811 Chronic pulmonary edema: Secondary | ICD-10-CM | POA: Insufficient documentation

## 2014-08-16 DIAGNOSIS — M79604 Pain in right leg: Secondary | ICD-10-CM | POA: Diagnosis present

## 2014-08-16 DIAGNOSIS — A419 Sepsis, unspecified organism: Secondary | ICD-10-CM | POA: Diagnosis not present

## 2014-08-16 DIAGNOSIS — I998 Other disorder of circulatory system: Secondary | ICD-10-CM | POA: Diagnosis present

## 2014-08-16 DIAGNOSIS — Z8673 Personal history of transient ischemic attack (TIA), and cerebral infarction without residual deficits: Secondary | ICD-10-CM | POA: Diagnosis not present

## 2014-08-16 DIAGNOSIS — I70203 Unspecified atherosclerosis of native arteries of extremities, bilateral legs: Secondary | ICD-10-CM

## 2014-08-16 DIAGNOSIS — T8753 Necrosis of amputation stump, right lower extremity: Principal | ICD-10-CM | POA: Diagnosis present

## 2014-08-16 DIAGNOSIS — F17210 Nicotine dependence, cigarettes, uncomplicated: Secondary | ICD-10-CM | POA: Diagnosis present

## 2014-08-16 DIAGNOSIS — IMO0002 Reserved for concepts with insufficient information to code with codable children: Secondary | ICD-10-CM | POA: Insufficient documentation

## 2014-08-16 DIAGNOSIS — T8789 Other complications of amputation stump: Secondary | ICD-10-CM | POA: Diagnosis not present

## 2014-08-16 DIAGNOSIS — R34 Anuria and oliguria: Secondary | ICD-10-CM

## 2014-08-16 DIAGNOSIS — G9341 Metabolic encephalopathy: Secondary | ICD-10-CM | POA: Diagnosis not present

## 2014-08-16 DIAGNOSIS — D62 Acute posthemorrhagic anemia: Secondary | ICD-10-CM | POA: Diagnosis not present

## 2014-08-16 DIAGNOSIS — E785 Hyperlipidemia, unspecified: Secondary | ICD-10-CM | POA: Diagnosis present

## 2014-08-16 DIAGNOSIS — Z89612 Acquired absence of left leg above knee: Secondary | ICD-10-CM | POA: Diagnosis not present

## 2014-08-16 DIAGNOSIS — R509 Fever, unspecified: Secondary | ICD-10-CM

## 2014-08-16 DIAGNOSIS — Z8249 Family history of ischemic heart disease and other diseases of the circulatory system: Secondary | ICD-10-CM | POA: Diagnosis not present

## 2014-08-16 DIAGNOSIS — F411 Generalized anxiety disorder: Secondary | ICD-10-CM | POA: Insufficient documentation

## 2014-08-16 DIAGNOSIS — Z95828 Presence of other vascular implants and grafts: Secondary | ICD-10-CM | POA: Insufficient documentation

## 2014-08-16 DIAGNOSIS — Z720 Tobacco use: Secondary | ICD-10-CM | POA: Insufficient documentation

## 2014-08-16 DIAGNOSIS — Z7982 Long term (current) use of aspirin: Secondary | ICD-10-CM

## 2014-08-16 DIAGNOSIS — R410 Disorientation, unspecified: Secondary | ICD-10-CM | POA: Insufficient documentation

## 2014-08-16 DIAGNOSIS — Z418 Encounter for other procedures for purposes other than remedying health state: Secondary | ICD-10-CM | POA: Insufficient documentation

## 2014-08-16 DIAGNOSIS — T82868A Thrombosis of vascular prosthetic devices, implants and grafts, initial encounter: Secondary | ICD-10-CM | POA: Diagnosis present

## 2014-08-16 DIAGNOSIS — O10019 Pre-existing essential hypertension complicating pregnancy, unspecified trimester: Secondary | ICD-10-CM | POA: Insufficient documentation

## 2014-08-16 DIAGNOSIS — Z419 Encounter for procedure for purposes other than remedying health state, unspecified: Secondary | ICD-10-CM

## 2014-08-16 DIAGNOSIS — Z9889 Other specified postprocedural states: Secondary | ICD-10-CM | POA: Diagnosis not present

## 2014-08-16 DIAGNOSIS — I999 Unspecified disorder of circulatory system: Secondary | ICD-10-CM | POA: Diagnosis not present

## 2014-08-16 DIAGNOSIS — K219 Gastro-esophageal reflux disease without esophagitis: Secondary | ICD-10-CM | POA: Diagnosis present

## 2014-08-16 DIAGNOSIS — Z01818 Encounter for other preprocedural examination: Secondary | ICD-10-CM

## 2014-08-16 HISTORY — DX: Low back pain: M54.5

## 2014-08-16 HISTORY — DX: Bipolar disorder, unspecified: F31.9

## 2014-08-16 HISTORY — DX: Systemic involvement of connective tissue, unspecified: M35.9

## 2014-08-16 HISTORY — DX: Other chronic pain: G89.29

## 2014-08-16 HISTORY — DX: Type 2 diabetes mellitus without complications: E11.9

## 2014-08-16 HISTORY — DX: Personal history of other medical treatment: Z92.89

## 2014-08-16 HISTORY — DX: Low back pain, unspecified: M54.50

## 2014-08-16 HISTORY — DX: Major depressive disorder, single episode, unspecified: F32.9

## 2014-08-16 HISTORY — DX: Depression, unspecified: F32.A

## 2014-08-16 LAB — BASIC METABOLIC PANEL
Anion gap: 14 (ref 5–15)
BUN: 30 mg/dL — AB (ref 6–23)
CO2: 24 mmol/L (ref 19–32)
Calcium: 8.9 mg/dL (ref 8.4–10.5)
Chloride: 97 mmol/L (ref 96–112)
Creatinine, Ser: 1.9 mg/dL — ABNORMAL HIGH (ref 0.50–1.35)
GFR calc Af Amer: 47 mL/min — ABNORMAL LOW (ref >90)
GFR calc non Af Amer: 40 mL/min — ABNORMAL LOW (ref >90)
Glucose, Bld: 128 mg/dL — ABNORMAL HIGH (ref 70–99)
Potassium: 2.6 mmol/L — CL (ref 3.5–5.1)
SODIUM: 135 mmol/L (ref 135–145)

## 2014-08-16 LAB — CBC
HCT: 34.8 % — ABNORMAL LOW (ref 39.0–52.0)
Hemoglobin: 11.4 g/dL — ABNORMAL LOW (ref 13.0–17.0)
MCH: 27.9 pg (ref 26.0–34.0)
MCHC: 32.8 g/dL (ref 30.0–36.0)
MCV: 85.1 fL (ref 78.0–100.0)
PLATELETS: 317 10*3/uL (ref 150–400)
RBC: 4.09 MIL/uL — ABNORMAL LOW (ref 4.22–5.81)
RDW: 14 % (ref 11.5–15.5)
WBC: 12.2 10*3/uL — ABNORMAL HIGH (ref 4.0–10.5)

## 2014-08-16 LAB — CBC WITH DIFFERENTIAL/PLATELET
BASOS ABS: 0.1 10*3/uL (ref 0.0–0.1)
BASOS PCT: 1 % (ref 0–1)
EOS ABS: 0.1 10*3/uL (ref 0.0–0.7)
Eosinophils Relative: 1 % (ref 0–5)
HCT: 34.9 % — ABNORMAL LOW (ref 39.0–52.0)
Hemoglobin: 11.6 g/dL — ABNORMAL LOW (ref 13.0–17.0)
Lymphocytes Relative: 14 % (ref 12–46)
Lymphs Abs: 1.8 10*3/uL (ref 0.7–4.0)
MCH: 27.8 pg (ref 26.0–34.0)
MCHC: 33.2 g/dL (ref 30.0–36.0)
MCV: 83.5 fL (ref 78.0–100.0)
Monocytes Absolute: 0.8 10*3/uL (ref 0.1–1.0)
Monocytes Relative: 6 % (ref 3–12)
NEUTROS ABS: 10.2 10*3/uL — AB (ref 1.7–7.7)
Neutrophils Relative %: 78 % — ABNORMAL HIGH (ref 43–77)
PLATELETS: 317 10*3/uL (ref 150–400)
RBC: 4.18 MIL/uL — ABNORMAL LOW (ref 4.22–5.81)
RDW: 14 % (ref 11.5–15.5)
WBC: 13 10*3/uL — AB (ref 4.0–10.5)

## 2014-08-16 LAB — COMPREHENSIVE METABOLIC PANEL
ALT: 68 U/L — ABNORMAL HIGH (ref 0–53)
ANION GAP: 11 (ref 5–15)
AST: 150 U/L — ABNORMAL HIGH (ref 0–37)
Albumin: 3.2 g/dL — ABNORMAL LOW (ref 3.5–5.2)
Alkaline Phosphatase: 78 U/L (ref 39–117)
BILIRUBIN TOTAL: 0.5 mg/dL (ref 0.3–1.2)
BUN: 30 mg/dL — ABNORMAL HIGH (ref 6–23)
CALCIUM: 8.8 mg/dL (ref 8.4–10.5)
CHLORIDE: 98 mmol/L (ref 96–112)
CO2: 27 mmol/L (ref 19–32)
Creatinine, Ser: 1.82 mg/dL — ABNORMAL HIGH (ref 0.50–1.35)
GFR, EST AFRICAN AMERICAN: 49 mL/min — AB (ref >90)
GFR, EST NON AFRICAN AMERICAN: 42 mL/min — AB (ref >90)
Glucose, Bld: 117 mg/dL — ABNORMAL HIGH (ref 70–99)
Potassium: 2.4 mmol/L — CL (ref 3.5–5.1)
Sodium: 136 mmol/L (ref 135–145)
Total Protein: 7.3 g/dL (ref 6.0–8.3)

## 2014-08-16 LAB — MRSA PCR SCREENING: MRSA by PCR: NEGATIVE

## 2014-08-16 LAB — PROTIME-INR
INR: 1.18 (ref 0.00–1.49)
PROTHROMBIN TIME: 15.1 s (ref 11.6–15.2)

## 2014-08-16 MED ORDER — PANTOPRAZOLE SODIUM 40 MG PO TBEC
40.0000 mg | DELAYED_RELEASE_TABLET | Freq: Every day | ORAL | Status: DC
  Administered 2014-08-17 – 2014-08-24 (×6): 40 mg via ORAL
  Filled 2014-08-16 (×6): qty 1

## 2014-08-16 MED ORDER — QUETIAPINE FUMARATE 100 MG PO TABS
100.0000 mg | ORAL_TABLET | Freq: Three times a day (TID) | ORAL | Status: DC
  Administered 2014-08-16 – 2014-08-21 (×9): 100 mg via ORAL
  Filled 2014-08-16 (×18): qty 1

## 2014-08-16 MED ORDER — INSULIN GLARGINE 100 UNIT/ML ~~LOC~~ SOLN
40.0000 [IU] | Freq: Every day | SUBCUTANEOUS | Status: DC
  Administered 2014-08-16 – 2014-08-23 (×8): 40 [IU] via SUBCUTANEOUS
  Filled 2014-08-16 (×12): qty 0.4

## 2014-08-16 MED ORDER — ACETAMINOPHEN 325 MG PO TABS
325.0000 mg | ORAL_TABLET | ORAL | Status: DC | PRN
  Administered 2014-08-18 – 2014-08-21 (×7): 650 mg via ORAL
  Filled 2014-08-16 (×6): qty 2

## 2014-08-16 MED ORDER — ONDANSETRON HCL 4 MG/2ML IJ SOLN
4.0000 mg | Freq: Four times a day (QID) | INTRAMUSCULAR | Status: DC | PRN
  Administered 2014-08-17: 4 mg via INTRAVENOUS
  Filled 2014-08-16: qty 2

## 2014-08-16 MED ORDER — SODIUM CHLORIDE 0.9 % IV SOLN
Freq: Once | INTRAVENOUS | Status: AC
  Administered 2014-08-16: 22:00:00 via INTRAVENOUS

## 2014-08-16 MED ORDER — OXYCODONE-ACETAMINOPHEN 5-325 MG PO TABS
ORAL_TABLET | ORAL | Status: AC
  Filled 2014-08-16: qty 1

## 2014-08-16 MED ORDER — METOPROLOL TARTRATE 1 MG/ML IV SOLN
2.0000 mg | INTRAVENOUS | Status: DC | PRN
  Filled 2014-08-16: qty 5

## 2014-08-16 MED ORDER — POTASSIUM CHLORIDE CRYS ER 20 MEQ PO TBCR
20.0000 meq | EXTENDED_RELEASE_TABLET | Freq: Once | ORAL | Status: DC
  Filled 2014-08-16: qty 2

## 2014-08-16 MED ORDER — GLIPIZIDE 10 MG PO TABS
10.0000 mg | ORAL_TABLET | Freq: Every day | ORAL | Status: DC
  Administered 2014-08-17: 10 mg via ORAL
  Filled 2014-08-16 (×4): qty 1

## 2014-08-16 MED ORDER — HEPARIN BOLUS VIA INFUSION
5000.0000 [IU] | Freq: Once | INTRAVENOUS | Status: AC
  Administered 2014-08-16: 5000 [IU] via INTRAVENOUS
  Filled 2014-08-16: qty 5000

## 2014-08-16 MED ORDER — HYDRALAZINE HCL 20 MG/ML IJ SOLN: 5.0000 mg | INTRAMUSCULAR | Status: DC | PRN

## 2014-08-16 MED ORDER — AMPHETAMINE-DEXTROAMPHETAMINE 10 MG PO TABS
15.0000 mg | ORAL_TABLET | Freq: Every day | ORAL | Status: DC
  Administered 2014-08-17 – 2014-08-24 (×6): 15 mg via ORAL
  Filled 2014-08-16 (×6): qty 2

## 2014-08-16 MED ORDER — PANTOPRAZOLE SODIUM 40 MG PO TBEC: 40.0000 mg | DELAYED_RELEASE_TABLET | Freq: Every day | ORAL | Status: DC

## 2014-08-16 MED ORDER — POTASSIUM CHLORIDE ER 10 MEQ PO TBCR
20.0000 meq | EXTENDED_RELEASE_TABLET | Freq: Every day | ORAL | Status: DC
  Administered 2014-08-16 – 2014-08-17 (×2): 20 meq via ORAL
  Filled 2014-08-16 (×4): qty 2

## 2014-08-16 MED ORDER — IOHEXOL 350 MG/ML SOLN
100.0000 mL | Freq: Once | INTRAVENOUS | Status: AC | PRN
  Administered 2014-08-16: 100 mL via INTRAVENOUS

## 2014-08-16 MED ORDER — MORPHINE SULFATE (PF) 1 MG/ML IV SOLN
INTRAVENOUS | Status: DC
  Administered 2014-08-16: 1 mg via INTRAVENOUS
  Administered 2014-08-17: 7.99 mg via INTRAVENOUS
  Administered 2014-08-17: 10.5 mg via INTRAVENOUS
  Administered 2014-08-17: 8 mg via INTRAVENOUS
  Administered 2014-08-17: 1 mg via INTRAVENOUS
  Administered 2014-08-17: 12:00:00 via INTRAVENOUS
  Administered 2014-08-17: 21 mg via INTRAVENOUS
  Administered 2014-08-18: via INTRAVENOUS
  Administered 2014-08-18: 7.01 mg via INTRAVENOUS
  Administered 2014-08-18: 7.5 mg via INTRAVENOUS
  Administered 2014-08-19: 13.5 mg via INTRAVENOUS
  Administered 2014-08-19: 8.12 mg via INTRAVENOUS
  Administered 2014-08-19: 02:00:00 via INTRAVENOUS
  Administered 2014-08-19: 12 mg via INTRAVENOUS
  Filled 2014-08-16 (×5): qty 25

## 2014-08-16 MED ORDER — SODIUM CHLORIDE 0.9 % IJ SOLN
3.0000 mL | Freq: Two times a day (BID) | INTRAMUSCULAR | Status: DC
  Administered 2014-08-17 – 2014-08-23 (×7): 3 mL via INTRAVENOUS

## 2014-08-16 MED ORDER — INFLUENZA VAC SPLIT QUAD 0.5 ML IM SUSY
0.5000 mL | PREFILLED_SYRINGE | INTRAMUSCULAR | Status: AC
  Administered 2014-08-17: 0.5 mL via INTRAMUSCULAR
  Filled 2014-08-16: qty 0.5

## 2014-08-16 MED ORDER — LISINOPRIL 20 MG PO TABS
20.0000 mg | ORAL_TABLET | Freq: Two times a day (BID) | ORAL | Status: DC
  Administered 2014-08-16 – 2014-08-19 (×4): 20 mg via ORAL
  Filled 2014-08-16 (×8): qty 1

## 2014-08-16 MED ORDER — SODIUM CHLORIDE 0.9 % IJ SOLN: 9.0000 mL | INTRAMUSCULAR | Status: DC | PRN

## 2014-08-16 MED ORDER — DIPHENHYDRAMINE HCL 12.5 MG/5ML PO ELIX
12.5000 mg | ORAL_SOLUTION | Freq: Four times a day (QID) | ORAL | Status: DC | PRN
  Filled 2014-08-16: qty 5

## 2014-08-16 MED ORDER — LABETALOL HCL 5 MG/ML IV SOLN
10.0000 mg | INTRAVENOUS | Status: DC | PRN
  Filled 2014-08-16: qty 4

## 2014-08-16 MED ORDER — INSULIN ASPART 100 UNIT/ML ~~LOC~~ SOLN
0.0000 [IU] | Freq: Three times a day (TID) | SUBCUTANEOUS | Status: DC
  Administered 2014-08-17 – 2014-08-19 (×4): 3 [IU] via SUBCUTANEOUS
  Administered 2014-08-21 – 2014-08-22 (×3): 2 [IU] via SUBCUTANEOUS
  Administered 2014-08-23: 5 [IU] via SUBCUTANEOUS
  Administered 2014-08-23 – 2014-08-24 (×2): 3 [IU] via SUBCUTANEOUS

## 2014-08-16 MED ORDER — ASPIRIN EC 325 MG PO TBEC
325.0000 mg | DELAYED_RELEASE_TABLET | Freq: Every day | ORAL | Status: DC
  Administered 2014-08-17 – 2014-08-19 (×2): 325 mg via ORAL
  Filled 2014-08-16 (×3): qty 1

## 2014-08-16 MED ORDER — NALOXONE HCL 0.4 MG/ML IJ SOLN: 0.4000 mg | INTRAMUSCULAR | Status: DC | PRN

## 2014-08-16 MED ORDER — SODIUM CHLORIDE 0.9 % IV SOLN
250.0000 mL | INTRAVENOUS | Status: DC | PRN
  Administered 2014-08-19: 1000 mL via INTRAVENOUS
  Administered 2014-08-20 (×2): via INTRAVENOUS

## 2014-08-16 MED ORDER — HYDROCHLOROTHIAZIDE 50 MG PO TABS
50.0000 mg | ORAL_TABLET | Freq: Every day | ORAL | Status: DC
  Administered 2014-08-17: 50 mg via ORAL
  Filled 2014-08-16 (×3): qty 1

## 2014-08-16 MED ORDER — ALUM & MAG HYDROXIDE-SIMETH 200-200-20 MG/5ML PO SUSP
15.0000 mL | ORAL | Status: DC | PRN
  Filled 2014-08-16: qty 30

## 2014-08-16 MED ORDER — GABAPENTIN 300 MG PO CAPS
300.0000 mg | ORAL_CAPSULE | Freq: Three times a day (TID) | ORAL | Status: DC
  Administered 2014-08-16 – 2014-08-17 (×4): 300 mg via ORAL
  Filled 2014-08-16 (×11): qty 1

## 2014-08-16 MED ORDER — GUAIFENESIN-DM 100-10 MG/5ML PO SYRP: 15.0000 mL | ORAL_SOLUTION | ORAL | Status: DC | PRN

## 2014-08-16 MED ORDER — ACETAMINOPHEN 650 MG RE SUPP: 325.0000 mg | RECTAL | Status: DC | PRN

## 2014-08-16 MED ORDER — SODIUM CHLORIDE 0.9 % IV BOLUS (SEPSIS)
500.0000 mL | Freq: Once | INTRAVENOUS | Status: AC
  Administered 2014-08-16: 500 mL via INTRAVENOUS

## 2014-08-16 MED ORDER — DIPHENHYDRAMINE HCL 50 MG/ML IJ SOLN
12.5000 mg | Freq: Four times a day (QID) | INTRAMUSCULAR | Status: DC | PRN
  Administered 2014-08-18: 12.5 mg via INTRAVENOUS
  Filled 2014-08-16: qty 1

## 2014-08-16 MED ORDER — ALPRAZOLAM 0.5 MG PO TABS
1.0000 mg | ORAL_TABLET | Freq: Three times a day (TID) | ORAL | Status: DC
  Administered 2014-08-16 – 2014-08-19 (×8): 1 mg via ORAL
  Filled 2014-08-16 (×7): qty 2

## 2014-08-16 MED ORDER — OXYCODONE-ACETAMINOPHEN 5-325 MG PO TABS
1.0000 | ORAL_TABLET | Freq: Once | ORAL | Status: AC
  Administered 2014-08-16: 1 via ORAL

## 2014-08-16 MED ORDER — BUPROPION HCL 100 MG PO TABS
100.0000 mg | ORAL_TABLET | Freq: Three times a day (TID) | ORAL | Status: DC
  Administered 2014-08-16 – 2014-08-24 (×19): 100 mg via ORAL
  Filled 2014-08-16 (×28): qty 1

## 2014-08-16 MED ORDER — ATORVASTATIN CALCIUM 40 MG PO TABS
40.0000 mg | ORAL_TABLET | Freq: Every morning | ORAL | Status: DC
  Administered 2014-08-17 – 2014-08-24 (×6): 40 mg via ORAL
  Filled 2014-08-16 (×8): qty 1

## 2014-08-16 MED ORDER — SODIUM CHLORIDE 0.9 % IJ SOLN
3.0000 mL | INTRAMUSCULAR | Status: DC | PRN
  Administered 2014-08-19: 3 mL via INTRAVENOUS
  Filled 2014-08-16: qty 3

## 2014-08-16 MED ORDER — POTASSIUM CHLORIDE 10 MEQ/100ML IV SOLN
10.0000 meq | INTRAVENOUS | Status: AC
  Administered 2014-08-16 – 2014-08-17 (×3): 10 meq via INTRAVENOUS
  Filled 2014-08-16 (×3): qty 100

## 2014-08-16 MED ORDER — HEPARIN (PORCINE) IN NACL 100-0.45 UNIT/ML-% IJ SOLN
1600.0000 [IU]/h | INTRAMUSCULAR | Status: DC
  Administered 2014-08-16: 1600 [IU]/h via INTRAVENOUS
  Filled 2014-08-16 (×2): qty 250

## 2014-08-16 MED ORDER — ATENOLOL 50 MG PO TABS
50.0000 mg | ORAL_TABLET | Freq: Two times a day (BID) | ORAL | Status: DC
  Administered 2014-08-16 – 2014-08-19 (×4): 50 mg via ORAL
  Filled 2014-08-16 (×8): qty 1

## 2014-08-16 MED ORDER — PHENOL 1.4 % MT LIQD
1.0000 | OROMUCOSAL | Status: DC | PRN
  Filled 2014-08-16: qty 177

## 2014-08-16 MED ORDER — HYDROMORPHONE HCL 1 MG/ML IJ SOLN
1.0000 mg | Freq: Once | INTRAMUSCULAR | Status: AC
  Administered 2014-08-16: 1 mg via INTRAVENOUS
  Filled 2014-08-16: qty 1

## 2014-08-16 NOTE — Progress Notes (Signed)
Received report from Avery DennisonBrooke RN. Afleming, RN

## 2014-08-16 NOTE — ED Notes (Signed)
Dr. Arbie CookeyEarly notified of critical lab value, potassium of 2.6. Advised pt needs 3 runs of 10meq potassium and 100 of normal saline with repeat cbc and bmet.

## 2014-08-16 NOTE — ED Notes (Signed)
Patient was given a cup of decaf. Coffee.

## 2014-08-16 NOTE — Progress Notes (Signed)
Patient arrived to the unit from the ED. Afleming, RN
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
## 2014-08-16 NOTE — Progress Notes (Signed)
ANTICOAGULATION CONSULT NOTE - Initial Consult  Pharmacy Consult for Heparin Indication: Occluded aortobifem  No Known Allergies  Patient Measurements: TBW 97 kg in 04/2014 IBW 82.2 kg  Vital Signs: Temp: 97.5 F (36.4 C) (02/17 1622) Temp Source: Oral (02/17 1622) BP: 141/88 mmHg (02/17 1622) Pulse Rate: 84 (02/17 1622)  Labs:  Recent Labs  08/16/14 1715  HGB 11.6*  HCT 34.9*  PLT 317    CrCl cannot be calculated (Unknown ideal weight.).   Medical History: Past Medical History  Diagnosis Date  . GERD (gastroesophageal reflux disease)   . Nicotine addiction   . ETOH abuse   . Anxiety   . Hypertension     no pcp     saw peter nisham once  . Stroke 08/11/11    slurred speech  . Peripheral vascular disease   . Diabetes mellitus without complication   . Hyperlipidemia     Medications:   (Not in a hospital admission)  Assessment: 49 yo M presents on 2/17 with pain from both legs. The bottom of the legs sometimes are cool in temperature. Has a history of aortofemoral bypass grafting in 2013. Pharmacy to dose heparin for occluded aortofemoral artery. Hgb is 11.6, plts wnl. No s/s of bleed.  Goal of Therapy:  Heparin level 0.3-0.7 units/ml Monitor platelets by anticoagulation protocol: Yes   Plan:  Give heparin 5,000 unit BOLUS Start heparin gtt at 1,600 units/hr Check 6 hr HL at 0030 Monitor daily HL, CBC, s/s of bleed  Dagmawi Venable J 08/16/2014,5:51 PM

## 2014-08-16 NOTE — ED Provider Notes (Signed)
Date: 08/16/2014  Rate: 81  Rhythm: normal sinus rhythm  QRS Axis: normal  Intervals: normal  ST/T Wave abnormalities: normal  Conduction Disutrbances: none  Narrative Interpretation: unremarkable     Donnetta HutchingBrian Gunther Zawadzki, MD 08/16/14 1948
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
## 2014-08-16 NOTE — Progress Notes (Signed)
08/16/2014 11:01 PM The lab informed of a critical potassium level of 2.4. The patient, scheduled for 3 potassium runs, has only completed one bag of potassium chloride currently. Dr. Arbie CookeyEarly was informed and said to have a BMP done in the morning and to continue his runs of potassium chloride.  Will continue to monitor the patient. Harriet Massonavidson, Amiel Sharrow E

## 2014-08-16 NOTE — ED Notes (Signed)
Pt here for black area to left knee area where his prosthetic leg is. sts area is cold and right leg is cold. sts lost feeling in left leg below knee.

## 2014-08-17 LAB — BASIC METABOLIC PANEL
Anion gap: 10 (ref 5–15)
Anion gap: 5 (ref 5–15)
BUN: 22 mg/dL (ref 6–23)
BUN: 26 mg/dL — AB (ref 6–23)
CHLORIDE: 100 mmol/L (ref 96–112)
CO2: 26 mmol/L (ref 19–32)
CO2: 30 mmol/L (ref 19–32)
Calcium: 8.6 mg/dL (ref 8.4–10.5)
Calcium: 8.8 mg/dL (ref 8.4–10.5)
Chloride: 98 mmol/L (ref 96–112)
Creatinine, Ser: 1.2 mg/dL (ref 0.50–1.35)
Creatinine, Ser: 1.42 mg/dL — ABNORMAL HIGH (ref 0.50–1.35)
GFR calc non Af Amer: 70 mL/min — ABNORMAL LOW (ref >90)
GFR, EST AFRICAN AMERICAN: 81 mL/min — AB (ref >90)
GFR, EST AFRICAN AMERICAN: 66 mL/min — AB (ref >90)
GFR, EST NON AFRICAN AMERICAN: 57 mL/min — AB (ref >90)
Glucose, Bld: 134 mg/dL — ABNORMAL HIGH (ref 70–99)
Glucose, Bld: 163 mg/dL — ABNORMAL HIGH (ref 70–99)
POTASSIUM: 2.8 mmol/L — AB (ref 3.5–5.1)
Potassium: 2.6 mmol/L — CL (ref 3.5–5.1)
Sodium: 133 mmol/L — ABNORMAL LOW (ref 135–145)
Sodium: 136 mmol/L (ref 135–145)

## 2014-08-17 LAB — GLUCOSE, CAPILLARY
GLUCOSE-CAPILLARY: 119 mg/dL — AB (ref 70–99)
Glucose-Capillary: 149 mg/dL — ABNORMAL HIGH (ref 70–99)
Glucose-Capillary: 151 mg/dL — ABNORMAL HIGH (ref 70–99)
Glucose-Capillary: 117 mg/dL — ABNORMAL HIGH (ref 70–99)

## 2014-08-17 LAB — URINALYSIS, ROUTINE W REFLEX MICROSCOPIC
BILIRUBIN URINE: NEGATIVE
Glucose, UA: NEGATIVE mg/dL
Ketones, ur: NEGATIVE mg/dL
Leukocytes, UA: NEGATIVE
NITRITE: NEGATIVE
Protein, ur: NEGATIVE mg/dL
Specific Gravity, Urine: 1.034 — ABNORMAL HIGH (ref 1.005–1.030)
UROBILINOGEN UA: 0.2 mg/dL (ref 0.0–1.0)
pH: 5.5 (ref 5.0–8.0)

## 2014-08-17 LAB — URINE MICROSCOPIC-ADD ON: RBC / HPF: NONE SEEN RBC/hpf (ref <3)

## 2014-08-17 LAB — CBC
HCT: 34 % — ABNORMAL LOW (ref 39.0–52.0)
Hemoglobin: 11.2 g/dL — ABNORMAL LOW (ref 13.0–17.0)
MCH: 28.1 pg (ref 26.0–34.0)
MCHC: 32.9 g/dL (ref 30.0–36.0)
MCV: 85.2 fL (ref 78.0–100.0)
PLATELETS: 317 10*3/uL (ref 150–400)
RBC: 3.99 MIL/uL — ABNORMAL LOW (ref 4.22–5.81)
RDW: 13.9 % (ref 11.5–15.5)
WBC: 11.6 10*3/uL — AB (ref 4.0–10.5)

## 2014-08-17 LAB — HEMOGLOBIN A1C
Hgb A1c MFr Bld: 10.5 % — ABNORMAL HIGH (ref 4.8–5.6)
Mean Plasma Glucose: 255 mg/dL

## 2014-08-17 LAB — HEPARIN LEVEL (UNFRACTIONATED)
HEPARIN UNFRACTIONATED: 0.31 [IU]/mL (ref 0.30–0.70)
Heparin Unfractionated: 0.45 IU/mL (ref 0.30–0.70)
Heparin Unfractionated: <0.1 IU/mL — ABNORMAL LOW (ref 0.30–0.70)

## 2014-08-17 MED ORDER — DEXTROSE 5 % IV SOLN: 1.5000 g | INTRAVENOUS | Status: DC

## 2014-08-17 MED ORDER — OXYCODONE-ACETAMINOPHEN 5-325 MG PO TABS
1.0000 | ORAL_TABLET | ORAL | Status: DC | PRN
  Administered 2014-08-17 – 2014-08-18 (×3): 1 via ORAL
  Filled 2014-08-17 (×2): qty 1

## 2014-08-17 MED ORDER — POTASSIUM CHLORIDE CRYS ER 20 MEQ PO TBCR
40.0000 meq | EXTENDED_RELEASE_TABLET | Freq: Once | ORAL | Status: AC
  Administered 2014-08-17: 40 meq via ORAL
  Filled 2014-08-17: qty 2

## 2014-08-17 MED ORDER — DEXTROSE 5 % IV SOLN
1.5000 g | INTRAVENOUS | Status: AC
Start: 2014-08-18 — End: 2014-08-18
  Administered 2014-08-18 (×2): 1.5 g via INTRAVENOUS
  Filled 2014-08-17: qty 1.5

## 2014-08-17 MED ORDER — HEPARIN BOLUS VIA INFUSION
3000.0000 [IU] | Freq: Once | INTRAVENOUS | Status: AC
  Administered 2014-08-17: 3000 [IU] via INTRAVENOUS
  Filled 2014-08-17: qty 3000

## 2014-08-17 MED ORDER — HEPARIN (PORCINE) IN NACL 100-0.45 UNIT/ML-% IJ SOLN
1800.0000 [IU]/h | INTRAMUSCULAR | Status: DC
  Administered 2014-08-17 – 2014-08-18 (×2): 1800 [IU]/h via INTRAVENOUS
  Filled 2014-08-17 (×3): qty 250

## 2014-08-17 NOTE — Progress Notes (Signed)
ANTICOAGULATION CONSULT NOTE - Follow Up Consult  Pharmacy Consult for heparin Indication: recurrent aortobifemoral occlusion  Labs:  Recent Labs  08/16/14 1715 08/16/14 1742 08/17/14 0028  HGB 11.6* 11.4* 11.2*  HCT 34.9* 34.8* 34.0*  PLT 317 317 317  LABPROT  --  15.1  --   INR  --  1.18  --   HEPARINUNFRC  --   --  0.45  CREATININE 1.90* 1.82* 1.42*     Assessment/Plan:  49yo male therapeutic on heparin with initial dosing for aortobifemoral occlusion. Will continue gtt at current rate and confirm stable with additional level.   Vernard GamblesVeronda Marven Veley, PharmD, BCPS  08/17/2014,1:05 AM

## 2014-08-17 NOTE — Progress Notes (Signed)
ANTICOAGULATION CONSULT NOTE - Follow Up Consult  Pharmacy Consult for heparin  Indication: recurrent aortobifemoral occlusion  No Known Allergies  Patient Measurements: Height: 6\' 2"  (188 cm) Weight: 214 lb 1.1 oz (97.1 kg) IBW/kg (Calculated) : 82.2 Heparin Dosing Weight: 97 kg  Vital Signs: Temp: 99.1 F (37.3 C) (02/18 0444) Temp Source: Oral (02/18 0444) BP: 136/79 mmHg (02/18 0444) Pulse Rate: 88 (02/18 0444)  Labs:  Recent Labs  08/16/14 1715 08/16/14 1742 08/17/14 0028 08/17/14 0600 08/17/14 0945  HGB 11.6* 11.4* 11.2*  --   --   HCT 34.9* 34.8* 34.0*  --   --   PLT 317 317 317  --   --   LABPROT  --  15.1  --   --   --   INR  --  1.18  --   --   --   HEPARINUNFRC  --   --  0.45  --  <0.10*  CREATININE 1.90* 1.82* 1.42* 1.20  --     Estimated Creatinine Clearance: 87.5 mL/min (by C-G formula based on Cr of 1.2).   Assessment: Patient is a 49 y.o on heparin for PVD with plan for right axillofemoral femorofemoral bypass 2/19 .  Heparin level is now undetectable.  RN reported that patient's IV line got occluded on and off this morning.  No bleeding documented.   Goal of Therapy:  Heparin level 0.3-0.7 units/ml Monitor platelets by anticoagulation protocol: Yes   Plan:  - heparin 3000 units IV x1 bolus, then increase drip to 1800 unit/hr - recheck 6 hour heparin level   Sonna Lipsky P 08/17/2014,11:57 AM

## 2014-08-17 NOTE — Progress Notes (Signed)
[facility]   Subjective  - pt complains of pain left bka but overall controlled   Objective 136/79 88 99.1 F (37.3 C) (Oral) 20 100%  Intake/Output Summary (Last 24 hours) at 08/17/14 0732 Last data filed at 08/17/14 0500  Gross per 24 hour  Intake    200 ml  Output      0 ml  Net    200 ml   Left BKA ulcer over patella, dusky to just above knee Right foot cool but no sensory or motor deficit  CTA reviewed aortobifem femfem all occluded reconstitiution of right leg, chronic left SFA occlusion  Assessment/Planning: Not enough flow to heal left aka with threatened limb on right although currently asymptomatic.  Agree with Dr Arbie CookeyEarly that Ax bifem is probably best option at this point followed by interval AKA to determine appropriate level.  Discussed with pt that axfem may be of limited durability especially with his young age but that this would be least morbidity.  His symptoms seem to have started about 2 weeks ago according to pt history so most likely this is subacute so may be worth while to try thrombectomy prior to proceeding with ax bifem.  Will discuss with Dr Arbie CookeyEarly.  Hypokalemia will treat.   Jaleisa Brose E 08/17/2014 7:32 AM --  Laboratory Lab Results:  Recent Labs  08/16/14 1742 08/17/14 0028  WBC 12.2* 11.6*  HGB 11.4* 11.2*  HCT 34.8* 34.0*  PLT 317 317   BMET  Recent Labs  08/16/14 1742 08/17/14 0028  NA 136 133*  K 2.4* 2.6*  CL 98 98  CO2 27 30  GLUCOSE 117* 163*  BUN 30* 26*  CREATININE 1.82* 1.42*  CALCIUM 8.8 8.6    COAG Lab Results  Component Value Date   INR 1.18 08/16/2014   INR 1.14 09/05/2013   INR 0.99 08/30/2013   No results found for: PTT

## 2014-08-17 NOTE — Progress Notes (Signed)
Inpatient Diabetes Program Recommendations  AACE/ADA: New Consensus Statement on Inpatient Glycemic Control (2013)  Target Ranges:  Prepandial:   less than 140 mg/dL      Peak postprandial:   less than 180 mg/dL (1-2 hours)      Critically ill patients:  140 - 180 mg/dL  Results for [name] [name], [name] [name] (MRN [id number]) as of 08/17/2014 14:29  Ref. Range 08/16/2014 21:53 08/17/2014 06:34 08/17/2014 12:01  Glucose-Capillary Latest Range: 70-99 mg/dL 829149 (H) 562151 (H) 130119 (H)    Inpatient Diabetes Program Recommendations HgbA1C: =10.5  Note: This coordinator attempted to speak with patient concerning A1C but pt kept falling asleep.  Will attempt visit at another time. Thank you  Piedad ClimesGina Jenilyn Magana BSN, RN,CDE Inpatient Diabetes Coordinator [phone] (team pager)

## 2014-08-17 NOTE — Progress Notes (Signed)
Patient ID: [name] LauthRobert Culliton, male   DOB: Nov 17, 1965, 49 y.o.   MRN: [id number] Discussed with Dr. Darrick Pennafields and with the patient and his family present. 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 We did discuss about the potential nonhealing. His CT angiogram shows no flow at the common femoral artery level or iliac level on the left. We'll attempt a thrombectomy of this area and also thrombectomy of his right limb of his aortofemoral bypass. May require axillofemoral bypasses well since he has had failure of his femorofemoral based on his old aortofemoral graft which is failed as well. Surgery scheduled for tomorrow.

## 2014-08-17 NOTE — Progress Notes (Signed)
ANTICOAGULATION CONSULT NOTE - Follow Up Consult  Pharmacy Consult for heparin  Indication: recurrent aortobifemoral occlusion  No Known Allergies  Patient Measurements: Height: 6\' 2"  (188 cm) Weight: 214 lb 1.1 oz (97.1 kg) IBW/kg (Calculated) : 82.2 Heparin Dosing Weight: 97 kg  Vital Signs: Temp: 100.3 F (37.9 C) (02/18 1314) Temp Source: Oral (02/18 1314) BP: 148/92 mmHg (02/18 1314) Pulse Rate: 98 (02/18 1314)  Labs:  Recent Labs  08/16/14 1715 08/16/14 1742 08/17/14 0028 08/17/14 0600 08/17/14 0945 08/17/14 1855  HGB 11.6* 11.4* 11.2*  --   --   --   HCT 34.9* 34.8* 34.0*  --   --   --   PLT 317 317 317  --   --   --   LABPROT  --  15.1  --   --   --   --   INR  --  1.18  --   --   --   --   HEPARINUNFRC  --   --  0.45  --  <0.10* 0.31  CREATININE 1.90* 1.82* 1.42* 1.20  --   --     Estimated Creatinine Clearance: 87.5 mL/min (by C-G formula based on Cr of 1.2).   Assessment: 49 yo male on heparin for PVD with plan for right axillofemoral femorofemoral bypass on 2/19.  Heparin level is now at goal (HL= 0.31) after heparin bolus and infusion increase. Prior undetectable level may have been due to reported IV line occlusions.  Goal of Therapy:  Heparin level 0.3-0.7 units/ml Monitor platelets by anticoagulation protocol: Yes   Plan:  -No heparin changes -Will confirm heparin level with CBC in am  Harland GermanAndrew Kristine Chahal, Pharm D 08/17/2014 7:47 PM

## 2014-08-17 NOTE — Progress Notes (Signed)
On call vascular MD, dr. Imogene Burnhen, paged due to increased lethargy throughout the day and falling asleep during conversations. Patient is easy awoken and alert and oriented times 4, no abnormal neuro changes. Vitals wdl and no abnormal respiratory patterns.  Patients s/o at beside states that he has acted liked this previously on morphine and at home sometimes. No more percocet or xanex given. No new orders from Dr. Imogene Burnhen, but Will continue to monitor.  Jacqulynn CadetPotter, Kasim Mccorkle A

## 2014-08-18 ENCOUNTER — Encounter (HOSPITAL_COMMUNITY)
Admission: EM | Disposition: A | Discharge status: Rehabilitation Facility | Payer: Self-pay | Source: Home / Self Care | Attending: Vascular Surgery

## 2014-08-18 ENCOUNTER — Inpatient Hospital Stay (HOSPITAL_COMMUNITY): Payer: Medicaid Other | Admitting: Certified Registered"

## 2014-08-18 HISTORY — PX: AXILLARY-FEMORAL BYPASS GRAFT: SHX894

## 2014-08-18 LAB — HEPARIN LEVEL (UNFRACTIONATED): Heparin Unfractionated: 0.16 IU/mL — ABNORMAL LOW (ref 0.30–0.70)

## 2014-08-18 LAB — BASIC METABOLIC PANEL
Anion gap: 8 (ref 5–15)
BUN: 10 mg/dL (ref 6–23)
CO2: 31 mmol/L (ref 19–32)
Calcium: 8.7 mg/dL (ref 8.4–10.5)
Chloride: 97 mmol/L (ref 96–112)
Creatinine, Ser: 1.09 mg/dL (ref 0.50–1.35)
GFR calc non Af Amer: 79 mL/min — ABNORMAL LOW (ref >90)
Glucose, Bld: 131 mg/dL — ABNORMAL HIGH (ref 70–99)
Potassium: 2.5 mmol/L — CL (ref 3.5–5.1)
SODIUM: 136 mmol/L (ref 135–145)

## 2014-08-18 LAB — CREATININE, SERUM
Creatinine, Ser: 1.21 mg/dL (ref 0.50–1.35)
GFR calc Af Amer: 80 mL/min — ABNORMAL LOW (ref >90)
GFR calc non Af Amer: 69 mL/min — ABNORMAL LOW (ref >90)

## 2014-08-18 LAB — CBC
HEMATOCRIT: 31.9 % — AB (ref 39.0–52.0)
HEMATOCRIT: 30.5 % — AB (ref 39.0–52.0)
HEMOGLOBIN: 9.9 g/dL — AB (ref 13.0–17.0)
Hemoglobin: 10.3 g/dL — ABNORMAL LOW (ref 13.0–17.0)
MCH: 27.2 pg (ref 26.0–34.0)
MCH: 27.8 pg (ref 26.0–34.0)
MCHC: 32.3 g/dL (ref 30.0–36.0)
MCHC: 32.5 g/dL (ref 30.0–36.0)
MCV: 84.4 fL (ref 78.0–100.0)
MCV: 85.7 fL (ref 78.0–100.0)
Platelets: 304 10*3/uL (ref 150–400)
Platelets: 275 10*3/uL (ref 150–400)
RBC: 3.78 MIL/uL — ABNORMAL LOW (ref 4.22–5.81)
RBC: 3.56 MIL/uL — AB (ref 4.22–5.81)
RDW: 13.9 % (ref 11.5–15.5)
RDW: 14 % (ref 11.5–15.5)
WBC: 12.1 10*3/uL — ABNORMAL HIGH (ref 4.0–10.5)
WBC: 12 10*3/uL — ABNORMAL HIGH (ref 4.0–10.5)

## 2014-08-18 LAB — GLUCOSE, CAPILLARY
GLUCOSE-CAPILLARY: 112 mg/dL — AB (ref 70–99)
GLUCOSE-CAPILLARY: 187 mg/dL — AB (ref 70–99)
Glucose-Capillary: 210 mg/dL — ABNORMAL HIGH (ref 70–99)
Glucose-Capillary: 125 mg/dL — ABNORMAL HIGH (ref 70–99)
Glucose-Capillary: 135 mg/dL — ABNORMAL HIGH (ref 70–99)

## 2014-08-18 LAB — POCT I-STAT 4, (NA,K, GLUC, HGB,HCT)
Glucose, Bld: 133 mg/dL — ABNORMAL HIGH (ref 70–99)
HEMATOCRIT: 28 % — AB (ref 39.0–52.0)
HEMOGLOBIN: 9.5 g/dL — AB (ref 13.0–17.0)
Potassium: 2.7 mmol/L — CL (ref 3.5–5.1)
Sodium: 136 mmol/L (ref 135–145)

## 2014-08-18 LAB — PROTIME-INR
INR: 1.2 (ref 0.00–1.49)
PROTHROMBIN TIME: 15.4 s — AB (ref 11.6–15.2)

## 2014-08-18 LAB — PREPARE RBC (CROSSMATCH)

## 2014-08-18 SURGERY — CREATION, BYPASS, ARTERIAL, AXILLARY TO BILATERAL FEMORAL, USING GRAFT
Anesthesia: General | Site: Groin | Laterality: Bilateral

## 2014-08-18 MED ORDER — HYDROMORPHONE HCL 1 MG/ML IJ SOLN
INTRAMUSCULAR | Status: AC
  Administered 2014-08-18: 0.5 mg via INTRAVENOUS
  Filled 2014-08-18: qty 1

## 2014-08-18 MED ORDER — EPHEDRINE SULFATE 50 MG/ML IJ SOLN
INTRAMUSCULAR | Status: AC
  Filled 2014-08-18: qty 1

## 2014-08-18 MED ORDER — DOCUSATE SODIUM 100 MG PO CAPS
100.0000 mg | ORAL_CAPSULE | Freq: Every day | ORAL | Status: DC
  Administered 2014-08-19 – 2014-08-24 (×5): 100 mg via ORAL
  Filled 2014-08-18 (×6): qty 1

## 2014-08-18 MED ORDER — LABETALOL HCL 5 MG/ML IV SOLN
INTRAVENOUS | Status: AC
  Administered 2014-08-18: 5 mg via INTRAVENOUS
  Filled 2014-08-18: qty 4

## 2014-08-18 MED ORDER — OXYCODONE HCL 5 MG PO TABS
5.0000 mg | ORAL_TABLET | ORAL | Status: DC | PRN
  Administered 2014-08-18 – 2014-08-19 (×2): 5 mg via ORAL
  Filled 2014-08-18 (×2): qty 1

## 2014-08-18 MED ORDER — FENTANYL CITRATE 0.05 MG/ML IJ SOLN
INTRAMUSCULAR | Status: AC
  Filled 2014-08-18: qty 5

## 2014-08-18 MED ORDER — CEFUROXIME SODIUM 1.5 G IJ SOLR
1.5000 g | Freq: Two times a day (BID) | INTRAMUSCULAR | Status: DC
  Administered 2014-08-19: 1.5 g via INTRAVENOUS
  Filled 2014-08-18 (×2): qty 1.5

## 2014-08-18 MED ORDER — SUCCINYLCHOLINE CHLORIDE 20 MG/ML IJ SOLN
INTRAMUSCULAR | Status: DC | PRN
  Administered 2014-08-18: 100 mg via INTRAVENOUS

## 2014-08-18 MED ORDER — OXYCODONE HCL 5 MG/5ML PO SOLN: 5.0000 mg | Freq: Once | ORAL | Status: AC | PRN

## 2014-08-18 MED ORDER — FENTANYL CITRATE 0.05 MG/ML IJ SOLN
INTRAMUSCULAR | Status: DC | PRN
  Administered 2014-08-18 (×8): 50 ug via INTRAVENOUS

## 2014-08-18 MED ORDER — PROPOFOL 10 MG/ML IV BOLUS
INTRAVENOUS | Status: AC
  Filled 2014-08-18: qty 20

## 2014-08-18 MED ORDER — SODIUM CHLORIDE 0.9 % IR SOLN
Status: DC | PRN
  Administered 2014-08-18: 500 mL

## 2014-08-18 MED ORDER — MIDAZOLAM HCL 2 MG/2ML IJ SOLN
INTRAMUSCULAR | Status: AC
  Filled 2014-08-18: qty 2

## 2014-08-18 MED ORDER — BISACODYL 10 MG RE SUPP: 10.0000 mg | Freq: Every day | RECTAL | Status: DC | PRN

## 2014-08-18 MED ORDER — GLYCOPYRROLATE 0.2 MG/ML IJ SOLN
INTRAMUSCULAR | Status: AC
  Filled 2014-08-18: qty 2

## 2014-08-18 MED ORDER — PROPOFOL 10 MG/ML IV BOLUS
INTRAVENOUS | Status: DC | PRN
  Administered 2014-08-18: 50 mg via INTRAVENOUS
  Administered 2014-08-18: 30 mg via INTRAVENOUS
  Administered 2014-08-18: 120 mg via INTRAVENOUS

## 2014-08-18 MED ORDER — LIDOCAINE HCL (CARDIAC) 20 MG/ML IV SOLN
INTRAVENOUS | Status: AC
  Filled 2014-08-18: qty 5

## 2014-08-18 MED ORDER — HEPARIN SODIUM (PORCINE) 1000 UNIT/ML IJ SOLN
INTRAMUSCULAR | Status: DC | PRN
  Administered 2014-08-18: 3000 [IU] via INTRAVENOUS
  Administered 2014-08-18: 8000 [IU] via INTRAVENOUS

## 2014-08-18 MED ORDER — LACTATED RINGERS IV SOLN
INTRAVENOUS | Status: DC
  Administered 2014-08-18: 10:00:00 via INTRAVENOUS

## 2014-08-18 MED ORDER — 0.9 % SODIUM CHLORIDE (POUR BTL) OPTIME
TOPICAL | Status: DC | PRN
  Administered 2014-08-18: 2000 mL

## 2014-08-18 MED ORDER — ROCURONIUM BROMIDE 50 MG/5ML IV SOLN
INTRAVENOUS | Status: AC
  Filled 2014-08-18: qty 1

## 2014-08-18 MED ORDER — ALPRAZOLAM 0.25 MG PO TABS
ORAL_TABLET | ORAL | Status: AC
  Filled 2014-08-18: qty 4

## 2014-08-18 MED ORDER — ACETAMINOPHEN 325 MG PO TABS
325.0000 mg | ORAL_TABLET | ORAL | Status: DC | PRN
Start: 2014-08-18 — End: 2014-08-18

## 2014-08-18 MED ORDER — LIDOCAINE HCL (CARDIAC) 20 MG/ML IV SOLN
INTRAVENOUS | Status: DC | PRN
  Administered 2014-08-18: 100 mg via INTRAVENOUS

## 2014-08-18 MED ORDER — HEPARIN SODIUM (PORCINE) 1000 UNIT/ML IJ SOLN
INTRAMUSCULAR | Status: AC
  Filled 2014-08-18: qty 1

## 2014-08-18 MED ORDER — PROTAMINE SULFATE 10 MG/ML IV SOLN
INTRAVENOUS | Status: DC | PRN
  Administered 2014-08-18: 20 mg via INTRAVENOUS
  Administered 2014-08-18: 10 mg via INTRAVENOUS
  Administered 2014-08-18: 20 mg via INTRAVENOUS

## 2014-08-18 MED ORDER — HYDROMORPHONE HCL 1 MG/ML IJ SOLN
0.2500 mg | INTRAMUSCULAR | Status: DC | PRN
  Administered 2014-08-18 (×4): 0.5 mg via INTRAVENOUS

## 2014-08-18 MED ORDER — DEXTROSE 5 % IV SOLN
INTRAVENOUS | Status: AC
  Filled 2014-08-18: qty 1.5

## 2014-08-18 MED ORDER — PROTAMINE SULFATE 10 MG/ML IV SOLN
INTRAVENOUS | Status: AC
  Filled 2014-08-18: qty 5

## 2014-08-18 MED ORDER — ONDANSETRON HCL 4 MG/2ML IJ SOLN
INTRAMUSCULAR | Status: AC
  Filled 2014-08-18: qty 2

## 2014-08-18 MED ORDER — ACETAMINOPHEN 160 MG/5ML PO SOLN
325.0000 mg | ORAL | Status: DC | PRN
  Filled 2014-08-18: qty 20.3

## 2014-08-18 MED ORDER — NEOSTIGMINE METHYLSULFATE 10 MG/10ML IV SOLN
INTRAVENOUS | Status: DC | PRN
  Administered 2014-08-18: 3 mg via INTRAVENOUS

## 2014-08-18 MED ORDER — OXYCODONE HCL 5 MG PO TABS
ORAL_TABLET | ORAL | Status: AC
  Administered 2014-08-18: 5 mg via ORAL
  Filled 2014-08-18: qty 1

## 2014-08-18 MED ORDER — ENOXAPARIN SODIUM 40 MG/0.4ML ~~LOC~~ SOLN
40.0000 mg | SUBCUTANEOUS | Status: DC
  Administered 2014-08-19: 40 mg via SUBCUTANEOUS
  Filled 2014-08-18 (×2): qty 0.4

## 2014-08-18 MED ORDER — MIDAZOLAM HCL 5 MG/5ML IJ SOLN
INTRAMUSCULAR | Status: DC | PRN
  Administered 2014-08-18 (×2): 1 mg via INTRAVENOUS

## 2014-08-18 MED ORDER — OXYCODONE-ACETAMINOPHEN 5-325 MG PO TABS
ORAL_TABLET | ORAL | Status: AC
  Administered 2014-08-18: 1 via ORAL
  Filled 2014-08-18: qty 1

## 2014-08-18 MED ORDER — POTASSIUM CHLORIDE 10 MEQ/100ML IV SOLN
INTRAVENOUS | Status: DC | PRN
  Administered 2014-08-18 (×2): 10 meq via INTRAVENOUS

## 2014-08-18 MED ORDER — POTASSIUM CHLORIDE 10 MEQ/100ML IV SOLN
10.0000 meq | INTRAVENOUS | Status: AC
  Administered 2014-08-18 (×3): 10 meq via INTRAVENOUS
  Filled 2014-08-18: qty 100

## 2014-08-18 MED ORDER — MORPHINE SULFATE (PF) 1 MG/ML IV SOLN
INTRAVENOUS | Status: AC
  Administered 2014-08-18: 19:00:00
  Filled 2014-08-18: qty 25

## 2014-08-18 MED ORDER — ROCURONIUM BROMIDE 100 MG/10ML IV SOLN
INTRAVENOUS | Status: DC | PRN
  Administered 2014-08-18 (×2): 50 mg via INTRAVENOUS

## 2014-08-18 MED ORDER — HEPARIN (PORCINE) IN NACL 100-0.45 UNIT/ML-% IJ SOLN
2000.0000 [IU]/h | INTRAMUSCULAR | Status: DC
  Filled 2014-08-18 (×2): qty 250

## 2014-08-18 MED ORDER — OXYCODONE HCL 5 MG PO TABS
5.0000 mg | ORAL_TABLET | Freq: Once | ORAL | Status: AC | PRN
  Administered 2014-08-18: 5 mg via ORAL

## 2014-08-18 MED ORDER — ONDANSETRON HCL 4 MG/2ML IJ SOLN
INTRAMUSCULAR | Status: DC | PRN
  Administered 2014-08-18: 4 mg via INTRAVENOUS

## 2014-08-18 MED ORDER — LABETALOL HCL 5 MG/ML IV SOLN
5.0000 mg | INTRAVENOUS | Status: DC | PRN
  Administered 2014-08-18 (×2): 5 mg via INTRAVENOUS

## 2014-08-18 MED ORDER — HEPARIN SODIUM (PORCINE) 1000 UNIT/ML IJ SOLN
INTRAMUSCULAR | Status: AC
  Filled 2014-08-18: qty 3

## 2014-08-18 MED ORDER — LABETALOL HCL 5 MG/ML IV SOLN
INTRAVENOUS | Status: DC | PRN
  Administered 2014-08-18: 10 mg via INTRAVENOUS
  Administered 2014-08-18 (×2): 5 mg via INTRAVENOUS

## 2014-08-18 MED ORDER — LACTATED RINGERS IV SOLN
INTRAVENOUS | Status: DC | PRN
  Administered 2014-08-18 (×4): via INTRAVENOUS

## 2014-08-18 MED ORDER — GLYCOPYRROLATE 0.2 MG/ML IJ SOLN
INTRAMUSCULAR | Status: DC | PRN
  Administered 2014-08-18: 0.2 mg via INTRAVENOUS

## 2014-08-18 MED ORDER — POTASSIUM CHLORIDE 10 MEQ/100ML IV SOLN
10.0000 meq | INTRAVENOUS | Status: AC
  Administered 2014-08-18: 10 meq via INTRAVENOUS
  Filled 2014-08-18 (×3): qty 100

## 2014-08-18 MED ORDER — ENOXAPARIN SODIUM 40 MG/0.4ML ~~LOC~~ SOLN: 40.0000 mg | SUBCUTANEOUS | Status: DC

## 2014-08-18 SURGICAL SUPPLY — 59 items
BENZOIN TINCTURE PRP APPL 2/3 (GAUZE/BANDAGES/DRESSINGS) ×6 IMPLANT
CANISTER SUCTION 2500CC (MISCELLANEOUS) ×2 IMPLANT
CANNULA VESSEL 3MM 2 BLNT TIP (CANNULA) ×2 IMPLANT
CATH EMB 4FR 80CM (CATHETERS) ×2 IMPLANT
CLIP LIGATING EXTRA MED SLVR (CLIP) ×4 IMPLANT
CLIP LIGATING EXTRA SM BLUE (MISCELLANEOUS) ×4 IMPLANT
CLSR STERI-STRIP ANTIMIC 1/2X4 (GAUZE/BANDAGES/DRESSINGS) ×4 IMPLANT
COVER SURGICAL LIGHT HANDLE (MISCELLANEOUS) IMPLANT
DRAIN SNY 10X20 3/4 PERF (WOUND CARE) IMPLANT
DRAPE INCISE IOBAN 66X45 STRL (DRAPES) ×4 IMPLANT
DRAPE X-RAY CASS 24X20 (DRAPES) IMPLANT
DRSG COVADERM 4X10 (GAUZE/BANDAGES/DRESSINGS) IMPLANT
DRSG COVADERM 4X8 (GAUZE/BANDAGES/DRESSINGS) IMPLANT
ELECT REM PT RETURN 9FT ADLT (ELECTROSURGICAL) ×4
ELECTRODE REM PT RTRN 9FT ADLT (ELECTROSURGICAL) ×2 IMPLANT
EVACUATOR SILICONE 100CC (DRAIN) IMPLANT
GAUZE SPONGE 4X4 12PLY STRL (GAUZE/BANDAGES/DRESSINGS) IMPLANT
GLOVE BIO SURGEON STRL SZ 6.5 (GLOVE) ×12 IMPLANT
GLOVE BIO SURGEON STRL SZ7.5 (GLOVE) ×2 IMPLANT
GLOVE BIOGEL PI IND STRL 6.5 (GLOVE) ×6 IMPLANT
GLOVE BIOGEL PI IND STRL 8 (GLOVE) ×2 IMPLANT
GLOVE BIOGEL PI INDICATOR 6.5 (GLOVE) ×6
GLOVE BIOGEL PI INDICATOR 8 (GLOVE) ×2
GLOVE SS BIOGEL STRL SZ 7.5 (GLOVE) ×1 IMPLANT
GLOVE SUPERSENSE BIOGEL SZ 7.5 (GLOVE) ×1
GLOVE SURG SS PI 8.0 STRL IVOR (GLOVE) ×2 IMPLANT
GOWN STRL REUS W/ TWL LRG LVL3 (GOWN DISPOSABLE) ×7 IMPLANT
GOWN STRL REUS W/TWL 2XL LVL3 (GOWN DISPOSABLE) ×2 IMPLANT
GOWN STRL REUS W/TWL LRG LVL3 (GOWN DISPOSABLE) ×7
GRAFT CV 60X8STRG TUBE KNTD (Vascular Products) ×1 IMPLANT
GRAFT HEMASHIELD 8MM (Vascular Products) ×2 IMPLANT
GRAFT VASC STRG 30X8KNIT (Vascular Products) ×1 IMPLANT
INSERT FOGARTY SM (MISCELLANEOUS) ×2 IMPLANT
KIT BASIN OR (CUSTOM PROCEDURE TRAY) ×2 IMPLANT
KIT ROOM TURNOVER OR (KITS) ×2 IMPLANT
NS IRRIG 1000ML POUR BTL (IV SOLUTION) ×4 IMPLANT
PACK PERIPHERAL VASCULAR (CUSTOM PROCEDURE TRAY) ×2 IMPLANT
PAD ARMBOARD 7.5X6 YLW CONV (MISCELLANEOUS) ×4 IMPLANT
SET COLLECT BLD 21X3/4 12 (NEEDLE) IMPLANT
SPONGE GAUZE 4X4 12PLY STER LF (GAUZE/BANDAGES/DRESSINGS) ×2 IMPLANT
SPONGE LAP 18X18 X RAY DECT (DISPOSABLE) ×2 IMPLANT
STAPLER VISISTAT 35W (STAPLE) IMPLANT
STOPCOCK 4 WAY LG BORE MALE ST (IV SETS) IMPLANT
STRIP CLOSURE SKIN 1/2X4 (GAUZE/BANDAGES/DRESSINGS) ×4 IMPLANT
SUT PROLENE 5 0 C 1 24 (SUTURE) ×6 IMPLANT
SUT PROLENE 5 0 CC 1 (SUTURE) ×6 IMPLANT
SUT PROLENE 6 0 CC (SUTURE) ×6 IMPLANT
SUT SILK 2 0 FS (SUTURE) IMPLANT
SUT SILK 2 0 SH (SUTURE) ×6 IMPLANT
SUT VIC AB 2-0 CTX 36 (SUTURE) ×6 IMPLANT
SUT VIC AB 3-0 SH 27 (SUTURE) ×3
SUT VIC AB 3-0 SH 27X BRD (SUTURE) ×3 IMPLANT
SUT VICRYL 4-0 PS2 18IN ABS (SUTURE) ×4 IMPLANT
SYR 3ML LL SCALE MARK (SYRINGE) ×2 IMPLANT
TAPE CLOTH SURG 4X10 WHT LF (GAUZE/BANDAGES/DRESSINGS) ×2 IMPLANT
TAPE STRIPS DRAPE STRL (GAUZE/BANDAGES/DRESSINGS) ×2 IMPLANT
TRAY FOLEY CATH 16FRSI W/METER (SET/KITS/TRAYS/PACK) ×2 IMPLANT
TUBING EXTENTION W/L.L. (IV SETS) IMPLANT
WATER STERILE IRR 1000ML POUR (IV SOLUTION) ×2 IMPLANT

## 2014-08-18 NOTE — Progress Notes (Signed)
Utilization review completed. Brayley Mackowiak, RN, BSN. 

## 2014-08-18 NOTE — Interval H&P Note (Signed)
History and Physical Interval Note:  08/18/2014 7:01 AM  [name] [name]  has presented today for surgery, with the diagnosis of Critical ischemia bilateral lower extremities  The various methods of treatment have been discussed with the patient and family. After consideration of risks, benefits and other options for treatment, the patient has consented to  Procedure(s): BYPASS GRAFT AXILLA-BIFEMORAL (Right) as a surgical intervention .  The patient's history has been reviewed, patient examined, no change in status, stable for surgery.  I have reviewed the patient's chart and labs.  Questions were answered to the patient's satisfaction.     [name]

## 2014-08-18 NOTE — Progress Notes (Signed)
Inpatient Diabetes Program Recommendations  AACE/ADA: New Consensus Statement on Inpatient Glycemic Control (2013)  Target Ranges:  Prepandial:   less than 140 mg/dL      Peak postprandial:   less than 180 mg/dL (1-2 hours)      Critically ill patients:  140 - 180 mg/dL   Reason for Visit: Consult to Diabetes Coordinator regarding elevated A1C and occluded grafts  Diabetes history: Type 2 diabetes Outpatient Diabetes medications: Glucotrol 10 mg daily, Lantus 40 units at HS, Glucophage 1000 mg bid, 70/30 bid by a sliding scale Current orders for Inpatient glycemic control: Lantus 40 units at HS, Glucotrol 10 mg daily, Moderate Novolog correction scale tid  Inpatient Diabetes Program Recommendations HgbA1C: =10.5  Note:  Patient is going for surgery soon.  Despina HiddenLynette Kinley is at his bedside.  Patient is obviously lethargic and dozing off and on.  Ms. Maurine CaneKinley states she is his Personal Care Assistant at home in his apartment for 3 hours/day.  Patient gives his own insulin and checks own CBG's.  Ms. Maurine CaneKinley prepares meals.  Patient goes to his family doctor, Dr. Mayford KnifeWilliams () monthly.  He has a mental health therapist that visits him weekly at his apartment.    Recommend short-term Home Health RN and Dietitian to evaluate and make recommendations regarding patient's diabetes self-care at home in an effort to improve glycemic control and hopefully delay further complications.  Outpatient diabetes education referral would not be appropriate in his case given his homebound status.  Despite lethargy, patient agreeable to this plan.    Thank you for the consult.  Chalonda Schlatter S. Elsie Lincolnouth, RN, CNS, CDE Inpatient Diabetes Program, team pager [phone]

## 2014-08-18 NOTE — H&P (View-Only) (Signed)
Patient ID: [name] [name], male   DOB: 04/04/1966, 48 y.o.   MRN: [id number] Discussed with Dr. fields and with the patient and his family present. 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 We did discuss about the potential nonhealing. His CT angiogram shows no flow at the common femoral artery level or iliac level on the left. We'll attempt a thrombectomy of this area and also thrombectomy of his right limb of his aortofemoral bypass. May require axillofemoral bypasses well since he has had failure of his femorofemoral based on his old aortofemoral graft which is failed as well. Surgery scheduled for tomorrow. 

## 2014-08-18 NOTE — Anesthesia Procedure Notes (Addendum)
Procedure Name: Intubation Date/Time: 08/18/2014 11:03 AM Performed by: Jerilee HohMUMM, Vieva Brummitt N Pre-anesthesia Checklist: Patient identified, Emergency Drugs available, Suction available and Patient being monitored Patient Re-evaluated:Patient Re-evaluated prior to inductionOxygen Delivery Method: Circle system utilized Preoxygenation: Pre-oxygenation with 100% oxygen Intubation Type: IV induction, Rapid sequence and Cricoid Pressure applied Ventilation: Oral airway inserted - appropriate to patient size and Mask ventilation without difficulty Laryngoscope Size: Mac and 4 Grade View: Grade III Tube type: Oral Tube size: 7.5 mm Number of attempts: 3 (DLx1 by SRNA revealed no view, DLx1 attempt by MDA revealed no view, mask ventilated, SpO2 100%, second DL by MDA with Mac 4 successfully intubated) Airway Equipment and Method: Stylet Placement Confirmation: ETT inserted through vocal cords under direct vision,  positive ETCO2 and breath sounds checked- equal and bilateral Secured at: 24 cm Tube secured with: Tape Dental Injury: Teeth and Oropharynx as per pre-operative assessment  Difficulty Due To: Difficulty was unanticipated and Difficult Airway- due to reduced neck mobility Future Recommendations: Recommend- induction with short-acting agent, and alternative techniques readily available

## 2014-08-18 NOTE — Anesthesia Preprocedure Evaluation (Signed)
Anesthesia Evaluation  Patient identified by MRN, date of birth, ID band Patient awake    Reviewed: Allergy & Precautions, NPO status , Patient's Chart, lab work & pertinent test results  History of Anesthesia Complications Negative for: history of anesthetic complications  Airway Mallampati: III  TM Distance: >3 FB Neck ROM: Full    Dental  (+) Poor Dentition   Pulmonary neg shortness of breath, neg sleep apnea, neg COPDCurrent Smoker,  breath sounds clear to auscultation        Cardiovascular hypertension, Pt. on medications - angina+ Peripheral Vascular Disease - Past MI Rhythm:Regular     Neuro/Psych PSYCHIATRIC DISORDERS Anxiety Depression Bipolar Disorder CVA    GI/Hepatic GERD-  Poorly Controlled,Elevated LFTs   Endo/Other  diabetes, Poorly Controlled, Type 2, Insulin Dependent, Oral Hypoglycemic Agents  Renal/GU      Musculoskeletal   Abdominal   Peds  Hematology   Anesthesia Other Findings   Reproductive/Obstetrics                             Anesthesia Physical Anesthesia Plan  ASA: III  Anesthesia Plan: General   Post-op Pain Management:    Induction: Intravenous, Rapid sequence and Cricoid pressure planned  Airway Management Planned: Oral ETT  Additional Equipment: None  Intra-op Plan:   Post-operative Plan: Extubation in OR  Informed Consent: I have reviewed the patients History and Physical, chart, labs and discussed the procedure including the risks, benefits and alternatives for the proposed anesthesia with the patient or authorized representative who has indicated his/her understanding and acceptance.   Dental advisory given  Plan Discussed with: CRNA and Surgeon  Anesthesia Plan Comments:         Anesthesia Quick Evaluation

## 2014-08-18 NOTE — Progress Notes (Signed)
Able to doppler faint popliteal pulse upon arrival to PACU. LLE cold to touch. Unable to doppler any LLE pulses upon arrival to [facility]. LLE cold & mottled.  Dr. Edilia Boickson contacted - Dr. Arbie CookeyEarly reported off that this pt's LLE is not salvageable, and pt will return to the O.R. Monday for further amputation. Relayed this information to [facility] RN

## 2014-08-18 NOTE — Progress Notes (Signed)
ANTICOAGULATION CONSULT NOTE - Follow Up Consult  Pharmacy Consult for heparin  Indication: recurrent aortobifemoral occlusion  No Known Allergies  Patient Measurements: Height: 6\' 2"  (188 cm) Weight: 214 lb 1.1 oz (97.1 kg) IBW/kg (Calculated) : 82.2 Heparin Dosing Weight: 97 kg  Vital Signs: Temp: 100 F (37.8 C) (02/19 0502) Temp Source: Oral (02/19 0502) BP: 146/77 mmHg (02/19 0502) Pulse Rate: 98 (02/19 0502)  Labs:  Recent Labs  08/16/14 1742  08/17/14 0028 08/17/14 0600 08/17/14 0945 08/17/14 1855 08/18/14 0600  HGB 11.4*  --  11.2*  --   --   --  10.3*  HCT 34.8*  --  34.0*  --   --   --  31.9*  PLT 317  --  317  --   --   --  304  LABPROT 15.1  --   --   --   --   --  15.4*  INR 1.18  --   --   --   --   --  1.20  HEPARINUNFRC  --   < > 0.45  --  <0.10* 0.31 0.16*  CREATININE 1.82*  --  1.42* 1.20  --   --  1.09  < > = values in this interval not displayed.  Estimated Creatinine Clearance: 96.4 mL/min (by C-G formula based on Cr of 1.09).   Assessment: 49 yo male on heparin for PVD with plan for right axillofemoral femorofemoral bypass on 2/19.  Heparin level is subtherapeutic at 0.16, no bleeding noted.  Planning to hold heparin on call to OR sometime today.  Goal of Therapy:  Heparin level 0.3-0.7 units/ml Monitor platelets by anticoagulation protocol: Yes   Plan:  -Increase IV heparin to 2000 units/hr. -Recheck heparin level in 6 hrs unless he goes to OR before then. -F/u plans for anticoagulation after OR.  Tad MooreJessica Kalena Mander, Pharm D, BCPS  Clinical Pharmacist Pager [phone]  08/18/2014 9:26 AM

## 2014-08-18 NOTE — Op Note (Signed)
OPERATIVE REPORT  DATE OF SURGERY: 08/18/2014  PATIENT: [name] [name], 49 y.o. male MRN: [id number]  DOB: 1965/11/01  PRE-OPERATIVE DIAGNOSIS: There are bilateral lower extremity ischemia with occluded aortofemoral graft and right left femorofemoral graft  POST-OPERATIVE DIAGNOSIS:  Same  PROCEDURE: Axillary to femoral bypass on the right with 8 mm Hemashield graft right to left femorofemoral bypass with 8 mm Hemashield graft  SURGEON:  Gretta Began, M.D.  PHYSICIAN ASSISTANT: Dr. Cari Caraway, [facility] PA-C Claris Pong PA-C  ANESTHESIA:  Gen.  EBL: Minimal ml  Total I/O In: 2600 [I.V.:2600] Out: 1275 [Urine:975; Blood:300]  BLOOD ADMINISTERED: None  DRAINS: None  SPECIMEN: None  COUNTS CORRECT:  YES  PLAN OF CARE: PACU   PATIENT DISPOSITION:  PACU - hemodynamically stable  PROCEDURE DETAILS: The patient was taken to the operative placed supine position where the area of both groins the right leg and the right chest up to the shoulder were prepped and draped in usual sterile fashion. Incision was made to the prior scar in the right groin and carried down through the scar tissue to the level of the prior placed right to left femorofemoral bypass and also the prior placed femoral aortofemoral bypass. The superficial femoral and profundus femoris arteries were isolated and encircled with Vesseloops. Separate incision was made over the left groin and carried down to the prior limb of the femorofemoral bypass and also the ulnar aortofemoral bypass. The superficial femoral and profundus was arteries were isolated here encircled with Vesseloops as well. Separate incision was made on the level below the subclavian artery on the right carried down by dividing the fibers of the pectoralis major muscle. Fascia was opened and the axillary artery was identified and was of good caliber. The vein was left superiorly. This was encircled with a vessel loop. A tunnel was created from the  right groin to the axillary incision and also tunnel was created from the right to the left groin. The patient was given 8000 units intravenous heparin circulation time the x-ray artery was occluded proximally and distally was opened with 11 blade some ulcerative Potts scissors. The asked the 8mm graft was sewn end-to-side to the axillary artery with a running 6-0 Prolene suture. Anastomosis was tested and found to be adequate. The graft was placed in the prior created tunnel down to the level of the common femoral artery on the right. The graft was flushed heparinized and reoccluded. The old graft from the aorto right femoral was occluded the superficial femoral to function was arteries were occluded as well. The prior placed femorofemoral bypass was divided was chronically occluded. The femorofemoral graft was removed starting as the old aortofemoral graft was exposed. The arteriotomy was continued down onto the common femoral artery below the old aortofemoral graft and this was resected with the old graft material. The patient had good backbleeding from the superficial femoral and from function was artery. The asked him bypass was brought down was spatulated and sewn end-to-side to the junction of the old femoral graft down onto the superficial femoral artery with a running 5-0 Prolene suture. Prior to completion of anastomosis the usual flushing maneuvers were completed. Anastomosis completed. Next attention was turned to the left groin. An 8 mm Hemashield graft was brought through the subcutaneous tunnel down to the left groin from the right groin. The old left limb of the right femorofemoral bypass was transected. This was also chronically occluded. Clot was removed from the common femoral artery and this  was chronically occluded as well. This was endarterectomized and extended down onto the superficial femoral and profundus femoris arteries on the left. Fogarty catheter was passed down to the level of the  knee withdrawn with pressure. Clot there was some backbleeding from superficial artery. The deep femoral artery was thrombectomized and excellent backbleeding from the profundus femoris artery on the left. The 8mm graft was spatulated and sewn to the junction of the common down onto the superficial femoral artery with a running 5-0 Prolene suture. Anastomosis was tested and found to be adequate. This was then brought through the prior created tunnel to the level of the axillofemoral bypass. The asked them was occluded proximally and distally and the graft was cut to appropriate length and was sewn end-to-side to the axilla femorofemoral bypass just approximately 3 cm above the right femoral anastomosis. After the usual flushing maneuvers clamps were removed and excellent flow was noted with good Doppler flow in the profundus reverse arteries bilaterally and down the right superficial femoral artery. Flow in the left superficial artery sounded occlusive. The patient was given 50 mg of protamine to reverse the heparin. Wounds irrigated with saline. Hemostasis tablet cautery. Wounds were closed with 2-0 Vicryl in the fascia skin was closed with 3 over 4 septic or Vicryl sutures. Sterile dressing was applied the patient was taken to the recovery room stable condition

## 2014-08-18 NOTE — Transfer of Care (Signed)
Immediate Anesthesia Transfer of Care Note  Patient: [name] [name]  Procedure(s) Performed: Procedure(s): RIGHT  AXILLO-BIFEMORAL ARTERY  BYPASS GRAFT (Bilateral)  Patient Location: PACU  Anesthesia Type:General  Level of Consciousness: awake and alert   Airway & Oxygen Therapy: Patient Spontanous Breathing and Patient connected to face mask oxygen  Post-op Assessment: Report given to RN, Post -op Vital signs reviewed and stable and Patient moving all extremities  Post vital signs: Reviewed and stable  Last Vitals:  Filed Vitals:   08/18/14 1621  BP:   Pulse:   Temp: 36.8 C  Resp:     Complications: No apparent anesthesia complications

## 2014-08-19 ENCOUNTER — Inpatient Hospital Stay (HOSPITAL_COMMUNITY): Payer: Medicaid Other

## 2014-08-19 ENCOUNTER — Encounter (HOSPITAL_COMMUNITY): Payer: Self-pay | Admitting: Vascular Surgery

## 2014-08-19 DIAGNOSIS — I999 Unspecified disorder of circulatory system: Secondary | ICD-10-CM

## 2014-08-19 DIAGNOSIS — R509 Fever, unspecified: Secondary | ICD-10-CM

## 2014-08-19 DIAGNOSIS — R34 Anuria and oliguria: Secondary | ICD-10-CM

## 2014-08-19 DIAGNOSIS — T8789 Other complications of amputation stump: Secondary | ICD-10-CM

## 2014-08-19 LAB — GLUCOSE, CAPILLARY
GLUCOSE-CAPILLARY: 137 mg/dL — AB (ref 70–99)
Glucose-Capillary: 171 mg/dL — ABNORMAL HIGH (ref 70–99)
Glucose-Capillary: 180 mg/dL — ABNORMAL HIGH (ref 70–99)

## 2014-08-19 LAB — BASIC METABOLIC PANEL
Anion gap: 8 (ref 5–15)
BUN: 9 mg/dL (ref 6–23)
CO2: 33 mmol/L — ABNORMAL HIGH (ref 19–32)
CREATININE: 1.29 mg/dL (ref 0.50–1.35)
Calcium: 8.1 mg/dL — ABNORMAL LOW (ref 8.4–10.5)
Chloride: 94 mmol/L — ABNORMAL LOW (ref 96–112)
GFR calc non Af Amer: 64 mL/min — ABNORMAL LOW (ref >90)
GFR, EST AFRICAN AMERICAN: 74 mL/min — AB (ref >90)
GLUCOSE: 170 mg/dL — AB (ref 70–99)
POTASSIUM: 3.1 mmol/L — AB (ref 3.5–5.1)
SODIUM: 135 mmol/L (ref 135–145)

## 2014-08-19 LAB — CBC
HCT: 28 % — ABNORMAL LOW (ref 39.0–52.0)
HEMOGLOBIN: 8.9 g/dL — AB (ref 13.0–17.0)
MCH: 27.1 pg (ref 26.0–34.0)
MCHC: 31.8 g/dL (ref 30.0–36.0)
MCV: 85.4 fL (ref 78.0–100.0)
Platelets: 266 10*3/uL (ref 150–400)
RBC: 3.28 MIL/uL — ABNORMAL LOW (ref 4.22–5.81)
RDW: 14 % (ref 11.5–15.5)
WBC: 11.4 10*3/uL — ABNORMAL HIGH (ref 4.0–10.5)

## 2014-08-19 LAB — URINE MICROSCOPIC-ADD ON

## 2014-08-19 LAB — URINALYSIS, ROUTINE W REFLEX MICROSCOPIC
Glucose, UA: NEGATIVE mg/dL
KETONES UR: 15 mg/dL — AB
NITRITE: NEGATIVE
PH: 6 (ref 5.0–8.0)
Protein, ur: 100 mg/dL — AB
SPECIFIC GRAVITY, URINE: 1.029 (ref 1.005–1.030)
Urobilinogen, UA: 1 mg/dL (ref 0.0–1.0)

## 2014-08-19 LAB — LACTIC ACID, PLASMA: LACTIC ACID, VENOUS: 1.7 mmol/L (ref 0.5–2.0)

## 2014-08-19 MED ORDER — PIPERACILLIN-TAZOBACTAM 3.375 G IVPB
3.3750 g | Freq: Three times a day (TID) | INTRAVENOUS | Status: DC
  Administered 2014-08-19 – 2014-08-24 (×17): 3.375 g via INTRAVENOUS
  Filled 2014-08-19 (×21): qty 50

## 2014-08-19 MED ORDER — VANCOMYCIN HCL 10 G IV SOLR
1250.0000 mg | Freq: Two times a day (BID) | INTRAVENOUS | Status: DC
  Administered 2014-08-19 – 2014-08-21 (×4): 1250 mg via INTRAVENOUS
  Filled 2014-08-19 (×6): qty 1250

## 2014-08-19 MED ORDER — POTASSIUM CHLORIDE IN NACL 20-0.9 MEQ/L-% IV SOLN
INTRAVENOUS | Status: DC
  Administered 2014-08-19 – 2014-08-21 (×3): via INTRAVENOUS
  Filled 2014-08-19 (×6): qty 1000

## 2014-08-19 MED ORDER — CETYLPYRIDINIUM CHLORIDE 0.05 % MT LIQD
7.0000 mL | Freq: Two times a day (BID) | OROMUCOSAL | Status: DC
  Administered 2014-08-19 – 2014-08-21 (×6): 7 mL via OROMUCOSAL

## 2014-08-19 MED ORDER — SODIUM CHLORIDE 0.9 % IV BOLUS (SEPSIS)
1000.0000 mL | Freq: Once | INTRAVENOUS | Status: AC
  Administered 2014-08-19: 1000 mL via INTRAVENOUS

## 2014-08-19 MED ORDER — OXYCODONE HCL 5 MG PO TABS
10.0000 mg | ORAL_TABLET | ORAL | Status: DC | PRN
  Administered 2014-08-19 – 2014-08-22 (×11): 10 mg via ORAL
  Filled 2014-08-19 (×10): qty 2

## 2014-08-19 MED ORDER — VANCOMYCIN HCL 10 G IV SOLR
1500.0000 mg | Freq: Once | INTRAVENOUS | Status: AC
  Administered 2014-08-19: 1500 mg via INTRAVENOUS
  Filled 2014-08-19: qty 1500

## 2014-08-19 NOTE — Progress Notes (Signed)
ANTIBIOTIC CONSULT NOTE - INITIAL  Pharmacy Consult for vanc/zosyn Indication: fever of unknown origin  No Known Allergies  Patient Measurements: Height: 6\' 2"  (188 cm) Weight: 214 lb 1.1 oz (97.1 kg) IBW/kg (Calculated) : 82.2  Vital Signs: Temp: 103.1 F (39.5 C) (02/20 0700) Temp Source: Axillary (02/20 0700) BP: 98/59 mmHg (02/20 0600) Pulse Rate: 103 (02/20 0600) Intake/Output from previous day: 02/19 0701 - 02/20 0700 In: 2840 [I.V.:2840] Out: 2040 [Urine:1740; Blood:300] Intake/Output from this shift: Total I/O In: -  Out: 125 [Urine:125]  Labs:  Recent Labs  08/18/14 0600 08/18/14 1451 08/18/14 2002 08/19/14 0710  WBC 12.1*  --  12.0* 11.4*  HGB 10.3* 9.5* 9.9* 8.9*  PLT 304  --  275 266  CREATININE 1.09  --  1.21 1.29   Estimated Creatinine Clearance: 81.4 mL/min (by C-G formula based on Cr of 1.29). No results for input(s): VANCOTROUGH, VANCOPEAK, VANCORANDOM, GENTTROUGH, GENTPEAK, GENTRANDOM, TOBRATROUGH, TOBRAPEAK, TOBRARND, AMIKACINPEAK, AMIKACINTROU, AMIKACIN in the last 72 hours.   Microbiology: Recent Results (from the past 720 hour(s))  MRSA PCR Screening     Status: None   Collection Time: 08/16/14  8:17 PM  Result Value Ref Range Status   MRSA by PCR NEGATIVE NEGATIVE Final    Comment:        The GeneXpert MRSA Assay (FDA approved for NASAL specimens only), is one component of a comprehensive MRSA colonization surveillance program. It is not intended to diagnose MRSA infection nor to guide or monitor treatment for MRSA infections.     Medical History: Past Medical History  Diagnosis Date  . GERD (gastroesophageal reflux disease)   . Nicotine addiction   . ETOH abuse   . Anxiety   . Hypertension     no pcp     saw peter nisham once  . Peripheral vascular disease   . Hyperlipidemia   . Collagen vascular disease   . Type II diabetes mellitus   . History of blood transfusion 08/2013    "maybe when he had his amputation"  .  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 Chronic lower back pain   . Depression   . Bipolar disorder    Assessment: 48 yom starting on vanc/zosyn for fever of unknown origin. S/p R axillofemoral femorofemoral bypass 2/19 - pt's LLE no salvageable, will return to OR Mon for further amputation. Tmax/24h 103.1, currently febrile. UA- many bacteria. To remove foley. Wbc stable 11.4. SCr stable 1.29, CrCl~80, wt 97.1kg  2/20 vanc >> 2/20 zosyn >>  2/20 BCx2 >>  Goal of Therapy:  Vancomycin trough level 15-20 mcg/ml  Plan:  -Start vanc 1500mg  x1; then 1250mg  q12h -Zosyn 3.375g q8h- 4-hr inf -F/u clinical progress, c/s, abx plan  Babs BertinHaley Antwonette Feliz, PharmD Clinical Pharmacist - Resident Pager [phone] 08/19/2014 8:30 AM

## 2014-08-19 NOTE — Progress Notes (Signed)
NP/N this am (1000) unable to locate L fem pulse dopplered, pt L upper leg warm to touch with less than 3 sec cap refill, MD/Dickson at BS able to locate/doppler graft site pulse, educated will not be able to locate fem pulse with doppler to assess skin for s/s of decrease circulation, nursing will cont to monitor 

## 2014-08-19 NOTE — Consult Note (Signed)
PULMONARY / CRITICAL CARE MEDICINE   Name: [name] [name] MRN: [id number] DOB: [birth year]    ADMISSION DATE:  08/16/2014 CONSULTATION DATE:  08/19/14   REFERRING MD :  Dr Edilia Boickson    CHIEF COMPLAINT:  Ams/ decreased uop/ fever ? 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  on 2/19 underwent  Axillary to femoral bypass on the right with 8 mm Hemashield graft right to left femorofemoral bypass with 8 mm Hemashield graft with ams/ fever to 103 2/20 with low  uop so PCCM asked to evaluate  STUDIES:     SIGNIFICANT EVENTS: D/c ACEi 08/19/14      HISTORY OF PRESENT ILLNESS:    extremely complex 49 year old gentleman who presented to the emergency room 08/16/13  with an ischemic left below-knee amputation stump. His past history is significant for stroke from a severe carotid disease and subsequent endarterectomy by Dr. Darrick Pennafields in February 2013. He had the bilateral lower extremity arterial insufficiency and underwent aortobifemoral bypass by Dr. Darrick Pennafields in July 2013 he subsequently presented with severe ischemia of his left leg and underwent CT angiogram in March 2015. This revealed occlusion of his right and left limb of his aortobifemoral bypass. Into the operating room on 08/31/2013 and underwent thrombectomies of his right and left limb of his aortofemoral bypass. Subsequent underwent left to right femorofemoral bypass and several days later underwent left below-knee amputation. He eventually healed below-knee amputation. Over the past several months he has had increasing pain in his left below-knee amputation. He initially had no skin breakdown but over the last several weeks has had skin breakdown over the tibia. He presented to the emergency room reporting worsening pain. He feels that his right leg is somewhat cold but does not note any acute change and has motor and sensory function intact in his right foot. He does not have any cardiac history   PAST MEDICAL  HISTORY :   has a past medical history of GERD (gastroesophageal reflux disease); Nicotine addiction; ETOH abuse; Anxiety; Hypertension; Peripheral vascular disease; Hyperlipidemia; Collagen vascular disease; Type II diabetes mellitus; History of blood transfusion (08/2013); Stroke (08/11/11); Chronic lower back pain; Depression; and Bipolar disorder.  has past surgical history that includes Abdominal exploration surgery (1970's); Endarterectomy (08/20/2011); Aorta - bilateral femoral artery bypass graft (01/12/2012); Axillary-femoral Bypass Graft (Bilateral, 08/31/2013); Amputation (Left, 09/05/2013); and Tonsillectomy. Prior to Admission medications   Medication Sig Start Date End Date Taking? Authorizing Provider  ALPRAZolam Prudy Feeler(XANAX) 1 MG tablet Take 1 mg by mouth 3 (three) times daily.   Yes Historical Provider, MD  amphetamine-dextroamphetamine (ADDERALL) 10 MG tablet Take 1 tablet (10 mg total) by mouth daily. Patient taking differently: Take 15 mg by mouth daily.  09/19/13  Yes Daniel J Angiulli, PA-C  aspirin 325 MG EC tablet Take 325 mg by mouth daily.   Yes Historical Provider, MD  atenolol (TENORMIN) 100 MG tablet Take 1 tablet (100 mg total) by mouth daily. Patient taking differently: Take 50 mg by mouth 2 (two) times daily.  09/19/13  Yes Daniel J Angiulli, PA-C  atorvastatin (LIPITOR) 40 MG tablet Take 40 mg by mouth every morning.   Yes Historical Provider, MD  buPROPion (WELLBUTRIN) 100 MG tablet Take 100 mg by mouth 3 (three) times daily.   Yes Historical Provider, MD  gabapentin (NEURONTIN) 300 MG capsule Take 1 capsule (300 mg total) by mouth 3 (three) times daily. 09/19/13  Yes Daniel J Angiulli, PA-C  glipiZIDE (GLUCOTROL) 10 MG tablet Take  10 mg by mouth daily before breakfast.   Yes Historical Provider, MD  hydrochlorothiazide (HYDRODIURIL) 25 MG tablet Take 1 tablet (25 mg total) by mouth daily. Patient taking differently: Take 50 mg by mouth daily.  09/19/13  Yes Daniel J Angiulli, PA-C   ibuprofen (ADVIL,MOTRIN) 200 MG tablet Take 400 mg by mouth every 6 (six) hours as needed for moderate pain.    Yes Historical Provider, MD  insulin glargine (LANTUS) 100 UNIT/ML injection Inject 0.4 mLs (40 Units total) into the skin at bedtime. 09/19/13  Yes Daniel J Angiulli, PA-C  insulin NPH-regular Human (NOVOLIN 70/30) (70-30) 100 UNIT/ML injection Inject 0-16 Units into the skin 2 (two) times daily with a meal. Sliding scale   Yes Historical Provider, MD  lisinopril (PRINIVIL,ZESTRIL) 20 MG tablet Take 1 tablet (20 mg total) by mouth 2 (two) times daily. 09/19/13  Yes Daniel J Angiulli, PA-C  metFORMIN (GLUCOPHAGE) 500 MG tablet Take 1 tablet (500 mg total) by mouth 2 (two) times daily with a meal. Patient taking differently: Take 1,000 mg by mouth 2 (two) times daily with a meal.  09/19/13  Yes Daniel J Angiulli, PA-C  naproxen (NAPROSYN) 500 MG tablet Take 500 mg by mouth 2 (two) times daily with a meal.   Yes Historical Provider, MD  omeprazole (PRILOSEC) 20 MG capsule Take 20 mg by mouth daily.   Yes Historical Provider, MD  oxyCODONE 20 MG TABS Take 1 tablet (20 mg total) by mouth every 4 (four) hours as needed for moderate pain. Patient taking differently: Take 10 mg by mouth every 8 (eight) hours.  09/19/13  Yes Daniel J Angiulli, PA-C  potassium chloride 20 MEQ TBCR Take 20 mEq by mouth daily. 09/19/13  Yes Daniel J Angiulli, PA-C  QUEtiapine (SEROQUEL) 50 MG tablet Take 1 tablet (50 mg total) by mouth at bedtime. Patient taking differently: Take 100 mg by mouth 3 (three) times daily.  09/19/13  Yes Daniel J Angiulli, PA-C  clonazePAM (KLONOPIN) 1 MG tablet Take 1 tablet (1 mg total) by mouth 2 (two) times daily. 09/19/13   Mcarthur Rossetti Angiulli, PA-C  collagenase (SANTYL) ointment Apply 1 application topically daily. 10/27/13   [name], MD  famotidine (PEPCID) 10 MG tablet Take 1 tablet (10 mg total) by mouth 2 (two) times daily. 09/19/13   Mcarthur Rossetti Angiulli, PA-C  FLUoxetine (PROZAC)  20 MG capsule Take 1 capsule (20 mg total) by mouth daily. Patient not taking: Reported on 06/13/2014 09/19/13   Mcarthur Rossetti Angiulli, PA-C  iron polysaccharides (NIFEREX) 150 MG capsule Take 1 capsule (150 mg total) by mouth 2 times daily at 12 noon and 4 pm. Patient not taking: Reported on 06/13/2014 09/19/13   Mcarthur Rossetti Angiulli, PA-C  methocarbamol (ROBAXIN) 750 MG tablet Take 1 tablet (750 mg total) by mouth 4 (four) times daily. Patient not taking: Reported on 06/13/2014 09/19/13   Mcarthur Rossetti Angiulli, PA-C  nicotine (NICODERM CQ - DOSED IN MG/24 HOURS) 14 mg/24hr patch 14 mg patch daily x2 weeks then 7 mg patch daily x2 weeks and stop Patient not taking: Reported on 06/13/2014 09/19/13   Mcarthur Rossetti Angiulli, PA-C  traMADol (ULTRAM) 50 MG tablet Take 50 mg by mouth every 6 (six) hours as needed.    Historical Provider, MD   No Known Allergies  FAMILY HISTORY:  indicated that his mother is alive. He indicated that his father is deceased.  SOCIAL HISTORY:  reports that he has been smoking Cigarettes.  He has a  2.16 pack-year smoking history. He has never used smokeless tobacco. He reports that he drinks about 24.0 oz of alcohol per week. He reports that he uses illicit drugs (Marijuana).     SUBJECTIVE: Uncomfortable variably confused bm sitting up to 45 degrees s increased wob  On 2lpm    VITAL SIGNS: Temp:  [98.2 F (36.8 C)-103.1 F (39.5 C)] 103.1 F (39.5 C) (02/20 0700) Pulse Rate:  [41-125] 105 (02/20 0900) Resp:  [14-26] 19 (02/20 0900) BP: (88-180)/(45-110) 91/65 mmHg (02/20 0900) SpO2:  [82 %-100 %] 97 % (02/20 0900) Arterial Line BP: (69-276)/(59-97) 82/75 mmHg (02/20 0900) HEMODYNAMICS:   VENTILATOR SETTINGS:   INTAKE / OUTPUT:  Intake/Output Summary (Last 24 hours) at 08/19/14 1015 Last data filed at 08/19/14 0741  Gross per 24 hour  Intake   2840 ml  Output   2165 ml  Net    675 ml    PHYSICAL EXAMINATION: General: chronically > acutely ill bm Pt alert, anxious,  knows he's in the hospital, I'm a doctor but no details of care  No jvd Oropharanx clear Neck supple Lungs with a few scattered exp > insp rhonchi bilaterally RRR no s3 or or sign murmur Abd obese with nl excursion, soft, not really tender, large umbilical hernia  Extr wam with no edema or clubbing noted Neuro  No motor deficits   LABS:  CBC  Recent Labs Lab 08/18/14 0600 08/18/14 1451 08/18/14 2002 08/19/14 0710  WBC 12.1*  --  12.0* 11.4*  HGB 10.3* 9.5* 9.9* 8.9*  HCT 31.9* 28.0* 30.5* 28.0*  PLT 304  --  275 266   Coag's  Recent Labs Lab 08/16/14 1742 08/18/14 0600  INR 1.18 1.20   BMET  Recent Labs Lab 08/17/14 0600 08/18/14 0600 08/18/14 1451 08/18/14 2002 08/19/14 0710  NA 136 136 136  --  135  K 2.8* 2.5* 2.7*  --  3.1*  CL 100 97  --   --  94*  CO2 26 31  --   --  33*  BUN 22 10  --   --  9  CREATININE 1.20 1.09  --  1.21 1.29  GLUCOSE 134* 131* 133*  --  170*   Electrolytes  Recent Labs Lab 08/17/14 0600 08/18/14 0600 08/19/14 0710  CALCIUM 8.8 8.7 8.1*   Sepsis Markers No results for input(s): LATICACIDVEN, PROCALCITON, O2SATVEN in the last 168 hours. ABG No results for input(s): PHART, PCO2ART, PO2ART in the last 168 hours. Liver Enzymes  Recent Labs Lab 08/16/14 1742  AST 150*  ALT 68*  ALKPHOS 78  BILITOT 0.5  ALBUMIN 3.2*   Cardiac Enzymes No results for input(s): TROPONINI, PROBNP in the last 168 hours. Glucose  Recent Labs Lab 08/17/14 2056 08/18/14 0651 08/18/14 0917 08/18/14 1627 08/18/14 2141 08/19/14 0829  GLUCAP 210* 125* 112* 135* 187* 171*    Imaging No results found.   ASSESSMENT / PLAN:  PULMONARY - 0 2 dep post op   rec  Check cxr/ monitor sats     CARDIOVASCULAR -severe hbp baseline, on ACEi and multiple longterm rx rec  Hold all rx for now      RENAL A: low uop post op  rec d/c acei/ fluid challenge     GASTROINTESTINAL A: ? Mild ileus post op  rec minimize narcotics      INFECTIOUS A: fever to 103 post op 2/20  BCx2  08/19/14 >>> UC 2/20 >>>   Abx:   Zosyn 2/20 >>> Vancomycin  2/20 >>>  No clear source, agree with broad coverage     NEUROLOGIC A: acute encephalopathy/ multifactorial Rec:  Supportive rx / minimize pain meds     FAMILY  - Updates: at bedside, no additional concerns verabalized    The patient is critically ill with multiple organ systems failure and requires high complexity decision making for assessment and support, frequent evaluation and titration of therapies, application of advanced monitoring technologies and extensive interpretation of multiple databases. Critical Care Time devoted to patient care services described in this note is 45 minutes.   Sandrea Hughs, MD Pulmonary and Critical Care Medicine [facility] Cell [phone] After 5:30 PM or weekends, call [phone]

## 2014-08-19 NOTE — Progress Notes (Signed)
eLink Physician-Brief Progress Note Patient Name: [name] LauthRobert [name] DOB: April 07, 1966 MRN: [id number]   Date of Service  08/19/2014  HPI/Events of Note  Soft bp, tachycardia, uop still low  eICU Interventions  NS x 1000 cc     Intervention Category Major Interventions: Shock - evaluation and management  Sandrea HughsMichael [place] 08/19/2014, 3:17 PM

## 2014-08-19 NOTE — Progress Notes (Signed)
Dr. Edilia Boickson was paged with regards to pts. Fluid and diet order. Pt. Also dosing on and off from the morphine PCA, sleepy but arousable. With po meds that was due earlier with order to hold for now. Will continue to monitor pt.

## 2014-08-19 NOTE — Progress Notes (Signed)
   08/19/14 2315  Vitals  Temp (!) 102.9 F (39.4 C)  Temp Source Oral  BP 107/78 mmHg  MAP (mmHg) 84  BP Location Right Arm  BP Method Automatic  Patient Position (if appropriate) Lying  Pulse Rate (!) 114  Pulse Rate Source Monitor  ECG Heart Rate (!) 115  Cardiac Rhythm ST  Resp (!) 23  650 mg tylenol given po

## 2014-08-19 NOTE — Anesthesia Postprocedure Evaluation (Signed)
  Anesthesia Post-op Note  Patient: [name] [name]  Procedure(s) Performed: Procedure(s): RIGHT  AXILLO-BIFEMORAL ARTERY  BYPASS GRAFT (Bilateral)  Patient Location: PACU  Anesthesia Type:General  Level of Consciousness: awake  Airway and Oxygen Therapy: Patient Spontanous Breathing  Post-op Pain: moderate  Post-op Assessment: Post-op Vital signs reviewed, Patient's Cardiovascular Status Stable, Respiratory Function Stable, Patent Airway, No signs of Nausea or vomiting and Pain level controlled  Post-op Vital Signs: Reviewed and stable  Last Vitals:  Filed Vitals:   08/19/14 0900  BP: 91/65  Pulse: 105  Temp:   Resp: 19    Complications: No apparent anesthesia complications

## 2014-08-19 NOTE — Progress Notes (Signed)
   VASCULAR SURGERY ASSESSMENT & PLAN:  * 1 Day Post-Op s/p: left axillobrachial bifemoral bypass  *  The left BKA from the mid thigh down does not appear viable. He will ultimately need a high above-the-knee amputation on the left. He could potentially even require hip disarticulation.  * Appreciate critical care medicine's help. He's being treated for sepsis. He is on vancomycin and Zosyn.  SUBJECTIVE: complains of pain in his left below-knee amputation.  PHYSICAL EXAM: Filed Vitals:   08/19/14 0724 08/19/14 0800 08/19/14 0827 08/19/14 0900  BP: 100/73  88/73 91/65  Pulse: 99  104 105  Temp:      TempSrc:      Resp: 21 16 23 19   Height:      Weight:      SpO2: 93% 99% 97% 97%   Right foot is warm. The left below the knee amputation is mottled from the mid thigh down which is not new. Incisions look fine.  LABS: Lab Results  Component Value Date   WBC 11.4* 08/19/2014   HGB 8.9* 08/19/2014   HCT 28.0* 08/19/2014   MCV 85.4 08/19/2014   PLT 266 08/19/2014   Lab Results  Component Value Date   CREATININE 1.29 08/19/2014   Lab Results  Component Value Date   INR 1.20 08/18/2014   CBG (last 3)   Recent Labs  08/18/14 1627 08/18/14 2141 08/19/14 0829  GLUCAP 135* 187* 171*    Active Problems:   Ischemia of site of left below knee amputation   Cari Carawayhris Gracielynn Birkel Beeper: [phone] 08/19/2014

## 2014-08-19 NOTE — Progress Notes (Signed)
Mr [name] [name] is still confused and combative at times.  His pain level is elevated and he is very agitated.  Repeated attempts to zero and flush his A-line have only resulted in momentary accurate readings.  He is restless and moves his arms frequently, placing them behind his head.  The foley is causing him discomfort and has been assessed for proper placement.  Good urine flow and volume noted.  Refusing SCD's at the present time.  Temp noted to be 102 oral and tylenol given.  Family is at the bedside trying to aid in lowering his agitation and encouraging him to use his PCA pump.  Oral pain and anxiety medications will also be utilized to reduce his agitation.

## 2014-08-19 NOTE — Progress Notes (Signed)
PT Cancellation Note  Patient Details Name: [name] LauthRobert Durden MRN: [id number] DOB: 1965/12/17   Cancelled Treatment:    Reason Eval/Treat Not Completed: Patient not medically ready.  Spoke with RN who indicates AMS and medical decline.  Will hold PT today and check back tomorrow as appropriate.     Bertil Brickey, Alison MurrayMegan F 08/19/2014, 10:40 AM

## 2014-08-19 NOTE — Progress Notes (Addendum)
Tm of 103.1 now.  Blood cx ordered.  U/a with many bacteria-will order cx.  Remove foley.  Vancomycin and Zosyn per pharmacy.  Doreatha MassedRHYNE, Frimet Durfee 08/19/2014 8:04 AM  Addendum:  CCM called and spoke with Dr. Sung AmabileSimonds.  CCM will see pt.  Doreatha MassedRHYNE, Oneta Sigman 08/19/2014 9:53 AM

## 2014-08-20 ENCOUNTER — Encounter (HOSPITAL_COMMUNITY): Payer: Self-pay | Admitting: Certified Registered"

## 2014-08-20 ENCOUNTER — Encounter (HOSPITAL_COMMUNITY)
Admission: EM | Disposition: A | Discharge status: Rehabilitation Facility | Payer: Self-pay | Source: Home / Self Care | Attending: Vascular Surgery

## 2014-08-20 ENCOUNTER — Inpatient Hospital Stay (HOSPITAL_COMMUNITY): Payer: Medicaid Other | Admitting: Certified Registered"

## 2014-08-20 DIAGNOSIS — Z9889 Other specified postprocedural states: Secondary | ICD-10-CM

## 2014-08-20 DIAGNOSIS — I70262 Atherosclerosis of native arteries of extremities with gangrene, left leg: Secondary | ICD-10-CM

## 2014-08-20 HISTORY — PX: AMPUTATION: SHX166

## 2014-08-20 LAB — BASIC METABOLIC PANEL
ANION GAP: 6 (ref 5–15)
Anion gap: 7 (ref 5–15)
BUN: 9 mg/dL (ref 6–23)
BUN: 9 mg/dL (ref 6–23)
CO2: 29 mmol/L (ref 19–32)
CO2: 30 mmol/L (ref 19–32)
CREATININE: 1.42 mg/dL — AB (ref 0.50–1.35)
CREATININE: 1.15 mg/dL (ref 0.50–1.35)
Calcium: 7.3 mg/dL — ABNORMAL LOW (ref 8.4–10.5)
Calcium: 7.1 mg/dL — ABNORMAL LOW (ref 8.4–10.5)
Chloride: 103 mmol/L (ref 96–112)
Chloride: 104 mmol/L (ref 96–112)
GFR calc Af Amer: 66 mL/min — ABNORMAL LOW (ref >90)
GFR calc Af Amer: 85 mL/min — ABNORMAL LOW (ref >90)
GFR, EST NON AFRICAN AMERICAN: 57 mL/min — AB (ref >90)
GFR, EST NON AFRICAN AMERICAN: 74 mL/min — AB (ref >90)
GLUCOSE: 109 mg/dL — AB (ref 70–99)
Glucose, Bld: 120 mg/dL — ABNORMAL HIGH (ref 70–99)
POTASSIUM: 2.9 mmol/L — AB (ref 3.5–5.1)
POTASSIUM: 3 mmol/L — AB (ref 3.5–5.1)
Sodium: 139 mmol/L (ref 135–145)
Sodium: 140 mmol/L (ref 135–145)

## 2014-08-20 LAB — GLUCOSE, CAPILLARY
GLUCOSE-CAPILLARY: 142 mg/dL — AB (ref 70–99)
Glucose-Capillary: 117 mg/dL — ABNORMAL HIGH (ref 70–99)
Glucose-Capillary: 94 mg/dL (ref 70–99)
Glucose-Capillary: 142 mg/dL — ABNORMAL HIGH (ref 70–99)
Glucose-Capillary: 135 mg/dL — ABNORMAL HIGH (ref 70–99)
Glucose-Capillary: 156 mg/dL — ABNORMAL HIGH (ref 70–99)

## 2014-08-20 LAB — CBC
HCT: 22 % — ABNORMAL LOW (ref 39.0–52.0)
HEMATOCRIT: 30.8 % — AB (ref 39.0–52.0)
HEMOGLOBIN: 10 g/dL — AB (ref 13.0–17.0)
Hemoglobin: 7.1 g/dL — ABNORMAL LOW (ref 13.0–17.0)
MCH: 27.4 pg (ref 26.0–34.0)
MCH: 27.3 pg (ref 26.0–34.0)
MCHC: 32.3 g/dL (ref 30.0–36.0)
MCHC: 32.5 g/dL (ref 30.0–36.0)
MCV: 84.9 fL (ref 78.0–100.0)
MCV: 84.2 fL (ref 78.0–100.0)
Platelets: 225 10*3/uL (ref 150–400)
Platelets: 206 10*3/uL (ref 150–400)
RBC: 2.59 MIL/uL — AB (ref 4.22–5.81)
RBC: 3.66 MIL/uL — AB (ref 4.22–5.81)
RDW: 14.4 % (ref 11.5–15.5)
RDW: 14.7 % (ref 11.5–15.5)
WBC: 9.7 10*3/uL (ref 4.0–10.5)
WBC: 11 10*3/uL — ABNORMAL HIGH (ref 4.0–10.5)

## 2014-08-20 LAB — PROTIME-INR
INR: 1.35 (ref 0.00–1.49)
INR: 1.29 (ref 0.00–1.49)
PROTHROMBIN TIME: 16.2 s — AB (ref 11.6–15.2)
Prothrombin Time: 16.8 seconds — ABNORMAL HIGH (ref 11.6–15.2)

## 2014-08-20 LAB — URINE CULTURE
CULTURE: NO GROWTH
Colony Count: NO GROWTH

## 2014-08-20 LAB — PREPARE RBC (CROSSMATCH)

## 2014-08-20 SURGERY — AMPUTATION, ABOVE KNEE
Anesthesia: General | Site: Leg Upper | Laterality: Left

## 2014-08-20 MED ORDER — MORPHINE SULFATE 2 MG/ML IJ SOLN
2.0000 mg | INTRAMUSCULAR | Status: DC | PRN
  Administered 2014-08-20 – 2014-08-21 (×7): 2 mg via INTRAVENOUS
  Filled 2014-08-20 (×2): qty 1
  Filled 2014-08-20: qty 2
  Filled 2014-08-20 (×2): qty 1
  Filled 2014-08-20: qty 2

## 2014-08-20 MED ORDER — 0.9 % SODIUM CHLORIDE (POUR BTL) OPTIME
TOPICAL | Status: DC | PRN
  Administered 2014-08-20: 1000 mL

## 2014-08-20 MED ORDER — SODIUM CHLORIDE 0.9 % IJ SOLN
INTRAMUSCULAR | Status: AC
  Filled 2014-08-20: qty 10

## 2014-08-20 MED ORDER — ENOXAPARIN SODIUM 40 MG/0.4ML ~~LOC~~ SOLN
40.0000 mg | SUBCUTANEOUS | Status: DC
Start: 2014-08-21 — End: 2014-08-24
  Administered 2014-08-21 – 2014-08-24 (×4): 40 mg via SUBCUTANEOUS
  Filled 2014-08-20 (×4): qty 0.4

## 2014-08-20 MED ORDER — ONDANSETRON HCL 4 MG/2ML IJ SOLN: 4.0000 mg | Freq: Once | INTRAMUSCULAR | Status: AC | PRN

## 2014-08-20 MED ORDER — FENTANYL CITRATE 0.05 MG/ML IJ SOLN
INTRAMUSCULAR | Status: AC
  Filled 2014-08-20: qty 5

## 2014-08-20 MED ORDER — SUCCINYLCHOLINE CHLORIDE 20 MG/ML IJ SOLN
INTRAMUSCULAR | Status: DC | PRN
  Administered 2014-08-20: 100 mg via INTRAVENOUS

## 2014-08-20 MED ORDER — POTASSIUM CHLORIDE 10 MEQ/100ML IV SOLN
10.0000 meq | INTRAVENOUS | Status: AC
Start: 2014-08-20 — End: 2014-08-20
  Administered 2014-08-20 (×3): 10 meq via INTRAVENOUS
  Filled 2014-08-20 (×4): qty 100

## 2014-08-20 MED ORDER — ACETAMINOPHEN 325 MG PO TABS
ORAL_TABLET | ORAL | Status: AC
  Filled 2014-08-20: qty 2

## 2014-08-20 MED ORDER — WHITE PETROLATUM GEL
Status: AC
  Administered 2014-08-20: 0.2
  Filled 2014-08-20: qty 1

## 2014-08-20 MED ORDER — OXYCODONE HCL 5 MG PO TABS
ORAL_TABLET | ORAL | Status: AC
  Filled 2014-08-20: qty 2

## 2014-08-20 MED ORDER — MORPHINE SULFATE 2 MG/ML IJ SOLN
INTRAMUSCULAR | Status: AC
  Filled 2014-08-20: qty 1

## 2014-08-20 MED ORDER — FENTANYL CITRATE 0.05 MG/ML IJ SOLN
INTRAMUSCULAR | Status: DC | PRN
  Administered 2014-08-20 (×4): 50 ug via INTRAVENOUS
  Administered 2014-08-20: 100 ug via INTRAVENOUS

## 2014-08-20 MED ORDER — PROPOFOL 10 MG/ML IV BOLUS
INTRAVENOUS | Status: AC
  Filled 2014-08-20: qty 20

## 2014-08-20 MED ORDER — KETAMINE HCL 100 MG/ML IJ SOLN
INTRAMUSCULAR | Status: AC
  Filled 2014-08-20: qty 1

## 2014-08-20 MED ORDER — LIDOCAINE HCL (CARDIAC) 20 MG/ML IV SOLN
INTRAVENOUS | Status: AC
  Filled 2014-08-20: qty 5

## 2014-08-20 MED ORDER — KETAMINE HCL 100 MG/ML IJ SOLN
INTRAMUSCULAR | Status: DC | PRN
  Administered 2014-08-20 (×2): 25 mg via INTRAVENOUS

## 2014-08-20 MED ORDER — ONDANSETRON HCL 4 MG/2ML IJ SOLN
INTRAMUSCULAR | Status: DC | PRN
  Administered 2014-08-20: 4 mg via INTRAVENOUS

## 2014-08-20 MED ORDER — PROPRANOLOL HCL 1 MG/ML IV SOLN
INTRAVENOUS | Status: AC
  Filled 2014-08-20: qty 1

## 2014-08-20 MED ORDER — ONDANSETRON HCL 4 MG/2ML IJ SOLN
INTRAMUSCULAR | Status: AC
  Filled 2014-08-20: qty 2

## 2014-08-20 MED ORDER — SODIUM CHLORIDE 0.9 % IV SOLN: Freq: Once | INTRAVENOUS | Status: AC

## 2014-08-20 MED ORDER — ASPIRIN EC 325 MG PO TBEC
325.0000 mg | DELAYED_RELEASE_TABLET | Freq: Every day | ORAL | Status: DC
  Administered 2014-08-21 – 2014-08-24 (×4): 325 mg via ORAL
  Filled 2014-08-20 (×4): qty 1

## 2014-08-20 MED ORDER — LACTATED RINGERS IV SOLN
INTRAVENOUS | Status: DC | PRN
  Administered 2014-08-20: 09:00:00 via INTRAVENOUS

## 2014-08-20 MED ORDER — DEXTROSE 50 % IV SOLN
INTRAVENOUS | Status: AC
  Filled 2014-08-20: qty 50

## 2014-08-20 MED ORDER — DEXTROSE 50 % IV SOLN
INTRAVENOUS | Status: DC | PRN
  Administered 2014-08-20: 6 g via INTRAVENOUS

## 2014-08-20 MED ORDER — LIDOCAINE HCL (CARDIAC) 20 MG/ML IV SOLN
INTRAVENOUS | Status: DC | PRN
  Administered 2014-08-20: 100 mg via INTRAVENOUS
  Administered 2014-08-20: 100 mg via INTRATRACHEAL

## 2014-08-20 MED ORDER — HYDROMORPHONE HCL 1 MG/ML IJ SOLN: 0.2500 mg | INTRAMUSCULAR | Status: DC | PRN

## 2014-08-20 MED ORDER — PROPRANOLOL HCL 1 MG/ML IV SOLN
INTRAVENOUS | Status: DC | PRN
  Administered 2014-08-20: 1 mg via INTRAVENOUS

## 2014-08-20 MED ORDER — PROPOFOL 10 MG/ML IV BOLUS
INTRAVENOUS | Status: DC | PRN
  Administered 2014-08-20: 180 mg via INTRAVENOUS

## 2014-08-20 MED ORDER — BACITRACIN ZINC 500 UNIT/GM EX OINT
TOPICAL_OINTMENT | CUTANEOUS | Status: DC | PRN
  Administered 2014-08-20: 1 via TOPICAL

## 2014-08-20 MED ORDER — SUCCINYLCHOLINE CHLORIDE 20 MG/ML IJ SOLN
INTRAMUSCULAR | Status: AC
  Filled 2014-08-20: qty 1

## 2014-08-20 MED ORDER — BACITRACIN ZINC 500 UNIT/GM EX OINT
TOPICAL_OINTMENT | CUTANEOUS | Status: AC
  Filled 2014-08-20: qty 28.35

## 2014-08-20 MED ORDER — POTASSIUM CHLORIDE 10 MEQ/100ML IV SOLN
10.0000 meq | INTRAVENOUS | Status: AC
  Administered 2014-08-20 – 2014-08-21 (×6): 10 meq via INTRAVENOUS
  Filled 2014-08-20 (×6): qty 100

## 2014-08-20 MED ORDER — SODIUM CHLORIDE 0.9 % IV SOLN: 10.0000 mL/h | Freq: Once | INTRAVENOUS | Status: DC

## 2014-08-20 SURGICAL SUPPLY — 54 items
BANDAGE ELASTIC 4 VELCRO ST LF (GAUZE/BANDAGES/DRESSINGS) ×3 IMPLANT
BANDAGE ELASTIC 6 VELCRO ST LF (GAUZE/BANDAGES/DRESSINGS) ×3 IMPLANT
BANDAGE ESMARK 6X9 LF (GAUZE/BANDAGES/DRESSINGS) IMPLANT
BLADE SAW RECIP 87.9 MT (BLADE) ×3 IMPLANT
BNDG COHESIVE 6X5 TAN STRL LF (GAUZE/BANDAGES/DRESSINGS) ×3 IMPLANT
BNDG ESMARK 6X9 LF (GAUZE/BANDAGES/DRESSINGS)
BNDG GAUZE ELAST 4 BULKY (GAUZE/BANDAGES/DRESSINGS) ×3 IMPLANT
CANISTER SUCTION 2500CC (MISCELLANEOUS) ×3 IMPLANT
CLIP TI MEDIUM 6 (CLIP) IMPLANT
COVER SURGICAL LIGHT HANDLE (MISCELLANEOUS) ×3 IMPLANT
CUFF TOURNIQUET SINGLE 18IN (TOURNIQUET CUFF) IMPLANT
CUFF TOURNIQUET SINGLE 24IN (TOURNIQUET CUFF) IMPLANT
CUFF TOURNIQUET SINGLE 34IN LL (TOURNIQUET CUFF) IMPLANT
CUFF TOURNIQUET SINGLE 44IN (TOURNIQUET CUFF) IMPLANT
DRAIN CHANNEL 19F RND (DRAIN) IMPLANT
DRAPE ORTHO SPLIT 77X108 STRL (DRAPES) ×4
DRAPE PROXIMA HALF (DRAPES) ×3 IMPLANT
DRAPE SURG ORHT 6 SPLT 77X108 (DRAPES) ×2 IMPLANT
DRAPE U-SHAPE 47X51 STRL (DRAPES) ×3 IMPLANT
DRSG ADAPTIC 3X8 NADH LF (GAUZE/BANDAGES/DRESSINGS) ×3 IMPLANT
DRSG PAD ABDOMINAL 8X10 ST (GAUZE/BANDAGES/DRESSINGS) ×3 IMPLANT
ELECT REM PT RETURN 9FT ADLT (ELECTROSURGICAL) ×3
ELECTRODE REM PT RTRN 9FT ADLT (ELECTROSURGICAL) ×1 IMPLANT
EVACUATOR SILICONE 100CC (DRAIN) IMPLANT
GAUZE SPONGE 4X4 12PLY STRL (GAUZE/BANDAGES/DRESSINGS) ×3 IMPLANT
GLOVE BIO SURGEON STRL SZ 6.5 (GLOVE) ×6 IMPLANT
GLOVE BIO SURGEON STRL SZ7.5 (GLOVE) ×3 IMPLANT
GLOVE BIO SURGEONS STRL SZ 6.5 (GLOVE) ×3
GLOVE BIOGEL PI IND STRL 6.5 (GLOVE) ×3 IMPLANT
GLOVE BIOGEL PI IND STRL 8 (GLOVE) ×1 IMPLANT
GLOVE BIOGEL PI INDICATOR 6.5 (GLOVE) ×6
GLOVE BIOGEL PI INDICATOR 8 (GLOVE) ×2
GOWN STRL REUS W/ TWL LRG LVL3 (GOWN DISPOSABLE) ×3 IMPLANT
GOWN STRL REUS W/TWL LRG LVL3 (GOWN DISPOSABLE) ×6
KIT BASIN OR (CUSTOM PROCEDURE TRAY) ×3 IMPLANT
KIT ROOM TURNOVER OR (KITS) ×3 IMPLANT
NS IRRIG 1000ML POUR BTL (IV SOLUTION) ×3 IMPLANT
PACK GENERAL/GYN (CUSTOM PROCEDURE TRAY) ×3 IMPLANT
PAD ARMBOARD 7.5X6 YLW CONV (MISCELLANEOUS) ×6 IMPLANT
PADDING CAST COTTON 6X4 STRL (CAST SUPPLIES) IMPLANT
SPONGE LAP 18X18 X RAY DECT (DISPOSABLE) ×9 IMPLANT
STAPLER VISISTAT (STAPLE) ×3 IMPLANT
STOCKINETTE IMPERVIOUS LG (DRAPES) ×3 IMPLANT
SUT ETHILON 3 0 PS 1 (SUTURE) IMPLANT
SUT SILK 0 TIES 10X30 (SUTURE) ×3 IMPLANT
SUT SILK 2 0 (SUTURE) ×2
SUT SILK 2 0 SH CR/8 (SUTURE) ×3 IMPLANT
SUT SILK 2-0 18XBRD TIE 12 (SUTURE) ×1 IMPLANT
SUT SILK 3 0 (SUTURE) ×2
SUT SILK 3-0 18XBRD TIE 12 (SUTURE) ×1 IMPLANT
SUT VIC AB 2-0 CT1 18 (SUTURE) ×6 IMPLANT
TOWEL OR 17X24 6PK STRL BLUE (TOWEL DISPOSABLE) ×6 IMPLANT
UNDERPAD 30X30 INCONTINENT (UNDERPADS AND DIAPERS) ×3 IMPLANT
WATER STERILE IRR 1000ML POUR (IV SOLUTION) ×3 IMPLANT

## 2014-08-20 NOTE — Op Note (Signed)
    NAME: [name] [name]    MRN: [id number] DOB: [birth year]    DATE OF OPERATION: 08/20/2014  PREOP DIAGNOSIS: Sepsis with gangrene of the left below-the-knee amputation stump  POSTOP DIAGNOSIS: same  PROCEDURE: left above-the-knee amputation  SURGEON: Di Kindlehristopher S. Edilia Boickson, MD, FACS  ASSIST: Doreatha MassedSamantha Rhyne, PA  ANESTHESIA: Gen.   EBL: 100 cc  INDICATIONS: [name] LauthRobert Sheldon is a 49 y.o. male with presented with an ischemic left below-the-knee amputation stump. He underwent right axillobifemoral bypass grafting by Dr. Tawanna Coolerodd Early 2 days ago. He became progressively septic with fevers, tachycardia, and hypotension. I felt that urgent above-the-knee amputation was indicated. He had modeling up to the proximal thigh and is at significant risk for nonhealing.  FINDINGS: The anterior compartment muscle did not appear viable and some areas. The posterior compartment muscles appeared to be reasonably well perfused.  TECHNIQUE: The patient was taken to the operating room and received a general anesthetic. The left lower extremity was prepped and draped in usual sterile fashion. A fishmouth incision was made above the area where the skin was mottled. There was not enough room to use a tourniquet. The skin incision was carried down to the fascia. Hemostasis was obtained using electrocautery. The fascia and muscle was divided using electrocautery and the dissection carried down to the femoral vessels which were suture ligated and divided between 2-0 silk ties. He was dissected free circumferentially. The periosteum was elevated. The bone was divided proximal to the level of skin division and the femur then divided with a saw. Hemostasis was obtained using electrocautery and 2-0 silk ties. The wound was irrigated with Saline. The Anterior Compartment Muscles Did Not Appear Viable and Therefore Using Sharp Dissection I Dissected This Further Back As there Was Enough Posterior Muscle I Felt to Cover the Bone. After  hemostasis was obtained, the fascial layer was closed with interrupted 20 Vicryls. The skin was closed with staples. Sterile dressing was applied. The patient tolerated the procedure well and was transferred to the recovery room in stable condition. All needle and sponge counts were correct.  Waverly Ferrarihristopher Marica Trentham, MD, FACS [facility]  DATE OF DICTATION:   08/20/2014

## 2014-08-20 NOTE — Progress Notes (Signed)
Paged Dr. Edilia Boickson about pt increased temps and increased mottling as well as increased confusion cooling  blanket and npo ordered will continue to monitor

## 2014-08-20 NOTE — Progress Notes (Signed)
   08/20/14 0400  Vitals  Temp (!) 102.8 F (39.3 C)  Temp Source Oral  BP (!) 98/56 mmHg  MAP (mmHg) 65  Pulse Rate (!) 114  ECG Heart Rate (!) 115  Resp (!) 32  650 mg tylenol given , ice placed under bilateral axilla

## 2014-08-20 NOTE — Progress Notes (Signed)
   VASCULAR SURGERY ASSESSMENT & PLAN:  * 2 Days Post-Op s/p: right axillobifemoral bypass graft.  *  The patient is clearly septic and the left below the knee amputation stump appears to be the source. He has a very complex history. He was admitted on 08/16/2014 with an ischemic left below-the-knee amputation stump. He had undergone an aortobifemoral bypass graft by Dr. Darrick PennaFields in 2013. This graft occluded and in March 2015 he had bilateral thrombectomies of the right and left limb of his graft. He later required a left to right femorofemoral bypass graft and underwent a left below-the-knee amputation. When he presented to the emergency department for several months he had been having increasing pain to the left below-the-knee amputation. It was not clear how long the amputation site had been ischemic. He had no flow at the level of the groins bilaterally. He underwent right axillobifemoral bypass graft 2 days ago. However he has shown increasing signs of sepsis with fever, tachycardia, and hypotension, despite fluid boluses and broad-spectrum antibiotics. I think the amputation site is the most likely source of sepsis and I think he we should proceed urgently with a high above-the-knee amputation on the left. I have explained that the main risk is that this this does not heal he would require a hip disarticulation in the future. In addition, I'm concerned that if there is ischemic muscle in the proximal thigh that this could become a life-threatening problem. We will proceed urgently today with high left above-the-knee amputation. We will continue his broad-spectrum antibiotics. I have discussed the situation with his girlfriend and also with his mother. We have discussed these risks and they are agreeable to proceed.  SUBJECTIVE: Confused  PHYSICAL EXAM: Filed Vitals:   08/20/14 0400 08/20/14 0500 08/20/14 0510 08/20/14 0600  BP: 98/56 89/48 103/52 95/65  Pulse: 114 114 116 110  Temp: 102.8 F (39.3  C)   101.7 F (38.7 C)  TempSrc: Oral   Oral  Resp: 32 26 27 23   Height:      Weight:      SpO2: 91% 97% 93% 98%   No change in modeling of left below the knee amputation stump. The mottling extends fairly high on the thigh.  LABS: Lab Results  Component Value Date   WBC 9.7 08/20/2014   HGB 7.1* 08/20/2014   HCT 22.0* 08/20/2014   MCV 84.9 08/20/2014   PLT 225 08/20/2014   Lab Results  Component Value Date   CREATININE 1.29 08/19/2014   Lab Results  Component Value Date   INR 1.20 08/18/2014   CBG (last 3)   Recent Labs  08/19/14 1146 08/19/14 1702 08/19/14 2155  GLUCAP 180* 137* 117*    Active Problems:   Ischemia of site of left below knee amputation   Low urine output   Fever of undetermined origin  Cari CarawayChris Dickson Beeper: [phone] 08/20/2014

## 2014-08-20 NOTE — Anesthesia Preprocedure Evaluation (Addendum)
Anesthesia Evaluation  Patient identified by MRN, date of birth, ID band Patient awake    Reviewed: Allergy & Precautions, NPO status , Patient's Chart, lab work & pertinent test results  Airway        Dental   Pulmonary Current Smoker,          Cardiovascular hypertension, + Peripheral Vascular Disease     Neuro/Psych Anxiety Depression CVA    GI/Hepatic GERD-  ,(+)     substance abuse  marijuana use,   Endo/Other  diabetes, Type 2, Oral Hypoglycemic Agents, Insulin Dependent  Renal/GU Renal InsufficiencyRenal disease     Musculoskeletal   Abdominal   Peds  Hematology   Anesthesia Other Findings   Reproductive/Obstetrics                            Anesthesia Physical Anesthesia Plan  ASA: III  Anesthesia Plan: General   Post-op Pain Management:    Induction: Intravenous  Airway Management Planned: Oral ETT  Additional Equipment:   Intra-op Plan:   Post-operative Plan: Extubation in OR  Informed Consent: I have reviewed the patients History and Physical, chart, labs and discussed the procedure including the risks, benefits and alternatives for the proposed anesthesia with the patient or authorized representative who has indicated his/her understanding and acceptance.     Plan Discussed with: CRNA, Anesthesiologist and Surgeon  Anesthesia Plan Comments:         Anesthesia Quick Evaluation

## 2014-08-20 NOTE — Progress Notes (Signed)
VASCULAR LAB PRELIMINARY  ARTERIAL  ABI completed:    RIGHT    LEFT    PRESSURE WAVEFORM  PRESSURE WAVEFORM  BRACHIAL 120 Triphasic  BRACHIAL    DP   DP    AT 106 Monophasic  AT    PT 99 Monophasic  PT    PER   PER    GREAT TOE  NA GREAT TOE  NA    RIGHT LEFT  ABI 0.88 AKA     Lyon Dumont, RVT 08/20/2014, 3:16 PM

## 2014-08-20 NOTE — Progress Notes (Signed)
eLink Physician-Brief Progress Note Patient Name: [name] LauthRobert [name] DOB: 1966-03-20 MRN: [id number]   Date of Service  08/20/2014  HPI/Events of Note  Patient s/p surgery today on leg.  Post op lethargy.  Arousable with stable VS.  Will arouse and answer simple questions.  eICU Interventions  Ok to hold oral medications for now especially sedating medications. Repeat BMP at 1700 to check potassium     Intervention Category Intermediate Interventions: Other:  Henry RusselSMITH, Oma Alpert, Demetrius Charity 08/20/2014, 3:43 PM

## 2014-08-20 NOTE — Progress Notes (Signed)
PULMONARY / CRITICAL CARE MEDICINE   Name: [name] LauthRobert Coriz MRN: [id number] DOB: [birth year]    ADMISSION DATE:  08/16/2014 CONSULTATION DATE:  08/19/14   REFERRING MD :  Dr Edilia Boickson    CHIEF COMPLAINT:  Ams/ decreased uop/ fever ? 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 stump infection      SUBJECTIVE: Uncomfortable variably confused bm sitting up to 45 degrees with mild increased wob/     VITAL SIGNS: Temp:  [99.4 F (37.4 C)-104.2 F (40.1 C)] 102 F (38.9 C) (02/21 0900) Pulse Rate:  [104-117] 117 (02/21 0900) Resp:  [0-32] 21 (02/21 0900) BP: (85-137)/(48-121) 132/81 mmHg (02/21 0900) SpO2:  [90 %-100 %] 100 % (02/21 0900) Arterial Line BP: (102)/(96) 102/96 mmHg (02/20 1000) HEMODYNAMICS:   VENTILATOR SETTINGS:   INTAKE / OUTPUT:  Intake/Output Summary (Last 24 hours) at 08/20/14 0947 Last data filed at 08/20/14 0700  Gross per 24 hour  Intake   2880 ml  Output   1125 ml  Net   1755 ml    PHYSICAL EXAMINATION: General:  acutely ill bm mild increased wob  Pt alert, anxious, knows he's in the hospital, I'm a doctor but no details of care  No jvd Oropharynx clear Neck supple Lungs with a few scattered exp > insp rhonchi bilaterally RRR no s3 or or sign murmur Abd obese with nl excursion, soft, not really tender, large umbilical hernia  Extr warm on R, cool on Left with ? Infected stump per Dr Edilia Boickson Neuro  No motor deficits   LABS:  CBC  Recent Labs Lab 08/18/14 2002 08/19/14 0710 08/20/14 0312  WBC 12.0* 11.4* 9.7  HGB 9.9* 8.9* 7.1*  HCT 30.5* 28.0* 22.0*  PLT 275 266 225    Coag's  Recent Labs Lab 08/16/14 1742 08/18/14 0600 08/20/14 0757  INR 1.18 1.20 1.35   BMET  Recent Labs Lab 08/18/14 0600 08/18/14 1451 08/18/14 2002 08/19/14 0710 08/20/14 0757  NA 136 136  --  135 139  K 2.5* 2.7*  --  3.1* 2.9*  CL 97  --   --  94* 103  CO2 31  --   --  33* 29  BUN 10  --   --  9 9  CREATININE 1.09  --  1.21 1.29 1.42*  GLUCOSE 131* 133*  --  170* 109*   Electrolytes  Recent Labs Lab 08/18/14 0600 08/19/14 0710 08/20/14 0757  CALCIUM 8.7 8.1* 7.3*   Sepsis Markers  Recent Labs Lab 08/19/14 1140  LATICACIDVEN 1.7   ABG No results for input(s): PHART, PCO2ART, PO2ART in the last 168 hours. Liver Enzymes  Recent Labs Lab 08/16/14 1742  AST 150*  ALT 68*  ALKPHOS 78  BILITOT 0.5  ALBUMIN 3.2*   Cardiac Enzymes No results for input(s): TROPONINI, PROBNP in the last 168 hours. Glucose  Recent Labs Lab 08/18/14 2141 08/19/14 0829 08/19/14 1146 08/19/14 1702 08/19/14 2155 08/20/14 0744  GLUCAP 187* 171* 180* 137* 117* 94    Imaging Dg Chest Port 1 View  08/19/2014   CLINICAL  DATA:  Fever  EXAM: PORTABLE CHEST - 1 VIEW  COMPARISON:  08/16/2014  FINDINGS: Borderline cardiomegaly. No pulmonary edema. There is hazy bilateral basilar atelectasis or early infiltrate.  IMPRESSION: No pulmonary edema. Hazy bilateral basilar atelectasis or early infiltrate. Borderline cardiomegaly.   Electronically Signed   By: Natasha Mead M.D.   On: 08/19/2014 11:41     ASSESSMENT / PLAN:  PULMONARY - 0 2 dep post op secondary to sepsis/ atx no chf   rec titrate 02 as needed, high risk ards post op but as far as I can tell now should be able to extubate post op        CARDIOVASCULAR -severe hbp baseline, on ACEi and multiple longterm rx rec  Hold all rx for now / keep hydrated      RENAL A: low uop post op  Better p vol challenges 2/20 though renal fxn has deteriorated rec Keep hydrated / may benefit from CVL perioperatively      GASTROINTESTINAL A: ? Mild ileus post op  rec minimize narcotics     INFECTIOUS A: fever to 103 post op 2/20 no better 2/21 BCx2  08/19/14 >>> UC 2/20 >>>   Abx:   Zosyn 2/20 >>> Vancomycin 2/20 >>>    agree with broad coverage / plans for aka 2/22   Heme Acute blood loss anemia post op/ also partly dilutional  rec tx per vascular surgery     NEUROLOGIC A: acute encephalopathy/ multifactorial Rec:  Supportive rx / minimize pain meds     FAMILY  - Updates: at bedside, no additional concerns verabalized     The patient is critically ill with multiple organ systems failure and requires high complexity decision making for assessment and support, frequent evaluation and titration of therapies, application of advanced monitoring technologies and extensive interpretation of multiple databases. Critical Care Time devoted to patient care services described in this note is 45 minutes.    We can see again post op   or monitor at elink, whichever is needed.    Sandrea Hughs, MD Pulmonary and Critical Care Medicine [facility] Cell [phone] After 5:30 PM or weekends, call [phone]

## 2014-08-20 NOTE — Anesthesia Postprocedure Evaluation (Signed)
  Anesthesia Post-op Note  Patient: [name] [name]  Procedure(s) Performed: Procedure(s): AMPUTATION ABOVE KNEE (Left)  Patient Location: PACU  Anesthesia Type:General  Level of Consciousness: awake, oriented, sedated and patient cooperative  Airway and Oxygen Therapy: Patient Spontanous Breathing  Post-op Pain: moderate  Post-op Assessment: Post-op Vital signs reviewed, Patient's Cardiovascular Status Stable, Respiratory Function Stable, Patent Airway, No signs of Nausea or vomiting and Pain level controlled  Post-op Vital Signs: stable  Last Vitals:  Filed Vitals:   08/20/14 1230  BP: 103/70  Pulse: 105  Temp:   Resp: 17    Complications: No apparent anesthesia complications

## 2014-08-20 NOTE — Progress Notes (Signed)
eLink Physician-Brief Progress Note Patient Name: [name] LauthRobert [name] DOB: May 21, 1966 MRN: [id number]   Date of Service  08/20/2014  HPI/Events of Note  k 3.0 at 1700  eICU Interventions  replaced     Intervention Category Minor Interventions: Electrolytes abnormality - evaluation and management  Henry RusselSMITH, Basel Defalco, P 08/20/2014, 9:36 PM

## 2014-08-20 NOTE — Progress Notes (Signed)
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

## 2014-08-20 NOTE — Anesthesia Procedure Notes (Signed)
Procedure Name: Intubation Date/Time: 08/20/2014 10:15 AM Performed by: Jerilee HohMUMM, Shimeka Bacot N Pre-anesthesia Checklist: Patient identified, Emergency Drugs available, Suction available and Patient being monitored Patient Re-evaluated:Patient Re-evaluated prior to inductionOxygen Delivery Method: Circle system utilized Preoxygenation: Pre-oxygenation with 100% oxygen Intubation Type: IV induction Ventilation: Mask ventilation without difficulty Laryngoscope Size: Mac and 4 Grade View: Grade I Tube type: Oral Tube size: 7.5 mm Number of attempts: 1 Airway Equipment and Method: Stylet Placement Confirmation: ETT inserted through vocal cords under direct vision,  positive ETCO2 and breath sounds checked- equal and bilateral Secured at: 22 cm Tube secured with: Tape Dental Injury: Teeth and Oropharynx as per pre-operative assessment

## 2014-08-20 NOTE — Transfer of Care (Signed)
Immediate Anesthesia Transfer of Care Note  Patient: [name] [name]  Procedure(s) Performed: Procedure(s): AMPUTATION ABOVE KNEE (Left)  Patient Location: PACU  Anesthesia Type:General  Level of Consciousness: sedated and responds to stimulation  Airway & Oxygen Therapy: Patient Spontanous Breathing and Patient connected to face mask oxygen  Post-op Assessment: Report given to RN and Post -op Vital signs reviewed and stable  Post vital signs: Reviewed and stable  Last Vitals:  Filed Vitals:   08/20/14 1154  BP: 95/86  Pulse: 106  Temp: 36.8 C  Resp: 18    Complications: No apparent anesthesia complications

## 2014-08-21 ENCOUNTER — Encounter (HOSPITAL_COMMUNITY): Payer: Self-pay | Admitting: Vascular Surgery

## 2014-08-21 DIAGNOSIS — J811 Chronic pulmonary edema: Secondary | ICD-10-CM | POA: Insufficient documentation

## 2014-08-21 DIAGNOSIS — Z89612 Acquired absence of left leg above knee: Secondary | ICD-10-CM

## 2014-08-21 DIAGNOSIS — R509 Fever, unspecified: Secondary | ICD-10-CM | POA: Insufficient documentation

## 2014-08-21 DIAGNOSIS — Z419 Encounter for procedure for purposes other than remedying health state, unspecified: Secondary | ICD-10-CM | POA: Insufficient documentation

## 2014-08-21 LAB — TYPE AND SCREEN
ABO/RH(D): A POS
Antibody Screen: NEGATIVE
UNIT DIVISION: 0
Unit division: 0
Unit division: 0
Unit division: 0

## 2014-08-21 LAB — BASIC METABOLIC PANEL
ANION GAP: 8 (ref 5–15)
BUN: 6 mg/dL (ref 6–23)
CO2: 25 mmol/L (ref 19–32)
Calcium: 7 mg/dL — ABNORMAL LOW (ref 8.4–10.5)
Chloride: 104 mmol/L (ref 96–112)
Creatinine, Ser: 1.15 mg/dL (ref 0.50–1.35)
GFR calc non Af Amer: 74 mL/min — ABNORMAL LOW (ref >90)
GFR, EST AFRICAN AMERICAN: 85 mL/min — AB (ref >90)
Glucose, Bld: 127 mg/dL — ABNORMAL HIGH (ref 70–99)
Potassium: 3.2 mmol/L — ABNORMAL LOW (ref 3.5–5.1)
Sodium: 137 mmol/L (ref 135–145)

## 2014-08-21 LAB — GLUCOSE, CAPILLARY
GLUCOSE-CAPILLARY: 114 mg/dL — AB (ref 70–99)
GLUCOSE-CAPILLARY: 124 mg/dL — AB (ref 70–99)
Glucose-Capillary: 133 mg/dL — ABNORMAL HIGH (ref 70–99)

## 2014-08-21 LAB — CBC
HCT: 28.1 % — ABNORMAL LOW (ref 39.0–52.0)
Hemoglobin: 9.4 g/dL — ABNORMAL LOW (ref 13.0–17.0)
MCH: 27.9 pg (ref 26.0–34.0)
MCHC: 33.5 g/dL (ref 30.0–36.0)
MCV: 83.4 fL (ref 78.0–100.0)
Platelets: 206 10*3/uL (ref 150–400)
RBC: 3.37 MIL/uL — ABNORMAL LOW (ref 4.22–5.81)
RDW: 14.6 % (ref 11.5–15.5)
WBC: 9.9 10*3/uL (ref 4.0–10.5)

## 2014-08-21 MED ORDER — FENTANYL 25 MCG/HR TD PT72
25.0000 ug | MEDICATED_PATCH | TRANSDERMAL | Status: DC
  Administered 2014-08-21 – 2014-08-24 (×2): 25 ug via TRANSDERMAL
  Filled 2014-08-21: qty 1
  Filled 2014-08-21: qty 2

## 2014-08-21 MED ORDER — POTASSIUM CHLORIDE CRYS ER 20 MEQ PO TBCR
40.0000 meq | EXTENDED_RELEASE_TABLET | ORAL | Status: AC
Start: 2014-08-21 — End: 2014-08-21
  Administered 2014-08-21 (×2): 40 meq via ORAL
  Filled 2014-08-21 (×2): qty 2

## 2014-08-21 MED ORDER — GABAPENTIN 300 MG PO CAPS
300.0000 mg | ORAL_CAPSULE | Freq: Three times a day (TID) | ORAL | Status: DC
  Administered 2014-08-21 – 2014-08-24 (×9): 300 mg via ORAL
  Filled 2014-08-21 (×11): qty 1

## 2014-08-21 MED ORDER — WHITE PETROLATUM GEL
Status: AC
  Administered 2014-08-21: 14:00:00
  Filled 2014-08-21: qty 1

## 2014-08-21 MED ORDER — QUETIAPINE FUMARATE 50 MG PO TABS
50.0000 mg | ORAL_TABLET | Freq: Three times a day (TID) | ORAL | Status: DC
  Administered 2014-08-21 – 2014-08-24 (×10): 50 mg via ORAL
  Filled 2014-08-21 (×11): qty 1

## 2014-08-21 NOTE — Evaluation (Signed)
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 He transferred to his w/c and BSC independently.  Pt was primarily dependent on his w/c  for mobility.  Pt presents with lethargy and impaired cognition interfering with ability to participate in bed mobility, MMT and ADL.  Will follow acutely. Pt will need post acute rehab prior to return home.    Follow Up Recommendations  CIR    Equipment Recommendations  None recommended by OT    Recommendations for Other Services       Precautions / Restrictions Precautions Precautions: Fall Precaution Comments: L AKA Restrictions Weight Bearing Restrictions: No      Mobility Bed Mobility Overal bed mobility: Needs Assistance;+2 for physical assistance Bed Mobility: Rolling;Supine to Sit;Sit to Supine Rolling: Max assist   Supine to sit: Total assist;+2 for physical assistance Sit to supine: +2 for physical assistance;Max assist   General bed mobility comments: Pt with resistance to sitting EOB. Rolled and repositioned for comfort.  Transfers                      Balance Overall balance assessment: Needs assistance   Sitting balance-Leahy Scale: Poor                                      ADL Overall ADL's : Needs assistance/impaired                                       General ADL Comments: Pt currently requiring total assist due to impaired cognition and lethargy.      Vision     Perception     Praxis      Pertinent Vitals/Pain Pain Assessment: Faces Pain Score: 6  Faces Pain Scale: Hurts little more Pain Location: L LE Pain Descriptors / Indicators: Grimacing;Guarding Pain Intervention(s): Repositioned;Premedicated before session;Monitored during session;Limited activity within patient's tolerance     Hand Dominance Left   Extremity/Trunk Assessment Upper Extremity Assessment Upper Extremity Assessment: Generalized weakness;Difficult to assess due to impaired cognition   Lower Extremity Assessment Lower Extremity Assessment: Defer to PT evaluation       Communication Communication Communication: No difficulties   Cognition Arousal/Alertness: Lethargic Behavior During Therapy: Flat affect;Impulsive Overall Cognitive Status: Impaired/Different from baseline Area of Impairment: Orientation;Attention;Memory;Following commands;Safety/judgement;Awareness;Problem solving Orientation Level: Disoriented to;Place;Time;Situation Current Attention Level: Focused Memory: Decreased recall of precautions;Decreased short-term memory Following Commands: Follows one step commands inconsistently Safety/Judgement: Decreased awareness of deficits   Problem Solving: Slow processing;Decreased initiation;Difficulty sequencing;Requires verbal cues;Requires tactile cues General Comments: pt very confused, difficulty processing all commands or questions   General Comments       Exercises       Shoulder Instructions      Home Living Family/patient expects to be discharged to:: Private residence Living  Arrangements: Alone Available Help at Discharge: Friend(s);Available 24 hours/day Type of Home: Apartment Home Access: Stairs to enter Entrance Stairs-Number of Steps: 1 Entrance Stairs-Rails: None Home Layout: One level     Bathroom Shower/Tub: Arts development officerCurtain;Tub/shower unit   Bathroom Toilet: Standard Bathroom Accessibility: No (walks sideways  with RW through door)   Home Equipment: Walker - 2 wheels;Bedside commode;Tub bench;Wheelchair - manual   Additional Comments: PLOF provided by girlfriend due to pt cognition      Prior Functioning/Environment Level of Independence: Needs assistance  Gait / Transfers Assistance Needed: pt can typically perform transfers and gait in home mod I with RW ADL's / Homemaking Assistance Needed: girlfriend/caregiver helps with bathing and dressing at time and she performs the housework and cooking        OT Diagnosis: Generalized weakness;Cognitive deficits;Acute pain   OT Problem List: Decreased strength;Decreased activity tolerance;Impaired balance (sitting and/or standing);Decreased coordination;Decreased cognition;Decreased safety awareness;Obesity;Pain   OT Treatment/Interventions: Self-care/ADL training;DME and/or AE instruction;Therapeutic activities;Cognitive remediation/compensation;Patient/family education;Balance training;Therapeutic exercise    OT Goals(Current goals can be found in the care plan section) Acute Rehab OT Goals Patient Stated Goal: return home OT Goal Formulation: Patient unable to participate in goal setting Time For Goal Achievement: 09/04/14 Potential to Achieve Goals: Good ADL Goals Pt Will Perform Grooming: with supervision;sitting Pt Will Transfer to Toilet: with min assist;stand pivot transfer;bedside commode Pt Will Perform Toileting - Clothing Manipulation and hygiene: with min assist;sit to/from stand Additional ADL Goal #1: Pt will perform bed mobility with min assist in preparation for ADL at EOB. Additional ADL Goal #2: Pt will perform chair push ups for pressure relief and UE strengthening x 10 reps. Additional ADL Goal #3: Pt will follow one step commands 75% of requests.  OT Frequency: Min 2X/week   Barriers to D/C:            Co-evaluation              End of Session Nurse Communication: Mobility status  Activity Tolerance: Patient  limited by lethargy;Patient limited by pain Patient left: in bed;with call bell/phone within reach;with family/visitor present   Time: [id number] OT Time Calculation (min): 37 min Charges:  OT General Charges $OT Visit: 1 Procedure OT Evaluation $Initial OT Evaluation Tier I: 1 Procedure OT Treatments $Therapeutic Activity: 8-22 mins G-Codes:    [name], Hernando Reali Lynn 08/21/2014, 12:02 PM  [phone]

## 2014-08-21 NOTE — Progress Notes (Addendum)
AAA Progress Note    08/21/2014 7:28 AM 1 Day Post-Op  Subjective:  Sleepy this morning, but awakes to voice-falls back asleep easily.  Denies pain.  Tm 101.9 (4am) 90's-170's systolic  (120's systolic this am) HR  90's-120's NSR 95% 2LO2NC  Gtts:  None  ABx:   Vanc Zosyn  Filed Vitals:   08/21/14 0600  BP: 121/77  Pulse: 28  Temp:   Resp: 22    Physical Exam: Cardiac:  regular Lungs:  Decreased at bases Abdomen:  Soft NT/ND; +BS Incisions:  Right upper chest incision is c/d/i with steri strips in place; left groin c/d/i with steri strips off (from surgery yesterday); right groin is c/d/i with steri strips in place Extremities:  Left AKA stump dressing is clean and dry; 2+ right palpable DP; right foot is warm. Faint palpable fem-fem graft pulse  CBC    Component Value Date/Time   WBC 9.9 08/21/2014 0226   RBC 3.37* 08/21/2014 0226   HGB 9.4* 08/21/2014 0226   HCT 28.1* 08/21/2014 0226   PLT 206 08/21/2014 0226   MCV 83.4 08/21/2014 0226   MCH 27.9 08/21/2014 0226   MCHC 33.5 08/21/2014 0226   RDW 14.6 08/21/2014 0226   LYMPHSABS 1.8 08/16/2014 1715   MONOABS 0.8 08/16/2014 1715   EOSABS 0.1 08/16/2014 1715   BASOSABS 0.1 08/16/2014 1715    BMET    Component Value Date/Time   NA 137 08/21/2014 0226   K 3.2* 08/21/2014 0226   CL 104 08/21/2014 0226   CO2 25 08/21/2014 0226   GLUCOSE 127* 08/21/2014 0226   BUN 6 08/21/2014 0226   CREATININE 1.15 08/21/2014 0226   CALCIUM 7.0* 08/21/2014 0226   GFRNONAA 74* 08/21/2014 0226   GFRAA 85* 08/21/2014 0226    INR    Component Value Date/Time   INR 1.29 08/20/2014 1625     Intake/Output Summary (Last 24 hours) at 08/21/14 0728 Last data filed at 08/21/14 0600  Gross per 24 hour  Intake 4843.75 ml  Output   2860 ml  Net 1983.75 ml    ABI's 08/20/14:  RIGHT    LEFT    PRESSURE WAVEFORM  PRESSURE WAVEFORM  BRACHIAL 120 Triphasic  BRACHIAL    DP   DP    AT 106  Monophasic  AT    PT 99 Monophasic  PT    PER   PER    GREAT TOE  NA GREAT TOE  NA    RIGHT LEFT  ABI 0.88 AKA        Urine Cx 08/19/14:  No Growth Blood Cx 08/19/14:  NGTD   Assessment/Plan:  49 y.o. male is s/p  Axillary to femoral bypass on the right with 8 mm Hemashield graft right to left femorofemoral bypass with 8 mm Hemashield graft 3 Days Post-Op And Left AKA 1 Day Post-Op  Who became septic as the left BKA that appeared to be the source.  -left amputation site is wrapped-this is clean and dry-will remove dressing tomorrow -all other incisions appear to be healing nicely -he does have a palpable right DP pulse with ABI of 0.8 -continues to be febrile this am-(he did have a period of time after surgery yesterday that he was afebrile)-continue ABx; urine Cx with no growth and blood cx are NGTD -leukocytosis improved -hypokalemia slightly improved -creatinine improved with good UOP -lethargic this am-continue to hold narcotics -acute blood loss anemia-hgb down slightly from yesterday, but overall is stable after receiving 3 units  PRBC's yesterday -dry dressing to bilateral groins daily and as needed to help wick moisture to help prevent infection.   Doreatha MassedSamantha Rhyne, PA-C [facility] [phone] 08/21/2014 7:28 AM  I have examined the patient, reviewed and agree with above.Spoke with family present.  Lethargic but wakes. Palp fem-fem pulse.  Check AKA tomorrow  Jerrion Tabbert, MD 08/21/2014 11:22 AM

## 2014-08-21 NOTE — Progress Notes (Signed)
PULMONARY / CRITICAL CARE MEDICINE   Name: Leida LauthRobert Candy MRN: [id number] DOB: 03/26/1966    ADMISSION DATE:  08/16/2014 CONSULTATION DATE:  08/19/14   REFERRING MD :  Dr Edilia Boickson    CHIEF COMPLAINT:  Ams/ decreased uop/ fever ? 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  on 2/19 underwent  Axillary to femoral bypass on the right with 8 mm Hemashield graft right to left femorofemoral bypass with 8 mm Hemashield graft with ams/ fever to 103 2/20 with low  uop so PCCM asked to evaluate for possible sepsis   STUDIES:    SIGNIFICANT EVENTS: - D/c ACEi 08/19/14  - back to OR am 2/21 for AKA for prob stump infection   SUBJECTIVE Post po confusion pain  VITAL SIGNS: Temp:  [98.3 F (36.8 C)-101.9 F (38.8 C)] 98.9 F (37.2 C) (02/22 1157) Pulse Rate:  [28-127] 108 (02/22 1100) Resp:  [15-43] 16 (02/22 1100) BP: (102-171)/(64-108) 102/64 mmHg (02/22 1100) SpO2:  [90 %-100 %] 93 % (02/22 1100) Weight:  [95.4 kg (210 lb 5.1 oz)] 95.4 kg (210 lb 5.1 oz) (02/21 1400) HEMODYNAMICS:   VENTILATOR SETTINGS:   INTAKE / OUTPUT:  Intake/Output Summary (Last 24 hours) at 08/21/14 1202 Last data filed at 08/21/14 1100  Gross per 24 hour  Intake 3313.75 ml  Output   2685 ml  Net 628.75 ml    PHYSICAL EXAMINATION: General: no distress Neuro: awakens follows commands, int confusion HEENT: jvd wnl PULM: coarse bilat CV: s1 s2 RRR GI: soft, BS wnl, no r Extremities: wound AKA left, pulses rt wnl  LABS:  CBC  Recent Labs Lab 08/20/14 0312 08/20/14 1625 08/21/14 0226  WBC 9.7 11.0* 9.9  HGB 7.1* 10.0* 9.4*  HCT 22.0* 30.8* 28.1*  PLT 225 206 206   Coag's  Recent Labs Lab 08/18/14 0600 08/20/14 0757 08/20/14 1625  INR 1.20 1.35 1.29   BMET  Recent Labs Lab 08/20/14 0757 08/20/14 1738 08/21/14 0226  NA 139 140 137  K 2.9* 3.0* 3.2*  CL 103 104 104  CO2 29 30 25   BUN 9 9 6   CREATININE 1.42* 1.15 1.15  GLUCOSE 109* 120* 127*    Electrolytes  Recent Labs Lab 08/20/14 0757 08/20/14 1738 08/21/14 0226  CALCIUM 7.3* 7.1* 7.0*   Sepsis Markers  Recent Labs Lab 08/19/14 1140  LATICACIDVEN 1.7   ABG No results for input(s): PHART, PCO2ART, PO2ART in the last 168 hours. Liver Enzymes  Recent Labs Lab 08/16/14 1742  AST 150*  ALT 68*  ALKPHOS 78  BILITOT 0.5  ALBUMIN 3.2*   Cardiac Enzymes No results for input(s): TROPONINI, PROBNP in the last 168 hours. Glucose  Recent Labs Lab 08/20/14 0744 08/20/14 1158 08/20/14 1425 08/20/14 1516 08/20/14 2233 08/21/14 0742  GLUCAP 94 142* 142* 135* 156* 114*    Imaging No results found.   ASSESSMENT / PLAN:  PULMONARY A: Hint pulm congestion on last PCXR  -low threshold for diuresis -pcxr needed for edema in am   CARDIOVASCULAR -severe hbp baseline, on ACEi and multiple longterm rx Plan:  Cardiomegaly, edema on pcxr? May need ehco assessment (normal EF 2013) pcxr again in am  Tele Low threshold lasix  RENAL A: low uop post op resolved, hypoK -foley, may be able to dc and place condom if anatomy allows -chem in am  -bnp -low threshold lasix -K supp -mag, phos in am -kvo   GASTROINTESTINAL A: Mild ileus post op improved rec minimize narcotics  Diet tolerated  INFECTIOUS A: fever to 103 post op from lower ext now amputated, at risk ATX / asp BCx2  08/19/14 >>> UC 2/20 >>>   Abx:   Zosyn 2/20 >>> Vancomycin 2/20 >>>2/22  pcxr in am  In am narrow if progressing Follow BC closely  Heme Acute blood loss anemia post op/ also partly dilutional, DVt Prevention Refuses scd Lovenox, crt in am needed  NEUROLOGIC A: acute encephalopathy/ multifactorial / pain  / narcs / delerium Rec: Add fentanyl patch 525To limit int morphine PT Frequent orientation Seroquel restart and escalate as needed Re assess LFT  To sdu Will continue to follow  Mcarthur Rossetti. Tyson Alias, MD, FACP Pgr: [phone] [place]
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
## 2014-08-21 NOTE — Evaluation (Signed)
Physical Therapy Evaluation Patient Details Name: [name] [name] MRN: [id number] DOB: 07-28-1965 Today's Date: 08/21/2014   History of Present Illness  49 yo vasculopath with L BKA 09/05/13 active smoker, 2/19 underwent Axillary to femoral bypass on the right with 8 mm Hemashield graft right to left femorofemoral bypass with ams/ fever to 103 2/20 with low urine output, septic and revision of L BKA to AKA 2/21  Clinical Impression  Pt with impaired cognition, safety, balance, function and below deficits who will benefit from acute therapy to maximize mobility, function, strength and cognition to decrease burden of care and return pt to PLOF.     Follow Up Recommendations CIR    Equipment Recommendations  None recommended by PT    Recommendations for Other Services OT consult     Precautions / Restrictions Precautions Precautions: Fall Precaution Comments: L AKA      Mobility  Bed Mobility Overal bed mobility: Needs Assistance;+2 for physical assistance Bed Mobility: Supine to Sit;Rolling Rolling: Max assist   Supine to sit: Total assist;+2 for physical assistance     General bed mobility comments: pt able to transfer into long sitting in bed with use of bil bed rails and assist at trunk to shift anteriorly. Attempted rollling and pivoting bil directions but pt resistant and not able to achieve. Pt could move RLE off EoB but unable to complete transfer further. Total assist +2 to slide pt up in bed, brakes not holding and difficult to fully slide pt. Rn made aware of bed function. Pt in chair position in bed end of session  Transfers                    Ambulation/Gait                Stairs            Wheelchair Mobility    Modified Rankin (Stroke Patients Only)       Balance Overall balance assessment: Needs assistance   Sitting balance-Leahy Scale: Poor                                       Pertinent Vitals/Pain Pain  Assessment: Faces Pain Score: 6  Pain Location: LLE- pt inconsistent throughout session with holler out and state pain in LLE then next moment have difficulty maintaining eyes open Pain Intervention(s): Premedicated before session;Repositioned    Home Living Family/patient expects to be discharged to:: Private residence Living Arrangements: Alone Available Help at Discharge: Family;Available 24 hours/day Type of Home: Apartment Home Access: Stairs to enter Entrance Stairs-Rails: None Entrance Stairs-Number of Steps: 1 Home Layout: One level Home Equipment: Walker - 2 wheels;Bedside commode;Tub bench;Wheelchair - manual Additional Comments: PLOF provided by girlfriend due to pt cognition    Prior Function Level of Independence: Needs assistance   Gait / Transfers Assistance Needed: pt can typically perform transfers and gait in home mod I with RW  ADL's / Homemaking Assistance Needed: girlfriend/caregiver helps with bathing and dressing at time and she performs the housework and cooking        Hand Dominance        Extremity/Trunk Assessment   Upper Extremity Assessment: Generalized weakness           Lower Extremity Assessment: Generalized weakness         Communication   Communication: No difficulties  Cognition Arousal/Alertness: Lethargic Behavior During Therapy:  Flat affect;Impulsive;Agitated Overall Cognitive Status: Impaired/Different from baseline Area of Impairment: Orientation;Attention;Memory;Following commands;Safety/judgement;Awareness;Problem solving Orientation Level: Disoriented to;Place;Time;Situation Current Attention Level: Focused Memory: Decreased recall of precautions;Decreased short-term memory Following Commands: Follows one step commands inconsistently Safety/Judgement: Decreased awareness of deficits   Problem Solving: Slow processing;Decreased initiation;Difficulty sequencing;Requires verbal cues;Requires tactile cues General  Comments: pt very confused, difficulty processing all commands or questions    General Comments      Exercises        Assessment/Plan    PT Assessment Patient needs continued PT services  PT Diagnosis Generalized weakness;Difficulty walking;Altered mental status   PT Problem List Decreased strength;Decreased activity tolerance;Decreased balance;Decreased mobility;Decreased coordination;Pain;Decreased safety awareness;Decreased knowledge of use of DME  PT Treatment Interventions Gait training;DME instruction;Functional mobility training;Therapeutic activities;Therapeutic exercise;Balance training;Patient/family education   PT Goals (Current goals can be found in the Care Plan section) Acute Rehab PT Goals Patient Stated Goal: return home PT Goal Formulation: Patient unable to participate in goal setting Time For Goal Achievement: 09/04/14 Potential to Achieve Goals: Fair    Frequency Min 3X/week   Barriers to discharge Decreased caregiver support      Co-evaluation               End of Session   Activity Tolerance: Patient limited by lethargy Patient left: in bed;with call bell/phone within reach;with bed alarm set Nurse Communication: Mobility status;Precautions         Time: [id number] PT Time Calculation (min) (ACUTE ONLY): 19 min   Charges:   PT Evaluation $Initial PT Evaluation Tier I: 1 Procedure     PT G CodesDelorse [name]:        [name] [name] Beth 08/21/2014, 11:03 AM tympanic membrane
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
## 2014-08-21 NOTE — Progress Notes (Signed)
Rehab Admissions Coordinator Note:  Patient was screened by Clois DupesBoyette, Ikenna Ohms Godwin for appropriateness for an Inpatient Acute Rehab Consult per PT recommendation.  At this time, we are recommending Inpatient Rehab consult. Please place order. Pt previously admitted 08/2013.   Clois DupesBoyette, Rashema Seawright Godwin 08/21/2014, 11:43 AM  I can be reached at [phone].

## 2014-08-22 ENCOUNTER — Inpatient Hospital Stay (HOSPITAL_COMMUNITY): Payer: Medicaid Other

## 2014-08-22 DIAGNOSIS — Z9889 Other specified postprocedural states: Secondary | ICD-10-CM

## 2014-08-22 DIAGNOSIS — Z89612 Acquired absence of left leg above knee: Secondary | ICD-10-CM | POA: Insufficient documentation

## 2014-08-22 DIAGNOSIS — O10019 Pre-existing essential hypertension complicating pregnancy, unspecified trimester: Secondary | ICD-10-CM | POA: Insufficient documentation

## 2014-08-22 DIAGNOSIS — F1010 Alcohol abuse, uncomplicated: Secondary | ICD-10-CM

## 2014-08-22 DIAGNOSIS — Z720 Tobacco use: Secondary | ICD-10-CM | POA: Insufficient documentation

## 2014-08-22 DIAGNOSIS — R410 Disorientation, unspecified: Secondary | ICD-10-CM

## 2014-08-22 DIAGNOSIS — F411 Generalized anxiety disorder: Secondary | ICD-10-CM

## 2014-08-22 DIAGNOSIS — Z95828 Presence of other vascular implants and grafts: Secondary | ICD-10-CM | POA: Insufficient documentation

## 2014-08-22 LAB — CBC
HCT: 27.9 % — ABNORMAL LOW (ref 39.0–52.0)
Hemoglobin: 9 g/dL — ABNORMAL LOW (ref 13.0–17.0)
MCH: 28 pg (ref 26.0–34.0)
MCHC: 32.3 g/dL (ref 30.0–36.0)
MCV: 86.6 fL (ref 78.0–100.0)
PLATELETS: 241 10*3/uL (ref 150–400)
RBC: 3.22 MIL/uL — ABNORMAL LOW (ref 4.22–5.81)
RDW: 14.7 % (ref 11.5–15.5)
WBC: 9.1 10*3/uL (ref 4.0–10.5)

## 2014-08-22 LAB — GLUCOSE, CAPILLARY
GLUCOSE-CAPILLARY: 111 mg/dL — AB (ref 70–99)
GLUCOSE-CAPILLARY: 142 mg/dL — AB (ref 70–99)
Glucose-Capillary: 107 mg/dL — ABNORMAL HIGH (ref 70–99)
Glucose-Capillary: 105 mg/dL — ABNORMAL HIGH (ref 70–99)
Glucose-Capillary: 122 mg/dL — ABNORMAL HIGH (ref 70–99)

## 2014-08-22 LAB — COMPREHENSIVE METABOLIC PANEL
ALT: 70 U/L — ABNORMAL HIGH (ref 0–53)
AST: 130 U/L — AB (ref 0–37)
Albumin: 1.7 g/dL — ABNORMAL LOW (ref 3.5–5.2)
Alkaline Phosphatase: 88 U/L (ref 39–117)
Anion gap: 6 (ref 5–15)
BUN: 6 mg/dL (ref 6–23)
CALCIUM: 7.5 mg/dL — AB (ref 8.4–10.5)
CO2: 30 mmol/L (ref 19–32)
Chloride: 103 mmol/L (ref 96–112)
Creatinine, Ser: 1.19 mg/dL (ref 0.50–1.35)
GFR calc Af Amer: 82 mL/min — ABNORMAL LOW (ref >90)
GFR, EST NON AFRICAN AMERICAN: 71 mL/min — AB (ref >90)
GLUCOSE: 97 mg/dL (ref 70–99)
Potassium: 3.6 mmol/L (ref 3.5–5.1)
Sodium: 139 mmol/L (ref 135–145)
Total Bilirubin: 0.7 mg/dL (ref 0.3–1.2)
Total Protein: 5.5 g/dL — ABNORMAL LOW (ref 6.0–8.3)

## 2014-08-22 LAB — BRAIN NATRIURETIC PEPTIDE: B Natriuretic Peptide: 132 pg/mL — ABNORMAL HIGH (ref 0.0–100.0)

## 2014-08-22 LAB — PREPARE RBC (CROSSMATCH)

## 2014-08-22 LAB — PHOSPHORUS: Phosphorus: 3 mg/dL (ref 2.3–4.6)

## 2014-08-22 LAB — MAGNESIUM: Magnesium: 1.7 mg/dL (ref 1.5–2.5)

## 2014-08-22 MED ORDER — ADULT MULTIVITAMIN W/MINERALS CH
1.0000 | ORAL_TABLET | Freq: Every day | ORAL | Status: DC
  Administered 2014-08-22 – 2014-08-24 (×3): 1 via ORAL
  Filled 2014-08-22 (×3): qty 1

## 2014-08-22 MED ORDER — LORAZEPAM 0.5 MG PO TABS: 1.0000 mg | ORAL_TABLET | Freq: Four times a day (QID) | ORAL | Status: DC | PRN

## 2014-08-22 MED ORDER — FOLIC ACID 1 MG PO TABS
1.0000 mg | ORAL_TABLET | Freq: Every day | ORAL | Status: DC
  Administered 2014-08-22 – 2014-08-24 (×3): 1 mg via ORAL
  Filled 2014-08-22 (×3): qty 1

## 2014-08-22 MED ORDER — SODIUM CHLORIDE 0.9 % IV SOLN
INTRAVENOUS | Status: DC
  Administered 2014-08-23 (×2): via INTRAVENOUS

## 2014-08-22 MED ORDER — SODIUM CHLORIDE 0.9 % IV SOLN
Freq: Once | INTRAVENOUS | Status: AC
  Administered 2014-08-23: via INTRAVENOUS

## 2014-08-22 MED ORDER — LORAZEPAM 2 MG/ML IJ SOLN: 1.0000 mg | Freq: Four times a day (QID) | INTRAMUSCULAR | Status: DC | PRN

## 2014-08-22 MED ORDER — OXYCODONE HCL 5 MG PO TABS
15.0000 mg | ORAL_TABLET | ORAL | Status: DC | PRN
  Administered 2014-08-22 – 2014-08-24 (×9): 15 mg via ORAL
  Filled 2014-08-22 (×9): qty 3

## 2014-08-22 MED ORDER — VITAMIN B-1 100 MG PO TABS
100.0000 mg | ORAL_TABLET | Freq: Every day | ORAL | Status: DC
  Administered 2014-08-22 – 2014-08-24 (×3): 100 mg via ORAL
  Filled 2014-08-22 (×3): qty 1

## 2014-08-22 MED ORDER — THIAMINE HCL 100 MG/ML IJ SOLN
100.0000 mg | Freq: Every day | INTRAMUSCULAR | Status: DC
  Filled 2014-08-22 (×2): qty 1

## 2014-08-22 MED ORDER — THIAMINE HCL 100 MG/ML IJ SOLN
Freq: Once | INTRAVENOUS | Status: AC
  Administered 2014-08-22: 21:00:00 via INTRAVENOUS
  Filled 2014-08-22: qty 1000

## 2014-08-22 MED ORDER — NICOTINE 21 MG/24HR TD PT24
21.0000 mg | MEDICATED_PATCH | Freq: Every day | TRANSDERMAL | Status: DC
  Administered 2014-08-22 – 2014-08-24 (×3): 21 mg via TRANSDERMAL
  Filled 2014-08-22 (×4): qty 1

## 2014-08-22 MED ORDER — DIPHENHYDRAMINE HCL 50 MG/ML IJ SOLN
25.0000 mg | Freq: Once | INTRAMUSCULAR | Status: AC
  Administered 2014-08-22: 25 mg via INTRAVENOUS
  Filled 2014-08-22: qty 1

## 2014-08-22 NOTE — Progress Notes (Addendum)
Progress Note    08/22/2014 7:43 AM 2 Days Post-Op  Subjective:  Sleeping-awakes to voice.  States no pain currently  Tm 100.9 now 99.7 HR 100's-110's regular 100's--150's systolic (120's this am) 100% RA  Filed Vitals:   08/22/14 0400  BP: 126/83  Pulse: 107  Temp: 99.7 F (37.6 C)  Resp: 19    Physical Exam: Cardiac:  regular Lungs:  Decreased BS at bases, otherwise clear to auscultation Incisions:  Right upper chest incision is c/d/i with steri strips in place; left groin c/d/i with steri strips off (from surgery yesterday); right groin is c/d/i with steri strips in place; dressing removed from stump-clean and dry with staples in tact.  The stump is somewhat edematous. Extremities:  2+ palpable right DP; right foot is warm; right great toe with some discoloration; faint palpable fem-fem graft pulse   CBC    Component Value Date/Time   WBC 9.1 08/22/2014 0349   RBC 3.22* 08/22/2014 0349   HGB 9.0* 08/22/2014 0349   HCT 27.9* 08/22/2014 0349   PLT 241 08/22/2014 0349   MCV 86.6 08/22/2014 0349   MCH 28.0 08/22/2014 0349   MCHC 32.3 08/22/2014 0349   RDW 14.7 08/22/2014 0349   LYMPHSABS 1.8 08/16/2014 1715   MONOABS 0.8 08/16/2014 1715   EOSABS 0.1 08/16/2014 1715   BASOSABS 0.1 08/16/2014 1715    BMET    Component Value Date/Time   NA 139 08/22/2014 0349   K 3.6 08/22/2014 0349   CL 103 08/22/2014 0349   CO2 30 08/22/2014 0349   GLUCOSE 97 08/22/2014 0349   BUN 6 08/22/2014 0349   CREATININE 1.19 08/22/2014 0349   CALCIUM 7.5* 08/22/2014 0349   GFRNONAA 71* 08/22/2014 0349   GFRAA 82* 08/22/2014 0349    INR    Component Value Date/Time   INR 1.29 08/20/2014 1625     Intake/Output Summary (Last 24 hours) at 08/22/14 0743 Last data filed at 08/22/14 0600  Gross per 24 hour  Intake    890 ml  Output   1905 ml  Net  -1015 ml     Assessment:  49 y.o. male is s/p:  Axillary to femoral bypass on the right with 8 mm Hemashield graft right to  left femorofemoral bypass with 8 mm Hemashield graft 4Days Post-Op And Left AKA 2 Days Post-Op  Plan: -left AKA stump viable at this time-continue to monitor closely.  Will order retention sock for stump. -all other incisions continue to heal nicely-continue dry dressings to bilateral groins daily and prn to wick moisture -fevers are improving-continue ABx -blood cx NGTD -creatinine stable -acute blood loss anemia down slightly-pt tolerating-continue to monitor -hypokalemia continues to improve -DVT prophylaxis:  Lovenox -pt's girlfriend states that when he had his previous amputation, in the beginning, he was requiring  OxyIR.  He is taking  now-will increase to  to see if he can get better pain control.   Doreatha Massed, PA-C [facility] [phone] 08/22/2014 7:43 AM    I have examined the patient, reviewed and agree with above. Patent axillofemoral and femorofemoral bypass. Left AKA looks very good this morning. Should that remain sedated from pain meds and also Benadryl he received for itching. Patient's girlfriend is present and very attentive. She states that he is responding normally to her when he awakens. Again explained that the stump looks outstanding early on but could potentially have issues with ischemic muscle that would present itself over several days. Also has some  discoloration over the tip of his right great toe which I suspect is related to severe ischemia prior to his bypass.  EARLY, TODD, MD 08/22/2014 8:26 AM
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
## 2014-08-22 NOTE — Progress Notes (Signed)
Rehab admissions - I will be following pt's case and watch how pt progresses with therapies. Per rehab MD, "Home Health vs CIR depending upon how much the right lower leg ischemia/PAD affects his activity tolerance."  I will check on pt's status tomorrow.  Thanks.  Juliann MuleJanine Nadea Kirkland, PT Rehabilitation Admissions Coordinator [phone]

## 2014-08-22 NOTE — Progress Notes (Signed)
eLink Physician-Brief Progress Note Patient Name: [name] LauthRobert [name] DOB: 1965/07/04 MRN: [id number]   Date of Service  08/22/2014  HPI/Events of Note  Patient c/o of itching all over body and stump area  eICU Interventions  Plan: One time dose of benadryl 25 mg IV for itching Additional doses/meds per primary team     Intervention Category Minor Interventions: Routine modifications to care plan (e.g. PRN medications for pain, fever)  [name] [name] 08/22/2014, 5:31 AM

## 2014-08-22 NOTE — Progress Notes (Signed)
Orthopedic Tech Progress Note Patient Details:  [name] LauthRobert [name] [name] [id number] [employer] called for brace order Patient ID: [name] [name], male   DOB: [name], 49 y.o.   MRN: [id number]   [name] [name] 08/22/2014, 11:29 AM

## 2014-08-23 LAB — GLUCOSE, CAPILLARY
GLUCOSE-CAPILLARY: 175 mg/dL — AB (ref 70–99)
GLUCOSE-CAPILLARY: 203 mg/dL — AB (ref 70–99)
Glucose-Capillary: 93 mg/dL (ref 70–99)

## 2014-08-23 MED ORDER — FUROSEMIDE 10 MG/ML IJ SOLN
20.0000 mg | Freq: Two times a day (BID) | INTRAMUSCULAR | Status: DC
  Administered 2014-08-23: 20 mg via INTRAVENOUS
  Filled 2014-08-23: qty 2

## 2014-08-23 MED ORDER — ENSURE PUDDING PO PUDG
1.0000 | Freq: Three times a day (TID) | ORAL | Status: DC
  Administered 2014-08-23 – 2014-08-24 (×5): 1 via ORAL

## 2014-08-23 MED ORDER — POTASSIUM CHLORIDE CRYS ER 20 MEQ PO TBCR
20.0000 meq | EXTENDED_RELEASE_TABLET | Freq: Two times a day (BID) | ORAL | Status: AC
  Administered 2014-08-23 (×2): 20 meq via ORAL
  Filled 2014-08-23 (×2): qty 1

## 2014-08-23 MED ORDER — DIPHENHYDRAMINE HCL 25 MG PO CAPS
25.0000 mg | ORAL_CAPSULE | Freq: Four times a day (QID) | ORAL | Status: DC | PRN
Start: 2014-08-23 — End: 2014-08-24
  Administered 2014-08-23 – 2014-08-24 (×2): 25 mg via ORAL
  Filled 2014-08-23 (×2): qty 1

## 2014-08-23 MED ORDER — SODIUM CHLORIDE 0.9 % IV SOLN
INTRAVENOUS | Status: DC
  Administered 2014-08-23: 18:00:00 via INTRAVENOUS

## 2014-08-23 NOTE — Progress Notes (Signed)
Rehab admissions - I have been following pt's case and made note of both PT/OT's recommendation for inpatient rehab. I met with pt this afternoon in follow up to rehab MD consult. Pt is known to me as he was in [place] in March 2013.   Pt had confusion throughout my discussion with him and stated, "I didn't know they were going to do this surgery...".  I explained that we are following his progress and are considering another possible inpatient rehab stay when he is medically appropriate. Pt did not seem to follow my conversation.  I then called and spoke with pt's mother. Mother was in support of CIR. I then spoke with pt's friend/significant other Lynette. She is in full support of pt coming to CIR as well and can offer needed assistance for pt like she did before.  I will follow pt's case and review his status with Dr. Naaman Plummer, rehab MD. We will consider possible inpatient rehab admit pending approval per Dr. Naaman Plummer and pt's medical clearance.  I updated Henrietta about pt's case.  Please call me with any questions. Thanks.  Nanetta Batty, PT Rehabilitation Admissions Coordinator [phone]

## 2014-08-23 NOTE — Progress Notes (Addendum)
  Progress Note    08/23/2014 7:41 AM 3 Days Post-Op  Subjective:  More alert this morning-states he is not having much pain.  Tm 99.6 now afebrile HR 80's-100's NSR  97% RA    Filed Vitals:   08/23/14 0730  BP: 155/91  Pulse:   Temp: 98.6 F (37 C)  Resp: 15    Physical Exam: Cardiac:  regular Lungs:  Non labored Incisions:  Right chest incision is c/d/i with steri strips in place; bilateral groins are clean and dry with gauze over both incisions.  Left AKA stump c/d/i with staples in tact. Extremities:  2+ right DP pulse; right foot is warm. Pitting edema RLE; left stump is edematous   CBC    Component Value Date/Time   WBC 9.1 08/22/2014 0349   RBC 3.22* 08/22/2014 0349   HGB 9.0* 08/22/2014 0349   HCT 27.9* 08/22/2014 0349   PLT 241 08/22/2014 0349   MCV 86.6 08/22/2014 0349   MCH 28.0 08/22/2014 0349   MCHC 32.3 08/22/2014 0349   RDW 14.7 08/22/2014 0349   LYMPHSABS 1.8 08/16/2014 1715   MONOABS 0.8 08/16/2014 1715   EOSABS 0.1 08/16/2014 1715   BASOSABS 0.1 08/16/2014 1715    BMET    Component Value Date/Time   NA 139 08/22/2014 0349   K 3.6 08/22/2014 0349   CL 103 08/22/2014 0349   CO2 30 08/22/2014 0349   GLUCOSE 97 08/22/2014 0349   BUN 6 08/22/2014 0349   CREATININE 1.19 08/22/2014 0349   CALCIUM 7.5* 08/22/2014 0349   GFRNONAA 71* 08/22/2014 0349   GFRAA 82* 08/22/2014 0349    INR    Component Value Date/Time   INR 1.29 08/20/2014 1625   No new labs today  Intake/Output Summary (Last 24 hours) at 08/23/14 0741 Last data filed at 08/23/14 0730  Gross per 24 hour  Intake   1520 ml  Output   1175 ml  Net    345 ml     Assessment:  49 y.o. male is s/p:  Axillary to femoral bypass on the right with 8 mm Hemashield graft right to left femorofemoral bypass with 8 mm Hemashield graft 5 Days Post-Op And Left AKA  3 Days Post-Op   Plan: -pt's fevers improving and all incisions are c/d/i-would continue ABx for now -vital signs  improved -left stump continues to look ok but edematous.  Continue to monitor closely -blood cultures continue to be negative; urine cx negative -DVT prophylaxis:  Lovenox -acute blood loss anemia-received 2 units PRBC's yesterday for total of 5 units this admission -pt receiving banana bag per TR[facility]-if pt is eating,  heplock IV and will give dose of gentle lasix bid x 2 doses; supplement K+ also -will add ensure for nutrition; continue to encourage nutrition to promote healing -check labs tomorrow -d/c foley & have urinal at bedside. -PT/OT consult.  Hopefully will be a candidate for inpatient rehab.  Pt's girlfriend stated that his house is difficult to navigate with walker and other supplies that may be needed.   Doreatha MassedSamantha Rhyne, PA-C [facility] [phone] 08/23/2014 7:41 AM   I have examined the patient, reviewed and agree with above. Up in chair. Alert and oriented this morning. Palpable axillofemoral femorofemoral graft pulse. Appreciate [facility]'s assistance.  Siona Coulston, MD 08/23/2014 11:59 AM

## 2014-08-23 NOTE — Progress Notes (Signed)
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 Patient able to tolerated extended time at EOB as well as OOB to chair transfer with assist. At this time feel patient remains appropriate for CIR upon post acute discharge. Will continue to see and progress as tolerated.  Follow Up Recommendations  CIR     Equipment Recommendations  None recommended by PT    Recommendations for Other Services Rehab consult     Precautions / Restrictions Precautions Precautions: Fall Precaution Comments: L AKA Restrictions Weight Bearing Restrictions: No    Mobility  Bed Mobility Overal bed mobility: Needs Assistance Bed Mobility: Supine to Sit     Supine to sit: Min assist;HOB elevated     General bed mobility comments: heavy use of rails, assist with pad to advance hips, increased time  Transfers Overall transfer level: Needs assistance Equipment used: Rolling walker (2 wheeled) Transfers: Sit to/from [employer]Stand;Stand Pivot Transfers Sit to Stand: +2 physical assistance;Max assist;From elevated surface (with use of pad to raise hips) Stand pivot transfers: +2 physical assistance;Min assist       General transfer comment: increased time and effort, verbal and physical cues for technique  Ambulation/Gait                 Stairs            Wheelchair Mobility    Modified Rankin (Stroke Patients Only)       Balance Overall balance assessment: Needs assistance   Sitting  balance-Leahy Scale: Fair       Standing balance-Leahy Scale: Poor                      Cognition Arousal/Alertness: Awake/alert Behavior During Therapy: Anxious Overall Cognitive Status: Impaired/Different from baseline     Current Attention Level: Sustained Memory: Decreased short-term memory Following Commands: Follows one step commands with increased time (multimodal cues) Safety/Judgement: Decreased awareness of safety;Decreased awareness of deficits   Problem Solving: Slow processing;Difficulty sequencing;Requires verbal cues;Requires tactile cues General Comments: much improved level of arousal and ability to participate    Exercises      General Comments General comments (skin integrity, edema, etc.): educated patient regarding mobility of residual limb, positioning, and pain management techniques.       Pertinent Vitals/Pain Pain Assessment: 0-10 Pain Score: 6  Pain Location: L LE Pain Descriptors / Indicators: Aching Pain Intervention(s): Monitored during session;Repositioned;Patient requesting pain meds-RN notified    Home Living                      Prior Function            PT Goals (current goals can now be found in the care plan section) Acute Rehab PT Goals Patient Stated Goal: return home PT Goal Formulation: Patient unable to participate in goal setting Time For Goal Achievement: 09/04/14 Potential to Achieve Goals: Fair Progress towards PT goals: Progressing toward goals    Frequency  Min 3X/week    PT  Plan Current plan remains appropriate    Co-evaluation PT/OT/SLP Co-Evaluation/Treatment: Yes Reason for Co-Treatment: For patient/therapist safety PT goals addressed during session: Mobility/safety with mobility;Balance OT goals addressed during session: ADL's and self-care     End of Session Equipment Utilized During Treatment: Gait belt Activity Tolerance: Patient tolerated treatment well;Patient limited by  pain Patient left: in chair;with call bell/phone within reach     Time: 0830-0908 PT Time Calculation (min) (ACUTE ONLY): 38 min  Charges:  $Therapeutic Activity: 8-22 mins                    G CodesFabio Asa 09-03-2014, 9:47 AM Charlotte Crumb, PT DPT  [phone]

## 2014-08-23 NOTE — Progress Notes (Signed)
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  Good effort with all mobility, but remains generally weak requiring +2 assist.  Pt now able to self feed and perform grooming with set up to min assist. Continues to be a good rehab candidate.  Follow Up Recommendations  CIR    Equipment Recommendations  None recommended by OT    Recommendations for Other Services      Precautions / Restrictions Precautions Precautions: Fall Precaution Comments: L AKA Restrictions Weight Bearing Restrictions: No       Mobility Bed Mobility Overal bed mobility: Needs Assistance Bed Mobility: Supine to Sit     Supine to sit: Min assist;HOB elevated     General bed mobility comments: heavy use of rails, assist with pad to advance hips, increased time  Transfers Overall transfer level: Needs assistance Equipment used: Rolling walker (2 wheeled) Transfers: Sit to/from [employer];Stand Pivot Transfers Sit to Stand: +2 physical assistance;Max assist;From elevated surface (with use of pad to raise hips) Stand pivot transfers: +2 physical assistance;Min assist       General transfer comment: increased time and effort, verbal and physical cues for technique    Balance Overall balance assessment: Needs assistance   Sitting balance-Leahy Scale: Fair       Standing balance-Leahy Scale: Poor                     ADL Overall ADL's : Needs assistance/impaired Eating/Feeding: Set up;Bed level Eating/Feeding Details (indicate cue type and reason): instructed  girlfriend to allow pt to self feed Grooming: Wash/dry hands;Wash/dry face;Minimal assistance;Sitting               Lower Body Dressing: Total assistance;Bed level Lower Body Dressing Details (indicate cue type and reason): R sock Toilet Transfer: +2 for physical assistance;Minimal assistance;BSC;RW (simulated)   Toileting- Clothing Manipulation and Hygiene: Total assistance;+2 for physical assistance;Sit to/from stand Toileting - Clothing Manipulation Details (indicate cue type and reason): pt unable to release walker in standing for ADL              Vision                     Perception     Praxis      Cognition   Behavior During Therapy: Anxious Overall Cognitive Status: Impaired/Different from baseline     Current Attention Level: Sustained Memory: Decreased short-term memory  Following Commands: Follows one step commands with increased time (multimodal cues) Safety/Judgement: Decreased awareness of safety;Decreased awareness of deficits   Problem Solving: Slow processing;Difficulty sequencing;Requires verbal cues;Requires tactile cues General Comments: much improved level of arousal and ability to participate    Extremity/Trunk Assessment               Exercises     Shoulder Instructions       General Comments      Pertinent Vitals/ Pain       Pain Assessment: 0-10 Pain Score: 6  Pain Location: L LE Pain Descriptors / Indicators: Aching  Pain Intervention(s): Monitored during session;Repositioned;Patient requesting pain meds-RN notified  Home Living                                          Prior Functioning/Environment              Frequency Min 2X/week     Progress Toward Goals  OT Goals(current goals can now be found in the care plan section)  Progress towards OT goals: Progressing toward goals  Acute Rehab OT Goals Patient Stated Goal: return home  Plan Discharge plan remains appropriate     Co-evaluation    PT/OT/SLP Co-Evaluation/Treatment: Yes Reason for Co-Treatment: For patient/therapist safety   OT goals addressed during session: ADL's and self-care      End of Session Equipment Utilized During Treatment: Gait belt;Rolling walker   Activity Tolerance Patient tolerated treatment well   Patient Left in chair;with call bell/phone within reach;with nursing/sitter in room   Nurse Communication Mobility status        Time: 1610-9604 OT Time Calculation (min): 38 min  Charges: OT General Charges $OT Visit: 1 Procedure OT Treatments $Self Care/Home Management : 8-22 mins $Therapeutic Activity: 8-22 mins  Evern Bio 08/23/2014, 9:38 AM [phone]

## 2014-08-23 NOTE — Progress Notes (Signed)
On assessment, patient had a discoloring on tip on right great toe.  Notified K. Schorr of this finding.  Pulse remains intact by doppler on that extremity as well as on the left.  Capillary refill is less than 2 seconds, skin is warm and dry.  Will monitor

## 2014-08-23 NOTE — Clinical Social Work Note (Signed)
CSW spoke to patient concerning PT recommendation for CIR- patient states that he does not remember inpatient rehab being discussed and became agitated when CSW was discussing SNF.  Patient answered a phone call and requested that CSW speak with Lanette who was on the phone- Lanette states that if he does not get admitted to Inpatient rehab then he will go home- Lanette states she is the patients neighbor/ home health aid and she can take care of him at home.  CSW signing off.  Merlyn LotJenna Holoman, LCSWA Clinical Social Worker [phone]

## 2014-08-23 NOTE — Consult Note (Signed)
[facility] TEAM 1 - Stepdown/ICU TEAM Consult F/U Note  Lomax Poehler ZOX:[id number] DOB: 1966-06-30 DOA: 08/16/2014 PCP: Pcp Not In System  Admit HPI / Brief Narrative: 49 yo M Hx Anxiety, alcohol abuse, tobacco abuse, HTN, stroke with residual slurred speech, PVD, diabetes type 2, and HLD who presented to the emergency room with an ischemic left below-knee amputation stump. His past history is significant for stroke from severe carotid disease and subsequent endarterectomy by Dr. Darrick Penna in February 2013. He had bilateral lower extremity arterial insufficiency and underwent aortobifemoral bypass by Dr. Darrick Penna in July 2013.  He subsequently presented with severe ischemia of his left leg and underwent CT angiogram in March 2015. This revealed occlusion of the right and left limb of his aortobifemoral bypass. On 08/31/2013 he underwent thrombectomies of the right and left limb of his aortofemoral bypass, subsequently underwent left to right femoro-femoral bypass, and several days later underwent left below-knee amputation. He eventually healed the below-knee amputation. Over the past several months he has had increasing pain in his left below-knee amputation stump. He initially had no skin breakdown but over the last several weeks had skin breakdown over the tibia. He presented to the emergency room reporting worsening pain. He felt that his right leg was somewhat cold but did not note any acute change in motor or sensory function.   On 2/19 he underwentaxillary to femoral bypass on the right w/ right to left femoro-femoral bypass.  His course has been complicated by ams and fever to 103.  HPI/Subjective: The pt is awake and interactive, but slightly confused.  He denies cp, n/v, or abdom pain.  He currently is c/o pain "in my left leg."  Assessment/Plan:  Profound vascular disease s/p Axillary to femoral bypass on the right and right to left femorofemoral bypass  -Per Vasc Surgery  Acute toxic  metabolic encephalopathy  -Most likely multifactorial to include multiple surgeries, pain medication, anemia, EtOH abuse - spouse at bedside reports that he is slowly improving - check B12, folic acid, and RPR  Alcohol abuse -on CIWA protocol   Acute blood loss anemia postop -s/p total of 5U PRBC thus far - recheck Hgb in AM   Tobacco abuse -Nicoderm patch   HTN - severe uncontrolled  -BP currently well controlled   DM -A1c 10.5 - CBG currently reasonably controlled - follow trend   Code Status: FULL Family Communication: spoke w/ spouse at bedside   Antibiotics: Zosyn 2/20 >  DVT prophylaxis: lovenox   Objective: Blood pressure 129/81, pulse 101, temperature 99 F (37.2 C), temperature source Oral, resp. rate 14, height  (1.88 m), weight 100.3 kg (221 lb 1.9 oz), SpO2 92 %.  Intake/Output Summary (Last 24 hours) at 08/23/14 1716 Last data filed at 08/23/14 1600  Gross per 24 hour  Intake   1810 ml  Output   3150 ml  Net  -1340 ml   Exam: General: No acute respiratory distress Lungs: Clear to auscultation bilaterally without wheezes or crackles Cardiovascular: Regular rate and rhythm without murmur gallop or rub normal S1 and S2 Abdomen: Nontender, nondistended, soft, bowel sounds positive, no rebound, no ascites, no appreciable mass Extremities: 1+ edema R LE - no cyanosis   Data Reviewed: Basic Metabolic Panel:  Recent Labs Lab 08/19/14 0710 08/20/14 0757 08/20/14 1738 08/21/14 0226 08/22/14 0349  NA 135 139 140 137 139  K 3.1* 2.9* 3.0* 3.2* 3.6  CL 94* 103 104 104 103  CO2 33* 30  GLUCOSE 170* 109* 120* 127* 97  BUN 9 9 9 6 6   CREATININE 1.29 1.42* 1.15 1.15 1.19  CALCIUM 8.1* 7.3* 7.1* 7.0* 7.5*  MG  --   --   --   --  1.7  PHOS  --   --   --   --  3.0    Liver Function Tests:  Recent Labs Lab 08/16/14 1742 08/22/14 0349  AST 150* 130*  ALT 68* 70*  ALKPHOS 78 88  BILITOT 0.5 0.7  PROT 7.3 5.5*  ALBUMIN 3.2* 1.7*     Coags:  Recent Labs Lab 08/16/14 1742 08/18/14 0600 08/20/14 0757 08/20/14 1625  INR 1.18 1.20 1.35 1.29    CBC:  Recent Labs Lab 08/19/14 0710 08/20/14 0312 08/20/14 1625 08/21/14 0226 08/22/14 0349  WBC 11.4* 9.7 11.0* 9.9 9.1  HGB 8.9* 7.1* 10.0* 9.4* 9.0*  HCT 28.0* 22.0* 30.8* 28.1* 27.9*  MCV 85.4 84.9 84.2 83.4 86.6  PLT 266 225 206 206 241    CBG:  Recent Labs Lab 08/22/14 1635 08/22/14 2155 08/23/14 0748 08/23/14 1139 08/23/14 1548  GLUCAP 105* 122* 93 175* 203*    Recent Results (from the past 240 hour(s))  MRSA PCR Screening     Status: None   Collection Time: 08/16/14  8:17 PM  Result Value Ref Range Status   MRSA by PCR NEGATIVE NEGATIVE Final    Comment:        The GeneXpert MRSA Assay (FDA approved for NASAL specimens only), is one component of a comprehensive MRSA colonization surveillance program. It is not intended to diagnose MRSA infection nor to guide or monitor treatment for MRSA infections.   Culture, blood (routine x 2)     Status: None (Preliminary result)   Collection Time: 08/19/14  9:20 AM  Result Value Ref Range Status   Specimen Description BLOOD RIGHT HAND  Final   Special Requests BOTTLES DRAWN AEROBIC ONLY 3CC  Final   Culture   Final           BLOOD CULTURE RECEIVED NO GROWTH TO DATE CULTURE WILL BE HELD FOR 5 DAYS BEFORE ISSUING A FINAL NEGATIVE REPORT [facility]formed at [facility]    Report Status PENDING  Incomplete  Culture, blood (routine x 2)     Status: None (Preliminary result)   Collection Time: 08/19/14  9:25 AM  Result Value Ref Range Status   Specimen Description BLOOD RIGHT ARM  Final   Special Requests BOTTLES DRAWN AEROBIC ONLY 10CC  Final   Culture   Final           BLOOD CULTURE RECEIVED NO GROWTH TO DATE CULTURE WILL BE HELD FOR 5 DAYS BEFORE ISSUING A FINAL NEGATIVE REPORT Note: Culture results may be compromised due to an excessive volume of blood received in culture  bottles. Performed at [facility]    Report Status PENDING  Incomplete  Urine culture     Status: None   Collection Time: 08/19/14  9:50 AM  Result Value Ref Range Status   Specimen Description URINE, CATHETERIZED  Final   Special Requests NONE  Final   Colony Count NO GROWTH Performed at [facility]   Final   Culture NO GROWTH Performed at [facility]   Final   Report Status 08/20/2014 FINAL  Final     Studies:  Recent x-ray studies have been reviewed in detail by the Attending Physician  Scheduled Meds:  Scheduled Meds: . amphetamine-dextroamphetamine  15  mg Oral Daily  . aspirin  325 mg Oral Daily  . atorvastatin  40 mg Oral q morning - 10a  . buPROPion  100 mg Oral TID  . docusate sodium  100 mg Oral Daily  . enoxaparin (LOVENOX) injection  40 mg Subcutaneous Q24H  . feeding supplement (ENSURE)  1 Container Oral TID WC  . fentaNYL  25 mcg Transdermal Q72H  . folic acid  1 mg Oral Daily  . furosemide  20 mg Intravenous BID  . gabapentin  300 mg Oral TID  . insulin aspart  0-15 Units Subcutaneous TID WC  . insulin glargine  40 Units Subcutaneous QHS  . multivitamin with minerals  1 tablet Oral Daily  . nicotine  21 mg Transdermal Daily  . pantoprazole  40 mg Oral Daily  . piperacillin-tazobactam (ZOSYN)  IV  3.375 g Intravenous 3 times per day  . potassium chloride  20 mEq Oral BID  . QUEtiapine  50 mg Oral TID  . sodium chloride  3 mL Intravenous Q12H  . thiamine  100 mg Oral Daily    Time spent on care of this patient: 35 mins   Elleanor Guyett T , MD   [facility] Office  [phone] Pager - Text Page per Loretha Stapler as per below:  On-Call/Text Page:      [email or web address]      password [email or web address]  If 7PM-7AM, please contact night-coverage [email or web address] Password [email or web address] 08/23/2014, 5:16 PM   LOS: 7 days

## 2014-08-24 ENCOUNTER — Encounter (HOSPITAL_COMMUNITY): Payer: Self-pay

## 2014-08-24 ENCOUNTER — Inpatient Hospital Stay (HOSPITAL_COMMUNITY)
Admission: RE | Admit: 2014-08-24 | Discharge: 2014-08-31 | DRG: 300 | Disposition: A | Discharge status: Home Health | Payer: Medicaid Other | Source: Intra-hospital | Attending: Physical Medicine & Rehabilitation | Admitting: Physical Medicine & Rehabilitation

## 2014-08-24 DIAGNOSIS — F319 Bipolar disorder, unspecified: Secondary | ICD-10-CM | POA: Diagnosis present

## 2014-08-24 DIAGNOSIS — Z7982 Long term (current) use of aspirin: Secondary | ICD-10-CM | POA: Diagnosis not present

## 2014-08-24 DIAGNOSIS — E1165 Type 2 diabetes mellitus with hyperglycemia: Secondary | ICD-10-CM | POA: Diagnosis present

## 2014-08-24 DIAGNOSIS — E876 Hypokalemia: Secondary | ICD-10-CM

## 2014-08-24 DIAGNOSIS — Z89612 Acquired absence of left leg above knee: Secondary | ICD-10-CM

## 2014-08-24 DIAGNOSIS — IMO0002 Reserved for concepts with insufficient information to code with codable children: Secondary | ICD-10-CM | POA: Insufficient documentation

## 2014-08-24 DIAGNOSIS — F419 Anxiety disorder, unspecified: Secondary | ICD-10-CM | POA: Diagnosis present

## 2014-08-24 DIAGNOSIS — D62 Acute posthemorrhagic anemia: Secondary | ICD-10-CM | POA: Diagnosis present

## 2014-08-24 DIAGNOSIS — K219 Gastro-esophageal reflux disease without esophagitis: Secondary | ICD-10-CM | POA: Diagnosis present

## 2014-08-24 DIAGNOSIS — E1151 Type 2 diabetes mellitus with diabetic peripheral angiopathy without gangrene: Principal | ICD-10-CM | POA: Diagnosis not present

## 2014-08-24 DIAGNOSIS — Z794 Long term (current) use of insulin: Secondary | ICD-10-CM | POA: Diagnosis not present

## 2014-08-24 DIAGNOSIS — R609 Edema, unspecified: Secondary | ICD-10-CM | POA: Diagnosis present

## 2014-08-24 DIAGNOSIS — Z720 Tobacco use: Secondary | ICD-10-CM | POA: Diagnosis not present

## 2014-08-24 DIAGNOSIS — R509 Fever, unspecified: Secondary | ICD-10-CM | POA: Diagnosis not present

## 2014-08-24 DIAGNOSIS — Z8673 Personal history of transient ischemic attack (TIA), and cerebral infarction without residual deficits: Secondary | ICD-10-CM

## 2014-08-24 DIAGNOSIS — I10 Essential (primary) hypertension: Secondary | ICD-10-CM | POA: Diagnosis present

## 2014-08-24 DIAGNOSIS — E785 Hyperlipidemia, unspecified: Secondary | ICD-10-CM | POA: Diagnosis present

## 2014-08-24 DIAGNOSIS — F1010 Alcohol abuse, uncomplicated: Secondary | ICD-10-CM | POA: Diagnosis present

## 2014-08-24 DIAGNOSIS — Z79899 Other long term (current) drug therapy: Secondary | ICD-10-CM

## 2014-08-24 DIAGNOSIS — D72829 Elevated white blood cell count, unspecified: Secondary | ICD-10-CM | POA: Diagnosis not present

## 2014-08-24 DIAGNOSIS — R410 Disorientation, unspecified: Secondary | ICD-10-CM | POA: Diagnosis present

## 2014-08-24 DIAGNOSIS — R7989 Other specified abnormal findings of blood chemistry: Secondary | ICD-10-CM | POA: Diagnosis present

## 2014-08-24 DIAGNOSIS — R600 Localized edema: Secondary | ICD-10-CM

## 2014-08-24 LAB — TYPE AND SCREEN
ABO/RH(D): A POS
ANTIBODY SCREEN: NEGATIVE
UNIT DIVISION: 0
UNIT DIVISION: 0
Unit division: 0

## 2014-08-24 LAB — CBC
HCT: 30.5 % — ABNORMAL LOW (ref 39.0–52.0)
Hemoglobin: 10 g/dL — ABNORMAL LOW (ref 13.0–17.0)
MCH: 27.8 pg (ref 26.0–34.0)
MCHC: 32.8 g/dL (ref 30.0–36.0)
MCV: 84.7 fL (ref 78.0–100.0)
PLATELETS: 317 10*3/uL (ref 150–400)
RBC: 3.6 MIL/uL — ABNORMAL LOW (ref 4.22–5.81)
RDW: 14.6 % (ref 11.5–15.5)
WBC: 8.1 10*3/uL (ref 4.0–10.5)

## 2014-08-24 LAB — RPR: RPR: NONREACTIVE

## 2014-08-24 LAB — AMMONIA
Ammonia: 34 umol/L — ABNORMAL HIGH (ref 11–32)
Ammonia: 33 umol/L — ABNORMAL HIGH (ref 11–32)

## 2014-08-24 LAB — COMPREHENSIVE METABOLIC PANEL
ALT: 90 U/L — ABNORMAL HIGH (ref 0–53)
AST: 119 U/L — AB (ref 0–37)
Albumin: 1.7 g/dL — ABNORMAL LOW (ref 3.5–5.2)
Alkaline Phosphatase: 78 U/L (ref 39–117)
Anion gap: 8 (ref 5–15)
BUN: 5 mg/dL — ABNORMAL LOW (ref 6–23)
CALCIUM: 7.9 mg/dL — AB (ref 8.4–10.5)
CO2: 28 mmol/L (ref 19–32)
Chloride: 102 mmol/L (ref 96–112)
Creatinine, Ser: 1.12 mg/dL (ref 0.50–1.35)
GFR calc non Af Amer: 76 mL/min — ABNORMAL LOW (ref >90)
GFR, EST AFRICAN AMERICAN: 88 mL/min — AB (ref >90)
Glucose, Bld: 132 mg/dL — ABNORMAL HIGH (ref 70–99)
Potassium: 3.1 mmol/L — ABNORMAL LOW (ref 3.5–5.1)
SODIUM: 138 mmol/L (ref 135–145)
Total Bilirubin: 0.6 mg/dL (ref 0.3–1.2)
Total Protein: 5.8 g/dL — ABNORMAL LOW (ref 6.0–8.3)

## 2014-08-24 LAB — FOLATE: Folate: 14.2 ng/mL

## 2014-08-24 LAB — VITAMIN B12: Vitamin B-12: 1526 pg/mL — ABNORMAL HIGH (ref 211–911)

## 2014-08-24 LAB — HIV ANTIBODY (ROUTINE TESTING W REFLEX): HIV SCREEN 4TH GENERATION: NONREACTIVE

## 2014-08-24 LAB — GLUCOSE, CAPILLARY
GLUCOSE-CAPILLARY: 114 mg/dL — AB (ref 70–99)
GLUCOSE-CAPILLARY: 152 mg/dL — AB (ref 70–99)
GLUCOSE-CAPILLARY: 93 mg/dL (ref 70–99)
Glucose-Capillary: 119 mg/dL — ABNORMAL HIGH (ref 70–99)
Glucose-Capillary: 156 mg/dL — ABNORMAL HIGH (ref 70–99)

## 2014-08-24 MED ORDER — ENSURE PUDDING PO PUDG: 1.0000 | Freq: Three times a day (TID) | ORAL | Status: DC

## 2014-08-24 MED ORDER — PROCHLORPERAZINE 25 MG RE SUPP: 12.5000 mg | Freq: Four times a day (QID) | RECTAL | Status: DC | PRN

## 2014-08-24 MED ORDER — QUETIAPINE FUMARATE 50 MG PO TABS
50.0000 mg | ORAL_TABLET | Freq: Three times a day (TID) | ORAL | Status: DC
  Administered 2014-08-24 – 2014-08-31 (×20): 50 mg via ORAL
  Filled 2014-08-24 (×24): qty 1

## 2014-08-24 MED ORDER — SENNOSIDES-DOCUSATE SODIUM 8.6-50 MG PO TABS: 1.0000 | ORAL_TABLET | Freq: Every evening | ORAL | Status: DC | PRN

## 2014-08-24 MED ORDER — INSULIN ASPART 100 UNIT/ML ~~LOC~~ SOLN: 0.0000 [IU] | Freq: Every day | SUBCUTANEOUS | Status: DC

## 2014-08-24 MED ORDER — ASPIRIN EC 325 MG PO TBEC
325.0000 mg | DELAYED_RELEASE_TABLET | Freq: Every day | ORAL | Status: DC
  Administered 2014-08-25 – 2014-08-31 (×7): 325 mg via ORAL
  Filled 2014-08-24 (×8): qty 1

## 2014-08-24 MED ORDER — PROCHLORPERAZINE EDISYLATE 5 MG/ML IJ SOLN: 5.0000 mg | Freq: Four times a day (QID) | INTRAMUSCULAR | Status: DC | PRN

## 2014-08-24 MED ORDER — ATENOLOL 25 MG PO TABS: 25.0000 mg | ORAL_TABLET | Freq: Two times a day (BID) | ORAL | Status: DC

## 2014-08-24 MED ORDER — PHENOL 1.4 % MT LIQD
1.0000 | OROMUCOSAL | Status: DC | PRN
  Filled 2014-08-24: qty 177

## 2014-08-24 MED ORDER — ATENOLOL 25 MG PO TABS: 100.0000 mg | ORAL_TABLET | Freq: Every day | ORAL | Status: DC

## 2014-08-24 MED ORDER — ADULT MULTIVITAMIN W/MINERALS CH: 1.0000 | ORAL_TABLET | Freq: Every day | ORAL | Status: AC

## 2014-08-24 MED ORDER — FOLIC ACID 1 MG PO TABS: 1.0000 mg | ORAL_TABLET | Freq: Every day | ORAL | Status: DC

## 2014-08-24 MED ORDER — PIPERACILLIN-TAZOBACTAM 3.375 G IVPB
3.3750 g | Freq: Three times a day (TID) | INTRAVENOUS | Status: DC
  Administered 2014-08-25 (×2): 3.375 g via INTRAVENOUS
  Filled 2014-08-24 (×4): qty 50

## 2014-08-24 MED ORDER — AMPHETAMINE-DEXTROAMPHETAMINE 10 MG PO TABS
15.0000 mg | ORAL_TABLET | Freq: Every day | ORAL | Status: DC
  Administered 2014-08-26 – 2014-08-31 (×6): 15 mg via ORAL
  Filled 2014-08-24 (×7): qty 2

## 2014-08-24 MED ORDER — TRAZODONE HCL 50 MG PO TABS: 25.0000 mg | ORAL_TABLET | Freq: Every evening | ORAL | Status: DC | PRN

## 2014-08-24 MED ORDER — METHOCARBAMOL 500 MG PO TABS
500.0000 mg | ORAL_TABLET | Freq: Four times a day (QID) | ORAL | Status: DC | PRN
  Administered 2014-08-27: 500 mg via ORAL
  Filled 2014-08-24: qty 1

## 2014-08-24 MED ORDER — GUAIFENESIN-DM 100-10 MG/5ML PO SYRP: 5.0000 mL | ORAL_SOLUTION | Freq: Four times a day (QID) | ORAL | Status: DC | PRN

## 2014-08-24 MED ORDER — INSULIN ASPART 100 UNIT/ML ~~LOC~~ SOLN
0.0000 [IU] | Freq: Three times a day (TID) | SUBCUTANEOUS | Status: DC
  Administered 2014-08-26 – 2014-08-28 (×5): 2 [IU] via SUBCUTANEOUS
  Administered 2014-08-28: 3 [IU] via SUBCUTANEOUS
  Administered 2014-08-29 – 2014-08-30 (×4): 2 [IU] via SUBCUTANEOUS
  Administered 2014-08-30: 3 [IU] via SUBCUTANEOUS
  Administered 2014-08-31 (×2): 2 [IU] via SUBCUTANEOUS

## 2014-08-24 MED ORDER — CLONAZEPAM 0.5 MG PO TABS: 0.2500 mg | ORAL_TABLET | Freq: Three times a day (TID) | ORAL | Status: DC | PRN

## 2014-08-24 MED ORDER — PANTOPRAZOLE SODIUM 40 MG PO TBEC
40.0000 mg | DELAYED_RELEASE_TABLET | Freq: Every day | ORAL | Status: DC
  Administered 2014-08-25 – 2014-08-31 (×7): 40 mg via ORAL
  Filled 2014-08-24 (×7): qty 1

## 2014-08-24 MED ORDER — ADULT MULTIVITAMIN W/MINERALS CH
1.0000 | ORAL_TABLET | Freq: Every day | ORAL | Status: DC
  Administered 2014-08-25 – 2014-08-31 (×7): 1 via ORAL
  Filled 2014-08-24 (×8): qty 1

## 2014-08-24 MED ORDER — VITAMIN B-1 100 MG PO TABS
100.0000 mg | ORAL_TABLET | Freq: Every day | ORAL | Status: DC
  Administered 2014-08-25 – 2014-08-31 (×7): 100 mg via ORAL
  Filled 2014-08-24 (×8): qty 1

## 2014-08-24 MED ORDER — PIPERACILLIN-TAZOBACTAM 3.375 G IVPB: 3.3750 g | Freq: Three times a day (TID) | INTRAVENOUS | Status: DC

## 2014-08-24 MED ORDER — ALUM & MAG HYDROXIDE-SIMETH 200-200-20 MG/5ML PO SUSP: 15.0000 mL | ORAL | Status: DC | PRN

## 2014-08-24 MED ORDER — TRAMADOL HCL 50 MG PO TABS
50.0000 mg | ORAL_TABLET | Freq: Four times a day (QID) | ORAL | Status: DC | PRN
  Administered 2014-08-30 – 2014-08-31 (×2): 50 mg via ORAL
  Filled 2014-08-24 (×2): qty 1

## 2014-08-24 MED ORDER — ENOXAPARIN SODIUM 40 MG/0.4ML ~~LOC~~ SOLN
40.0000 mg | SUBCUTANEOUS | Status: DC
  Administered 2014-08-25 – 2014-08-31 (×7): 40 mg via SUBCUTANEOUS
  Filled 2014-08-24 (×7): qty 0.4

## 2014-08-24 MED ORDER — BUPROPION HCL 100 MG PO TABS
100.0000 mg | ORAL_TABLET | Freq: Three times a day (TID) | ORAL | Status: DC
Start: 2014-08-24 — End: 2014-08-25
  Administered 2014-08-25 (×2): 100 mg via ORAL
  Filled 2014-08-24 (×7): qty 1

## 2014-08-24 MED ORDER — DIPHENHYDRAMINE HCL 25 MG PO CAPS
25.0000 mg | ORAL_CAPSULE | Freq: Four times a day (QID) | ORAL | Status: DC | PRN
  Administered 2014-08-24 – 2014-08-25 (×3): 50 mg via ORAL
  Administered 2014-08-26 – 2014-08-29 (×6): 25 mg via ORAL
  Filled 2014-08-24: qty 2
  Filled 2014-08-24: qty 1
  Filled 2014-08-24: qty 2
  Filled 2014-08-24 (×5): qty 1
  Filled 2014-08-24: qty 2
  Filled 2014-08-24: qty 1

## 2014-08-24 MED ORDER — POTASSIUM CHLORIDE CRYS ER 20 MEQ PO TBCR
40.0000 meq | EXTENDED_RELEASE_TABLET | Freq: Two times a day (BID) | ORAL | Status: DC
  Administered 2014-08-24: 40 meq via ORAL
  Filled 2014-08-24: qty 2

## 2014-08-24 MED ORDER — NICOTINE 21 MG/24HR TD PT24
21.0000 mg | MEDICATED_PATCH | Freq: Every day | TRANSDERMAL | Status: DC
  Administered 2014-08-25 – 2014-08-31 (×7): 21 mg via TRANSDERMAL
  Filled 2014-08-24 (×8): qty 1

## 2014-08-24 MED ORDER — OXYCODONE HCL 5 MG PO TABS
15.0000 mg | ORAL_TABLET | ORAL | Status: DC | PRN
  Administered 2014-08-24 – 2014-08-30 (×23): 15 mg via ORAL
  Filled 2014-08-24 (×24): qty 3

## 2014-08-24 MED ORDER — ACETAMINOPHEN 325 MG PO TABS
325.0000 mg | ORAL_TABLET | ORAL | Status: DC | PRN
  Administered 2014-08-29 (×2): 650 mg via ORAL
  Filled 2014-08-24 (×2): qty 2

## 2014-08-24 MED ORDER — FOLIC ACID 1 MG PO TABS
1.0000 mg | ORAL_TABLET | Freq: Every day | ORAL | Status: DC
  Administered 2014-08-25 – 2014-08-31 (×7): 1 mg via ORAL
  Filled 2014-08-24 (×8): qty 1

## 2014-08-24 MED ORDER — OXYCODONE HCL 15 MG PO TABS: 15.0000 mg | ORAL_TABLET | ORAL | Status: DC | PRN

## 2014-08-24 MED ORDER — GABAPENTIN 300 MG PO CAPS
300.0000 mg | ORAL_CAPSULE | Freq: Three times a day (TID) | ORAL | Status: DC
  Administered 2014-08-24 – 2014-08-31 (×20): 300 mg via ORAL
  Filled 2014-08-24 (×24): qty 1

## 2014-08-24 MED ORDER — ATENOLOL 25 MG PO TABS
25.0000 mg | ORAL_TABLET | Freq: Every day | ORAL | Status: DC
  Administered 2014-08-24: 25 mg via ORAL
  Filled 2014-08-24: qty 1

## 2014-08-24 MED ORDER — INSULIN GLARGINE 100 UNIT/ML ~~LOC~~ SOLN
40.0000 [IU] | Freq: Every day | SUBCUTANEOUS | Status: DC
  Administered 2014-08-24 – 2014-08-30 (×7): 40 [IU] via SUBCUTANEOUS
  Filled 2014-08-24 (×8): qty 0.4

## 2014-08-24 MED ORDER — FENTANYL 12 MCG/HR TD PT72
12.5000 ug | MEDICATED_PATCH | TRANSDERMAL | Status: DC
  Administered 2014-08-24 – 2014-08-30 (×3): 12.5 ug via TRANSDERMAL
  Filled 2014-08-24 (×3): qty 1

## 2014-08-24 MED ORDER — METFORMIN HCL 500 MG PO TABS: 500.0000 mg | ORAL_TABLET | Freq: Two times a day (BID) | ORAL | Status: DC

## 2014-08-24 MED ORDER — ENSURE PUDDING PO PUDG
1.0000 | Freq: Three times a day (TID) | ORAL | Status: DC
  Administered 2014-08-25: 1 via ORAL

## 2014-08-24 MED ORDER — BISACODYL 10 MG RE SUPP: 10.0000 mg | Freq: Every day | RECTAL | Status: DC | PRN

## 2014-08-24 MED ORDER — ATENOLOL 25 MG PO TABS
25.0000 mg | ORAL_TABLET | Freq: Two times a day (BID) | ORAL | Status: DC
  Administered 2014-08-25 – 2014-08-27 (×6): 25 mg via ORAL
  Filled 2014-08-24 (×10): qty 1

## 2014-08-24 MED ORDER — THIAMINE HCL 100 MG PO TABS: 100.0000 mg | ORAL_TABLET | Freq: Every day | ORAL | Status: DC

## 2014-08-24 MED ORDER — PROCHLORPERAZINE MALEATE 5 MG PO TABS
5.0000 mg | ORAL_TABLET | Freq: Four times a day (QID) | ORAL | Status: DC | PRN
  Filled 2014-08-24: qty 2

## 2014-08-24 MED ORDER — ATORVASTATIN CALCIUM 40 MG PO TABS
40.0000 mg | ORAL_TABLET | Freq: Every morning | ORAL | Status: DC
  Administered 2014-08-25 – 2014-08-31 (×7): 40 mg via ORAL
  Filled 2014-08-24 (×7): qty 1

## 2014-08-24 MED ORDER — FLEET ENEMA 7-19 GM/118ML RE ENEM: 1.0000 | ENEMA | Freq: Once | RECTAL | Status: AC | PRN

## 2014-08-24 MED ORDER — POTASSIUM CHLORIDE CRYS ER 20 MEQ PO TBCR
40.0000 meq | EXTENDED_RELEASE_TABLET | Freq: Two times a day (BID) | ORAL | Status: AC
  Administered 2014-08-24: 40 meq via ORAL
  Filled 2014-08-24: qty 2
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
## 2014-08-24 NOTE — PMR Pre-admission (Signed)
PMR Admission Coordinator Pre-Admission Assessment  Patient: [name] [name] is an 49 y.o., male MRN: [id number] DOB: 03/06/1966 Height:  (188 cm) Weight: 100.3 kg (221 lb 1.9 oz)              Insurance Information  PRIMARY: Medicaid [place] Access      Policy#: [id number]      Subscriber: self Benefits:  Phone #: [phone]      Eff. 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  CTA revealed no flow at the common femoral artery level or iliac level on the left and patient underwent axillary to femoral bypass on right to left Fem-Fem bypass on 02/19  with attempts at limb salvage.  On 02/20, patient spiked a temp of 103 with AMS and decrease in UOP. He was started on IV fluids as well as IV antibiotics for septic shock. Dr. Arbie Cookey felt that sepsis due to ischemic L-BKA and AKA v/s hip disarticulation disused with patient. Patient underwent high L-BKA on 02/21 and has effervesced. Post op with lethargy as well as difficulty with processing due to delirium.  Fentanyl patch added to help with pain management and hypokalemia being treated. ABLA being monitored. PT/OT evaluation done today and CIR  recommended for follow up therapy.  Past Medical History  Past Medical History  Diagnosis Date  . GERD (gastroesophageal reflux disease)   . Nicotine addiction   . ETOH abuse   . Anxiety   . Hypertension     no pcp     saw peter nisham once  . Peripheral vascular disease   . Hyperlipidemia   . Collagen vascular disease   . Type II diabetes mellitus   . History of blood transfusion 08/2013    "maybe when he had his amputation"  . 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 Chronic lower back pain   . Depression   . Bipolar disorder     Family History  family history includes Hypertension in his mother.  Prior Rehab/Hospitalizations: pt was at [facility] in March 2015 for L BKA and had very limited home health follow up at that time. Pt had CVA in 2013 and had a few home health visits then as well.   Current Medications   Current facility-administered medications:  .  acetaminophen (TYLENOL) tablet 325-650 mg, 325-650 mg, Oral, Q4H PRN, 650 mg at 08/21/14 0431 **OR** acetaminophen (TYLENOL) suppository 325-650 mg, 325-650 mg, Rectal, Q4H PRN, Ames Coupe Rhyne, PA-C .  alum & mag hydroxide-simeth (MAALOX/MYLANTA) 200-200-20 MG/5ML suspension 15-30 mL, 15-30 mL, Oral, Q2H PRN, Samantha J Rhyne, PA-C .  amphetamine-dextroamphetamine (ADDERALL) tablet 15 mg, 15 mg, Oral, Daily, Samantha J Rhyne, PA-C, 15 mg at 08/24/14 0933 .  aspirin EC tablet 325 mg, 325 mg, Oral, Daily, Samantha J Rhyne, PA-C, 325 mg at 08/24/14 0934 .  atenolol (TENORMIN) tablet 25 mg, 25 mg, Oral, Daily, Samantha J Rhyne, PA-C .  atorvastatin (LIPITOR) tablet 40 mg, 40 mg, Oral, q morning - 10a, Samantha J Rhyne, PA-C, 40 mg at 08/24/14 0932 .  bisacodyl (DULCOLAX) suppository 10 mg, 10 mg, Rectal, Daily PRN, Raymond Gurney, PA-C .  buPROPion [facility]) tablet 100 mg, 100 mg, Oral, TID, Samantha J Rhyne, PA-C, 100 mg at 08/24/14 0933 .   diphenhydrAMINE (BENADRYL) capsule 25-50 mg, 25-50 mg, Oral, Q6H PRN, Roma Kayser Schorr, NP, 25 mg at 08/24/14 0933 .  docusate sodium (COLACE) capsule 100 mg, 100 mg, Oral, Daily, Ranae Plumber Trinh, PA-C, 100 mg at 08/24/14 0932 .  enoxaparin (LOVENOX) injection 40 mg, 40 mg, Subcutaneous, Q24H, Samantha J Rhyne, PA-C, 40 mg at 08/24/14 0936 .  feeding supplement (ENSURE) (ENSURE) pudding 1 Container, 1 Container, Oral, TID WC, Samantha J Rhyne, PA-C, 1 Container at 08/24/14 0825 .  fentaNYL (DURAGESIC - dosed mcg/hr) 25 mcg, 25 mcg, Transdermal, Q72H, Jacquelin Hawking, MD, 25 mcg at 08/21/14 1254 .  folic acid (FOLVITE) tablet 1 mg, 1 mg, Oral, Daily, Drema Dallas, MD, 1 mg at 08/24/14 0935 .  gabapentin (NEURONTIN) capsule 300 mg, 300 mg, Oral, TID, Duayne Cal, NP, 300 mg at 08/24/14 0934 .  insulin aspart (novoLOG) injection 0-15 Units, 0-15 Units, Subcutaneous, TID WC, Samantha J Rhyne, PA-C, 5 Units at 08/23/14 1703 .  insulin glargine (LANTUS) injection 40 Units, 40 Units, Subcutaneous, QHS, Samantha J Rhyne, PA-C, 40 Units at 08/23/14 2300 .  LORazepam (ATIVAN) tablet 1 mg, 1 mg, Oral, Q6H PRN **OR** LORazepam (ATIVAN) injection 1 mg, 1 mg, Intravenous, Q6H PRN, Drema Dallas, MD .  metoprolol (LOPRESSOR) injection 2-5 mg, 2-5 mg, Intravenous, Q2H PRN, Samantha J Rhyne, PA-C .  multivitamin with minerals tablet 1 tablet, 1 tablet, Oral, Daily, Drema Dallas, MD, 1 tablet at 08/24/14 0935 .  nicotine (NICODERM CQ - dosed in mg/24 hours) patch 21 mg, 21 mg, Transdermal, Daily, Drema Dallas, MD, 21 mg at 08/24/14 0935 .  ondansetron (ZOFRAN) injection 4 mg, 4 mg, Intravenous, Q6H PRN, Ames Coupe Rhyne, PA-C, 4 mg at 08/17/14 0907 .  oxyCODONE (Oxy IR/ROXICODONE) immediate release tablet 15 mg, 15 mg, Oral, Q4H PRN, Ames Coupe Rhyne, PA-C, 15 mg at 08/24/14 0631 .  pantoprazole (PROTONIX) EC tablet 40 mg, 40 mg, Oral, Daily, Samantha J Rhyne, PA-C, 40 mg at 08/24/14 0932 .  phenol  (CHLORASEPTIC) mouth spray 1 spray, 1 spray, Mouth/Throat, PRN, Samantha J Rhyne, PA-C .  piperacillin-tazobactam (ZOSYN) IVPB 3.375 g, 3.375 g, Intravenous, 3 times per day, Almon Hercules, RPH, 3.375 g at 08/24/14 1610 .  potassium chloride SA (K-DUR,KLOR-CON) CR tablet 40 mEq, 40 mEq, Oral, BID, Samantha J Rhyne, PA-C, 40 mEq at 08/24/14 0933 .  QUEtiapine (SEROQUEL) tablet 50 mg, 50 mg, Oral, TID, Jacquelin Hawking, MD, 50 mg at 08/24/14 0935 .  thiamine (VITAMIN B-1) tablet 100 mg, 100 mg, Oral, Daily, 100 mg at 08/24/14 0935 **OR** [DISCONTINUED] thiamine (B-1) injection 100 mg, 100 mg, Intravenous, Daily, Drema Dallas, MD  Patients Current Diet: Diet Carb  Modified  Precautions / Restrictions Precautions Precautions: Fall Precaution Comments: L AKA Restrictions Weight Bearing Restrictions: No   Prior Activity Level Household: Pt mainly stayed at home. Pt is not working. Caregiver Haywood LassoLynette is available as needed and does all the needed errands. Pt has residual speech needs and R UE sensory changes from previous CVA in 2013.  Caregiver helps with bathing and dressing at times and she performs the housework and cooking.   Home Assistive Devices / Equipment Home Assistive Devices/Equipment: Wheelchair, Environmental consultantWalker (specify type), Prosthesis, Other (Comment), CBG Meter, Bedside commode/3-in-1, Shower chair without back (LLE prosthesis) Home Equipment: Walker - 2 wheels, Bedside commode, Tub bench, Wheelchair - manual  Prior Functional Level Prior Function Level of Independence: Needs assistance Gait / Transfers Assistance Needed: pt can typically perform transfers and gait in home mod I with RW ADL's / Homemaking Assistance Needed: caregiver helps with bathing and dressing at time and she performs the housework and cooking  Current Functional Level Cognition  Overall Cognitive Status: Impaired/Different from baseline Current Attention Level: Sustained Orientation Level: Oriented X4 Following  Commands: Follows one step commands with increased time (multimodal cues) Safety/Judgement: Decreased awareness of safety, Decreased awareness of deficits General Comments: much improved level of arousal and ability to participate    Extremity Assessment (includes Sensation/Coordination)  Upper Extremity Assessment: Generalized weakness, Difficult to assess due to impaired cognition  Lower Extremity Assessment: Defer to PT evaluation    ADLs  Overall ADL's : Needs assistance/impaired Eating/Feeding: Set up, Bed level Eating/Feeding Details (indicate cue type and reason): instructed girlfriend to allow pt to self feed Grooming: Wash/dry hands, Wash/dry face, Minimal assistance, Sitting Lower Body Dressing: Total assistance, Bed level Lower Body Dressing Details (indicate cue type and reason): R sock Toilet Transfer: +2 for physical assistance, Minimal assistance, BSC, RW (simulated) Toileting- Clothing Manipulation and Hygiene: Total assistance, +2 for physical assistance, Sit to/from stand Toileting - Clothing Manipulation Details (indicate cue type and reason): pt unable to release walker in standing for ADL General ADL Comments: Pt currently requiring total assist due to impaired cognition and lethargy.    Mobility  Overal bed mobility: Needs Assistance Bed Mobility: Supine to Sit Rolling: Max assist Supine to sit: Min assist, HOB elevated Sit to supine: +2 for physical assistance, Max assist General bed mobility comments: heavy use of rails, assist with pad to advance hips, increased time    Transfers  Overall transfer level: Needs assistance Equipment used: Rolling walker (2 wheeled) Transfers: Sit to/from Stand, Stand Pivot Transfers Sit to Stand: +2 physical assistance, Max assist, From elevated surface (with use of pad to raise hips) Stand pivot transfers: +2 physical assistance, Min assist General transfer comment: increased time and effort, verbal and physical cues for  technique    Ambulation / Gait / Stairs / Wheelchair Mobility   not assessed, anticipate needs   Posture / Balance Balance Overall balance assessment: Needs assistance Sitting balance-Leahy Scale: Fair Standing balance-Leahy Scale: Poor    Special needs/care consideration BiPAP/CPAP no CPM no  Continuous Drip IV no  Dialysis no         Life Vest no  Oxygen no  Special Bed no  Trach Size no Wound Vac (area) no        Skin - current L AKA healing incision                               Bowel mgmt: last BM on  08-23-14 using BSC with assistance Bladder mgmt: using urinal Diabetic mgmt - managing at home with medications/insulin.   Previous Home Environment Living Arrangements: Alone  Lives With: Alone (but caregiver can be avail as needed) Available Help at Discharge: Friend(s), Available 24 hours/day Type of Home: Apartment Home Layout: One level Home Access: Stairs to enter Entrance Stairs-Rails: None Entrance Stairs-Number of Steps: 1 Bathroom Shower/Tub: Curtain, Associate Professor: No (walks sideways with RW through door) Home Care Services: Yes Type of Home Care Services: Homehealth aide Home Care Agency (if known): Home Health Agency Additional Comments: PLOF provided by caregiver due to pt cognition  Discharge Living Setting Plans for Discharge Living Setting: Patient's home, Apartment Type of Home at Discharge: Apartment Discharge Home Layout: One level Discharge Home Access: Level entry, Stairs to enter Entrance Stairs-Number of Steps: 1 Discharge Bathroom Shower/Tub: Tub/shower unit, Curtain Discharge Bathroom Toilet: Standard Does the patient have any problems obtaining your medications?: No (caregiver does the driving.)  Social/Family/Support Systems Patient Roles: Other (Comment) Contact Information: Mother and caregiver are primary contacts Anticipated Caregiver: caregiver Lynette Anticipated Caregiver's  Contact Information: see above Ability/Limitations of Caregiver: no limitations from caregiver Caregiver Availability: 24/7 Discharge Plan Discussed with Primary Caregiver: Yes (spoke with pt's mother and caregiver ) Is Caregiver In Agreement with Plan?: Yes Does Caregiver/Family have Issues with Lodging/Transportation while Pt is in Rehab?: No  Goals/Additional Needs Patient/Family Goal for Rehab: Supervision and Mod Ind with PT/OT; NA for SLP Expected length of stay: 10-14 days Cultural Considerations: none Dietary Needs: carb modified Equipment Needs: to be determined Pt/Family Agrees to Admission and willing to participate: Yes (spoke with pt, his mother and caregiver sev. times) Program Orientation Provided & Reviewed with Pt/Caregiver Including Roles  & Responsibilities: Yes   Decrease burden of Care through IP rehab admission: NA   Possible need for SNF placement upon discharge: not anticipated  Patient Condition: This patient's medical and functional status has changed since the consult dated: 08-22-14 in which the Rehabilitation Physician determined and documented that the patient's condition is appropriate for intensive rehabilitative care in an inpatient rehabilitation facility. See "History of Present Illness" (above) for medical update. Functional changes are: maximal assistance x 2 for limited transfers. Patient's medical and functional status update has been discussed with the Rehabilitation physician and patient remains appropriate for inpatient rehabilitation. Will admit to inpatient rehab today.  Preadmission Screen Completed By:  Juliann Mule, PT, 08/24/2014 11:13 AM ______________________________________________________________________   Discussed status with Dr. Wynn Banker on 08-24-14 at 1113 and received telephone approval for admission today.  Admission Coordinator:  Juliann Mule, PT, time 1113/Date 08-24-14

## 2014-08-24 NOTE — Progress Notes (Addendum)
Rehab admissions - I am following pt's case and discussed pt's status with rehab MD and PA. Pt is an appropriate candidate for CIR and we received medical clearance from [place], [place] with vascular. I called and updated pt and his caregiver. I will complete admission paperwork with pt/caregiver shortly.  I updated Henrietta, case Production designer, theatre/television/filmmanager and [place] with social work.  Please call me with any questions.   Juliann MuleJanine Harjit Douds, PT Rehabilitation Admissions Coordinator [phone]

## 2014-08-24 NOTE — Progress Notes (Addendum)
Progress Note    08/24/2014 7:57 AM 4 Days Post-Op  Subjective:  C/o stump soreness-states he exercised it yesterday  Tm 99.3 now 98.9 120's-130's systolic HR 90's-100's NSR 95% RA  Filed Vitals:   08/24/14 0336  BP: 153/95  Pulse:   Temp:   Resp: 13    Physical Exam: Cardiac:  regular Lungs:  Non labored Incisions:  Right upper chest incision is c/d/i with steri strips in place; right groin c/d/i with steri strips in place; left groin is clean and dry with a small superficial separation of the skin; left AKA incision is c/d/i with staples in tact.  Edema in stump is improved this am Extremities:  + palpable right DP; right great toe about the same-maybe slight improvement; sensation and motor are in tact. + palpable fem-fem graft pulse   CBC    Component Value Date/Time   WBC 8.1 08/24/2014 0305   RBC 3.60* 08/24/2014 0305   HGB 10.0* 08/24/2014 0305   HCT 30.5* 08/24/2014 0305   PLT 317 08/24/2014 0305   MCV 84.7 08/24/2014 0305   MCH 27.8 08/24/2014 0305   MCHC 32.8 08/24/2014 0305   RDW 14.6 08/24/2014 0305   LYMPHSABS 1.8 08/16/2014 1715   MONOABS 0.8 08/16/2014 1715   EOSABS 0.1 08/16/2014 1715   BASOSABS 0.1 08/16/2014 1715    BMET    Component Value Date/Time   NA 138 08/24/2014 0305   K 3.1* 08/24/2014 0305   CL 102 08/24/2014 0305   CO2 28 08/24/2014 0305   GLUCOSE 132* 08/24/2014 0305   BUN 5* 08/24/2014 0305   CREATININE 1.12 08/24/2014 0305   CALCIUM 7.9* 08/24/2014 0305   GFRNONAA 76* 08/24/2014 0305   GFRAA 88* 08/24/2014 0305    INR    Component Value Date/Time   INR 1.29 08/20/2014 1625     Intake/Output Summary (Last 24 hours) at 08/24/14 0757 Last data filed at 08/24/14 [id number]  Gross per 24 hour  Intake   2376 ml  Output   2550 ml  Net   -174 ml     Assessment:  49 y.o. male is s/p:  Axillary to femoral bypass on the right with 8 mm Hemashield graft right to left femorofemoral bypass with 8 mm Hemashield graft 6 Days  Post-Op And Left AKA   4 Days Post-Op  Plan: -patent axillary to bifem bypass graft  -pt's fevers continue to improve - only low grade 2x yesterday -blood cx continue NGTD -stump continues to look good with mild erythema anteriorly .  Edema improved this am.  Continue to monitor closely.  Continue ABx. -slight separation of left groin incision that is superficial-continue to monitor -heplock IV -acute blood loss anemia improved with PRBC's -hypokalemia-supplement K-dur 40mEq bid x 2 doses -check labs in am -DVT prophylaxis:  Heparin gtt -ok for CIR today - okay to transfer to 2W if not going to CIR today-will check back later this morning to see CIR recommendations -continue to encourage nutrtion -continue PT/OT -pt on atenolol at home-will start back low dose this am.  Was on 50 mg bid at home.  Will start back at 25mg  daily.  Pt was also on Lisinopril at home-will continue to hold this at this time. -will resume Metformin as his creatinine has improved to 500mg  bid (he was on 1000mg  bid at home). Hold glucotrol and add back slowly.  Would appreciate hospitalist input on DM meds for discharge to CIR if any changes from above and will await  final recs from [facility] before ordering.   Doreatha Massed, PA-C [facility] [phone] 08/24/2014 7:57 AM

## 2014-08-24 NOTE — Discharge Summary (Signed)
Discharge Summary     [name] LauthRobert [name] 20-Dec-1965 49 y.o. 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 who presents to the emergency room with an ischemic left below-knee amputation stump. His past history is significant for stroke from a severe carotid disease and subsequent endarterectomy by Dr. Darrick Pennafields in February 2013. He had the bilateral lower extremity arterial insufficiency and underwent aortobifemoral bypass by Dr. Darrick Pennafields in July 2013 he subsequently presented with severe ischemia of his left leg and underwent CT angiogram in March 2015. This revealed occlusion of his right and left limb of his aortobifemoral bypass. Into the operating room on 08/31/2013 and underwent thrombectomies of his right and left limb of his aortofemoral bypass. Subsequent underwent left to right femorofemoral bypass and several days later underwent left below-knee amputation. He eventually healed below-knee amputation. Over the past several months he has had increasing pain in his left below-knee amputation. He initially had no skin breakdown but over the last several weeks has had skin breakdown over the tibia. He presents to the emergency room this evening reporting worsening pain. He feels that his right leg is somewhat cold but does not note any acute change and has motor and sensory function intact in his right foot. He does not have any cardiac history  Hospital Course:  The patient was admitted to the hospital and taken to the operating room on 08/16/2014 - 08/20/2014 and underwent: Axillary to femoral bypass on the right with 8 mm Hemashield graft right to left femorofemoral bypass with 8 mm Hemashield graft    The pt tolerated the procedure well and was transported to the PACU in good condition.   By  POD 1, he continued to have fever, blood cx were ordered as well as urine cx and CCM was consulted for presumed sepsis.  He was placed on broad coverage ABx.  He had multifactorial acute encephalopathy and pain medications were minimized and pt was given supportive tx.  He did have a bump in his creatinine to 1.90 on arrival to the hospital.  At that time, his ACEI was discontinued.  His creatinine has returned to normal.  He also has had hypokalemia that has been supplemented throughout his hospitalization.    By POD 2, the pt was clearly septic and the left BKA stump appeared to be the source.  He was taken to the operating room that morning and underwent a left AKA.  he has shown increasing signs of sepsis with fever, tachycardia, and hypotension, despite fluid boluses and broad-spectrum antibiotics. I think the amputation site is the most likely source of sepsis and I think he we should proceed urgently with a high above-the-knee amputation on the left. I have explained that the main risk is that this this does not heal he would require a hip disarticulation in the future. In addition, I'm concerned that if there is ischemic muscle in the proximal thigh that this could become a life-threatening problem. We will proceed urgently today with high left above-the-knee amputation. We will continue his broad-spectrum antibiotics. I have discussed the situation with his girlfriend and also with his mother. We have discussed these risks and they are agreeable to proceed.  ABI's on 08/20/14 were as follows:  RIGHT    LEFT    PRESSURE WAVEFORM  PRESSURE WAVEFORM  BRACHIAL 120 Triphasic  BRACHIAL    DP   DP    AT 106 Monophasic  AT    PT 99 Monophasic  PT    PER   PER    GREAT TOE  NA GREAT TOE  NA    RIGHT LEFT  ABI 0.88 AKA       After his amputation, his leukocytosis improved as well as his fevers.  He lethargy also improved.  On POD 3/1, he did  have a hint of pulmonary congestion and there was low threshold for diuresis.  He was transferred to the stepdown unit this day.  Seroquel was restarted.  On POD 5/3, he did receive a banana bag and thiamine and folic acid was added.  Ensure pudding was also added for nutrition to promote wound healing.  PT/OT re-evaluated the pt and he did well and is appropriate for CIR.   He did seem more edematous on this day and he was gently diuresed.  This was improved the next am.  His foley was also removed and he was able to void without difficulty.    His stump is healing nicely with staples in place.  He did have very mild erythema of the stump on POD 5/3 and this will need to be monitored closely.  We will leave him on Zosyn while he is in rehab and see how his stump progresses.  He also had acute blood loss anemia and did receive a total of 5 PRBC's throughout his hospitalization.  His blood cultures and urine culture had no growth.  He continues to have a 2+ palpable right DP as well as palpable graft pulse.    He does have an ischemic area of the tip of the right great toe.  This is slightly improved.  Continue to monitor this.  He does have a retention sock on his left AKA stump with ABD pads.    We will restart his atenolol at a low dose of 25mg  daily.  Continue to hold lisinopril now.  Do not want the pt to become hypotensive and potentially clot graft.  He is also on a SSI for his diabetes.  He has not been on Metformin while in the hospital.  Will restart this back at 500mg  bid.  Hold glucotrol for now and add back slowly.  The remainder of the hospital course consisted of increasing mobilization and increasing intake of solids without difficulty.  CBC    Component Value Date/Time   WBC 8.1 08/24/2014 0305   RBC 3.60* 08/24/2014 0305   HGB 10.0* 08/24/2014 0305   HCT 30.5* 08/24/2014 0305   PLT 317 08/24/2014 0305   MCV 84.7 08/24/2014 0305   MCH 27.8 08/24/2014 0305   MCHC 32.8  08/24/2014 0305   RDW 14.6 08/24/2014 0305   LYMPHSABS 1.8 08/16/2014 1715   MONOABS 0.8 08/16/2014 1715   EOSABS 0.1 08/16/2014 1715   BASOSABS 0.1 08/16/2014 1715    BMET    Component Value Date/Time   NA 138 08/24/2014 0305   K 3.1* 08/24/2014 0305   CL 102 08/24/2014 0305   CO2 28 08/24/2014 0305   GLUCOSE 132* 08/24/2014 0305   BUN 5* 08/24/2014 0305   CREATININE 1.12 08/24/2014 0305   CALCIUM 7.9* 08/24/2014 0305   GFRNONAA 76* 08/24/2014 0305   GFRAA 88* 08/24/2014 0305     Discharge Instructions:   The patient is discharged with extensive instructions on wound care and progressive ambulation.  They are instructed not to drive or perform any heavy lifting until returning to see the physician in his office.  Discharge Instructions    Call MD for:  redness, tenderness, or signs of infection (pain, swelling, bleeding, redness, odor or green/yellow discharge around incision site)    Complete by:  As directed      Call MD for:  severe or increased pain, loss or decreased feeling  in affected limb(s)    Complete by:  As directed      Call MD for:  temperature >100.5    Complete by:  As directed      Discharge wound care:    Complete by:  As directed   Wash the groin wound with soap and water daily and pat dry. (No tub bath-only shower)  Then put a dry gauze or washcloth there to keep this area dry daily and as needed.  Do not use Vaseline or neosporin on your incisions.  Only use soap and water on your incisions and then protect and keep dry. Retention sock with ABD pads to left AKA stump.     Lifting restrictions    Complete by:  As directed   No heavy lifting for 4 weeks     Resume previous diet    Complete by:  As directed            Discharge Diagnosis:  Pre-op evaluation [Z01.818] Ischemia of site of left below knee amputation [T87.89, I99.9]  Secondary Diagnosis: Patient Active Problem List   Diagnosis Date Noted  . Unilateral AKA   . S/P femoropopliteal  bypass surgery   . Delirium   . Alcohol abuse   . Tobacco abuse   . Benign essential hypertension antepartum   . Anxiety state   . Fever   . Pulmonary edema   . Surgery, other elective   . Low urine output 08/19/2014  . Fever of undetermined origin 08/19/2014  . Ischemia of site of left below knee amputation 08/16/2014  . Visit for wound check-Left ABK 10/20/2013  . Wound dehiscence 10/13/2013  . Aftercare following surgery of the circulatory system, NEC 09/29/2013  . Confusion, postoperative 09/16/2013  . Unilateral complete BKA 09/09/2013  . Ischemic leg 08/30/2013  . Incisional hernia without mention of obstruction or gangrene 04/06/2013  . PAD (peripheral artery disease) 03/30/2013  . Peripheral vascular disease, unspecified 01/22/2012  . Diabetes mellitus 01/16/2012  . PVD (peripheral vascular disease) 01/13/2012  . Uncontrolled hypertension   . GERD (gastroesophageal reflux disease)   . Nicotine addiction   . Anxiety   . Occlusion and stenosis of carotid artery without mention of cerebral infarction 10/02/2011  . Atherosclerosis of native arteries of the extremities with intermittent claudication 10/02/2011  . Claudication 09/04/2011  . Stroke 08/11/2011   Past Medical History  Diagnosis Date  . GERD (gastroesophageal reflux disease)   . Nicotine addiction   . ETOH abuse   . Anxiety   . Hypertension     no pcp     saw peter nisham once  . Peripheral vascular disease   . Hyperlipidemia   . Collagen vascular disease   . Type II diabetes mellitus   . History of blood transfusion 08/2013    "maybe when he had his amputation"  . 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 Chronic lower back pain   . Depression   . Bipolar disorder  Medication List    STOP taking these medications        clonazePAM 1 MG tablet  Commonly known as:  KLONOPIN     collagenase ointment  Commonly known as:   SANTYL     glipiZIDE 10 MG tablet  Commonly known as:  GLUCOTROL     ibuprofen 200 MG tablet  Commonly known as:  ADVIL,MOTRIN     lisinopril 20 MG tablet  Commonly known as:  PRINIVIL,ZESTRIL     methocarbamol 750 MG tablet  Commonly known as:  ROBAXIN     naproxen 500 MG tablet  Commonly known as:  NAPROSYN     traMADol 50 MG tablet  Commonly known as:  ULTRAM      TAKE these medications        ALPRAZolam 1 MG tablet  Commonly known as:  XANAX  Take 1 mg by mouth 3 (three) times daily.     amphetamine-dextroamphetamine 10 MG tablet  Commonly known as:  ADDERALL  Take 1 tablet (10 mg total) by mouth daily.     aspirin 325 MG EC tablet  Take 325 mg by mouth daily.     atenolol 25 MG tablet  Commonly known as:  TENORMIN  Take 4 tablets (100 mg total) by mouth daily.     atorvastatin 40 MG tablet  Commonly known as:  LIPITOR  Take 40 mg by mouth every morning.     buPROPion 100 MG tablet  Commonly known as:  WELLBUTRIN  Take 100 mg by mouth 3 (three) times daily.     famotidine 10 MG tablet  Commonly known as:  PEPCID  Take 1 tablet (10 mg total) by mouth 2 (two) times daily.     feeding supplement (ENSURE) Pudg  Take 1 Container by mouth 3 (three) times daily with meals.     FLUoxetine 20 MG capsule  Commonly known as:  PROZAC  Take 1 capsule (20 mg total) by mouth daily.     folic acid 1 MG tablet  Commonly known as:  FOLVITE  Take 1 tablet (1 mg total) by mouth daily.     gabapentin 300 MG capsule  Commonly known as:  NEURONTIN  Take 1 capsule (300 mg total) by mouth 3 (three) times daily.     hydrochlorothiazide 25 MG tablet  Commonly known as:  HYDRODIURIL  Take 1 tablet (25 mg total) by mouth daily.     insulin glargine 100 UNIT/ML injection  Commonly known as:  LANTUS  Inject 0.4 mLs (40 Units total) into the skin at bedtime.     insulin NPH-regular Human (70-30) 100 UNIT/ML injection  Commonly known as:  NOVOLIN 70/30  Inject 0-16 Units  into the skin 2 (two) times daily with a meal. Sliding scale     iron polysaccharides 150 MG capsule  Commonly known as:  NIFEREX  Take 1 capsule (150 mg total) by mouth 2 times daily at 12 noon and 4 pm.     metFORMIN 500 MG tablet  Commonly known as:  GLUCOPHAGE  Take 1 tablet (500 mg total) by mouth 2 (two) times daily with a meal.     multivitamin with minerals Tabs tablet  Take 1 tablet by mouth daily.     nicotine 14 mg/24hr patch  Commonly known as:  NICODERM CQ - dosed in mg/24 hours  14 mg patch daily x2 weeks then 7 mg patch daily x2 weeks and stop     omeprazole 20 MG capsule  Commonly known as:  PRILOSEC  Take 20 mg by mouth daily.     oxyCODONE 15 MG immediate release tablet  Commonly known as:  ROXICODONE  Take 1 tablet (15 mg total) by mouth every 4 (four) hours as needed for breakthrough pain.     piperacillin-tazobactam 3.375 GM/50ML IVPB  Commonly known as:  ZOSYN  Inject 50 mLs (3.375 g total) into the vein every 8 (eight) hours.     Potassium Chloride ER 20 MEQ Tbcr  Take 20 mEq by mouth daily.     QUEtiapine 50 MG tablet  Commonly known as:  SEROQUEL  Take 1 tablet (50 mg total) by mouth at bedtime.     thiamine 100 MG tablet  Take 1 tablet (100 mg total) by mouth daily.          Disposition: CIR  Patient's condition: is Good  Follow up: 1. Dr. Arbie Cookey in 2 weeks   Doreatha Massed, PA-C [facility] [phone] 08/24/2014  9:18 AM   - For VQI Registry use --- Instructions: Press F2 to tab through selections.  Delete question if not applicable.   Post-op:  Wound infection: No  Graft infection: No  Transfusion: Yes  If yes, 5 units given New Arrhythmia: No Ipsilateral amputation: No,  Minor,  BKA,  AKA Discharge patency: [x ] Primary,  Primary assisted,  Secondary,  Occluded Patency judged by:  Dopper only, [x ] Palpable graft pulse, [x ] Palpable distal pulse right,  ABI inc. > 0.15,   Duplex Discharge ABI: R 0.88, L AKA Discharge TBI: R , L  D/C Ambulatory Status: Ambulatory with Assistance  Complications: MI: No,  Troponin only,  EKG or Clinical CHF: No Resp failure:No,  Pneumonia,  Ventilator Chg in renal function: No-this improved after his surgery,  Inc. Cr > 0.5,  Temp. Dialysis,  Permanent dialysis Stroke: No,  Minor,  Major Return to OR: Yes for AKA  Reason for return to OR:  Bleeding,  Infection,  Thrombosis,  Revision  AKA  Discharge medications: Statin use:  yes ASA use:  yes Plavix use:  no Beta blocker use: yes Coumadin use: no

## 2014-08-24 NOTE — Progress Notes (Signed)
Pt alert and oriented x 4. Report called to nurse in rehab. No signs of respiratory distress and no c/o pain. Pt family at bedside. IV removed. Pt tolerated well. VSS. Discharge teaching done and charted. Jillyn HiddenStone,Jadamarie Butson R, RN
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
## 2014-08-25 ENCOUNTER — Inpatient Hospital Stay (HOSPITAL_COMMUNITY): Payer: Self-pay | Admitting: Physical Therapy

## 2014-08-25 ENCOUNTER — Inpatient Hospital Stay (HOSPITAL_COMMUNITY): Payer: Self-pay

## 2014-08-25 DIAGNOSIS — H43813 Vitreous degeneration, bilateral: Secondary | ICD-10-CM

## 2014-08-25 DIAGNOSIS — Z89612 Acquired absence of left leg above knee: Secondary | ICD-10-CM

## 2014-08-25 LAB — CBC WITH DIFFERENTIAL/PLATELET
BASOS PCT: 0 % (ref 0–1)
Basophils Absolute: 0 10*3/uL (ref 0.0–0.1)
Eosinophils Absolute: 0.2 10*3/uL (ref 0.0–0.7)
Eosinophils Relative: 3 % (ref 0–5)
HEMATOCRIT: 30.9 % — AB (ref 39.0–52.0)
HEMOGLOBIN: 9.9 g/dL — AB (ref 13.0–17.0)
LYMPHS ABS: 1.4 10*3/uL (ref 0.7–4.0)
LYMPHS PCT: 21 % (ref 12–46)
MCH: 27.4 pg (ref 26.0–34.0)
MCHC: 32 g/dL (ref 30.0–36.0)
MCV: 85.6 fL (ref 78.0–100.0)
MONOS PCT: 8 % (ref 3–12)
Monocytes Absolute: 0.5 10*3/uL (ref 0.1–1.0)
NEUTROS ABS: 4.5 10*3/uL (ref 1.7–7.7)
NEUTROS PCT: 68 % (ref 43–77)
Platelets: 337 10*3/uL (ref 150–400)
RBC: 3.61 MIL/uL — AB (ref 4.22–5.81)
RDW: 14.4 % (ref 11.5–15.5)
WBC: 6.5 10*3/uL (ref 4.0–10.5)

## 2014-08-25 LAB — GLUCOSE, CAPILLARY
GLUCOSE-CAPILLARY: 118 mg/dL — AB (ref 70–99)
Glucose-Capillary: 94 mg/dL (ref 70–99)
Glucose-Capillary: 86 mg/dL (ref 70–99)
Glucose-Capillary: 104 mg/dL — ABNORMAL HIGH (ref 70–99)

## 2014-08-25 LAB — CULTURE, BLOOD (ROUTINE X 2)
Culture: NO GROWTH
Culture: NO GROWTH

## 2014-08-25 LAB — COMPREHENSIVE METABOLIC PANEL
ALK PHOS: 73 U/L (ref 39–117)
ALT: 102 U/L — AB (ref 0–53)
AST: 114 U/L — ABNORMAL HIGH (ref 0–37)
Albumin: 1.8 g/dL — ABNORMAL LOW (ref 3.5–5.2)
Anion gap: 8 (ref 5–15)
BUN: <5 mg/dL — ABNORMAL LOW (ref 6–23)
CO2: 27 mmol/L (ref 19–32)
Calcium: 8.3 mg/dL — ABNORMAL LOW (ref 8.4–10.5)
Chloride: 105 mmol/L (ref 96–112)
Creatinine, Ser: 1.07 mg/dL (ref 0.50–1.35)
GFR calc Af Amer: >90 mL/min (ref >90)
GFR, EST NON AFRICAN AMERICAN: 80 mL/min — AB (ref >90)
Glucose, Bld: 94 mg/dL (ref 70–99)
Potassium: 3.6 mmol/L (ref 3.5–5.1)
Sodium: 140 mmol/L (ref 135–145)
Total Bilirubin: 0.4 mg/dL (ref 0.3–1.2)
Total Protein: 5.7 g/dL — ABNORMAL LOW (ref 6.0–8.3)

## 2014-08-25 MED ORDER — ENSURE PUDDING PO PUDG
1.0000 | Freq: Two times a day (BID) | ORAL | Status: DC
  Administered 2014-08-26 – 2014-08-29 (×6): 1 via ORAL

## 2014-08-25 MED ORDER — BUPROPION HCL 100 MG PO TABS
100.0000 mg | ORAL_TABLET | Freq: Two times a day (BID) | ORAL | Status: DC
  Administered 2014-08-25 – 2014-08-31 (×12): 100 mg via ORAL
  Filled 2014-08-25 (×14): qty 1

## 2014-08-25 MED ORDER — ALPRAZOLAM 0.5 MG PO TABS
1.0000 mg | ORAL_TABLET | Freq: Three times a day (TID) | ORAL | Status: DC | PRN
  Administered 2014-08-25: 1 mg via ORAL
  Filled 2014-08-25: qty 2

## 2014-08-25 MED ORDER — GLUCERNA SHAKE PO LIQD
237.0000 mL | Freq: Every day | ORAL | Status: DC
  Administered 2014-08-26 – 2014-08-30 (×4): 237 mL via ORAL

## 2014-08-25 MED ORDER — ALPRAZOLAM 0.5 MG PO TABS
0.5000 mg | ORAL_TABLET | Freq: Three times a day (TID) | ORAL | Status: DC
Start: 2014-08-25 — End: 2014-08-31
  Administered 2014-08-26 – 2014-08-31 (×16): 0.5 mg via ORAL
  Filled 2014-08-25 (×17): qty 1

## 2014-08-25 NOTE — Care Management Note (Signed)
    Page 1 of 1   08/25/2014     9:18:22 AM CARE MANAGEMENT NOTE 08/25/2014  Patient:  [name] [name],[name] [name]   Account Number:  [id number]  Date Initiated:  08/21/2014  Documentation initiated by:  Avie ArenasBROWN,SARAH  Subjective/Objective Assessment:   Admitted for L axillo brachial BFBG - previous BKA - that became septic - now post op AKA     Anticipated DC Date:  08/24/2014   Anticipated DC Plan:  IP REHAB FACILITY     DC Planning Services  CM consult      Status of service:  Completed, signed off Medicare Important Message given?  NO (If response is "NO", the following Medicare IM given date fields will be blank)  Discharge Disposition:  IP REHAB FACILITY  Per UR Regulation:  Reviewed for med. necessity/level of care/duration of stay  If discussed at Long Length of Stay Meetings, dates discussed:   08/22/2014  08/24/2014   Comments:  Contact: Staci AcostaKindley,Lynette Friend [phone]   Jones,Helen Mother [phone]  08/23/14 1329 Vee Bahe RN MSN BSN CCM Pt possibly a good candidate for inpt rehab per therapies; discussed with attending.   08-21-14 9am Avie ArenasSarah Brown, RNBSN [phone] At home prior.  At some point prior has been in CIR - may be a candidate again.  CM will continue to follow.
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
## 2014-08-25 NOTE — Progress Notes (Addendum)
  Progress Note    08/25/2014 8:55 AM * No surgery found *  Subjective:  No complaints this am-states he is feeling good  Afebrile x 24 hrs 150's-160's systolic HR 70's-90's (70's-80's since Atenolol restarted) 100% RA  Filed Vitals:   08/25/14 0543  BP: 159/85  Pulse: 82  Temp: 97.4 F (36.3 C)  Resp:     Physical Exam: Cardiac:  regular Lungs:  Non labored Incisions:  Right groin incision is c/d/i; left groin with very small superficial separation of skin edges-small amount of serous drainage on bandage; left stump is c/d/i with staples in tact-non drainage Extremities:  Tip of right great toe is unchanged; 2+ palpable right DP; right foot is warm   CBC    Component Value Date/Time   WBC 6.5 08/25/2014 0451   RBC 3.61* 08/25/2014 0451   HGB 9.9* 08/25/2014 0451   HCT 30.9* 08/25/2014 0451   PLT 337 08/25/2014 0451   MCV 85.6 08/25/2014 0451   MCH 27.4 08/25/2014 0451   MCHC 32.0 08/25/2014 0451   RDW 14.4 08/25/2014 0451   LYMPHSABS 1.4 08/25/2014 0451   MONOABS 0.5 08/25/2014 0451   EOSABS 0.2 08/25/2014 0451   BASOSABS 0.0 08/25/2014 0451    BMET    Component Value Date/Time   NA 140 08/25/2014 0451   K 3.6 08/25/2014 0451   CL 105 08/25/2014 0451   CO2 27 08/25/2014 0451   GLUCOSE 94 08/25/2014 0451   BUN <5* 08/25/2014 0451   CREATININE 1.07 08/25/2014 0451   CALCIUM 8.3* 08/25/2014 0451   GFRNONAA 80* 08/25/2014 0451   GFRAA >90 08/25/2014 0451    INR    Component Value Date/Time   INR 1.29 08/20/2014 1625     Intake/Output Summary (Last 24 hours) at 08/25/14 0855 Last data filed at 08/25/14 0400  Gross per 24 hour  Intake    240 ml  Output    400 ml  Net   -160 ml     Assessment:  49 y.o. male is s/p:  Axillary to femoral bypass on the right with 8 mm Hemashield graft right to left femorofemoral bypass with 8 mm Hemashield graft 7 Days Post-Op And Left AKA   5 Days Post-Op    Plan: -pt doing well this am with palpable  fem fem graft pulse and palpable right DP. -left stump continues to heal nicely  -will d/c Zosyn after this mornings dose -HR improved with atenolol -DVT prophylaxis:  Lovenox -pt is progressing nicely -will check back on pt on Sunday-please call before if any issues   Doreatha MassedSamantha Rhyne, PA-C [facility] [phone] 08/25/2014 8:55 AM    I have examined the patient, reviewed and agree with above. We'll stop antibiotics with no active infection and all cultures negative  Lawrencia Mauney, MD 08/25/2014 9:08 AM

## 2014-08-25 NOTE — Progress Notes (Signed)
49 y.o. 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  CTA revealed no flow at the common femoral artery level or iliac level on the left and patient underwent axillary to femoral bypass on right to left Fem-Fem bypass on 02/19  with attempts at limb salvage.  On 02/20, patient spiked a temp of 103 with AMS and decrease in UOP. He was started on IV fluids as well as IV antibiotics for septic shock. Dr. Donnetta Hutching felt that sepsis due to ischemic L-BKA and AKA v/s hip disarticulation disused with patient. Patient underwent high L-AKA on 02/21 and has defervesced. Post op with lethargy as well as difficulty with processing due to delirium.  Fentanyl patch added to help with pain management and hypokalemia being treated. He has had issues with fluctuating bouts of lethargy with anxiety, hypotension requiring fluid bolus with banana bag as well as transfusion with 2 units PRBC for ABLA.  IVF d/c and he was treated with diuretics due to evidence of fluid overload.  He continues on IV zosyn for FUO with recommendations to continue this for now with close monitoring of wounds due to concerns of breakdown.  Subjective/Complaints: Caregiver indicates pt was on BID wellbutrin at home and was on xanax rather than klonopin at home D/C summ from vascular indicates xanax 43m TID but pt states he was on 0.573m feels very tired today  Review of Systems - Negative except tired  Objective: Vital Signs: Blood pressure 159/85, pulse 82, temperature 97.4 F (36.3 C), temperature source Oral, resp. rate 18, SpO2 100 %. No results found. Results for orders placed or performed during the hospital encounter of 08/24/14 (from the past 72 hour(s))  Glucose, capillary     Status: Abnormal   Collection Time: 08/24/14  9:16 PM  Result Value Ref  Range   Glucose-Capillary 156 (H) 70 - 99 mg/dL  Ammonia     Status: Abnormal   Collection Time: 08/24/14 10:33 PM  Result Value Ref Range   Ammonia 33 (H) 11 - 32 umol/L  CBC WITH DIFFERENTIAL     Status: Abnormal   Collection Time: 08/25/14  4:51 AM  Result Value Ref Range   WBC 6.5 4.0 - 10.5 K/uL   RBC 3.61 (L) 4.22 - 5.81 MIL/uL   Hemoglobin 9.9 (L) 13.0 - 17.0 g/dL   HCT 30.9 (L) 39.0 - 52.0 %   MCV 85.6 78.0 - 100.0 fL   MCH 27.4 26.0 - 34.0 pg   MCHC 32.0 30.0 - 36.0 g/dL   RDW 14.4 11.5 - 15.5 %   Platelets 337 150 - 400 K/uL   Neutrophils Relative % 68 43 - 77 %   Neutro Abs 4.5 1.7 - 7.7 K/uL   Lymphocytes Relative 21 12 - 46 %   Lymphs Abs 1.4 0.7 - 4.0 K/uL   Monocytes Relative 8 3 - 12 %   Monocytes Absolute 0.5 0.1 - 1.0 K/uL   Eosinophils Relative 3 0 - 5 %   Eosinophils Absolute 0.2 0.0 - 0.7 K/uL   Basophils Relative 0 0 - 1 %   Basophils Absolute 0.0 0.0 - 0.1 K/uL  Comprehensive metabolic panel     Status: Abnormal   Collection Time: 08/25/14  4:51 AM  Result Value Ref Range   Sodium 140 135 - 145 mmol/L  Potassium 3.6 3.5 - 5.1 mmol/L   Chloride 105 96 - 112 mmol/L   CO2 27 19 - 32 mmol/L   Glucose, Bld 94 70 - 99 mg/dL   BUN <5 (L) 6 - 23 mg/dL   Creatinine, Ser 1.07 0.50 - 1.35 mg/dL   Calcium 8.3 (L) 8.4 - 10.5 mg/dL   Total Protein 5.7 (L) 6.0 - 8.3 g/dL   Albumin 1.8 (L) 3.5 - 5.2 g/dL   AST 114 (H) 0 - 37 U/L   ALT 102 (H) 0 - 53 U/L   Alkaline Phosphatase 73 39 - 117 U/L   Total Bilirubin 0.4 0.3 - 1.2 mg/dL   GFR calc non Af Amer 80 (L) >90 mL/min   GFR calc Af Amer >90 >90 mL/min    Comment: (NOTE) The eGFR has been calculated using the CKD EPI equation. This calculation has not been validated in all clinical situations. eGFR's persistently <90 mL/min signify possible Chronic Kidney Disease.    Anion gap 8 5 - 15  Glucose, capillary     Status: None   Collection Time: 08/25/14  6:45 AM  Result Value Ref Range   Glucose-Capillary  94 70 - 99 mg/dL     HEENT: poor dentition Cardio: RRR and no murmur Resp: CTA B/L and unlabored GI: BS positive and NT, ND Extremity:  Edema 1+ Right pretibial Skin:   Wound C/D/I Neuro: Lethargic, Abnormal Sensory decrease LT R foot and Abnormal Motor 5/5 in BUE, 4/5 RLE, 3- L HF Musc/Skel:  Other Left AKA GEN NAD   Assessment/Plan: 1. Functional deficits secondary to Left AKA, RLE PAD with ischemia which require 3+ hours per day of interdisciplinary therapy in a comprehensive inpatient rehab setting. Physiatrist is providing close team supervision and 24 hour management of active medical problems listed below. Physiatrist and rehab team continue to assess barriers to discharge/monitor patient progress toward functional and medical goals. FIM:             FIM - Bed/Chair Transfer Bed/Chair Transfer: 5: Supine > Sit: Supervision (verbal cues/safety issues), 5: Sit > Supine: Supervision (verbal cues/safety issues)  FIM - Locomotion: Wheelchair Distance: 20 FIM - Locomotion: Ambulation Ambulation/Gait Assistance: 1: +2 Total assist, 2: Max assist (+2 for safety)  Comprehension Comprehension Mode: Auditory Comprehension: 5-Follows basic conversation/direction: With no assist  Expression Expression Mode: Verbal Expression: 5-Expresses basic needs/ideas: With no assist  Social Interaction Social Interaction: 3-Interacts appropriately 50 - 74% of the time - May be physically or verbally inappropriate.  Problem Solving Problem Solving: 2-Solves basic 25 - 49% of the time - needs direction more than half the time to initiate, plan or complete simple activities  Memory Memory: 6-More than reasonable amt of time   Medical Problem List and Plan: 1. Functional deficits secondary to  Left BKA revised to AKA. RLE PAD with ischemia 2.  DVT Prophylaxis/Anticoagulation: Pharmaceutical: Lovenox 3. Pain Management: Continue  Neurontin for neuropathic symptoms. On Fentanyl with  oxycodone prn for break thorough pain. Monitor for excessive sedation on narcotics.   4. Bipolar disorder/Mood: His behavior with anxiety, fluctuating mental status and confusion are similar and well known from last admission. Team to provide ego support to help patient to work thorough behavioral issues. Will have neuropsych follow up also to help with coping skills. Continue Wellbutrin and Seroquel for mood stabilization. On Adderall chronically to help with activation/energy? Discrepancy on how patient was taking meds--xanax .80m tid and seroquel 100 mg tid with  300 mg at  bedtime?.  5. Neuropsych: This patient is not capable of making decisions on his own behalf. 6. Skin/Wound Care: Routine pressure relief measures. Monitor wound daily for healing as well as RLE for any ischemia. 7. Fluids/Electrolytes/Nutrition: Monitor I/O. Offer sugar free supplements between meals to avoid BS variations. Monitor daily weights.    8. Abnormal LFTs: Due to shocked liver. Does have history of alcohol abuse--will check ammonia levels. Will monitor for now. No GI symptoms reported.   9. Wound prophylaxis/Fevers: UCS 2/20- no growth.  Blood cultures X 2 pending.  Continue IV zosyn per [facility] recommendations--Day # 6 10. HTN:  Monitor BP every 8 hours. Medications resumed today--monitor for effectiveness and titrate as indicated.   11. Tachycardia: Was on atenolol bid at home. Will resume at lower dose and monitor for effectiveness.   13. DM type 2: Monitor BS with ac/hs checks. Continue lantus insulin with SSI for elevated BS. Continue to hold metformin for now.  LOS (Days) 1 A FACE TO FACE EVALUATION WAS PERFORMED  KIRSTEINS,ANDREW E 08/25/2014, 10:36 AM
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
## 2014-08-25 NOTE — Plan of Care (Signed)
Problem: Food- and Nutrition-Related Knowledge Deficit (NB-1.1) Goal: Nutrition education Formal process to instruct or train a patient/client in a skill or to impart knowledge to help patients/clients voluntarily manage or modify food choices and eating behavior to maintain or improve health. Outcome: Completed/Met Date Met:  08/25/14  RD consulted for nutrition education regarding diabetes.     Lab Results  Component Value Date    HGBA1C 10.5* 08/16/2014    Sister present during time of visit. RD provided "Carbohydrate Counting for People with Diabetes" handout from the Academy of [employer]. Discussed different food groups and their effects on blood sugar, emphasizing carbohydrate-containing foods. Provided list of carbohydrates and recommended serving sizes of common foods.  Discussed importance of controlled and consistent carbohydrate intake throughout the day. Recommended 5 servings of carbohydrates at each meal. Emphasized adequate protein intake. Provided examples of ways to balance meals/snacks and encouraged intake of high-fiber, whole grain complex carbohydrates. Pt reports he usually drinks 6-7 cups of sweet tea a day. Discussed diabetic friendly drink options and to limit sugar sweetened beverages. Pt was willing to limit the amount consumed. Pt opposed to diet drinks. Teach back method used.  Expect fair compliance.  Kallie Locks, MS, RD, LDN Pager # [phone] After hours/ weekend pager # [phone]
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
## 2014-08-25 NOTE — Progress Notes (Signed)
Inpatient Glycemic Control Team note  08/25/14- Patient was seen and evaluated on 08/18/14 by inpatient diabetes team member Garnette Czech. She met the patient and Ms. Lambert Mody. Notes state; "Ms. Lambert Mody states she is his Optician, dispensing at home in Mr. Rastetter's  apartment for 3 hours/day. Patient gives his own insulin and checks own CBG's. Ms. Lambert Mody prepares meals. Patient goes to his family doctor, Dr. Jimmye Norman ([place]) monthly. He has a mental health therapist that visits him weekly at his apartment." At that time, Garnette Czech recommended "short-term Home Health RN and Dietitian to evaluate and make recommendations regarding patient's diabetes self-care at home in an effort to improve glycemic control and hopefully delay further complications. Outpatient diabetes education referral would not be appropriate in his case given his homebound status. Despite lethargy, patient agreeable to this plan.".    CBG ideal in hospital taking Lantus 40 units at hs, requiring very little Novolog correction.  Ordered dietitian consult.   Gentry Fitz, RN, BA, MHA, CDE Diabetes Coordinator Inpatient Diabetes Program  [phone] (Team Pager) [phone] [facility]) 08/25/2014 10:36 AM

## 2014-08-25 NOTE — Progress Notes (Signed)
Patient information reviewed and entered into eRehab system by Betti Goodenow, RN, CRRN, PPS Coordinator.  Information including medical coding and functional independence measure will be reviewed and updated through discharge.    

## 2014-08-25 NOTE — Progress Notes (Signed)
Social Work Patient ID: [name] [name], male   DOB: 1966/05/20, 49 y.o.   MRN: [id number]   Attempted to meet with pt to complete psychosocial assessment.  Several family members present and pt asks to delay interview.  Will check back later this afternoon and if deferred again will see pt on Monday.  Velencia Lenart, LCSW

## 2014-08-25 NOTE — IPOC Note (Addendum)
Overall Plan of Care [facility]) Patient Details Name: [name] [name] MRN: [id number] DOB: 1965/07/18  Admitting Diagnosis: L AKA revision of L BKA CONFUSION    Hospital Problems: Principal Problem:   Status post above knee amputation of left lower extremity Active Problems:   Diabetes type 2, uncontrolled   PVD (posterior vitreous detachment)     Functional Problem List: Nursing Edema, Endurance, Medication Management, Pain, Safety, Skin Integrity  PT Balance, Skin Integrity, Endurance, Motor, Pain, Safety, Sensory  OT Balance, Safety, Cognition  SLP    TR Activity tolerance, functional mobility, balance, cognition, safety, pain, anxiety/stress        Basic ADL's: OT Bathing, Dressing, Toileting, Grooming     Advanced  ADL's: OT Simple Meal Preparation     Transfers: PT Bed Mobility, Bed to Chair, Car, Occupational psychologist, Research scientist (life sciences): PT Ambulation, Psychologist, prison and probation services, Stairs     Additional Impairments: OT None  SLP        TR      Anticipated Outcomes Item Anticipated Outcome  Self Feeding Independent  Swallowing      Basic self-care  Supervision  Toileting  Mod I   Bathroom Transfers Mod I to [facility], Supervision to tub bench  Bowel/Bladder  Patient will remain continent of bowel and bladder  Transfers  Supervision  Locomotion  Supervision at w/c level; Min A for ambulation (household distances)  Communication     Cognition     Pain  Patients pain will remain at or below a 3  Safety/Judgment  Patient will remain free from falls   Therapy Plan: PT Intensity: Minimum of 1-2 x/day ,45 to 90 minutes PT Frequency: 5 out of 7 days PT Duration Estimated Length of Stay: 7-10 days OT Intensity: Minimum of 1-2 x/day, 45 to 90 minutes OT Frequency: 5 out of 7 days OT Duration/Estimated Length of Stay: 5-7 days  TR Duration/ELOS:  7 days TR Frequency:  Min 1 time per week >20 minutes         Team Interventions: Nursing Interventions  Patient/Family Education, Disease Management/Prevention, Pain Management, Medication Management, Skin Care/Wound Management, Discharge Planning, Psychosocial Support  PT interventions Ambulation/gait training, Disease management/prevention, Warden/ranger, Discharge planning, DME/adaptive equipment instruction, Functional mobility training, Patient/family education, Pain management, Neuromuscular re-education, Skin care/wound management, UE/LE Strength taining/ROM, UE/LE Coordination activities, Wheelchair propulsion/positioning, Therapeutic Exercise, Therapeutic Activities, Stair training  OT Interventions Discharge planning, DME/adaptive equipment instruction, Functional mobility training, Pain management, Patient/family education, Warden/ranger, Self Care/advanced ADL retraining, Wheelchair propulsion/positioning, Therapeutic Activities, Therapeutic Exercise  SLP Interventions    TR Interventions Recreation/leisure participation, Balance/Vestibular training, functional mobility, therapeutic activities, UE/LE strength/coordination, cognitive compensation, w/c mobility, community reintegration, pt/family education, adaptive equipment instruction/use, discharge planning, psychosocial support  SW/CM Interventions      Team Discharge Planning: Destination: PT-Home ,OT- Home , SLP-  Projected Follow-up: PT-24 hour supervision/assistance, Outpatient PT, OT-  Other (comment) (intermittent supervision with BADL, Mod A with iADL), SLP-  Projected Equipment Needs: PT-To be determined, OT- To be determined, SLP-  Equipment Details: PT-Pt owns personal w/c and rolling walker, OT-  Patient/family involved in discharge planning: PT- Patient, Family member/caregiver,  OT-Patient, SLP-   MD ELOS: 10-12d Medical Rehab Prognosis:  Good Assessment: 49 y.o. 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 CTA revealed  no flow at the common femoral artery level or iliac level on the left and patient underwent axillary to femoral bypass on right to left Fem-Fem bypass on 02/19 with attempts at limb salvage. On 02/20, patient spiked a temp of 103 with AMS and decrease in UOP. He was started on IV fluids as well as IV antibiotics for septic shock. Dr. Arbie CookeyEarly felt that sepsis due to ischemic L-BKA and AKA v/s hip disarticulation disused with patient. Patient underwent high L-AKA on 02/21 and has defervesced   Now requiring 24/7 Rehab RN,MD, as well as CIR level PT, OT and SLP.  Treatment team will focus on ADLs and mobility, Speech therapy will evaluate cognition given post operative encephalopathy with goals set at Sup/min  See Team Conference Notes for weekly updates to the plan of care
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
## 2014-08-25 NOTE — Progress Notes (Signed)
Andrey FarmerJanine L Janeliz [facility] Admission Coordinator Signed Physical Medicine and Rehabilitation PMR Pre-admission 08/24/2014 11:13 AM  Related encounter: ED to Hosp-Admission (Discharged) from [facility]    Expand All Collapse All   PMR Admission Coordinator Pre-Admission Assessment  Patient: [name] LauthRobert [name] is an 49 y.o., [name] [name] MRN: [id number] DOB: Nov 08, 1965 Height: 6\' 2"  (188 cm) Weight: 100.3 kg (221 lb 1.9 oz)  Insurance Information  PRIMARY: Medicaid [place] Access Policy#: [id number] Subscriber: self Benefits: Phone #: [phone]  Eff. 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 CTA revealed no flow at the common femoral artery level or iliac level on the left and patient underwent axillary to femoral bypass on right to left Fem-Fem bypass on 02/19 with attempts at limb salvage. On 02/20, patient spiked a temp of 103 with AMS and decrease in UOP. He was started on IV  fluids as well as IV antibiotics for septic shock. Dr. Arbie CookeyEarly felt that sepsis due to ischemic L-BKA and AKA v/s hip disarticulation disused with patient. Patient underwent high L-BKA on 02/21 and has effervesced. Post op with lethargy as well as difficulty with processing due to delirium. Fentanyl patch added to help with pain management and hypokalemia being treated. ABLA being monitored. PT/OT evaluation done today and CIR recommended for follow up therapy.  Past Medical History  Past Medical History  Diagnosis Date  . GERD (gastroesophageal reflux disease)   . Nicotine addiction   . ETOH abuse   . Anxiety   . Hypertension     no pcp saw peter nisham once  . Peripheral vascular disease   . Hyperlipidemia   . Collagen vascular disease   . Type II diabetes mellitus   . History of blood transfusion 08/2013    "maybe when he had his amputation"  . 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 Chronic lower back pain   . Depression   . Bipolar disorder     Family History  family history includes Hypertension in his mother.  Prior Rehab/Hospitalizations: [facility] and had very limited home health follow up at that time. Pt had CVA in 2013 and had a few home health visits then as well.  Current Medications   Current facility-administered medications:  . acetaminophen (TYLENOL) tablet 325-650 mg, 325-650 mg, Oral, Q4H PRN, 650 mg at 08/21/14 0431 **OR** acetaminophen (TYLENOL) suppository 325-650 mg, 325-650 mg, Rectal, Q4H PRN, Samantha J Rhyne, PA-C . alum & mag hydroxide-simeth (MAALOX/MYLANTA) 200-200-20 MG/5ML suspension 15-30 mL, 15-30 mL, Oral, Q2H PRN, Samantha J Rhyne, PA-C . amphetamine-dextroamphetamine (ADDERALL) tablet 15 mg, 15 mg, Oral,  Daily, Ames Coupe Rhyne, PA-C, 15 mg at 08/24/14 1610 . aspirin EC tablet 325  mg, 325 mg, Oral, Daily, Samantha J Rhyne, PA-C, 325 mg at 08/24/14 0934 . atenolol (TENORMIN) tablet 25 mg, 25 mg, Oral, Daily, Samantha J Rhyne, PA-C . atorvastatin (LIPITOR) tablet 40 mg, 40 mg, Oral, q morning - 10a, Samantha J Rhyne, PA-C, 40 mg at 08/24/14 0932 . bisacodyl (DULCOLAX) suppository 10 mg, 10 mg, Rectal, Daily PRN, Raymond Gurney, PA-C . buPROPion [facility]) tablet 100 mg, 100 mg, Oral, TID, Samantha J Rhyne, PA-C, 100 mg at 08/24/14 0933 . diphenhydrAMINE (BENADRYL) capsule 25-50 mg, 25-50 mg, Oral, Q6H PRN, Roma Kayser Schorr, NP, 25 mg at 08/24/14 0933 . docusate sodium (COLACE) capsule 100 mg, 100 mg, Oral, Daily, Ranae Plumber Trinh, PA-C, 100 mg at 08/24/14 0932 . enoxaparin (LOVENOX) injection 40 mg, 40 mg, Subcutaneous, Q24H, Samantha J Rhyne, PA-C, 40 mg at 08/24/14 0936 . feeding supplement (ENSURE) (ENSURE) pudding 1 Container, 1 Container, Oral, TID WC, Samantha J Rhyne, PA-C, 1 Container at 08/24/14 0825 . fentaNYL (DURAGESIC - dosed mcg/hr) 25 mcg, 25 mcg, Transdermal, Q72H, Jacquelin Hawking, MD, 25 mcg at 08/21/14 1254 . folic acid (FOLVITE) tablet 1 mg, 1 mg, Oral, Daily, Drema Dallas, MD, 1 mg at 08/24/14 0935 . gabapentin (NEURONTIN) capsule 300 mg, 300 mg, Oral, TID, Duayne Cal, NP, 300 mg at 08/24/14 0934 . insulin aspart (novoLOG) injection 0-15 Units, 0-15 Units, Subcutaneous, TID WC, Samantha J Rhyne, PA-C, 5 Units at 08/23/14 1703 . insulin glargine (LANTUS) injection 40 Units, 40 Units, Subcutaneous, QHS, Samantha J Rhyne, PA-C, 40 Units at 08/23/14 2300 . LORazepam (ATIVAN) tablet 1 mg, 1 mg, Oral, Q6H PRN **OR** LORazepam (ATIVAN) injection 1 mg, 1 mg, Intravenous, Q6H PRN, Drema Dallas, MD . metoprolol (LOPRESSOR) injection 2-5 mg, 2-5 mg, Intravenous, Q2H PRN, Samantha J Rhyne, PA-C . multivitamin with minerals tablet 1 tablet, 1 tablet, Oral, Daily, Drema Dallas, MD, 1 tablet at 08/24/14 0935 . nicotine (NICODERM CQ - dosed in  mg/24 hours) patch 21 mg, 21 mg, Transdermal, Daily, Drema Dallas, MD, 21 mg at 08/24/14 0935 . ondansetron (ZOFRAN) injection 4 mg, 4 mg, Intravenous, Q6H PRN, Ames Coupe Rhyne, PA-C, 4 mg at 08/17/14 0907 . oxyCODONE (Oxy IR/ROXICODONE) immediate release tablet 15 mg, 15 mg, Oral, Q4H PRN, Ames Coupe Rhyne, PA-C, 15 mg at 08/24/14 0631 . pantoprazole (PROTONIX) EC tablet 40 mg, 40 mg, Oral, Daily, Samantha J Rhyne, PA-C, 40 mg at 08/24/14 0932 . phenol (CHLORASEPTIC) mouth spray 1 spray, 1 spray, Mouth/Throat, PRN, Samantha J Rhyne, PA-C . piperacillin-tazobactam (ZOSYN) IVPB 3.375 g, 3.375 g, Intravenous, 3 times per day, Almon Hercules, RPH, 3.375 g at 08/24/14 9604 . potassium chloride SA (K-DUR,KLOR-CON) CR tablet 40 mEq, 40 mEq, Oral, BID, Samantha J Rhyne, PA-C, 40 mEq at 08/24/14 0933 . QUEtiapine (SEROQUEL) tablet 50 mg, 50 mg, Oral, TID, Jacquelin Hawking, MD, 50 mg at 08/24/14 0935 . thiamine (VITAMIN B-1) tablet 100 mg, 100 mg, Oral, Daily, 100 mg at 08/24/14 0935 **OR** [DISCONTINUED] thiamine (B-1) injection 100 mg, 100 mg, Intravenous, Daily, Drema Dallas, MD  Patients Current Diet: Diet Carb Modified  Precautions / Restrictions Precautions Precautions: Fall Precaution Comments: L AKA Restrictions Weight Bearing Restrictions: No   Prior Activity Level Household: Pt mainly stayed at home. Pt is not working. Caregiver Haywood Lasso is available as needed and does all the needed errands. Pt has residual speech needs and R UE sensory changes from  previous CVA in 2013. Caregiver helps with bathing and dressing at times and she performs the housework and cooking.   Home Assistive Devices / Equipment Home Assistive Devices/Equipment: Wheelchair, Environmental consultant (specify type), Prosthesis, Other (Comment), CBG Meter, Bedside commode/3-in-1, Shower chair without back (LLE prosthesis) Home Equipment: Walker - 2 wheels, Bedside commode, Tub bench, Wheelchair - manual  Prior Functional  Level Prior Function Level of Independence: Needs assistance Gait / Transfers Assistance Needed: pt can typically perform transfers and gait in home mod I with RW ADL's / Homemaking Assistance Needed: caregiver helps with bathing and dressing at time and she performs the housework and cooking  Current Functional Level Cognition  Overall Cognitive Status: Impaired/Different from baseline Current Attention Level: Sustained Orientation Level: Oriented X4 Following Commands: Follows one step commands with increased time (multimodal cues) Safety/Judgement: Decreased awareness of safety, Decreased awareness of deficits General Comments: much improved level of arousal and ability to participate   Extremity Assessment (includes Sensation/Coordination)  Upper Extremity Assessment: Generalized weakness, Difficult to assess due to impaired cognition  Lower Extremity Assessment: Defer to PT evaluation    ADLs  Overall ADL's : Needs assistance/impaired Eating/Feeding: Set up, Bed level Eating/Feeding Details (indicate cue type and reason): instructed girlfriend to allow pt to self feed Grooming: Wash/dry hands, Wash/dry face, Minimal assistance, Sitting Lower Body Dressing: Total assistance, Bed level Lower Body Dressing Details (indicate cue type and reason): R sock Toilet Transfer: +2 for physical assistance, Minimal assistance, BSC, RW (simulated) Toileting- Clothing Manipulation and Hygiene: Total assistance, +2 for physical assistance, Sit to/from stand Toileting - Clothing Manipulation Details (indicate cue type and reason): pt unable to release walker in standing for ADL General ADL Comments: Pt currently requiring total assist due to impaired cognition and lethargy.    Mobility  Overal bed mobility: Needs Assistance Bed Mobility: Supine to Sit Rolling: Max assist Supine to sit: Min assist, HOB elevated Sit to supine: +2 for physical assistance, Max assist General bed  mobility comments: heavy use of rails, assist with pad to advance hips, increased time    Transfers  Overall transfer level: Needs assistance Equipment used: Rolling walker (2 wheeled) Transfers: Sit to/from Stand, Stand Pivot Transfers Sit to Stand: +2 physical assistance, Max assist, From elevated surface (with use of pad to raise hips) Stand pivot transfers: +2 physical assistance, Min assist General transfer comment: increased time and effort, verbal and physical cues for technique    Ambulation / Gait / Stairs / Wheelchair Mobility   not assessed, anticipate needs   Posture / Balance Balance Overall balance assessment: Needs assistance Sitting balance-Leahy Scale: Fair Standing balance-Leahy Scale: Poor    Special needs/care consideration BiPAP/CPAP no CPM no  Continuous Drip IV no  Dialysis no  Life Vest no  Oxygen no  Special Bed no  Trach Size no Wound Vac (area) no  Skin - current L AKA healing incision  Bowel mgmt: last BM on 08-23-14 using BSC with assistance Bladder mgmt: using urinal Diabetic mgmt - managing at home with medications/insulin.   Previous Home Environment Living Arrangements: Alone Lives With: Alone (but caregiver can be avail as needed) Available Help at Discharge: Friend(s), Available 24 hours/day Type of Home: Apartment Home Layout: One level Home Access: Stairs to enter Entrance Stairs-Rails: None Entrance Stairs-Number of Steps: 1 Bathroom Shower/Tub: Curtain, Armed forces operational officer Accessibility: No (walks sideways with RW through door) Home Care Services: Yes Type of Home Care Services: Homehealth aide Home Care Agency (if known): Home Health  Agency Additional Comments: PLOF provided by caregiver due to pt cognition  Discharge Living Setting Plans for Discharge Living Setting: Patient's home, Apartment Type of Home at Discharge:  Apartment Discharge Home Layout: One level Discharge Home Access: Level entry, Stairs to enter Entrance Stairs-Number of Steps: 1 Discharge Bathroom Shower/Tub: Tub/shower unit, Curtain Discharge Bathroom Toilet: Standard Does the patient have any problems obtaining your medications?: No (caregiver does the driving.)  Social/Family/Support Systems Patient Roles: Other (Comment) Contact Information: Mother and caregiver are primary contacts Anticipated Caregiver: caregiver Lynette Anticipated Caregiver's Contact Information: see above Ability/Limitations of Caregiver: no limitations from caregiver Caregiver Availability: 24/7 Discharge Plan Discussed with Primary Caregiver: Yes (spoke with pt's mother and caregiver ) Is Caregiver In Agreement with Plan?: Yes Does Caregiver/Family have Issues with Lodging/Transportation while Pt is in Rehab?: No  Goals/Additional Needs Patient/Family Goal for Rehab: Supervision and Mod Ind with PT/OT; NA for SLP Expected length of stay: 10-14 days Cultural Considerations: none Dietary Needs: carb modified Equipment Needs: to be determined Pt/Family Agrees to Admission and willing to participate: Yes (spoke with pt, his mother and caregiver sev. times) Program Orientation Provided & Reviewed with Pt/Caregiver Including Roles & Responsibilities: Yes   Decrease burden of Care through IP rehab admission: NA   Possible need for SNF placement upon discharge: not anticipated  Patient Condition: This patient's medical and functional status has changed since the consult dated: 08-22-14 in which the Rehabilitation Physician determined and documented that the patient's condition is appropriate for intensive rehabilitative care in an inpatient rehabilitation facility. See "History of Present Illness" (above) for medical update. Functional changes are: maximal assistance x 2 for limited transfers. Patient's medical and functional status update has been discussed  with the Rehabilitation physician and patient remains appropriate for inpatient rehabilitation. Will admit to inpatient rehab today.  Preadmission Screen Completed By: Juliann Mule, PT, 08/24/2014 11:13 AM ______________________________________________________________________  Discussed status with Dr. Wynn Banker on 08-24-14 at 1113 and received telephone approval for admission today.  Admission Coordinator: Juliann Mule, PT, time 1113/Date 08-24-14          Cosigned by: Erick Colace, MD at 08/24/2014 11:24 AM  Revision History     Date/Time User Provider Type Action   08/24/2014 11:24 AM Erick Colace, MD Physician Cosign   08/24/2014 11:23 AM Thane Edu [facility] Admission Coordinator Sign

## 2014-08-26 ENCOUNTER — Inpatient Hospital Stay (HOSPITAL_COMMUNITY): Payer: Medicaid Other | Admitting: Physical Therapy

## 2014-08-26 DIAGNOSIS — E119 Type 2 diabetes mellitus without complications: Secondary | ICD-10-CM

## 2014-08-26 LAB — GLUCOSE, CAPILLARY
GLUCOSE-CAPILLARY: 138 mg/dL — AB (ref 70–99)
Glucose-Capillary: 100 mg/dL — ABNORMAL HIGH (ref 70–99)
Glucose-Capillary: 133 mg/dL — ABNORMAL HIGH (ref 70–99)
Glucose-Capillary: 102 mg/dL — ABNORMAL HIGH (ref 70–99)

## 2014-08-26 NOTE — Progress Notes (Signed)
   Subjective/Complaints: No complaints Feels well Objective: Vital Signs: Blood pressure 173/87, pulse 85, temperature 98.7 F (37.1 C), temperature source Oral, resp. rate 18, SpO2 97 %.   nad Chest cta cv- reg rate abd- overweight  soft, nt Ext- left AKA Assessment/Plan: 1. Functional deficits secondary to Left AKA, RLE PAD with ischemia   Medical Problem List and Plan: 1. Functional deficits secondary to  Left BKA revised to AKA. RLE PAD with ischemia 2.  DVT Prophylaxis/Anticoagulation: Pharmaceutical: Lovenox 3. Pain Management: Continue  Neurontin for neuropathic symptoms. On Fentanyl with oxycodone prn for break thorough pain. Monitor for excessive sedation on narcotics.   4. Bipolar disorder/Mood: His behavior with anxiety, fluctuating mental status and confusion are similar and well known from last admission. Team to provide ego support to help patient to work thorough behavioral issues. Will have neuropsych follow up also to help with coping skills. Continue Wellbutrin and Seroquel for mood stabilization.  100 mg tid with  300 mg at bedtime?.  5. Neuropsych: This patient is not capable of making decisions on his own behalf. 6. Skin/Wound Care: Routine pressure relief measures. Monitor wound daily for healing as well as RLE for any ischemia. 7. Fluids/Electrolytes/Nutrition: Monitor I/O. Offer sugar free supplements between meals to avoid BS variations. Monitor daily weights.    8. Abnormal LFTs: Due to shocked liver. Does have history of alcohol abuse--will check ammonia levels. Will monitor for now. No GI symptoms reported.   9. Wound prophylaxis/Fevers: UCS 2/20- no growth.  Blood cultures X 2 pending.  Continue IV zosyn per VVS recommendations--Day # 6 10. HTN:  157/99-173/87 11. Tachycardia: 82-85 bpm, (resolved) 13. DM type 2:  CBG (last 3)   Recent Labs  08/25/14 1157 08/25/14 1646 08/25/14 2125  GLUCAP 86 118* 104*  fair control  LOS (Days) 2 A FACE TO FACE  EVALUATION WAS PERFORMED  Naila Elizondo HENRY 08/26/2014, 6:54 AM

## 2014-08-26 NOTE — Progress Notes (Addendum)
   [facility]  Subjective  - Doing well over all.  Currently in CIR.   Objective 173/87 85 98.7 F (37.1 C) (Oral) 18 97%  Intake/Output Summary (Last 24 hours) at 08/26/14 0949 Last data filed at 08/26/14 0800  Gross per 24 hour  Intake    840 ml  Output   1025 ml  Net   -185 ml    Palpable DP pulse right foot Great toe tip dusky no change, no ulcer Left AKA incision C/D.  ABD with SS drainage min. From edema in stump Left groin 1 cm skin maceration no depth to wound. Right groin incision healing well  Assessment/Planning: POD # 8 Axillary to femoral bypass on the right with 8 mm Hemashield graft right to left femorofemoral bypass with 8 mm Hemashield graft  CIR will f/u in office after D?C staples out at 4 weeks. Will see again Mon. if still here.  [name] GallantCOLLINS, [name] 08/26/2014 9:49 AM -- Wants to go home Exam as above Some right groin maceration 2+ right DP pulse AKA clean no drainage  [name] Brunsharles Airlie Blumenberg, MD [facility] [place] Office: [phone] Pager: [phone]  Laboratory Lab Results:  Recent Labs  08/24/14 0305 08/25/14 0451  WBC 8.1 6.5  HGB 10.0* 9.9*  HCT 30.5* 30.9*  PLT 317 337   BMET  Recent Labs  08/24/14 0305 08/25/14 0451  NA 138 140  K 3.1* 3.6  CL 102 105  CO2 28 27  GLUCOSE 132* 94  BUN 5* <5*  CREATININE 1.12 1.07  CALCIUM 7.9* 8.3*    COAG Lab Results  Component Value Date   INR 1.29 08/20/2014   INR 1.35 08/20/2014   INR 1.20 08/18/2014   No results found for: PTT

## 2014-08-26 NOTE — Progress Notes (Signed)
Physical Therapy Session Note  Patient Details  Name: [name] LauthRobert [name] MRN: [id number] Date of Birth: [birth year]  Today's Date: 08/26/2014 PT Individual Time: 1300-1350 PT Individual Time Calculation (min): 50 min  General: Missed 10 minutes due to pt refusal to participate (due to pain)  Short Term Goals: Week 1:  PT Short Term Goal 1 (Week 1): STG's = LTG's secondary to anticipated LOS.  Skilled Therapeutic Interventions/Progress Updates:    Gait Training: PT instructs pt in ambulation with bari RW x 90' req +2 assist (CGA-min A on one and w/c follow of 2nd person).  PT instructs pt in ascending/descending 1 stair (5") x 3 reps req min A of 2, progressing to min A of 1 with B rails.   Therapeutic Activity: PT instructs pt in w/c push-ups x 10 reps with B UEs. PT instructs pt in sit to stand with bari RW x 10 reps req min A, progressing to CGA as pt coordination improves. PT instructs pt in stand-pivot transfer with bari RW w/c to mat req CGA for safety.  Pt demonstrates mod I short to/from long sit transfer on mat.  Pt impulsively demonstrates mat to w/c squat-pivot transfer req SBA.    Neuromuscular Reeducation: PT instructs pt in mirror visual feedback therapy for phantom limb pain/sensation relief. Prior to using it, pt reports a complaint of L phantom foot itching - PT explains concept behind Mirror Therapy and pt agrees to it. Pt becomes agitated when PT asks him to remove his sock, and so PT allows pt to trial mirror therapy with sock on. Pt reports no phantom limb pain/sensation when trialing mirror therapy. Pt's CG present for session/education re: mirror therapy and verbalizes understanding.   Pt ended in room, demanding for PT to leave his room. PT notified RN that pt was agitated, seemingly due to begin in pain. CG confirms this, stating that pt "gets this way" when he's in pain. Continue per PT POC.    Therapy Documentation Precautions:  Precautions Precautions:  Fall Precaution Comments: L AKA Restrictions Weight Bearing Restrictions: Yes LLE Weight Bearing: Non weight bearing Other Position/Activity Restrictions: No heavy lifting/straining for 4 weeks d/t hernia; R groin wound Pain: Pain Assessment Pain Assessment: 0-10 Pain Score: 10-Worst pain ever Pain Type: Acute pain Pain Location: Leg Pain Orientation: Left Pain Descriptors / Indicators: Sharp Pain Onset: On-going Pain Intervention(s): Emotional support Multiple Pain Sites: No  See FIM for current functional status  Therapy/Group: Individual Therapy  Belinda Bringhurst M 08/26/2014, 1:04 PM

## 2014-08-26 NOTE — Progress Notes (Signed)
The patient refused his bilateral groin dressing changes last night, but allowed the RN to assess the sites.  When attempting to change the dressing this morning before reporting to the next RN, he wanted to wait until later in the morning.

## 2014-08-27 ENCOUNTER — Inpatient Hospital Stay (HOSPITAL_COMMUNITY): Payer: Self-pay | Admitting: *Deleted

## 2014-08-27 ENCOUNTER — Inpatient Hospital Stay (HOSPITAL_COMMUNITY): Payer: Self-pay

## 2014-08-27 ENCOUNTER — Inpatient Hospital Stay (HOSPITAL_COMMUNITY): Payer: Medicaid Other

## 2014-08-27 LAB — GLUCOSE, CAPILLARY
GLUCOSE-CAPILLARY: 128 mg/dL — AB (ref 70–99)
GLUCOSE-CAPILLARY: 143 mg/dL — AB (ref 70–99)
GLUCOSE-CAPILLARY: 147 mg/dL — AB (ref 70–99)
Glucose-Capillary: 105 mg/dL — ABNORMAL HIGH (ref 70–99)

## 2014-08-27 MED ORDER — HYDROCHLOROTHIAZIDE 25 MG PO TABS
25.0000 mg | ORAL_TABLET | Freq: Every day | ORAL | Status: DC
  Administered 2014-08-27 – 2014-08-31 (×5): 25 mg via ORAL
  Filled 2014-08-27 (×7): qty 1

## 2014-08-27 MED ORDER — ATENOLOL 50 MG PO TABS
50.0000 mg | ORAL_TABLET | Freq: Two times a day (BID) | ORAL | Status: DC
  Administered 2014-08-27 – 2014-08-31 (×8): 50 mg via ORAL
  Filled 2014-08-27 (×10): qty 1

## 2014-08-27 NOTE — Progress Notes (Signed)
   Subjective/Complaints: Feels well Wants to go home Objective: Vital Signs: Blood pressure 163/80, pulse 78, temperature 98.5 F (36.9 C), temperature source Oral, resp. rate 18, SpO2 100 %.   nad Chest cta cv- reg rate abd- overweight  soft, nt Ext- left AKA, r.e with 1-2 + edema, nontender Assessment/Plan: 1. Functional deficits secondary to Left AKA, RLE PAD with ischemia   Medical Problem List and Plan: 1. Functional deficits secondary to  Left BKA revised to AKA. RLE PAD with ischemia 2.  DVT Prophylaxis/Anticoagulation: Pharmaceutical: Lovenox 3. Pain Management: Continue  Neurontin for neuropathic symptoms. On Fentanyl with oxycodone prn for break thorough pain. Monitor for excessive sedation on narcotics.   4. Bipolar disorder/Mood: His behavior with anxiety, fluctuating mental status and confusion are similar and well known from last admission. Team to provide ego support to help patient to work thorough behavioral issues. Will have neuropsych follow up also to help with coping skills. Continue Wellbutrin and Seroquel for mood stabilization.  100 mg tid with  300 mg at bedtime?.  5. Neuropsych: This patient is not capable of making decisions on his own behalf. 6. Skin/Wound Care: Routine pressure relief measures. Monitor wound daily for healing as well as RLE for any ischemia. 7. Fluids/Electrolytes/Nutrition: Monitor I/O.   Pt was on hctz prior to admission- will resume (edema) 8. Abnormal LFTs: Due to shocked liver. Does have history of alcohol abuse--will check ammonia levels. Will monitor for now. No GI symptoms reported.   9. Wound prophylaxis/Fevers: UCS 2/20- no growth.  Blood cultures X 2 pending.  Continue IV zosyn per VVS recommendations--Day # 6 10. HTN:  163/80-182/92 Resume hctz and increase atenolol 11. Tachycardia: , (resolved) 13. DM type 2:  CBG (last 3)   Recent Labs  08/26/14 1612 08/26/14 2132 08/27/14 0645  GLUCAP 133* 102* 105*  fair  control 14. Lower ext edema- will resume hctz and check venous doppler LOS (Days) 3 A FACE TO FACE EVALUATION WAS PERFORMED  [name] [name] 08/27/2014, 9:19 AM

## 2014-08-27 NOTE — Progress Notes (Signed)
Occupational Therapy Session Note  Patient Details  Name: [name] [name] MRN: [id number] Date of Birth: [birth year]  Today's Date: 08/27/2014 OT Individual Time: 4098-1191 OT Individual Time Calculation (min): 45 min  Missed Time: 15 min, fatigue (psychosocial)   Short Term Goals: Week 1:  OT Short Term Goal 1 (Week 1): STG=LTG due to anticipated short LOS  Skilled Therapeutic Interventions/Progress Updates: ADL-retraining with focus on improved safety awareness, caregiver education, transfer training (toilet, bed).   Pt received seated in w/c at sink completing his grooming (oral care) with caregiver, [name] [name], present.   Pt refused bathing and declared preference for his HHA to continue to assist with all self-care versus male staff, which he stated was done prior to breakfast by his caregiver (CG).    CG admitted to assisting patient in/out of bed and on/off toilet prior to session as well as assisting pt with washing buttocks as he stood at sink to wash peri-area.   OT advised CG on ward policies relating to assisting pt's and need for confirmation of skills from therapist(s) before attempting assistance.   Pt and CG then agreed to demonstrate competence with safe toilet transfers (BSC over toilet) and transfer in/out of bed as therapist observed and instructed, as needed.    Using modified RW, pt completed transfers w/c<>bed and w/c<>BSC over toilet with good technique as CG maintained only supervision assist, providing cues for safety.   Safety plan updated this date to endorse CG assist with transfers to eliminate provocation or escalation of maladaptive behaviors, specifically resistance to treatment and/or aggitation.    Overall, pt presents with rigid behaviors and resists direction often during session but will attend to discussion with staff given extra time and simplification of instructions as needed.  Pt currently demonstrates ability to negotiate for optional meaningful activity supporting  rehab goals at this time although stating that he wanted to leave department as soon as possible.      Therapy Documentation Precautions:  Precautions Precautions: Fall Precaution Comments: L AKA Restrictions Weight Bearing Restrictions: Yes LLE Weight Bearing: Non weight bearing Other Position/Activity Restrictions: No heavy lifting/straining for 4 weeks d/t hernia; R groin wound  Pain: Pain Assessment Pain Score: Asleep   ADL: ADL ADL Comments: see FIM  See FIM for current functional status  Therapy/Group: Individual Therapy   Second session: Time: 1400-1440 Time Calculation (min):  40 min Missed Time: 20 min (phantom pain)  Pain Assessment: 7/10, LLE, rest  Skilled Therapeutic Interventions: Therapeutic activities with focus on dynamic standing balance, endurance, safety awareness.   Pt received seated in w/c complaining of mild LLE pain and requiring re-ed on desensitization and movement to manage pain.   Pt requested permission to toilet and agreed to supervised toilet transfer from w/c.   Pt able to maintain standing balance while standing over BSC to urinate with only standby assist with therapist at doorway for privacy at pt's request.   Pt propelled w/c to sink and washed his hands as cued.   Pt was then escorted to gym to complete car transfer training with introduction to use of universal weight machine for instruction on upper back and shoulder exercises (demo only, as pt was aware of MD restrictions to lifting d/t hernia).    Pt completed car transfer from/to w/c without using RW and with only supervision for safety and no cues necessary.   After car transfer pt returned to w/c and was escorted outdoors to improve sustained attention to therapist during discussion of home safey  recommendations and discharge planning  See FIM for current functional status  Therapy/Group: Individual Therapy  Kimbella Heisler 08/27/2014, 8:49 AM

## 2014-08-27 NOTE — Progress Notes (Signed)
Physical Therapy Session Note  Patient Details  Name: [name] LauthRobert [name] MRN: [id number] Date of Birth: 1966/02/02  Today's Date: 08/27/2014 PT Individual Time: 1300-1400 PT Individual Time Calculation (min): 60 min   Short Term Goals: Week 1:  PT Short Term Goal 1 (Week 1): STG's = LTG's secondary to anticipated LOS.  Skilled Therapeutic Interventions/Progress Updates:  Tx focused on functional mobility training, gait with RW, and therex for strengthening and residual limb AROM.  Pt required encouragement to continue participation at start and throughout tx, as well as multiple rest breaks, but was overall agreeable when given choices.   Pt propelled WC x150' with S and safe parts management. Pt able to manage WC in/out of bathroom without assist. Gait training x90' with RW and Min-guard A in controlled setting. Distance limited by UE fatigue.   Pt instructed in functional mobility training with Min>>S overall and periodic safety cues including squat-pivot transfers and bed mobility sit<>supine. Pt demonstrates heavy UE reliance>RLE weight shift for transfers.   Pt instructed in therex for strength and residual limb ROM including each of the following 2x10 with cues for technique:  R ankle pumps, R marching and LAQ, WC push-ups, sidelying L hip ABD, glute sets. Pt provided with hand outs for later 4 ex to continue in room between therapies.       Therapy Documentation Precautions:  Precautions Precautions: Fall Precaution Comments: L AKA Restrictions Weight Bearing Restrictions: Yes LLE Weight Bearing: Non weight bearing Other Position/Activity Restrictions: No heavy lifting/straining for 4 weeks d/t hernia; R groin wound   Pain: 8/10, but no outward signs/symptoms. Modified tx prn.    Locomotion : Ambulation Ambulation/Gait Assistance: 4: Min [employer] social research officerguard Wheelchair Mobility Distance: 150   See FIM for current functional status  Therapy/Group: Individual Therapy  Clydene Lamingole Shealynn Saulnier, PT,  DPT   Eulogio DitchKAMPEN, [name] 08/27/2014, 1:57 PM

## 2014-08-28 ENCOUNTER — Inpatient Hospital Stay (HOSPITAL_COMMUNITY): Payer: Medicaid Other

## 2014-08-28 ENCOUNTER — Telehealth: Payer: Self-pay | Admitting: Vascular Surgery

## 2014-08-28 LAB — GLUCOSE, CAPILLARY
GLUCOSE-CAPILLARY: 137 mg/dL — AB (ref 70–99)
Glucose-Capillary: 105 mg/dL — ABNORMAL HIGH (ref 70–99)
Glucose-Capillary: 154 mg/dL — ABNORMAL HIGH (ref 70–99)
Glucose-Capillary: 121 mg/dL — ABNORMAL HIGH (ref 70–99)

## 2014-08-28 MED ORDER — METHOCARBAMOL 500 MG PO TABS
250.0000 mg | ORAL_TABLET | Freq: Four times a day (QID) | ORAL | Status: DC | PRN
  Administered 2014-08-28: 250 mg via ORAL
  Filled 2014-08-28: qty 1

## 2014-08-28 NOTE — Telephone Encounter (Signed)
-----   Message from Sharee PimpleMarilyn K McChesney, RN sent at 08/28/2014  1:48 PM EST ----- Regarding: Schedule   ----- Message -----    From: Larina Earthlyodd F Early, MD    Sent: 08/28/2014   1:34 PM      To: [facility] Charge Pool  The patient will be discharged probably tomorrow from rehabilitation. I need to see him in the office 3 or 4 weeks after his left above-knee amputation for follow-up and staple removal. Had his amputation on 08/20/14

## 2014-08-28 NOTE — Progress Notes (Signed)
Occupational Therapy Session Note  Patient Details  Name: [name] [name] MRN: [id number] Date of Birth: August 12, 1965  Today's Date: 08/28/2014 OT Individual Time: 1000-1100 OT Individual Time Calculation (min): 60 min   Short Term Goals: Week 1:  OT Short Term Goal 1 (Week 1): STG=LTG due to anticipated short LOS  Skilled Therapeutic Interventions/Progress Updates: ADL-retraining with focus on shower level ADL using standard tub and tub bench and typical supervision from [place], with whom pt is quite familiar d/t pt's preference regarding personal modesty with male observer.   With OT supervision, pt completed transfer from w/c using RW and hopped to tub bench in ADL apartment, hopping approx 6 feet using RW effectively, with only standby assist.   Pt completed transfer with supervision for safety and proceeded through bathing/dressing while seated at bench with overall supervision assist for sequencing and attention to thoroughness.   HHA provides direction as needed using directive approach and a firm command tone with patient who tolerates and responds often sounding agitated but nevertheless is compliant with direction from HHA.   Pt able to transfer back to w/c at end of session again with overall supervision and intermittent vc for safety d/t chronically impaired sustained attention.   Pt enjoyed use of hand shower during this session and stated he may acquire device for his shower in the future.   OT reviewed home safety needs with HHA and patient and confirmed that all equipment needs have been met during previous admissions.   Pt returned to his room at end of session with HHA and RN student present to reassess vital signs.     Therapy Documentation Precautions:  Precautions Precautions: Fall Precaution Comments: L AKA Restrictions Weight Bearing Restrictions: Yes LLE Weight Bearing: Non weight bearing Other Position/Activity Restrictions: No heavy lifting/straining for 4 weeks d/t  hernia; R groin wound  Vital Signs: Therapy Vitals Temp: 98.9 F (37.2 C) Temp Source: Oral Pulse Rate: 82 Resp: 18 BP: 138/79 mmHg Patient Position (if appropriate): Sitting Oxygen Therapy SpO2: 95 % O2 Device: Not Delivered   Pain: Pain Assessment Pain Assessment: 0-10 Pain Score: 6    ADL: ADL ADL Comments: see FIM  See FIM for current functional status  Therapy/Group: Co-Treatment  Neal Oshea 08/28/2014, 12:08 PM
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
## 2014-08-28 NOTE — Telephone Encounter (Signed)
Left message at mothers # for patient to call back for appointment. Also tried calling cell number, and it is disconnected. dpm

## 2014-08-28 NOTE — Plan of Care (Signed)
Problem: RH PAIN MANAGEMENT Goal: RH STG PAIN MANAGED AT OR BELOW PT'S PAIN GOAL Pain at or below 5 with min assistance  Outcome: Not Progressing Pain rated 7/10

## 2014-08-28 NOTE — Progress Notes (Signed)
Physical Therapy Session Note  Patient Details  Name: [name] LauthRobert [name] MRN: [id number] Date of Birth: 1965-10-10  Today's Date: 08/28/2014 PT Individual Time: 0900-1000 PT Individual Time Calculation (min): 60 min   Session 2 Time: 1400-1450 Time Calculation (min): 50 min  Short Term Goals: Week 1:  PT Short Term Goal 1 (Week 1): STG's = LTG's secondary to anticipated LOS.  Skilled Therapeutic Interventions/Progress Updates:    Session 1: Pt received seated in w/c, agreeable to participate in therapy. Pt's caretaker [name] [name] present for entire session and helpful with redirection. Session focused on continuing education on home exercise program, curb step negotiation, functional endurance. Pt propelled w/c 150' to/from rehab gym w/ overall SBA. Instructed pt in curb step negotiation with RW w/ Min Guard A only while lifting walker from lower level to higher level. Otherwise, pt able to complete safely with close S. Pt's caretaker provided with handouts for HEP including chair push ups, glute sets, sidelying hip abduction and extension and she directed pt to complete exercises with no additional cueing needed. Pt ambulated 25' then 6175' w/ RW and close S and intermittent Min Guard A during turns. Pt's caretaker reports that she plans on providing 24 hr supervision to pt after discharge as she did after last rehab stay. Session ended in pt's room, where pt was left seated in w/c w/ caretaker present w/ all needs within reach.  Session 2: Pt received seated in w/c, agreeable to participate in therapy. Session focused on upright tolerance, w/c management, squat pivot transfers. Pt's caregiver brought in pt's w/c from home, time spent during session adjusting pt's brakes and cushion for improved fit and safety. Pt ambulated 120' w/ RW and close S/Min Guard, very fatigued at end of ambulation bout. Instructed pt in squat pivot transfers w/c<>sofa in ADL apartment to simulate home environment w/ pt completing  transfers at overall SBA level with assist to steady w/c. Instructed pt in ambulating around obstacles and over hurdle w/ RW and contact guard assist only while turning, SBA otherwise. Pt very fatigued at end of task and requesting to return to room to finish session early. Pt propelled w/c back to room w/ SBA and extra time with min cueing. Pt missed 10 minutes of scheduled PT due to fatigue.    Therapy Documentation Precautions:  Precautions Precautions: Fall Precaution Comments: L AKA Restrictions Weight Bearing Restrictions: Yes LLE Weight Bearing: Non weight bearing Other Position/Activity Restrictions: No heavy lifting/straining for 4 weeks d/t hernia; R groin wound Pain: Pain Assessment Pain Assessment: 0-10 Pain Score: 6   See FIM for current functional status  Therapy/Group: Individual Therapy  [name] [name], [name] [name]  [name] SpangleJess Sophi [name], PT, DPT 08/28/2014, 7:41 AM

## 2014-08-28 NOTE — Progress Notes (Signed)
Patient ID: [name] LauthRobert [name], male   DOB: December 03, 1965, 49 y.o.   MRN: [id number] Continues to do well in rehabilitation. Possible discharge tomorrow.  Right axillary and both groin incisions healing quite nicely. Palpable axillary and  femorofemoral bypass with palpable graft pulses.  Left above-knee amputation healing. No specific tenderness. No erythema. The staple line healing without drainage.  Stable overall. We will see back in the office at 3-4 weeks postop for staple removal.

## 2014-08-28 NOTE — Progress Notes (Signed)
49 y.o. 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  CTA revealed no flow at the common femoral artery level or iliac level on the left and patient underwent axillary to femoral bypass on right to left Fem-Fem bypass on 02/19  with attempts at limb salvage.  On 02/20, patient spiked a temp of 103 with AMS and decrease in UOP. He was started on IV fluids as well as IV antibiotics for septic shock. Dr. Arbie Cookey felt that sepsis due to ischemic L-BKA and AKA v/s hip disarticulation disused with patient. Patient underwent high L-AKA on 02/21 and has defervesced. Post op with lethargy as well as difficulty with processing due to delirium.  Fentanyl patch added to help with pain management and hypokalemia being treated. He has had issues with fluctuating bouts of lethargy with anxiety, hypotension requiring fluid bolus with banana bag as well as transfusion with 2 units PRBC for ABLA.  IVF d/c and he was treated with diuretics due to evidence of fluid overload.  He continues on IV zosyn for FUO with recommendations to continue this for now with close monitoring of wounds due to concerns of breakdown.  Subjective/Complaints: Feeling better. Up in w/c. Mobility improving. Robaxin made him sleepy last night D/C summ from vascular indicates xanax  TID but pt states he was on 0.5mg , feels very tired today  Review of Systems - Negative except tired  Objective: Vital Signs: Blood pressure 130/70, pulse 69, temperature 98.7 F (37.1 C), temperature source Oral, resp. rate 18, weight 96.8 kg (213 lb 6.5 oz), SpO2 96 %. No results found. Results for orders placed or performed during the hospital encounter of 08/24/14 (from the past 72 hour(s))  Glucose, capillary     Status: None   Collection Time: 08/25/14 11:57 AM  Result  Value Ref Range   Glucose-Capillary 86 70 - 99 mg/dL  Glucose, capillary     Status: Abnormal   Collection Time: 08/25/14  4:46 PM  Result Value Ref Range   Glucose-Capillary 118 (H) 70 - 99 mg/dL  Glucose, capillary     Status: Abnormal   Collection Time: 08/25/14  9:25 PM  Result Value Ref Range   Glucose-Capillary 104 (H) 70 - 99 mg/dL  Glucose, capillary     Status: Abnormal   Collection Time: 08/26/14  6:44 AM  Result Value Ref Range   Glucose-Capillary 100 (H) 70 - 99 mg/dL  Glucose, capillary     Status: Abnormal   Collection Time: 08/26/14 11:23 AM  Result Value Ref Range   Glucose-Capillary 138 (H) 70 - 99 mg/dL   Comment 1 Notify RN   Glucose, capillary     Status: Abnormal   Collection Time: 08/26/14  4:12 PM  Result Value Ref Range   Glucose-Capillary 133 (H) 70 - 99 mg/dL  Glucose, capillary     Status: Abnormal   Collection Time: 08/26/14  9:32 PM  Result Value Ref Range   Glucose-Capillary 102 (H) 70 - 99 mg/dL  Glucose, capillary     Status: Abnormal   Collection Time: 08/27/14  6:45 AM  Result Value Ref Range   Glucose-Capillary 105 (H) 70 - 99 mg/dL  Glucose, capillary     Status: Abnormal   Collection Time: 08/27/14 11:26 AM  Result Value Ref Range   Glucose-Capillary 128 (H)  70 - 99 mg/dL   Comment 1 Notify RN   Glucose, capillary     Status: Abnormal   Collection Time: 08/27/14  4:56 PM  Result Value Ref Range   Glucose-Capillary 143 (H) 70 - 99 mg/dL  Glucose, capillary     Status: Abnormal   Collection Time: 08/27/14  8:59 PM  Result Value Ref Range   Glucose-Capillary 147 (H) 70 - 99 mg/dL   Comment 1 Notify RN   Glucose, capillary     Status: Abnormal   Collection Time: 08/28/14  6:58 AM  Result Value Ref Range   Glucose-Capillary 105 (H) 70 - 99 mg/dL   Comment 1 Notify RN      HEENT: poor dentition Cardio: RRR and no murmur Resp: CTA B/L and unlabored GI: BS positive and NT, ND Extremity:  Edema 1+ Right pretibial Skin:   Wound  C/D/I Neuro: Lethargic, Abnormal Sensory decrease LT R foot and Abnormal Motor 5/5 in BUE, 4/5 RLE, 3- L HF Musc/Skel:  Other Left AKA GEN NAD   Assessment/Plan: 1. Functional deficits secondary to Left AKA, RLE PAD with ischemia which require 3+ hours per day of interdisciplinary therapy in a comprehensive inpatient rehab setting. Physiatrist is providing close team supervision and 24 hour management of active medical problems listed below. Physiatrist and rehab team continue to assess barriers to discharge/monitor patient progress toward functional and medical goals. FIM: FIM - Bathing Bathing: 0: Activity did not occur  FIM - Upper Body Dressing/Undressing Upper body dressing/undressing: 0: Activity did not occur (d/t IV) FIM - Lower Body Dressing/Undressing Lower body dressing/undressing: 0: Activity did not occur  FIM - Toileting Toileting steps completed by patient: Adjust clothing prior to toileting, Performs perineal hygiene, Adjust clothing after toileting Toileting Assistive Devices: Grab bar or rail for support Toileting: 5: Supervision: Safety issues/verbal cues  FIM - Diplomatic Services operational officer Devices: Grab bars Toilet Transfers: 5-To toilet/BSC: Supervision (verbal cues/safety issues), 5-From toilet/BSC: Supervision (verbal cues/safety issues)  FIM - Banker Devices: Walker, Arm rests Bed/Chair Transfer: 5: Supine > Sit: Supervision (verbal cues/safety issues), 5: Bed > Chair or W/C: Supervision (verbal cues/safety issues), 4: Chair or W/C > Bed: Min A (steadying Pt. > 75%), 5: Sit > Supine: Supervision (verbal cues/safety issues)  FIM - Locomotion: Wheelchair Distance: 150 Locomotion: Wheelchair: 5: Travels 150 ft or more: maneuvers on rugs and over door sills with supervision, cueing or coaxing FIM - Locomotion: Ambulation Locomotion: Ambulation Assistive Devices: Designer, industrial/product Ambulation/Gait  Assistance: 4: Min guard Locomotion: Ambulation: 2: Travels 50 - 149 ft with minimal assistance (Pt.>75%)  Comprehension Comprehension Mode: Auditory Comprehension: 4-Understands basic 75 - 89% of the time/requires cueing 10 - 24% of the time  Expression Expression Mode: Verbal Expression: 5-Expresses basic needs/ideas: With extra time/assistive device  Social Interaction Social Interaction: 4-Interacts appropriately 75 - 89% of the time - Needs redirection for appropriate language or to initiate interaction.  Problem Solving Problem Solving: 3-Solves basic 50 - 74% of the time/requires cueing 25 - 49% of the time  Memory Memory: 4-Recognizes or recalls 75 - 89% of the time/requires cueing 10 - 24% of the time   Medical Problem List and Plan: 1. Functional deficits secondary to  Left BKA revised to AKA. RLE PAD with ischemia 2.  DVT Prophylaxis/Anticoagulation: Pharmaceutical: Lovenox 3. Pain Management: Continue  Neurontin for neuropathic symptoms. On Fentanyl with oxycodone prn for break thorough pain. Monitor for excessive sedation on narcotics.    -  reduce robaxin to 250mg  4. Bipolar disorder/Mood: His behavior with anxiety, fluctuating mental status and confusion are similar and well known from last admission. Team to provide ego support to help patient to work thorough behavioral issues. Will have neuropsych follow up also to help with coping skills. Continue Wellbutrin and Seroquel for mood stabilization. On Adderall chronically to help with activation/energy? Discrepancy on how patient was taking meds--xanax .5mg  tid and seroquel 100 mg tid with  300 mg at bedtime?.  5. Neuropsych: This patient is not capable of making decisions on his own behalf. 6. Skin/Wound Care: Routine pressure relief measures. Monitor wound daily for healing as well as RLE for any ischemia. 7. Fluids/Electrolytes/Nutrition: Monitor I/O. Offer sugar free supplements between meals to avoid BS variations.  Monitor daily weights.    8. Abnormal LFTs: Due to shocked liver. Does have history of alcohol abuse--  -outpt follow up 9. Wound prophylaxis/Fevers: UCS 2/20- no growth.  Blood cultures X 2 pending.  Continue IV zosyn per [facility]VS recommendations 10. HTN:  Monitor BP every 8 hours. Medications resumed today--monitor for effectiveness and titrate as indicated.   11. Tachycardia: Was on atenolol bid at home. Will resume at lower dose and monitor for effectiveness.   13. DM type 2: Monitor BS with ac/hs checks. Continue lantus insulin with SSI for elevated BS. Metformin held  -sugars well controlled  LOS (Days) 4 A FACE TO FACE EVALUATION WAS PERFORMED  [name] [name] T 08/28/2014, 8:14 AM

## 2014-08-29 ENCOUNTER — Ambulatory Visit (HOSPITAL_COMMUNITY): Payer: Self-pay | Admitting: Physical Therapy

## 2014-08-29 ENCOUNTER — Inpatient Hospital Stay (HOSPITAL_COMMUNITY): Payer: Medicaid Other

## 2014-08-29 ENCOUNTER — Inpatient Hospital Stay (HOSPITAL_COMMUNITY): Payer: Medicaid Other | Admitting: Physical Therapy

## 2014-08-29 DIAGNOSIS — Z0279 Encounter for issue of other medical certificate: Secondary | ICD-10-CM

## 2014-08-29 DIAGNOSIS — R509 Fever, unspecified: Secondary | ICD-10-CM

## 2014-08-29 DIAGNOSIS — H43819 Vitreous degeneration, unspecified eye: Secondary | ICD-10-CM

## 2014-08-29 DIAGNOSIS — E1165 Type 2 diabetes mellitus with hyperglycemia: Secondary | ICD-10-CM

## 2014-08-29 LAB — URINALYSIS, ROUTINE W REFLEX MICROSCOPIC
Glucose, UA: NEGATIVE mg/dL
Hgb urine dipstick: NEGATIVE
Ketones, ur: 15 mg/dL — AB
Leukocytes, UA: NEGATIVE
Nitrite: NEGATIVE
PH: 5.5 (ref 5.0–8.0)
PROTEIN: NEGATIVE mg/dL
Specific Gravity, Urine: 1.026 (ref 1.005–1.030)
Urobilinogen, UA: 0.2 mg/dL (ref 0.0–1.0)

## 2014-08-29 LAB — CBC
HCT: 35.7 % — ABNORMAL LOW (ref 39.0–52.0)
Hemoglobin: 11.6 g/dL — ABNORMAL LOW (ref 13.0–17.0)
MCH: 28.4 pg (ref 26.0–34.0)
MCHC: 32.5 g/dL (ref 30.0–36.0)
MCV: 87.3 fL (ref 78.0–100.0)
PLATELETS: 398 10*3/uL (ref 150–400)
RBC: 4.09 MIL/uL — ABNORMAL LOW (ref 4.22–5.81)
RDW: 13.9 % (ref 11.5–15.5)
WBC: 18.1 10*3/uL — ABNORMAL HIGH (ref 4.0–10.5)

## 2014-08-29 LAB — BASIC METABOLIC PANEL
ANION GAP: 12 (ref 5–15)
BUN: 8 mg/dL (ref 6–23)
CO2: 27 mmol/L (ref 19–32)
Calcium: 9.2 mg/dL (ref 8.4–10.5)
Chloride: 97 mmol/L (ref 96–112)
Creatinine, Ser: 1.28 mg/dL (ref 0.50–1.35)
GFR calc non Af Amer: 65 mL/min — ABNORMAL LOW (ref >90)
GFR, EST AFRICAN AMERICAN: 75 mL/min — AB (ref >90)
Glucose, Bld: 211 mg/dL — ABNORMAL HIGH (ref 70–99)
POTASSIUM: 4.4 mmol/L (ref 3.5–5.1)
Sodium: 136 mmol/L (ref 135–145)

## 2014-08-29 LAB — HEPATIC FUNCTION PANEL
ALT: 61 U/L — ABNORMAL HIGH (ref 0–53)
AST: 41 U/L — AB (ref 0–37)
Albumin: 2.6 g/dL — ABNORMAL LOW (ref 3.5–5.2)
Alkaline Phosphatase: 87 U/L (ref 39–117)
BILIRUBIN DIRECT: 0.1 mg/dL (ref 0.0–0.5)
Indirect Bilirubin: 0.4 mg/dL (ref 0.3–0.9)
TOTAL PROTEIN: 7.3 g/dL (ref 6.0–8.3)
Total Bilirubin: 0.5 mg/dL (ref 0.3–1.2)

## 2014-08-29 LAB — GLUCOSE, CAPILLARY
GLUCOSE-CAPILLARY: 140 mg/dL — AB (ref 70–99)
Glucose-Capillary: 93 mg/dL (ref 70–99)
Glucose-Capillary: 140 mg/dL — ABNORMAL HIGH (ref 70–99)
Glucose-Capillary: 140 mg/dL — ABNORMAL HIGH (ref 70–99)

## 2014-08-29 MED ORDER — WHITE PETROLATUM GEL
Status: AC
Start: 2014-08-29 — End: 2014-08-29
  Administered 2014-08-29: 15:00:00
  Filled 2014-08-29: qty 1

## 2014-08-29 MED ORDER — VANCOMYCIN HCL 10 G IV SOLR
1250.0000 mg | Freq: Two times a day (BID) | INTRAVENOUS | Status: DC
  Administered 2014-08-29 – 2014-08-31 (×4): 1250 mg via INTRAVENOUS
  Filled 2014-08-29 (×6): qty 1250

## 2014-08-29 MED ORDER — ENSURE PUDDING PO PUDG
1.0000 | Freq: Every day | ORAL | Status: DC
  Administered 2014-08-29 – 2014-08-31 (×3): 1 via ORAL

## 2014-08-29 MED ORDER — PIPERACILLIN-TAZOBACTAM 3.375 G IVPB
3.3750 g | Freq: Three times a day (TID) | INTRAVENOUS | Status: DC
  Administered 2014-08-30 – 2014-08-31 (×5): 3.375 g via INTRAVENOUS
  Filled 2014-08-29 (×9): qty 50

## 2014-08-29 MED ORDER — HYDROMORPHONE HCL 2 MG PO TABS
2.0000 mg | ORAL_TABLET | ORAL | Status: DC | PRN
  Administered 2014-08-29: 2 mg via ORAL
  Filled 2014-08-29: qty 1

## 2014-08-29 NOTE — Progress Notes (Signed)
Physical Therapy Session Note  Patient Details  Name: [name] LauthRobert [name] MRN: [id number] Date of Birth: [birth year]  Today's Date: 08/29/2014 PT Individual Time: 830-930 Treatment Time: 60 min   Short Term Goals: Week 1:  PT Short Term Goal 1 (Week 1): STG's = LTG's secondary to anticipated LOS.  Skilled Therapeutic Interventions/Progress Updates:    Therapeutic Interventions Treatment Session 1: Gait Training: PT instructs pt in ambulation with RW x 75' req SBA and encouragement for this distance - one LOB self corrected.  PT instructs pt in ascending/descending a single step backwards with RW req SBA for safety x 2 reps.  CG requests we trial 3 stairs in a row to practice how he would enter his mother's home, but our practice stairs have rails on both sides and are too narrow for pt and CG to stand side by side with his arm over CG shoulder (as they did premorbidly), and so this was not attempted due to unsafe set up.   Therapeutic Activity: PT instructs pt in car transfer from w/c to low simulated car with RW req SBA for safety.  Pt demonstrates mod I mat mobility supine to/from sit via long sit. PT educates pt that it will be better for his abdominal hernia if he completes this transfer via side lie technique, but pt either does not cognitively understand this or is pretending not to understand it so he will not have to complete it.   W/C management: Pt demonstrates mod I w/c propulsion in a controlled environment x 200' on level surface.  Pt participates well during AM session, but PM session canceled due to pt medically unstable from infection present and intact leg possibly having an infection. Continue per PT POC as pt stable.   Therapy Documentation Precautions:  Precautions Precautions: Fall Precaution Comments: L AKA Restrictions Weight Bearing Restrictions: Yes LLE Weight Bearing: Non weight bearing Other Position/Activity Restrictions: No heavy lifting/straining for 4 weeks  d/t hernia; R groin wound General: PT Amount of Missed Time (min): 60 Minutes PT Missed Treatment Reason: MD hold (Comment) (MD hold due to infection - possibly in intact LE) Pain: Pain Assessment Pain Assessment: 0-10 Pain Score: 7  Pain Type: Acute pain Pain Location: Leg Pain Orientation: Right Pain Descriptors / Indicators: Aching;Crying;Grimacing;Jabbing Pain Frequency: Constant Pain Onset: On-going Patients Stated Pain Goal: 2 Pain Intervention(s): Medication (See eMAR)  See FIM for current functional status  Therapy/Group: Individual Therapy  Esau Fridman M 08/29/2014, 4:32 PM

## 2014-08-29 NOTE — Progress Notes (Addendum)
Physical Therapy Discharge Summary  Patient Details  Name: [name] [name] MRN: [id number] Date of Birth: 05-Sep-1965  Today's Date: 08/31/2014 PT Individual Time: 1030-1115 PT Individual Time Calculation (min): 45 min  General: Pt missed 15 minutes PT due to refusal to participate due to mental health behaviors.   Patient has met 9 of 11 long term goals due to improved activity tolerance, improved balance, increased strength, decreased pain, ability to compensate for deficits and improved coordination.  Patient to discharge at a wheelchair level Modified Independent.   Patient's care partner is independent to provide the necessary physical assistance at discharge.  Equipment: pressure relieving w/c cushion  Reasons for discharge: discharge from hospital  Patient/family agrees with progress made and goals achieved: Yes  Therapeutic Interventions: Gait Training: PT instructs pt in ambulation with RW x 50' req SBA and encouragement for this distance.   Therapeutic Activity: PT instructs pt in car transfer req SBA with RW and verbal cues for encouragement/safety.   Community Integration: PT instructs pt in community w/c propulsion x 200' mod I.   Pt is safe to d/c home with intermittent CG assist. No further PT indicated at this time.    PT Discharge Precautions/Restrictions Precautions Precautions: Fall Precaution Comments: L AKA Restrictions Weight Bearing Restrictions: Yes LLE Weight Bearing: Non weight bearing Other Position/Activity Restrictions: No heavy lifting/straining for 4 weeks d/t hernia; R groin wound Pain Pain Assessment Pain Assessment: 0-10 Pain Score: 10-Worst pain ever Pain Type: Surgical pain Pain Location: Leg Pain Orientation: Left Pain Descriptors / Indicators: Pressure;Heaviness Pain Onset: On-going Pain Intervention(s): Emotional support Multiple Pain Sites: No  Treatment Session 2: Vision/Perception  Perception Comments: wfl  Cognition Overall  Cognitive Status: History of cognitive impairments - at baseline Arousal/Alertness: Awake/alert Orientation Level: Oriented to person;Disoriented to time Attention: Focused Focused Attention: Impaired Focused Attention Impairment: Verbal basic Sustained Attention: Impaired Sustained Attention Impairment: Verbal basic (due to mental health deficits) Memory: Appears intact Awareness: Impaired Awareness Impairment: Emergent impairment Problem Solving: Impaired Problem Solving Impairment: Verbal complex;Functional basic Safety/Judgment: Impaired Sensation Sensation Light Touch: Impaired Detail Light Touch Impaired Details: Impaired RUE Stereognosis: Not tested Hot/Cold: Not tested Additional Comments: pt report of numbness in R arm, but pt is unreliable historian Coordination Gross Motor Movements are Fluid and Coordinated: Yes Fine Motor Movements are Fluid and Coordinated: Not tested Motor  Motor Motor: Abnormal postural alignment and control Motor - Discharge Observations: chronic slouched sitting  Mobility Bed Mobility Bed Mobility: Supine to Sit;Sit to Supine Supine to Sit: 6: Modified independent (Device/Increase time) Sit to Supine: 6: Modified independent (Device/Increase time) Transfers Transfers: Yes Sit to Stand: 5: Supervision Sit to Stand Details: Verbal cues for safe use of DME/AE Stand to Sit: 5: Supervision Stand to Sit Details (indicate cue type and reason): Verbal cues for safe use of DME/AE Stand Pivot Transfers: 5: Supervision Stand Pivot Transfer Details: Verbal cues for safe use of DME/AE Locomotion  Ambulation Ambulation: Yes Ambulation/Gait Assistance: 5: Supervision Ambulation Distance (Feet): 50 Feet Assistive device: Rolling walker Ambulation/Gait Assistance Details: Verbal cues for precautions/safety Gait Gait: Yes Gait Pattern: Impaired Gait Pattern: Trunk flexed (step-hop pattern) Gait velocity: decreased Stairs / Additional  Locomotion Stairs: Yes Stairs Assistance: 5: Supervision Stairs Assistance Details: Verbal cues for safe use of DME/AE Stair Management Technique: Backwards;With walker Number of Stairs: 1 Height of Stairs: 5 Wheelchair Mobility Wheelchair Mobility: Yes Wheelchair Assistance: 6: Modified independent (Device/Increase time) Environmental health practitioner: Both upper extremities Wheelchair Parts Management: Supervision/cueing Distance: 200  Trunk/Postural Assessment  Cervical Assessment Cervical Assessment: Within Functional Limits Thoracic Assessment Thoracic Assessment: Within Functional Limits Lumbar Assessment Lumbar Assessment: Within Functional Limits Postural Control Postural Control: Deficits on evaluation Postural Limitations: chronic slouched sitting posture  Balance Balance Balance Assessed: Yes Static Sitting Balance Static Sitting - Balance Support: Bilateral upper extremity supported Static Sitting - Level of Assistance: 6: Modified independent (Device/Increase time) Dynamic Sitting Balance Dynamic Sitting - Balance Support: Bilateral upper extremity supported Dynamic Sitting - Level of Assistance: 6: Modified independent (Device/Increase time) Static Standing Balance Static Standing - Balance Support: Bilateral upper extremity supported;During functional activity Static Standing - Level of Assistance: 6: Modified independent (Device/Increase time) Dynamic Standing Balance Dynamic Standing - Balance Support: Bilateral upper extremity supported Dynamic Standing - Level of Assistance: 5: Stand by assistance Extremity Assessment  RUE Assessment RUE Assessment: Within Functional Limits LUE Assessment LUE Assessment: Within Functional Limits RLE Assessment RLE Assessment: Within Functional Limits LLE Assessment LLE Assessment: Exceptions to [facility] LLE Strength LLE Overall Strength: Deficits;Due to pain LLE Overall Strength Comments: Not formally tested due to pain, but >=  3+/5  See FIM for current functional status  [name] [name] 08/31/2014, 11:22 AM

## 2014-08-29 NOTE — Patient Care Conference (Signed)
Inpatient RehabilitationTeam Conference and Plan of Care Update Date: 08/31/2014   Time: 2:00 PM    Patient Name: [name] LauthRobert [name]      Medical Record Number: [id number]  Date of Birth: 02-Jan-1966 Sex: Male         Room/Bed: [id number]-01 Payor Info: Payor: MEDICAID [place] / Plan: MEDICAID [place] ACCESS / Product Type: *No Product type* /    Admitting Diagnosis: L AKA revision of L BKA CONFUSION    Admit Date/Time:  08/24/2014  6:14 PM Admission Comments: No comment available   Primary Diagnosis:  Status post above knee amputation of left lower extremity Principal Problem: Status post above knee amputation of left lower extremity  Patient Active Problem List   Diagnosis Date Noted  . Leucocytosis 08/31/2014  . Postoperative anemia due to acute blood loss 08/31/2014  . Edema leg 08/31/2014  . Peripheral vascular disease in diabetes mellitus 08/24/2014  . Hypokalemia   . Diabetes type 2, uncontrolled   . Status post above knee amputation of left lower extremity   . S/P femoropopliteal bypass surgery   . Delirium   . Alcohol abuse   . Tobacco abuse   . Benign essential hypertension antepartum   . Anxiety state   . Fever   . Pulmonary edema   . Surgery, other elective   . Low urine output 08/19/2014  . Fever of undetermined origin 08/19/2014  . Ischemia of site of left below knee amputation 08/16/2014  . Visit for wound check-Left ABK 10/20/2013  . Wound dehiscence 10/13/2013  . Aftercare following surgery of the circulatory system, NEC 09/29/2013  . Confusion, postoperative 09/16/2013  . Unilateral complete BKA 09/09/2013  . Ischemic leg 08/30/2013  . Incisional hernia without mention of obstruction or gangrene 04/06/2013  . PAD (peripheral artery disease) 03/30/2013  . Peripheral vascular disease, unspecified 01/22/2012  . Diabetes mellitus 01/16/2012  . PVD (peripheral vascular disease) 01/13/2012  . Uncontrolled hypertension   . GERD (gastroesophageal reflux disease)   .  Nicotine addiction   . Anxiety   . Occlusion and stenosis of carotid artery without mention of cerebral infarction 10/02/2011  . Atherosclerosis of native arteries of the extremities with intermittent claudication 10/02/2011  . Claudication 09/04/2011  . Stroke 08/11/2011    Expected Discharge Date: Expected Discharge Date:  (TBD)  Team Members Present: Physician leading conference: Dr. Faith RogueZachary Swartz Social Worker Present: Amada JupiterLucy Kem Parcher, LCSW Nurse Present: Carlean PurlMaryann Barbour, RN PT Present: Edman CircleAudra Hall, PT OT Present: Donzetta KohutFrank Barthold, OT;Other (comment) Johnsie Cancel(Amy Lewis, OT) SLP Present: Feliberto Gottronourtney Payne, SLP     Current Status/Progress Goal Weekly Team Focus  Medical   left AK revision. now with new onset fever. wounds look good. RLE source?  stabilize medically for dc  wound care, fever dx   Bowel/Bladder   continent of bowel  and bladder   remain continent of bowel and bladder  individualozed toileting plan   Swallow/Nutrition/ Hydration             ADL's   Overall Supervision/Setup BADL and transfers  Supervision, intermittent Mod I (toilet transfer, bathing, dressing)  Pain management, functional transfers, improved awareness (safety, emergent)   Mobility   Overall supervision/SBA for upright mobility  mod (I) for bed mobility and sitting activities, supervision for upright mobility  sit<>stands, gait distance, curb step negotiation   Communication             Safety/Cognition/ Behavioral Observations            Pain   patient  complains of pain of 7/10 at the AKA site.  pain less than or equal to 3 on a scale of 0-10  assess pain q4h and prn   Skin   no s/s of skin injury/breakdown other than surgical site  no further skin injury/breakdown  assess skin q shift and prn    Rehab Goals Patient on target to meet rehab goals: No Rehab Goals Revised: downgrading any mod i goals to superivison *See Care Plan and progress notes for long and short-term goals.  Barriers to  Discharge: medical issues    Possible Resolutions to Barriers:  rx medical, follow up surgery    Discharge Planning/Teaching Needs:  home with caregiver, Haywood Lasso, to provide 24/7 assist  ongoing   Team Discussion:  Fever this am but xray ok.  Complaining of right leg pain.  Currently at supervision level (goal).  Medical issues will hold patient here the next couple of days.  Await vascular consult and change schedule to qd  Revisions to Treatment Plan:  Decrease tx to qd only;  Downgrading of original mod i goals to supervision   Continued Need for Acute Rehabilitation Level of Care: The patient requires daily medical management by a physician with specialized training in physical medicine and rehabilitation for the following conditions: Daily direction of a multidisciplinary physical rehabilitation program to ensure safe treatment while eliciting the highest outcome that is of practical value to the patient.: Yes Daily medical management of patient stability for increased activity during participation in an intensive rehabilitation regime.: Yes Daily analysis of laboratory values and/or radiology reports with any subsequent need for medication adjustment of medical intervention for : Post surgical problems;Other  Caysie Minnifield 08/31/2014, 10:11 AM

## 2014-08-29 NOTE — Progress Notes (Signed)
Pt with 103 temp this morning. Increasing pain in the RLE, patient despondent. WBC's up to 18k  Area of ischemia on 1st toe unchanged. Left leg with edema and warm. Had difficulty palpating pulses due to swelling.  See no outward signs of infection. Incision sites are clean.  Has some generalized tenderness around left inguinal wound.   Likely experiencing claudication/ischemia pain RLE.   Will add dilaudid for pain control. Check dopplers for pulse right foot. Have contacted vascular surgery.

## 2014-08-29 NOTE — Progress Notes (Addendum)
  Progress Note    08/29/2014 1:56 PM   Subjective:  Pt crying with pain-states it is in his left leg.  The pain has come on all of a sudden this afternoon  Tm 103.1 now 100.6 HR 90's-100's 90's-110's systolic 95% RA  Filed Vitals:   08/29/14 1206  BP: 107/72  Pulse: 94  Temp: 100.6 F (38.1 C)  Resp: 20    Physical Exam: Incisions:  Right upper chest incision is healing nicely.  Bilateral groins healing nicely.  Left groin skin separation has healed. Extremities:  + doppler signals right PT/DP as well as fem fem graft. Right great toe continues to demarcate  Abdomen:  Soft NT/ND with umbilical hernia present and unchanged.  CBC    Component Value Date/Time   WBC 18.1* 08/29/2014 0848   RBC 4.09* 08/29/2014 0848   HGB 11.6* 08/29/2014 0848   HCT 35.7* 08/29/2014 0848   PLT 398 08/29/2014 0848   MCV 87.3 08/29/2014 0848   MCH 28.4 08/29/2014 0848   MCHC 32.5 08/29/2014 0848   RDW 13.9 08/29/2014 0848   LYMPHSABS 1.4 08/25/2014 0451   MONOABS 0.5 08/25/2014 0451   EOSABS 0.2 08/25/2014 0451   BASOSABS 0.0 08/25/2014 0451    BMET    Component Value Date/Time   NA 136 08/29/2014 0848   K 4.4 08/29/2014 0848   CL 97 08/29/2014 0848   CO2 27 08/29/2014 0848   GLUCOSE 211* 08/29/2014 0848   BUN 8 08/29/2014 0848   CREATININE 1.28 08/29/2014 0848   CALCIUM 9.2 08/29/2014 0848   GFRNONAA 65* 08/29/2014 0848   GFRAA 75* 08/29/2014 0848    INR    Component Value Date/Time   INR 1.29 08/20/2014 1625     Intake/Output Summary (Last 24 hours) at 08/29/14 1356 Last data filed at 08/28/14 1930  Gross per 24 hour  Intake    240 ml  Output   1050 ml  Net   -810 ml     Assessment:  49 y.o. male is s/p:  Axillary to femoral bypass on the right with 8 mm Hemashield graft right to left femorofemoral bypass with 8 mm Hemashield graft 11 Days Post-Op And Left AKA   9 Days Post-Op    Plan: -pt with increased fever this morning-blood cultures have been  drawn this am -will restart Vanc/Zosyn for prophylaxis for fever and leukocytosis  -wounds are healing nicely and no evidence of infection -stump healing with staples in tact -u/a was negative & CXR okay -continue close observation of wounds -continue pain control Will order CK    Doreatha MassedSamantha Rhyne, PA-C [facility] [phone] 08/29/2014 1:56 PM    Patient seen and examined.  C/o pain in his L AKA stump which began earlier today.  Stump has no drainage.  Incision is intact.  The medial side is painful to the touch.  He is febrile with increased WBC  Given his recent history, there is concern for ischemic changes to his L AKA stump which is causing his fevers and pain.  Fever w/u to date shows negative UA and CXR.  No clinical signs currently that the stump is infected.  Given how high his amputation is, I suspect if he goes back to the OR for this, he will need a hip disarticulation.  Wlll continue to monitor stump to see if there is any clinical change before making a decision to return to the OR  [employer] Dereck Agerton

## 2014-08-29 NOTE — Progress Notes (Addendum)
  RD consulted for nutrition education regarding diabetes.   Lab Results  Component Value Date   HGBA1C 10.5* 08/16/2014    Pt was not available during time of visit, however pt's caregiver was present. RD provided "Plate Method Eating" to caregiver. She reports she is responsible for providing food for the pt. She reports she is familiar and knowledgeable about a diabetic diet. Briefly discussed different food groups and their effects on blood sugar, emphasizing carbohydrate-containing foods from "Type 2 Diabetes Nutrition Therapy" handout given from last week. Provided list of carbohydrates and recommended serving sizes of common foods. Recommended 4-5 servings of carbohydrates at each meal. Provided examples of ways to balance meals/snacks and encouraged intake of high-fiber, whole grain complex carbohydrates. Discussed diabetic friendly drink options.Teach back method used.  Expect good compliance.  Marijean NiemannStephanie La, MS, RD, LDN Pager # [phone] After hours/ weekend pager # [phone]

## 2014-08-29 NOTE — Progress Notes (Signed)
49 y.o. 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  CTA revealed no flow at the common femoral artery level or iliac level on the left and patient underwent axillary to femoral bypass on right to left Fem-Fem bypass on 02/19  with attempts at limb salvage.  On 02/20, patient spiked a temp of 103 with AMS and decrease in UOP. He was started on IV fluids as well as IV antibiotics for septic shock. Dr. Arbie Cookey felt that sepsis due to ischemic L-BKA and AKA v/s hip disarticulation disused with patient. Patient underwent high L-AKA on 02/21 and has defervesced. Post op with lethargy as well as difficulty with processing due to delirium.  Fentanyl patch added to help with pain management and hypokalemia being treated. He has had issues with fluctuating bouts of lethargy with anxiety, hypotension requiring fluid bolus with banana bag as well as transfusion with 2 units PRBC for ABLA.  IVF d/c and he was treated with diuretics due to evidence of fluid overload.  He continues on IV zosyn for FUO with recommendations to continue this for now with close monitoring of wounds due to concerns of breakdown.  Subjective/Complaints: Feels feverish. Sweating in w/c this morning.  Still didn't tolerate the low dose robaxin  Review of Systems - Negative except tired  Objective: Vital Signs: Blood pressure 133/95, pulse 108, temperature 103.1 F (39.5 C), temperature source Oral, resp. rate 19, weight 94.8 kg (208 lb 15.9 oz), SpO2 100 %. No results found. Results for orders placed or performed during the hospital encounter of 08/24/14 (from the past 72 hour(s))  Glucose, capillary     Status: Abnormal   Collection Time: 08/26/14 11:23 AM  Result Value Ref Range   Glucose-Capillary 138 (H) 70 - 99 mg/dL   Comment 1 Notify RN    Glucose, capillary     Status: Abnormal   Collection Time: 08/26/14  4:12 PM  Result Value Ref Range   Glucose-Capillary 133 (H) 70 - 99 mg/dL  Glucose, capillary     Status: Abnormal   Collection Time: 08/26/14  9:32 PM  Result Value Ref Range   Glucose-Capillary 102 (H) 70 - 99 mg/dL  Glucose, capillary     Status: Abnormal   Collection Time: 08/27/14  6:45 AM  Result Value Ref Range   Glucose-Capillary 105 (H) 70 - 99 mg/dL  Glucose, capillary     Status: Abnormal   Collection Time: 08/27/14 11:26 AM  Result Value Ref Range   Glucose-Capillary 128 (H) 70 - 99 mg/dL   Comment 1 Notify RN   Glucose, capillary     Status: Abnormal   Collection Time: 08/27/14  4:56 PM  Result Value Ref Range   Glucose-Capillary 143 (H) 70 - 99 mg/dL  Glucose, capillary     Status: Abnormal   Collection Time: 08/27/14  8:59 PM  Result Value Ref Range   Glucose-Capillary 147 (H) 70 - 99 mg/dL   Comment 1 Notify RN   Glucose, capillary     Status: Abnormal   Collection Time: 08/28/14  6:58 AM  Result Value Ref Range   Glucose-Capillary 105 (H) 70 - 99 mg/dL   Comment 1 Notify RN   Glucose, capillary     Status: Abnormal   Collection Time: 08/28/14 11:32 AM  Result Value Ref Range  Glucose-Capillary 154 (H) 70 - 99 mg/dL  Glucose, capillary     Status: Abnormal   Collection Time: 08/28/14  4:56 PM  Result Value Ref Range   Glucose-Capillary 137 (H) 70 - 99 mg/dL  Glucose, capillary     Status: Abnormal   Collection Time: 08/28/14  9:13 PM  Result Value Ref Range   Glucose-Capillary 121 (H) 70 - 99 mg/dL   Comment 1 Notify RN   Glucose, capillary     Status: Abnormal   Collection Time: 08/29/14  6:45 AM  Result Value Ref Range   Glucose-Capillary 140 (H) 70 - 99 mg/dL   Comment 1 Notify RN      HEENT: poor dentition Cardio: RRR and no murmur Resp: CTA B/L and unlabored. No rales GI: BS positive and NT, ND Extremity:  Edema 1+ Right pretibial Skin:   WoundsC/D/I- Neuro: Lethargic,  Abnormal Sensory decrease LT R foot and Abnormal Motor 5/5 in BUE, 4/5 RLE, 3- L HF Musc/Skel:  Other Left AKA GEN-sweating profusely   Assessment/Plan: 1. Functional deficits secondary to Left AKA, RLE PAD with ischemia which require 3+ hours per day of interdisciplinary therapy in a comprehensive inpatient rehab setting. Physiatrist is providing close team supervision and 24 hour management of active medical problems listed below. Physiatrist and rehab team continue to assess barriers to discharge/monitor patient progress toward functional and medical goals. FIM: FIM - Bathing Bathing Steps Patient Completed: Chest, Right Arm, Left Arm, Abdomen, Front perineal area, Buttocks, Right upper leg, Left upper leg, Right lower leg (including foot) Bathing: 5: Supervision: Safety issues/verbal cues  FIM - Upper Body Dressing/Undressing Upper body dressing/undressing steps patient completed: Thread/unthread right sleeve of pullover shirt/dresss, Thread/unthread left sleeve of pullover shirt/dress, Put head through opening of pull over shirt/dress, Pull shirt over trunk Upper body dressing/undressing: 5: Supervision: Safety issues/verbal cues FIM - Lower Body Dressing/Undressing Lower body dressing/undressing steps patient completed: Thread/unthread right underwear leg, Thread/unthread left underwear leg, Pull underwear up/down, Thread/unthread right pants leg, Thread/unthread left pants leg, Pull pants up/down, Fasten/unfasten pants, Don/Doff right shoe Lower body dressing/undressing: 5: Set-up assist to: Obtain clothing  FIM - Toileting Toileting steps completed by patient: Adjust clothing prior to toileting Toileting Assistive Devices: Grab bar or rail for support Toileting: 6: More than reasonable amount of time  FIM - Diplomatic Services operational officer Devices: Grab bars Toilet Transfers: 5-To toilet/BSC: Supervision (verbal cues/safety issues), 5-From toilet/BSC: Supervision  (verbal cues/safety issues)  FIM - Banker Devices: Environmental consultant, Arm rests Bed/Chair Transfer: 5: Supine > Sit: Supervision (verbal cues/safety issues), 5: Sit > Supine: Supervision (verbal cues/safety issues), 4: Bed > Chair or W/C: Min A (steadying Pt. > 75%), 4: Chair or W/C > Bed: Min A (steadying Pt. > 75%)  FIM - Locomotion: Wheelchair Distance: 150 Locomotion: Wheelchair: 5: Travels 150 ft or more: maneuvers on rugs and over door sills with supervision, cueing or coaxing FIM - Locomotion: Ambulation Locomotion: Ambulation Assistive Devices: Designer, industrial/product Ambulation/Gait Assistance: 4: Min guard, 5: Supervision Locomotion: Ambulation: 2: Travels 50 - 149 ft with minimal assistance (Pt.>75%)  Comprehension Comprehension Mode: Auditory Comprehension: 4-Understands basic 75 - 89% of the time/requires cueing 10 - 24% of the time  Expression Expression Mode: Verbal Expression: 5-Expresses basic needs/ideas: With extra time/assistive device  Social Interaction Social Interaction: 4-Interacts appropriately 75 - 89% of the time - Needs redirection for appropriate language or to initiate interaction.  Problem Solving Problem Solving: 3-Solves basic 50 - 74% of  the time/requires cueing 25 - 49% of the time  Memory Memory: 4-Recognizes or recalls 75 - 89% of the time/requires cueing 10 - 24% of the time   Medical Problem List and Plan: 1. Functional deficits secondary to  Left BKA revised to AKA. RLE PAD with ischemia 2.  DVT Prophylaxis/Anticoagulation: Pharmaceutical: Lovenox 3. Pain Management: Continue  Neurontin for neuropathic symptoms. On Fentanyl with oxycodone prn for break thorough pain. Monitor for excessive sedation on narcotics.    -reduce robaxin to 250mg  4. Bipolar disorder/Mood: His behavior with anxiety, fluctuating mental status and confusion are similar and well known from last admission. Team to provide ego support to help  patient to work thorough behavioral issues. Will have neuropsych follow up also to help with coping skills. Continue Wellbutrin and Seroquel for mood stabilization. On Adderall chronically to help with activation/energy? Discrepancy on how patient was taking meds--xanax .5mg  tid and seroquel 100 mg tid with  300 mg at bedtime?.  5. Neuropsych: This patient is not capable of making decisions on his own behalf. 6. Skin/Wound Care: Routine pressure relief measures. Monitor wound daily for healing as well as RLE for any ischemia. 7. Fluids/Electrolytes/Nutrition: Monitor I/O. Offer sugar free supplements between meals to avoid BS variations. Monitor daily weights.    8. Abnormal LFTs: Due to shocked liver. Does have history of alcohol abuse--  -outpt follow up 9. Wound prophylaxis/Fevers: UCS 2/20- no growth.  Blood cultures X 2 pending.  Continue IV zosyn per [facility] recommendations 10. HTN:  Monitor BP every 8 hours. Medications resumed today--monitor for effectiveness and titrate as indicated.   11. Tachycardia: Was on atenolol bid at home. Will resume at lower dose and monitor for effectiveness.   13. DM type 2: Monitor BS with ac/hs checks. Continue lantus insulin with SSI for elevated BS. Metformin held  -sugars well controlled 14. Fever---103.1 this am  -blood cultures x2  -check urine   -chest xray  LOS (Days) 5 A FACE TO FACE EVALUATION WAS PERFORMED  SWARTZ,ZACHARY T 08/29/2014, 7:45 AM

## 2014-08-29 NOTE — Plan of Care (Signed)
08/29/2014  Pt has been changed to Q.D. Or once daily therapies due to medical issues.  Awaiting medical clearance for D/C.  Pt progressing well towards therapy goals.
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
## 2014-08-29 NOTE — Progress Notes (Signed)
ANTIBIOTIC CONSULT NOTE - INITIAL  Pharmacy Consult for vancomycin and zosyn Indication: infected graft / fever  No Known Allergies  Patient Measurements: Weight: 208 lb 15.9 oz (94.8 kg) Adjusted Body Weight:   Vital Signs: Temp: 100.6 F (38.1 C) (03/01 1206) Temp Source: Oral (03/01 1206) BP: 107/72 mmHg (03/01 1206) Pulse Rate: 94 (03/01 1206) Intake/Output from previous day: 02/29 0701 - 03/01 0700 In: 600 [P.O.:600] Out: 1275 [Urine:1275] Intake/Output from this shift:    Labs:  Recent Labs  08/29/14 0848  WBC 18.1*  HGB 11.6*  PLT 398  CREATININE 1.28   Estimated Creatinine Clearance: 82.1 mL/min (by C-G formula based on Cr of 1.28). No results for input(s): VANCOTROUGH, VANCOPEAK, VANCORANDOM, GENTTROUGH, GENTPEAK, GENTRANDOM, TOBRATROUGH, TOBRAPEAK, TOBRARND, AMIKACINPEAK, AMIKACINTROU, AMIKACIN in the last 72 hours.   Microbiology: Recent Results (from the past 720 hour(s))  MRSA PCR Screening     Status: None   Collection Time: 08/16/14  8:17 PM  Result Value Ref Range Status   MRSA by PCR NEGATIVE NEGATIVE Final    Comment:        The GeneXpert MRSA Assay (FDA approved for NASAL specimens only), is one component of a comprehensive MRSA colonization surveillance program. It is not intended to diagnose MRSA infection nor to guide or monitor treatment for MRSA infections.   Culture, blood (routine x 2)     Status: None   Collection Time: 08/19/14  9:20 AM  Result Value Ref Range Status   Specimen Description BLOOD RIGHT HAND  Final   Special Requests BOTTLES DRAWN AEROBIC ONLY 3CC  Final   Culture   Final    NO GROWTH 5 DAYS Performed at [facility]    Report Status 08/25/2014 FINAL  Final  Culture, blood (routine x 2)     Status: None   Collection Time: 08/19/14  9:25 AM  Result Value Ref Range Status   Specimen Description BLOOD RIGHT ARM  Final   Special Requests BOTTLES DRAWN AEROBIC ONLY 10CC  Final   Culture   Final    NO  GROWTH 5 DAYS Note: Culture results may be compromised due to an excessive volume of blood received in culture bottles. Performed at [facility]    Report Status 08/25/2014 FINAL  Final  Urine culture     Status: None   Collection Time: 08/19/14  9:50 AM  Result Value Ref Range Status   Specimen Description URINE, CATHETERIZED  Final   Special Requests NONE  Final   Colony Count NO GROWTH Performed at [facility]   Final   Culture NO GROWTH Performed at [facility]   Final   Report Status 08/20/2014 FINAL  Final    Medical History: Past Medical History  Diagnosis Date  . GERD (gastroesophageal reflux disease)   . Nicotine addiction   . ETOH abuse   . Anxiety   . Hypertension     no pcp     saw peter nisham once  . Peripheral vascular disease   . Hyperlipidemia   . Collagen vascular disease   . Type II diabetes mellitus   . History of blood transfusion 08/2013    "maybe when he had his amputation"  . 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 Chronic lower back pain   . Depression   . Bipolar disorder     Medications:  Scheduled:  . ALPRAZolam  0.5 mg Oral TID  . amphetamine-dextroamphetamine  15 mg Oral Daily  . aspirin  325 mg Oral Daily  . atenolol  50 mg Oral BID  . atorvastatin  40 mg Oral q morning - 10a  . buPROPion  100 mg Oral BID  . enoxaparin (LOVENOX) injection  40 mg Subcutaneous Q24H  . feeding supplement (ENSURE)  1 Container Oral Daily  . feeding supplement (GLUCERNA SHAKE)  237 mL Oral Q1500  . fentaNYL  12.5 mcg Transdermal Q72H  . folic acid  1 mg Oral Daily  . gabapentin  300 mg Oral TID  . hydrochlorothiazide  25 mg Oral Daily  . insulin aspart  0-15 Units Subcutaneous TID WC  . insulin aspart  0-5 Units Subcutaneous QHS  . insulin glargine  40 Units Subcutaneous QHS  . multivitamin with minerals  1 tablet Oral Daily  . nicotine  21 mg Transdermal  Daily  . pantoprazole  40 mg Oral Daily  . QUEtiapine  50 mg Oral TID  . thiamine  100 mg Oral Daily   Infusions:   Assessment: 49 yo male s/p femoral bypass will be started on vancomycin and zosyn for suspected infected graft.  He is febrile to 103.1 yesterday. SCr 1.28 on 03/01 (CrCl ~82)  03/01: blood cx x 2 >> pending 03/01: ucx >> pending  03/01: vanc >> 03/01: zosyn >>  Goal of Therapy:  Vancomycin trough level 15-20 mcg/ml  Plan:  - vancomycin 1250 mg iv q12h - zosyn 3.375g iv q8h (4hr infusion) - monitor culture and sensitivity - monitor renal function and check vancomycin trough when it's appropriate -   Trevonne Nyland, Tsz-Yin 08/29/2014,1:41 PM

## 2014-08-29 NOTE — Progress Notes (Signed)
Occupational Therapy Session Note  Patient Details  Name: [name] LauthRobert [name] MRN: [id number] Date of Birth: 1965-12-11  Today's Date: 08/29/2014 OT Individual Time: [id number] OT Individual Time Calculation (min): 60 min    Short Term Goals: Week 1:  OT Short Term Goal 1 (Week 1): STG=LTG due to anticipated short LOS  Skilled Therapeutic Interventions/Progress Updates: ADL-retraining with focus on improved safety awareness, caregiver ed, adapted dressing, sit>stand, w/c mobility, and emergent awareness.   Pt received from RN who alerted therapist to possible medical decline d/t high temp and HR.   Upon entering room, OT noted pt seated at w/c with breakfast tray on overbed table and conversing with his caregiver who was showering in bathroom.   Pt noted with beads of perspiration at forehead although not complaining of symptoms and ready to engage in treatment with therapist.   Pt rejected bathing or toileting stating he had bathed day prior with therapist and he did not want to review toileting transfers or habits.   Pt agreed to change of clothing (dressing), to discuss discharge plans, perform transfer and bed mobility, and demonstrate competence with w/c mobility.   Pt completed dressing at supervision level d/t need for continuous cues/prompts to progress with setup to reposition w/c and lift bed rails to allow pt to weight bear through arms to pull up his pants.   Pt requires supervision for problem-solving and attention specifically during dynamic standing task but remains safe at w/c level throughout session.    Pt reports discharge plan to resume primary interest of watching movies with his 5555" TV and attending family outings.   Pt retains service of PCA for daily BADL and does not attempt meal prep unsupervised.     Therapy Documentation Precautions:  Precautions Precautions: Fall Precaution Comments: L AKA Restrictions Weight Bearing Restrictions: Yes LLE Weight Bearing: Non weight  bearing Other Position/Activity Restrictions: No heavy lifting/straining for 4 weeks d/t hernia; R groin wound   Vital Signs: Therapy Vitals Temp: (!) 103.1 F (39.5 C) Temp Source: Oral Pulse Rate: (!) 108 Resp: 19 BP: (!) 133/95 mmHg Patient Position (if appropriate): Lying Oxygen Therapy SpO2: 100 % O2 Device: Not Delivered   Pain: Pain Assessment Pain Score: 4   ADL: ADL ADL Comments: see FIM  See FIM for current functional status  Therapy/Group: Individual Therapy  [name] [name] 08/29/2014, 8:47 AM

## 2014-08-30 ENCOUNTER — Inpatient Hospital Stay (HOSPITAL_COMMUNITY): Payer: Medicaid Other | Admitting: Physical Therapy

## 2014-08-30 ENCOUNTER — Inpatient Hospital Stay (HOSPITAL_COMMUNITY): Payer: Medicaid Other | Admitting: Occupational Therapy

## 2014-08-30 LAB — CBC
HCT: 33.1 % — ABNORMAL LOW (ref 39.0–52.0)
Hemoglobin: 10.7 g/dL — ABNORMAL LOW (ref 13.0–17.0)
MCH: 28.2 pg (ref 26.0–34.0)
MCHC: 32.3 g/dL (ref 30.0–36.0)
MCV: 87.1 fL (ref 78.0–100.0)
PLATELETS: 349 10*3/uL (ref 150–400)
RBC: 3.8 MIL/uL — ABNORMAL LOW (ref 4.22–5.81)
RDW: 14.2 % (ref 11.5–15.5)
WBC: 13.7 10*3/uL — ABNORMAL HIGH (ref 4.0–10.5)

## 2014-08-30 LAB — URINE CULTURE
COLONY COUNT: NO GROWTH
CULTURE: NO GROWTH

## 2014-08-30 LAB — GLUCOSE, CAPILLARY
GLUCOSE-CAPILLARY: 145 mg/dL — AB (ref 70–99)
GLUCOSE-CAPILLARY: 155 mg/dL — AB (ref 70–99)
GLUCOSE-CAPILLARY: 100 mg/dL — AB (ref 70–99)
Glucose-Capillary: 139 mg/dL — ABNORMAL HIGH (ref 70–99)

## 2014-08-30 MED ORDER — OXYCODONE HCL 5 MG PO TABS
10.0000 mg | ORAL_TABLET | ORAL | Status: DC | PRN
  Administered 2014-08-30 – 2014-08-31 (×3): 10 mg via ORAL
  Filled 2014-08-30 (×3): qty 2

## 2014-08-30 MED ORDER — BACLOFEN 5 MG HALF TABLET
5.0000 mg | ORAL_TABLET | Freq: Three times a day (TID) | ORAL | Status: DC | PRN
  Filled 2014-08-30: qty 1

## 2014-08-30 NOTE — Progress Notes (Signed)
Physical Therapy Session Note  Patient Details  Name: [name] LauthRobert [name] MRN: [id number] Date of Birth: 1965/11/13  Today's Date: 08/30/2014 PT Individual Time: 1100-1130 PT Individual Time Calculation (min): 30 min   Short Term Goals: Week 1:  PT Short Term Goal 1 (Week 1): STG's = LTG's secondary to anticipated LOS.  Skilled Therapeutic Interventions/Progress Updates:    Therapeutic Activity: Pt received in bed, sleeping, with IV attached. Pt difficult to arouse and sleepy throughout PT session. PT took pt's vitals and all are wfl. Pt demonstrates ability to do bed mobility supine to sit from flat bed req increased time and SBA due to pt fatigue.  PT instructs pt in sit to stand transfer with RW req CGA for safety and encouragement to participate and transfer to w/c.  PT instructs pt in w/c propulsion req significantly increased time to propel w/c to door of room. PT plans to end therapy, when pt requests to use toilet. PT instructs pt in stand-pivot transfer w/c to toilet with grab bar req Supervision for safety - once pt standing and pivoted, he pivots back to w/c and refuses to get on toilet with PT in room. Pt's CG assists pt onto toilet and helps with pants management, as well as double checking posterior hygiene with a wife. PT session ended 30 minutes early due to pt refusal to participate any more with PT.   Pt's mental health behaviors are limiting his participation in therapy. PT is unsure pt's current functional mobility level as he is being followed closely by medical team due to infection in his body. Pt's behaviors and drowsiness from pain meds limit his participation. Continue per PT POC, as pt agreeable and able to participate.   Therapy Documentation Precautions:  Precautions Precautions: Fall Precaution Comments: L AKA Restrictions Weight Bearing Restrictions: Yes LLE Weight Bearing: Non weight bearing Other Position/Activity Restrictions: No heavy lifting/straining for 4  weeks d/t hernia; R groin wound General: PT Amount of Missed Time (min): 30 Minutes PT Missed Treatment Reason: Patient unwilling to participate Vital Signs: Therapy Vitals Pulse Rate: 80 BP: 108/63 mmHg Patient Position (if appropriate): Sitting Oxygen Therapy SpO2: 98 % O2 Device: Not Delivered  See FIM for current functional status  Therapy/Group: Individual Therapy  [name] 08/30/2014, 11:34 AM

## 2014-08-30 NOTE — Progress Notes (Signed)
49 y.o. 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  CTA revealed no flow at the common femoral artery level or iliac level on the left and patient underwent axillary to femoral bypass on right to left Fem-Fem bypass on 02/19  with attempts at limb salvage.  On 02/20, patient spiked a temp of 103 with AMS and decrease in UOP. He was started on IV fluids as well as IV antibiotics for septic shock. Dr. Donnetta Hutching felt that sepsis due to ischemic L-BKA and AKA v/s hip disarticulation disused with patient. Patient underwent high L-AKA on 02/21 and has defervesced. Post op with lethargy as well as difficulty with processing due to delirium.  Fentanyl patch added to help with pain management and hypokalemia being treated. He has had issues with fluctuating bouts of lethargy with anxiety, hypotension requiring fluid bolus with banana bag as well as transfusion with 2 units PRBC for ABLA.  IVF d/c and he was treated with diuretics due to evidence of fluid overload.  He continues on IV zosyn for FUO with recommendations to continue this for now with close monitoring of wounds due to concerns of breakdown.  Subjective/Complaints: Had a better night. Pain more in left thigh/groin this morning.  Temps down. More comfortable  Review of Systems - Negative except tired  Objective: Vital Signs: Blood pressure 130/63, pulse 92, temperature 98.8 F (37.1 C), temperature source Oral, resp. rate 20, weight 96.7 kg (213 lb 3 oz), SpO2 94 %. Dg Chest 2 View  08/29/2014   CLINICAL DATA:  Fever.  EXAM: CHEST  2 VIEW  COMPARISON:  08/22/2014  FINDINGS: Lungs are adequately inflated without consolidation or effusion. Borderline stable cardiomegaly. Remainder the exam is unchanged.  IMPRESSION: No active cardiopulmonary disease.   Electronically  Signed   By: Marin Olp M.D.   On: 08/29/2014 12:09   Results for orders placed or performed during the hospital encounter of 08/24/14 (from the past 72 hour(s))  Glucose, capillary     Status: Abnormal   Collection Time: 08/27/14 11:26 AM  Result Value Ref Range   Glucose-Capillary 128 (H) 70 - 99 mg/dL   Comment 1 Notify RN   Glucose, capillary     Status: Abnormal   Collection Time: 08/27/14  4:56 PM  Result Value Ref Range   Glucose-Capillary 143 (H) 70 - 99 mg/dL  Glucose, capillary     Status: Abnormal   Collection Time: 08/27/14  8:59 PM  Result Value Ref Range   Glucose-Capillary 147 (H) 70 - 99 mg/dL   Comment 1 Notify RN   Glucose, capillary     Status: Abnormal   Collection Time: 08/28/14  6:58 AM  Result Value Ref Range   Glucose-Capillary 105 (H) 70 - 99 mg/dL   Comment 1 Notify RN   Glucose, capillary     Status: Abnormal   Collection Time: 08/28/14 11:32 AM  Result Value Ref Range   Glucose-Capillary 154 (H) 70 - 99 mg/dL  Glucose, capillary     Status: Abnormal   Collection Time: 08/28/14  4:56 PM  Result Value Ref Range   Glucose-Capillary 137 (H) 70 - 99 mg/dL  Glucose, capillary     Status: Abnormal   Collection Time: 08/28/14  9:13 PM  Result Value Ref Range   Glucose-Capillary 121 (H) 70 -  99 mg/dL   Comment 1 Notify RN   Glucose, capillary     Status: Abnormal   Collection Time: 08/29/14  6:45 AM  Result Value Ref Range   Glucose-Capillary 140 (H) 70 - 99 mg/dL   Comment 1 Notify RN   CBC     Status: Abnormal   Collection Time: 08/29/14  8:48 AM  Result Value Ref Range   WBC 18.1 (H) 4.0 - 10.5 K/uL   RBC 4.09 (L) 4.22 - 5.81 MIL/uL   Hemoglobin 11.6 (L) 13.0 - 17.0 g/dL   HCT 35.7 (L) 39.0 - 52.0 %   MCV 87.3 78.0 - 100.0 fL   MCH 28.4 26.0 - 34.0 pg   MCHC 32.5 30.0 - 36.0 g/dL   RDW 13.9 11.5 - 15.5 %   Platelets 398 150 - 400 K/uL  Basic metabolic panel     Status: Abnormal   Collection Time: 08/29/14  8:48 AM  Result Value Ref Range    Sodium 136 135 - 145 mmol/L   Potassium 4.4 3.5 - 5.1 mmol/L   Chloride 97 96 - 112 mmol/L   CO2 27 19 - 32 mmol/L   Glucose, Bld 211 (H) 70 - 99 mg/dL   BUN 8 6 - 23 mg/dL   Creatinine, Ser 1.28 0.50 - 1.35 mg/dL   Calcium 9.2 8.4 - 10.5 mg/dL   GFR calc non Af Amer 65 (L) >90 mL/min   GFR calc Af Amer 75 (L) >90 mL/min    Comment: (NOTE) The eGFR has been calculated using the CKD EPI equation. This calculation has not been validated in all clinical situations. eGFR's persistently <90 mL/min signify possible Chronic Kidney Disease.    Anion gap 12 5 - 15  Hepatic function panel     Status: Abnormal   Collection Time: 08/29/14  8:48 AM  Result Value Ref Range   Total Protein 7.3 6.0 - 8.3 g/dL   Albumin 2.6 (L) 3.5 - 5.2 g/dL   AST 41 (H) 0 - 37 U/L   ALT 61 (H) 0 - 53 U/L   Alkaline Phosphatase 87 39 - 117 U/L   Total Bilirubin 0.5 0.3 - 1.2 mg/dL   Bilirubin, Direct 0.1 0.0 - 0.5 mg/dL   Indirect Bilirubin 0.4 0.3 - 0.9 mg/dL  Urinalysis, Routine w reflex microscopic     Status: Abnormal   Collection Time: 08/29/14  9:30 AM  Result Value Ref Range   Color, Urine AMBER (A) YELLOW    Comment: BIOCHEMICALS MAY BE AFFECTED BY COLOR   APPearance CLEAR CLEAR   Specific Gravity, Urine 1.026 1.005 - 1.030   pH 5.5 5.0 - 8.0   Glucose, UA NEGATIVE NEGATIVE mg/dL   Hgb urine dipstick NEGATIVE NEGATIVE   Bilirubin Urine SMALL (A) NEGATIVE   Ketones, ur 15 (A) NEGATIVE mg/dL   Protein, ur NEGATIVE NEGATIVE mg/dL   Urobilinogen, UA 0.2 0.0 - 1.0 mg/dL   Nitrite NEGATIVE NEGATIVE   Leukocytes, UA NEGATIVE NEGATIVE    Comment: MICROSCOPIC NOT DONE ON URINES WITH NEGATIVE PROTEIN, BLOOD, LEUKOCYTES, NITRITE, OR GLUCOSE <1000 mg/dL.  Glucose, capillary     Status: None   Collection Time: 08/29/14 11:37 AM  Result Value Ref Range   Glucose-Capillary 93 70 - 99 mg/dL  Glucose, capillary     Status: Abnormal   Collection Time: 08/29/14  4:47 PM  Result Value Ref Range    Glucose-Capillary 140 (H) 70 - 99 mg/dL  Glucose, capillary  Status: Abnormal   Collection Time: 08/29/14  8:39 PM  Result Value Ref Range   Glucose-Capillary 140 (H) 70 - 99 mg/dL  Glucose, capillary     Status: Abnormal   Collection Time: 08/30/14  6:45 AM  Result Value Ref Range   Glucose-Capillary 145 (H) 70 - 99 mg/dL     HEENT: poor dentition Cardio: RRR and no murmur Resp: CTA B/L and unlabored. No rales GI: BS positive and NT, ND Extremity:  Edema 1+ Right pretibial Skin:   WoundsC/D/I- Neuro: Lethargic, Abnormal Sensory decrease LT R foot and Abnormal Motor 5/5 in BUE, 4/5 RLE, 3- L HF Musc/Skel:  Other Left AKA site intact. Area is tender to palpation toward groin. RLE less tender this morning---ischemic big toe. Leg warm. GEN-sweating profusely   Assessment/Plan: 1. Functional deficits secondary to Left AKA, RLE PAD with ischemia which require 3+ hours per day of interdisciplinary therapy in a comprehensive inpatient rehab setting. Physiatrist is providing close team supervision and 24 hour management of active medical problems listed below. Physiatrist and rehab team continue to assess barriers to discharge/monitor patient progress toward functional and medical goals.  Dc now on hold given ID issues.  FIM: FIM - Bathing Bathing Steps Patient Completed: Chest, Right Arm, Left Arm, Abdomen, Front perineal area, Buttocks, Right upper leg, Left upper leg, Right lower leg (including foot) Bathing: 5: Supervision: Safety issues/verbal cues  FIM - Upper Body Dressing/Undressing Upper body dressing/undressing steps patient completed: Thread/unthread right sleeve of pullover shirt/dresss, Thread/unthread left sleeve of pullover shirt/dress, Put head through opening of pull over shirt/dress, Pull shirt over trunk Upper body dressing/undressing: 5: Supervision: Safety issues/verbal cues FIM - Lower Body Dressing/Undressing Lower body dressing/undressing steps patient  completed: Thread/unthread right underwear leg, Thread/unthread left underwear leg, Pull underwear up/down, Thread/unthread right pants leg, Thread/unthread left pants leg, Pull pants up/down, Fasten/unfasten pants, Don/Doff right shoe Lower body dressing/undressing: 5: Set-up assist to: Obtain clothing  FIM - Toileting Toileting steps completed by patient: Adjust clothing prior to toileting Toileting Assistive Devices: Grab bar or rail for support Toileting: 6: More than reasonable amount of time  FIM - Radio producer Devices: Grab bars Toilet Transfers: 5-To toilet/BSC: Supervision (verbal cues/safety issues), 5-From toilet/BSC: Supervision (verbal cues/safety issues)  FIM - Control and instrumentation engineer Devices: Environmental consultant, Arm rests Bed/Chair Transfer: 6: Supine > Sit: No assist, 6: Sit > Supine: No assist, 5: Bed > Chair or W/C: Supervision (verbal cues/safety issues), 5: Chair or W/C > Bed: Supervision (verbal cues/safety issues)  FIM - Locomotion: Wheelchair Distance: 200 Locomotion: Wheelchair: 6: Travels 150 ft or more, turns around, maneuvers to table, bed or toilet, negotiates 3% grade: maneuvers on rugs and over door sills independently FIM - Locomotion: Ambulation Locomotion: Ambulation Assistive Devices: Administrator Ambulation/Gait Assistance: 5: Supervision Locomotion: Ambulation: 2: Travels 50 - 149 ft with supervision/safety issues  Comprehension Comprehension Mode: Auditory Comprehension: 4-Understands basic 75 - 89% of the time/requires cueing 10 - 24% of the time  Expression Expression Mode: Verbal Expression: 5-Expresses basic needs/ideas: With extra time/assistive device  Social Interaction Social Interaction: 4-Interacts appropriately 75 - 89% of the time - Needs redirection for appropriate language or to initiate interaction.  Problem Solving Problem Solving: 3-Solves basic 50 - 74% of the time/requires cueing  25 - 49% of the time  Memory Memory: 4-Recognizes or recalls 75 - 89% of the time/requires cueing 10 - 24% of the time   Medical Problem List and Plan: 1. Functional  deficits secondary to  Left BKA revised to AKA. RLE PAD with ischemia 2.  DVT Prophylaxis/Anticoagulation: Pharmaceutical: Lovenox 3. Pain Management: Continue  Neurontin for neuropathic symptoms. On Fentanyl with oxycodone prn for break thorough pain. Monitor for excessive sedation on narcotics.    -baclofen prn for spasms 4. Bipolar disorder/Mood: His behavior with anxiety, fluctuating mental status and confusion are similar and well known from last admission. Team to provide ego support to help patient to work thorough behavioral issues. Will have neuropsych follow up also to help with coping skills. Continue Wellbutrin and Seroquel for mood stabilization. On Adderall chronically to help with activation/energy? Discrepancy on how patient was taking meds--xanax .6m tid and seroquel 100 mg tid with  300 mg at bedtime?.  5. Neuropsych: This patient is not capable of making decisions on his own behalf. 6. Skin/Wound Care: Routine pressure relief measures. Monitor wound daily for healing as well as RLE for any ischemia. 7. Fluids/Electrolytes/Nutrition: Monitor I/O. Offer sugar free supplements between meals to avoid BS variations. Monitor daily weights.    8. Abnormal LFTs: Due to shocked liver. Does have history of alcohol abuse--  -outpt follow up 9. Wound prophylaxis/Fevers: see below 10. HTN:  Monitor BP every 8 hours. Medications resumed today--monitor for effectiveness and titrate as indicated.   11. Tachycardia: Was on atenolol bid at home. Will resume at lower dose and monitor for effectiveness.   13. DM type 2: Monitor BS with ac/hs checks. Continue lantus insulin with SSI for elevated BS. Metformin held  -sugars well controlled 14. Fever, ?abscess  -temps down   -blood cultures x2 pending  -urinalysis clean, culture  pending  -chest xray clean  -Vascular surgery following---Early to see today, ?CT of left leg,abdomen?  -continue abx  LOS (Days) 6 A FACE TO FACE EVALUATION WAS PERFORMED  Zackrey Dyar T 08/30/2014, 7:49 AM

## 2014-08-30 NOTE — Progress Notes (Addendum)
  Progress Note    08/30/2014 8:22 AM   Subjective:  Sleeping-appears more comfortable; girlfriend states he had a better night.  She says he complained of right knee pain.  Girlfriend worried he has candida as she has been researching on her phone.  Tm 100.7 now afebrile VSS 94% RA  Filed Vitals:   08/30/14 0533  BP: 130/63  Pulse: 92  Temp: 98.8 F (37.1 C)  Resp: 20    Physical Exam: Lungs:  Non labored Incisions:  All incisions are healing nicely; left stump is healing nicely without redness; edema in stump is better Extremities:  +palpable right DP; 1-2 pitting edema right foot;  Right great toe is demarcating-unchanged from yesterday   CBC    Component Value Date/Time   WBC 18.1* 08/29/2014 0848   RBC 4.09* 08/29/2014 0848   HGB 11.6* 08/29/2014 0848   HCT 35.7* 08/29/2014 0848   PLT 398 08/29/2014 0848   MCV 87.3 08/29/2014 0848   MCH 28.4 08/29/2014 0848   MCHC 32.5 08/29/2014 0848   RDW 13.9 08/29/2014 0848   LYMPHSABS 1.4 08/25/2014 0451   MONOABS 0.5 08/25/2014 0451   EOSABS 0.2 08/25/2014 0451   BASOSABS 0.0 08/25/2014 0451    BMET    Component Value Date/Time   NA 136 08/29/2014 0848   K 4.4 08/29/2014 0848   CL 97 08/29/2014 0848   CO2 27 08/29/2014 0848   GLUCOSE 211* 08/29/2014 0848   BUN 8 08/29/2014 0848   CREATININE 1.28 08/29/2014 0848   CALCIUM 9.2 08/29/2014 0848   GFRNONAA 65* 08/29/2014 0848   GFRAA 75* 08/29/2014 0848    INR    Component Value Date/Time   INR 1.29 08/20/2014 1625     Intake/Output Summary (Last 24 hours) at 08/30/14 [id number] Last data filed at 08/30/14 0426  Gross per 24 hour  Intake    600 ml  Output   1275 ml  Net   -675 ml     Assessment:  49 y.o. male is s/p:  Axillary to femoral bypass on the right with 8 mm Hemashield graft right to left femorofemoral bypass with 8 mm Hemashield graft 11 Days Post-Op And Left AKA   9 Days Post-Op     Plan: -pt more comfortable this am. Tm 100.7 at 4pm  yesterday, but is afebrile this am. -all wounds healing nicely and stump looks very good. Right great toe continues to demarcate -CK enzymes are pending -continue to observe stump -may need lasix for edema -lethargic-may need to cut back on some of his pain medication slightly -DVT prophylaxis:  Lovenox -prophylactic abx restarted yesterday (vanc/zosyn)   Doreatha MassedSamantha Rhyne, PA-C [facility] [phone] 08/30/2014 8:22 AM    I have examined the patient, reviewed and agree with above. Currently in physical therapy. Doing nicely as far standing with his walker. Denies any stump pain or any knee pain. His girlfriend reports he was complaining of knee pain last night. On physical exam his stump is completely nontender. I did not look at the incision but was seen earlier and felt to be normal. No lethargy currently. Alert oriented and talkative. Continue to look for source for fever. Stump appears to be benign.  Audrie Kuri, MD 08/30/2014 2:55 PM

## 2014-08-30 NOTE — Progress Notes (Signed)
Recreational Therapy Session Note  Patient Details  Name: [name] [name] MRN: [id number] Date of Birth: 1965-11-07 Today's Date: 08/30/2014  Pain: no c/o  Skilled Therapeutic Interventions/Progress Updates: Session focused on activity tolerance sit-stands, standing balance during co-treat with OT.  Pt propelled w/c half way from his room to therapy gym using BUE's with supervision.  ONCe in the gym, attempted Wii bowling in standing but pt unable to follow directions for game play.  Transitioned to horseshoes standing with 1UE support on RW.  Pt required mod assist for sit-stands and balance.  Pt tends to stand with flexed knee, requiring cuing to attempt to extend knee.  Therapy/Group: Co-Treatment   Colie Fugitt 08/30/2014, 4:44 PM
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
## 2014-08-30 NOTE — Progress Notes (Signed)
Occupational Therapy Session Note  Patient Details  Name: [name] LauthRobert [name] MRN: [id number] Date of Birth: 1966/05/02  Today's Date: 08/30/2014 OT Individual Time: 1401-1501 OT Individual Time Calculation (min): 60 min    Short Term Goals: Week 1:  OT Short Term Goal 1 (Week 1): STG=LTG due to anticipated short LOS  Skilled Therapeutic Interventions/Progress Updates:    Pt asleep in bed at beginning of session.  Easily awakened and transitioned to sitting EOB with min assist, but does need multiple cueing to process and follow multi-step commands.  Worked on finishing donning pull-up pants over the LLE and then performed sit to stand with mod assist to pull them up over his bottom.  Co-tx for remainder of session with TR.  Pt rolled himself 1/2 way down to the therapy gym with increased time and supervision.  Therapist assisted with the second half secondary to time.  In the gym focused session on sit to stand, standing endurance, and standing balance.  Attempted to engage pt in use of the Wii for standing but unable to understand directions and follow for use of the controller.  Modified session to standing to perform horseshoe toss. He was able to perform multiple sit to stand transitions with mod assist while tossing horseshoes with the RUE.  In standing, pt unable to maintain full knee extension and demonstrates slight knee flexion.  He was able to maintain standing for 1-3 minute intervals but needs max instructional cueing for hand placement with all sit to stand transitions.  Pt with slower than normal processing in all tasks.  Currently, unsure if it is related to his infection.  Pt returned to room at end of session with wife and other family members present.   Therapy Documentation Precautions:  Precautions Precautions: Fall Precaution Comments: L AKA Restrictions Weight Bearing Restrictions: Yes LLE Weight Bearing: Non weight bearing Other Position/Activity Restrictions: No heavy  lifting/straining for 4 weeks d/t hernia; R groin wound  Pain: Pain Assessment Pain Assessment: Faces Faces Pain Scale: Hurts a little bit Pain Type: Acute pain Pain Location: Hand Pain Orientation: Left Pain Intervention(s): Repositioned;Ambulation/increased activity ADL: See FIM for current functional status  Therapy/Group: Individual Therapy  Marilla Boddy OTR/L 08/30/2014, 3:57 PM

## 2014-08-31 ENCOUNTER — Inpatient Hospital Stay (HOSPITAL_COMMUNITY): Payer: Self-pay

## 2014-08-31 ENCOUNTER — Inpatient Hospital Stay (HOSPITAL_COMMUNITY): Payer: Medicaid Other | Admitting: Physical Therapy

## 2014-08-31 DIAGNOSIS — D72829 Elevated white blood cell count, unspecified: Secondary | ICD-10-CM | POA: Diagnosis not present

## 2014-08-31 DIAGNOSIS — R600 Localized edema: Secondary | ICD-10-CM

## 2014-08-31 DIAGNOSIS — F419 Anxiety disorder, unspecified: Secondary | ICD-10-CM

## 2014-08-31 DIAGNOSIS — D62 Acute posthemorrhagic anemia: Secondary | ICD-10-CM | POA: Diagnosis present

## 2014-08-31 LAB — GLUCOSE, CAPILLARY
GLUCOSE-CAPILLARY: 131 mg/dL — AB (ref 70–99)
GLUCOSE-CAPILLARY: 126 mg/dL — AB (ref 70–99)

## 2014-08-31 MED ORDER — OXYCODONE HCL 10 MG PO TABS: 10.0000 mg | ORAL_TABLET | ORAL | Status: DC | PRN

## 2014-08-31 MED ORDER — ADULT MULTIVITAMIN W/MINERALS CH: 1.0000 | ORAL_TABLET | Freq: Every day | ORAL | Status: DC

## 2014-08-31 MED ORDER — HYDROCHLOROTHIAZIDE 50 MG PO TABS: 25.0000 mg | ORAL_TABLET | Freq: Every day | ORAL | Status: AC

## 2014-08-31 MED ORDER — ALPRAZOLAM 1 MG PO TABS: 0.5000 mg | ORAL_TABLET | Freq: Three times a day (TID) | ORAL | Status: DC

## 2014-08-31 MED ORDER — TRAMADOL HCL 50 MG PO TABS: 50.0000 mg | ORAL_TABLET | Freq: Four times a day (QID) | ORAL | Status: DC | PRN

## 2014-08-31 MED ORDER — FOLIC ACID 1 MG PO TABS: 1.0000 mg | ORAL_TABLET | Freq: Every day | ORAL | Status: DC

## 2014-08-31 MED ORDER — POLYSACCHARIDE IRON COMPLEX 150 MG PO CAPS: 150.0000 mg | ORAL_CAPSULE | Freq: Two times a day (BID) | ORAL | Status: DC

## 2014-08-31 MED ORDER — THIAMINE HCL 100 MG PO TABS: 100.0000 mg | ORAL_TABLET | Freq: Every day | ORAL | Status: DC

## 2014-08-31 MED ORDER — FENTANYL 12 MCG/HR TD PT72: 12.5000 ug | MEDICATED_PATCH | TRANSDERMAL | Status: DC

## 2014-08-31 MED ORDER — NICOTINE 21 MG/24HR TD PT24: MEDICATED_PATCH | TRANSDERMAL | Status: DC

## 2014-08-31 MED ORDER — HYDROCHLOROTHIAZIDE 50 MG PO TABS: 50.0000 mg | ORAL_TABLET | Freq: Every day | ORAL | Status: DC

## 2014-08-31 MED ORDER — ATENOLOL 50 MG PO TABS: 50.0000 mg | ORAL_TABLET | Freq: Two times a day (BID) | ORAL | Status: AC

## 2014-08-31 MED ORDER — ATENOLOL 25 MG PO TABS: 50.0000 mg | ORAL_TABLET | Freq: Two times a day (BID) | ORAL | Status: DC

## 2014-08-31 MED ORDER — AMPHETAMINE-DEXTROAMPHETAMINE 10 MG PO TABS: 20.0000 mg | ORAL_TABLET | Freq: Every day | ORAL | Status: DC

## 2014-08-31 MED ORDER — QUETIAPINE FUMARATE 50 MG PO TABS: 50.0000 mg | ORAL_TABLET | Freq: Three times a day (TID) | ORAL | Status: DC

## 2014-08-31 MED ORDER — BUPROPION HCL 100 MG PO TABS: 100.0000 mg | ORAL_TABLET | Freq: Two times a day (BID) | ORAL | Status: AC

## 2014-08-31 NOTE — Progress Notes (Signed)
49 y.o. 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  CTA revealed no flow at the common femoral artery level or iliac level on the left and patient underwent axillary to femoral bypass on right to left Fem-Fem bypass on 02/19  with attempts at limb salvage.  On 02/20, patient spiked a temp of 103 with AMS and decrease in UOP. He was started on IV fluids as well as IV antibiotics for septic shock. Dr. Donnetta Hutching felt that sepsis due to ischemic L-BKA and AKA v/s hip disarticulation disused with patient. Patient underwent high L-AKA on 02/21 and has defervesced. Post op with lethargy as well as difficulty with processing due to delirium.  Fentanyl patch added to help with pain management and hypokalemia being treated. He has had issues with fluctuating bouts of lethargy with anxiety, hypotension requiring fluid bolus with banana bag as well as transfusion with 2 units PRBC for ABLA.  IVF d/c and he was treated with diuretics due to evidence of fluid overload.  He continues on IV zosyn for FUO with recommendations to continue this for now with close monitoring of wounds due to concerns of breakdown.  Subjective/Complaints: Had a better night. Pain more in left thigh/groin this morning.  Temps down. More comfortable  Review of Systems - Negative except tired  Objective: Vital Signs: Blood pressure 126/67, pulse 81, temperature 98 F (36.7 C), temperature source Oral, resp. rate 18, height 6' 2"  (1.88 m), weight 97.65 kg (215 lb 4.5 oz), SpO2 100 %. Dg Chest 2 View  08/29/2014   CLINICAL DATA:  Fever.  EXAM: CHEST  2 VIEW  COMPARISON:  08/22/2014  FINDINGS: Lungs are adequately inflated without consolidation or effusion. Borderline stable cardiomegaly. Remainder the exam is unchanged.  IMPRESSION: No active cardiopulmonary  disease.   Electronically Signed   By: Marin Olp M.D.   On: 08/29/2014 12:09   Results for orders placed or performed during the hospital encounter of 08/24/14 (from the past 72 hour(s))  Glucose, capillary     Status: Abnormal   Collection Time: 08/28/14 11:32 AM  Result Value Ref Range   Glucose-Capillary 154 (H) 70 - 99 mg/dL  Glucose, capillary     Status: Abnormal   Collection Time: 08/28/14  4:56 PM  Result Value Ref Range   Glucose-Capillary 137 (H) 70 - 99 mg/dL  Glucose, capillary     Status: Abnormal   Collection Time: 08/28/14  9:13 PM  Result Value Ref Range   Glucose-Capillary 121 (H) 70 - 99 mg/dL   Comment 1 Notify RN   Glucose, capillary     Status: Abnormal   Collection Time: 08/29/14  6:45 AM  Result Value Ref Range   Glucose-Capillary 140 (H) 70 - 99 mg/dL   Comment 1 Notify RN   Culture, blood (routine x 2)     Status: None (Preliminary result)   Collection Time: 08/29/14  8:41 AM  Result Value Ref Range   Specimen Description BLOOD LEFT HAND    Special Requests      BOTTLES DRAWN AEROBIC AND ANAEROBIC 10CC BLUE  5CC RED   Culture             BLOOD CULTURE RECEIVED NO GROWTH TO DATE CULTURE WILL BE HELD FOR 5 DAYS BEFORE ISSUING A FINAL NEGATIVE REPORT Performed at  [facility]    Report Status PENDING   CBC     Status: Abnormal   Collection Time: 08/29/14  8:48 AM  Result Value Ref Range   WBC 18.1 (H) 4.0 - 10.5 K/uL   RBC 4.09 (L) 4.22 - 5.81 MIL/uL   Hemoglobin 11.6 (L) 13.0 - 17.0 g/dL   HCT 35.7 (L) 39.0 - 52.0 %   MCV 87.3 78.0 - 100.0 fL   MCH 28.4 26.0 - 34.0 pg   MCHC 32.5 30.0 - 36.0 g/dL   RDW 13.9 11.5 - 15.5 %   Platelets 398 150 - 400 K/uL  Basic metabolic panel     Status: Abnormal   Collection Time: 08/29/14  8:48 AM  Result Value Ref Range   Sodium 136 135 - 145 mmol/L   Potassium 4.4 3.5 - 5.1 mmol/L   Chloride 97 96 - 112 mmol/L   CO2 27 19 - 32 mmol/L   Glucose, Bld 211 (H) 70 - 99 mg/dL   BUN 8 6 - 23 mg/dL    Creatinine, Ser 1.28 0.50 - 1.35 mg/dL   Calcium 9.2 8.4 - 10.5 mg/dL   GFR calc non Af Amer 65 (L) >90 mL/min   GFR calc Af Amer 75 (L) >90 mL/min    Comment: (NOTE) The eGFR has been calculated using the CKD EPI equation. This calculation has not been validated in all clinical situations. eGFR's persistently <90 mL/min signify possible Chronic Kidney Disease.    Anion gap 12 5 - 15  Culture, blood (routine x 2)     Status: None (Preliminary result)   Collection Time: 08/29/14  8:48 AM  Result Value Ref Range   Specimen Description RIGHT ANTECUBITAL    Special Requests      BOTTLES DRAWN AEROBIC AND ANAEROBIC 10CC BLUE  5CC RED   Culture             BLOOD CULTURE RECEIVED NO GROWTH TO DATE CULTURE WILL BE HELD FOR 5 DAYS BEFORE ISSUING A FINAL NEGATIVE REPORT Performed at [facility]    Report Status PENDING   Hepatic function panel     Status: Abnormal   Collection Time: 08/29/14  8:48 AM  Result Value Ref Range   Total Protein 7.3 6.0 - 8.3 g/dL   Albumin 2.6 (L) 3.5 - 5.2 g/dL   AST 41 (H) 0 - 37 U/L   ALT 61 (H) 0 - 53 U/L   Alkaline Phosphatase 87 39 - 117 U/L   Total Bilirubin 0.5 0.3 - 1.2 mg/dL   Bilirubin, Direct 0.1 0.0 - 0.5 mg/dL   Indirect Bilirubin 0.4 0.3 - 0.9 mg/dL  Urinalysis, Routine w reflex microscopic     Status: Abnormal   Collection Time: 08/29/14  9:30 AM  Result Value Ref Range   Color, Urine AMBER (A) YELLOW    Comment: BIOCHEMICALS MAY BE AFFECTED BY COLOR   APPearance CLEAR CLEAR   Specific Gravity, Urine 1.026 1.005 - 1.030   pH 5.5 5.0 - 8.0   Glucose, UA NEGATIVE NEGATIVE mg/dL   Hgb urine dipstick NEGATIVE NEGATIVE   Bilirubin Urine SMALL (A) NEGATIVE   Ketones, ur 15 (A) NEGATIVE mg/dL   Protein, ur NEGATIVE NEGATIVE mg/dL   Urobilinogen, UA 0.2 0.0 - 1.0 mg/dL   Nitrite NEGATIVE NEGATIVE   Leukocytes, UA NEGATIVE NEGATIVE    Comment: MICROSCOPIC NOT DONE ON URINES WITH NEGATIVE PROTEIN, BLOOD, LEUKOCYTES, NITRITE, OR  GLUCOSE <1000 mg/dL.  Culture, Urine  Status: None   Collection Time: 08/29/14  9:30 AM  Result Value Ref Range   Specimen Description URINE, CLEAN CATCH    Special Requests zosyn    Colony Count NO GROWTH Performed at [facility]     Culture NO GROWTH Performed at [facility]     Report Status 08/30/2014 FINAL   Glucose, capillary     Status: None   Collection Time: 08/29/14 11:37 AM  Result Value Ref Range   Glucose-Capillary 93 70 - 99 mg/dL  Glucose, capillary     Status: Abnormal   Collection Time: 08/29/14  4:47 PM  Result Value Ref Range   Glucose-Capillary 140 (H) 70 - 99 mg/dL  Glucose, capillary     Status: Abnormal   Collection Time: 08/29/14  8:39 PM  Result Value Ref Range   Glucose-Capillary 140 (H) 70 - 99 mg/dL  Glucose, capillary     Status: Abnormal   Collection Time: 08/30/14  6:45 AM  Result Value Ref Range   Glucose-Capillary 145 (H) 70 - 99 mg/dL  CBC     Status: Abnormal   Collection Time: 08/30/14  8:50 AM  Result Value Ref Range   WBC 13.7 (H) 4.0 - 10.5 K/uL   RBC 3.80 (L) 4.22 - 5.81 MIL/uL   Hemoglobin 10.7 (L) 13.0 - 17.0 g/dL   HCT 33.1 (L) 39.0 - 52.0 %   MCV 87.1 78.0 - 100.0 fL   MCH 28.2 26.0 - 34.0 pg   MCHC 32.3 30.0 - 36.0 g/dL   RDW 14.2 11.5 - 15.5 %   Platelets 349 150 - 400 K/uL  Glucose, capillary     Status: Abnormal   Collection Time: 08/30/14 11:20 AM  Result Value Ref Range   Glucose-Capillary 155 (H) 70 - 99 mg/dL  Glucose, capillary     Status: Abnormal   Collection Time: 08/30/14  4:59 PM  Result Value Ref Range   Glucose-Capillary 139 (H) 70 - 99 mg/dL  Glucose, capillary     Status: Abnormal   Collection Time: 08/30/14  9:13 PM  Result Value Ref Range   Glucose-Capillary 100 (H) 70 - 99 mg/dL  Glucose, capillary     Status: Abnormal   Collection Time: 08/31/14  6:28 AM  Result Value Ref Range   Glucose-Capillary 131 (H) 70 - 99 mg/dL     HEENT: poor dentition Cardio: RRR and no  murmur Resp: CTA B/L and unlabored. No rales GI: BS positive and NT, ND Extremity:  Edema 1+ Right pretibial Skin:   WoundsC/D/I- Neuro: Lethargic, Abnormal Sensory decrease LT R foot and Abnormal Motor 5/5 in BUE, 4/5 RLE, 3- L HF Musc/Skel:  Other Left AKA site intact.  RLE unchanged-ischemic big toe. Leg warm. GEN-appears comfortable   Assessment/Plan: 1. Functional deficits secondary to Left AKA, RLE PAD with ischemia which require 3+ hours per day of interdisciplinary therapy in a comprehensive inpatient rehab setting. Physiatrist is providing close team supervision and 24 hour management of active medical problems listed below. Physiatrist and rehab team continue to assess barriers to discharge/monitor patient progress toward functional and medical goals.  Dc home today with HH follow up, VVS, PM&R  FIM: FIM - Bathing Bathing Steps Patient Completed: Chest, Right Arm, Left Arm, Abdomen, Front perineal area, Buttocks, Right upper leg, Left upper leg, Right lower leg (including foot) Bathing: 5: Supervision: Safety issues/verbal cues  FIM - Upper Body Dressing/Undressing Upper body dressing/undressing steps patient completed: Thread/unthread right sleeve of pullover shirt/dresss,  Thread/unthread left sleeve of pullover shirt/dress, Put head through opening of pull over shirt/dress, Pull shirt over trunk Upper body dressing/undressing: 5: Supervision: Safety issues/verbal cues FIM - Lower Body Dressing/Undressing Lower body dressing/undressing steps patient completed: Don/Doff right shoe, Thread/unthread right pants leg, Thread/unthread left pants leg Lower body dressing/undressing: 3: Mod-Patient completed 50-74% of tasks  FIM - Toileting Toileting steps completed by patient: Adjust clothing prior to toileting Toileting Assistive Devices: Grab bar or rail for support Toileting: 1: Total-Patient completed zero steps, helper did all 3  FIM - Engineer, structural Devices: Bedside commode, Grab bars Toilet Transfers: 5-To toilet/BSC: Supervision (verbal cues/safety issues), 5-From toilet/BSC: Supervision (verbal cues/safety issues)  FIM - Control and instrumentation engineer Devices: Walker, Arm rests Bed/Chair Transfer: 5: Supine > Sit: Supervision (verbal cues/safety issues), 4: Bed > Chair or W/C: Min A (steadying Pt. > 75%)  FIM - Locomotion: Wheelchair Distance: 200 Locomotion: Wheelchair: 0: Activity did not occur FIM - Locomotion: Ambulation Locomotion: Ambulation Assistive Devices: Administrator Ambulation/Gait Assistance: 5: Supervision Locomotion: Ambulation: 0: Activity did not occur  Comprehension Comprehension Mode: Auditory Comprehension: 4-Understands basic 75 - 89% of the time/requires cueing 10 - 24% of the time  Expression Expression Mode: Verbal Expression: 5-Expresses basic needs/ideas: With extra time/assistive device  Social Interaction Social Interaction: 4-Interacts appropriately 75 - 89% of the time - Needs redirection for appropriate language or to initiate interaction.  Problem Solving Problem Solving: 3-Solves basic 50 - 74% of the time/requires cueing 25 - 49% of the time  Memory Memory: 4-Recognizes or recalls 75 - 89% of the time/requires cueing 10 - 24% of the time   Medical Problem List and Plan: 1. Functional deficits secondary to  Left BKA revised to AKA. RLE PAD with ischemia 2.  DVT Prophylaxis/Anticoagulation: Pharmaceutical: Lovenox 3. Pain Management: Continue  Neurontin for neuropathic symptoms. On Fentanyl with oxycodone prn for break thorough pain. Monitor for excessive sedation on narcotics.    -baclofen prn for spasms--can use this at home  -dc fentanyl patch after 2 weeks 4. Bipolar disorder/Mood: His behavior with anxiety, fluctuating mental status and confusion are similar and well known from last admission. Team to provide ego support to help patient to work  thorough behavioral issues. Will have neuropsych follow up also to help with coping skills. Continue Wellbutrin and Seroquel for mood stabilization. On Adderall chronically to help with activation/energy? Discrepancy on how patient was taking meds--xanax .26m tid and seroquel 100 mg tid with  300 mg at bedtime?.  5. Neuropsych: This patient is not capable of making decisions on his own behalf. 6. Skin/Wound Care: Routine pressure relief measures. Monitor wound daily for healing as well as RLE for any ischemia. 7. Fluids/Electrolytes/Nutrition: Monitor I/O. Offer sugar free supplements between meals to avoid BS variations. Monitor daily weights.    8. Abnormal LFTs: Due to shocked liver. Does have history of alcohol abuse--  -outpt follow up 9. Wound prophylaxis/Fevers: see below 10. HTN:  Monitor BP every 8 hours. Medications resumed today--monitor for effectiveness and titrate as indicated.   11. Tachycardia: atenolol.   13. DM type 2: Monitor BS with ac/hs checks. Continue lantus insulin with SSI for elevated BS. Metformin held  -sugars well controlled 14. Fever, --afebrile, wbc's down yestrday  -VVS has seen  -ok to go home off antibiotics with observation per Dr. EDonnetta Hutching -pt is to contact Dr. EDonnetta Hutchingif any problems arise.  LOS (Days) 7 A FACE TO FACE EVALUATION WAS PERFORMED  Kadir Azucena T 08/31/2014, 9:18 AM

## 2014-08-31 NOTE — Discharge Summary (Signed)
Physician Discharge Summary  Patient ID: [name] [name] MRN: [id number] DOB/AGE: 08/10/1965 49 y.o.  Admit date: 08/24/2014 Discharge date: 08/31/2014  Discharge Diagnoses:  Principal Problem:   Status post above knee amputation of left lower extremity Active Problems:   Anxiety   Fever of undetermined origin   Delirium   Diabetes type 2, uncontrolled   Peripheral vascular disease in diabetes mellitus   Leucocytosis   Postoperative anemia due to acute blood loss   Edema leg   Discharged Condition: Stable.   Significant Diagnostic Studies: Dg Chest 2 View  08/29/2014   CLINICAL DATA:  Fever.  EXAM: CHEST  2 VIEW  COMPARISON:  08/22/2014  FINDINGS: Lungs are adequately inflated without consolidation or effusion. Borderline stable cardiomegaly. Remainder the exam is unchanged.  IMPRESSION: No active cardiopulmonary disease.   Electronically Signed   By: Elberta Fortis M.D.   On: 08/29/2014 12:09    Labs:  Basic Metabolic Panel:  Recent Labs Lab 08/25/14 0451 08/29/14 0848  NA 140 136  K 3.6 4.4  CL 105 97  CO2 27 27  GLUCOSE 94 211*  BUN <5* 8  CREATININE 1.07 1.28  CALCIUM 8.3* 9.2    CBC:  Recent Labs Lab 08/25/14 0451 08/29/14 0848 08/30/14 0850  WBC 6.5 18.1* 13.7*  NEUTROABS 4.5  --   --   HGB 9.9* 11.6* 10.7*  HCT 30.9* 35.7* 33.1*  MCV 85.6 87.3 87.1  PLT 337 398 349    CBG:  Recent Labs Lab 08/30/14 1120 08/30/14 1659 08/30/14 2113 08/31/14 0628 08/31/14 1134  GLUCAP 155* 139* 100* 131* 126*    Brief HPI:   [name] is a 49 y.o. male with history of DM type 2, bipolar disorder, PVD s/p Fem-Fem BG with L-BKA 08/2013 who was admitted on 08/16/14 with progressive pain L-BKA with skin breakdown as well as RLE with evidence of ischemia. CTA revealed no flow at the common femoral artery level or iliac level on the left and patient underwent axillary to femoral bypass on right to left Fem-Fem bypass on 02/19 with attempts at limb salvage. On 02/20,  patient spiked a temp of 103 with AMS and decrease in UOP. He was started on IV fluids as well as IV antibiotics for septic shock. Dr. Arbie Cookey felt that sepsis due to ischemic L-BKA and patient underwent high L-AKA on 02/21. Post op course significant for hypotension requiring IVF, ABLA treated with 2 units PRBC as well as bouts of confusion with agitation. Mentation slowly improving and he was maintained on IV zosyn for FUO. Therapy ongoing and CIR was recommended by MD and rehab team.   Hospital Course: Linville Decarolis was admitted to rehab 08/24/2014 for inpatient therapies to consist of PT and OT at least three hours five days a week. Past admission physiatrist, therapy team and rehab RN have worked together to provide customized collaborative inpatient rehab. Follow up labs reveal abnormal LFTs are resolving and ABLA is stable.  Lethargy has resolved and pain has been reasonably controlled. L-BKA and bilateral groin wounds are healing well without signs or symptoms of infection. Right great toe has distal  ischemia which is stable without breakdown.  He has had increase in edema RLE with increase in activity and was advised on elevation for edema control. Blood pressures have been well controlled on lower dose of HCTZ, Norvasc and metoprolol. Diabetes was monitored with ac/hs checks and lantus alone has been effective in providing adequate blood sugar control. Patient and family has been  educated on CM diet as well as dietary discretion and are to monitor BS on tid basis past discharge.   Zosyn was discontinued on 02/26 and patient was doing well off antibiotics till 08/29/14 when he developed fever of 103 with increase in pain. CBC showed leucocytosis with WBC - 18.1.   Blood cultures X 2 were done and are negative so far. UA and CXR were negative. Wounds were noted to be healing well and + dopplers signals noted at PT/DT as well as fem-fem graft. He was placed on Van and zosyn per input from [facility] and has  deffervesced.  Follow up CBC showed WBC on downward trend on 03/02 and UCS showed no growth.  Dr. Arbie CookeyEarly has followed up for input and patient was cleared for discharge on 03/03. No follow up antibiotics recommended. Patient is back to baseline and has progressed to supervision level. [facility] HHPT recommended for follow up but not covered by Medicaid and patient unable to pay privately. Caregiver was educated on HEP.    Rehab course: During patient's stay in rehab weekly team conferences were held to monitor patient's progress, set goals and discuss barriers to discharge. At admission, patient required moderate assistance with basic self care tasks and max assist with mobility. He has had improvement in activity tolerance, balance, postural control, as well as ability to compensate for deficits.  He requires supervision for bathing and upper body dressing and requires moderate assist with lower body dressing tasks. He requires supervision to min assist with transfers.   Family education was done with caregiver who will provide assistance needed past discharge.     Disposition: 01-Home or Self Care  Diet: Diabetic diet.   Special Instructions: 1. Do not use naprosyn, lisinopril, Metformin, glipizide, 70/30 insulin, potassium 2. Note the decrease in  Xanax dose to help decrease sedation. 3. Wean oxycodone further to every 6 hours as needed after about a week.  4. Set appointment with Mental Health for refills.      Medication List    STOP taking these medications        famotidine 10 MG tablet  Commonly known as:  PEPCID     FLUoxetine 20 MG capsule  Commonly known as:  PROZAC     insulin NPH-regular Human (70-30) 100 UNIT/ML injection  Commonly known as:  NOVOLIN 70/30     metFORMIN 500 MG tablet  Commonly known as:  GLUCOPHAGE     nicotine 14 mg/24hr patch  Commonly known as:  NICODERM CQ - dosed in mg/24 hours   Replaced by:  nicotine 21 mg/24hr patch     piperacillin-tazobactam 3.375 GM/50ML IVPB  Commonly known as:  ZOSYN     Potassium Chloride ER 20 MEQ Tbcr      TAKE these medications        ALPRAZolam 1 MG tablet--Rx # 90 pills  Commonly known as:  XANAX  Take 0.5 tablets (0.5 mg total) by mouth 3 (three) times daily.     amphetamine-dextroamphetamine 10 MG tablet  Commonly known as:  ADDERALL  Take 2 tablets (20 mg total) by mouth daily with breakfast.     aspirin 325 MG EC tablet  Take 325 mg by mouth daily.     atenolol 50 MG tablet  Commonly known as:  TENORMIN  Take 1 tablet (50 mg total) by mouth 2 (two) times daily.     atorvastatin 40 MG tablet  Commonly known as:  LIPITOR  Take 40 mg by mouth every morning.     buPROPion 100 MG tablet  Commonly known as:  WELLBUTRIN  Take 1 tablet (100 mg total) by mouth 2 (two) times daily.     feeding supplement (ENSURE) Pudg  Take 1 Container by mouth 3 (three) times daily with meals.     fentaNYL 12 MCG/HR--Rx# 5 patches  Commonly known as:  DURAGESIC - dosed mcg/hr  Place 1 patch (12.5 mcg total) onto the skin every 3 (three) days.     folic acid 1 MG tablet  Commonly known as:  FOLVITE  Take 1 tablet (1 mg total) by mouth daily.     gabapentin 300 MG capsule  Commonly known as:  NEURONTIN  Take 1 capsule (300 mg total) by mouth 3 (three) times daily.     hydrochlorothiazide 50 MG tablet  Commonly known as:  HYDRODIURIL  Take 0.5 tablets (25 mg total) by mouth daily.     insulin glargine 100 UNIT/ML injection  Commonly known as:  LANTUS  Inject 0.4 mLs (40 Units total) into the skin at bedtime.     iron polysaccharides 150 MG capsule  Commonly known as:  NIFEREX  Take 1 capsule (150 mg total) by mouth 2 times daily at 12 noon and 4 pm.     multivitamin with minerals Tabs tablet  Take 1 tablet by mouth daily.     nicotine 21 mg/24hr patch  Commonly known as:  NICODERM CQ - dosed in mg/24 hours  Use 21 mg  for two weeks. Then 14 mg patch daily x4 weeks then 7 mg patch daily x 4 weeks. Then stop     omeprazole 20 MG capsule  Commonly known as:  PRILOSEC  Take 20 mg by mouth daily.     Oxycodone HCl 10 MG Tabs--Rx #120 pills  Take 1 tablet (10 mg total) by mouth every 4 (four) hours as needed for breakthrough pain.     QUEtiapine 50 MG tablet  Commonly known as:  SEROQUEL  Take 1 tablet (50 mg total) by mouth 3 (three) times daily.     thiamine 100 MG tablet  Take 1 tablet (100 mg total) by mouth daily.     traMADol 50 MG tablet--Rx #90 pills  Commonly known as:  ULTRAM  Take 1 tablet (50 mg total) by mouth every 6 (six) hours as needed for moderate pain.       Follow-up Information    Follow up with Ranelle Oyster, MD On 10/18/2014.   Specialty:  Physical Medicine and Rehabilitation   Why:  BE there at 11:45 am for 12 noon appointment    Contact information:   [address], [address] [place] [address] [phone]       Follow up with EARLY, TODD, MD On 09/19/2014.   Specialty:  Vascular Surgery   Why:  post op check at 9:45 am   Contact information:   [address] [place] [address] [phone]       Follow up with Dr. Mayford Knife @ [facility] On 09/11/2014.   Why:  @ 10:45 am   Contact information:   [phone]      Signed: Jacquelynn Cree 08/31/2014, 7:00 PM

## 2014-08-31 NOTE — Progress Notes (Signed)
Occupational Therapy Discharge Summary  Patient Details  Name: [name] [name] MRN: [id number] Date of Birth: [birth year]  Today's Date: 08/31/2014  Patient has met 11 of 12 long term goals due to improved activity tolerance, improved balance, ability to compensate for deficits and improved attention.  Patient to discharge at overall Supervision level.  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    Reasons goals not met: Pt demonstrated inconsistent performance with lower body dressing skills complicated by rigid behaviors of resistance to direction with inadequate coping/communication skills limiting his ability to collaborate and negotiate with allied health care providers.  Recommendation:  Patient will not benefit from ongoing skilled OT services.  Equipment: No equipment provided  Reasons for discharge: discharge from hospital  Patient/family agrees with progress made and goals achieved: Yes  OT Discharge Precautions/Restrictions  Precautions Precautions: Fall Precaution Comments: L AKA Restrictions Weight Bearing Restrictions: Yes LLE Weight Bearing: Non weight bearing Other Position/Activity Restrictions: No heavy lifting/straining for 3 weeks d/t hernia; R groin wound  Pain Pain Assessment Pain Assessment: 0-10 Pain Score: 7  Pain Type: Acute pain Pain Location: Leg Pain Orientation: Left Pain Descriptors / Indicators: Aching Pain Frequency: Constant Pain Onset: On-going Patients Stated Pain Goal: 3 Pain Intervention(s): Medication (See eMAR) Multiple Pain Sites: No  ADL ADL ADL Comments: see FIM  Vision/Perception  Vision-  History Baseline Vision/History: No visual deficits Patient Visual Report: No change from baseline Vision- Assessment Vision Assessment?: No apparent visual deficits Perception Comments: WFL  Cognition Overall Cognitive Status: History of cognitive impairments - at baseline Arousal/Alertness: Awake/alert Orientation Level: Oriented to situation;Disoriented to person;Disoriented to place;Disoriented to time Attention: Focused Focused Attention: Appears intact Sustained Attention: Impaired Sustained Attention Impairment: Verbal complex;Functional basic Memory: Impaired Memory Impairment: Storage deficit;Retrieval deficit;Decreased recall of new information;Decreased short term memory Decreased Short Term Memory: Verbal basic;Functional basic Awareness: Impaired Awareness Impairment: Anticipatory impairment Problem Solving: Impaired Problem Solving Impairment: Verbal complex;Functional basic Behaviors: Impulsive Safety/Judgment: Impaired  Sensation Sensation Light Touch: Impaired Detail Light Touch Impaired Details: Impaired RUE Stereognosis: Appears Intact Hot/Cold: Appears Intact Proprioception: Appears Intact Additional Comments: Reports numbness dorsal forearm not limiting grip/prehension pattern during BADL Coordination Gross Motor Movements are Fluid and Coordinated: Yes Fine Motor Movements are Fluid and Coordinated: Yes  Motor  Motor Motor: Abnormal postural alignment and control Motor - Discharge Observations: chronic slouched sitting  Mobility  Bed Mobility Bed Mobility: Supine to Sit;Sit to Supine Supine to Sit: 6: Modified independent (Device/Increase time) Sit to Supine: 6: Modified independent (Device/Increase time) Transfers Transfers: Sit to Stand;Stand to Sit Sit to Stand: 5: Supervision Sit to Stand Details: Verbal cues for safe use of DME/AE Stand to Sit: 5: Supervision Stand to Sit Details (indicate cue type and reason): Verbal cues for safe use  of DME/AE   Trunk/Postural Assessment  Cervical Assessment Cervical Assessment: Within Functional Limits Thoracic Assessment Thoracic Assessment: Within Functional Limits Lumbar Assessment Lumbar Assessment: Within Functional Limits Postural Control Postural Control: Deficits on evaluation Postural Limitations: chronic slouched sitting posture   Balance Balance Balance Assessed: Yes Static Sitting Balance Static Sitting - Balance Support: Bilateral upper extremity supported Static Sitting - Level of Assistance: 6: Modified independent (Device/Increase time) Dynamic Sitting Balance Dynamic Sitting - Balance Support:  Bilateral upper extremity supported Dynamic Sitting - Level of Assistance: 6: Modified independent (Device/Increase time) Static Standing Balance Static Standing - Balance Support: Bilateral upper extremity supported;During functional activity Static Standing - Level of Assistance: 6: Modified independent (Device/Increase time) Dynamic Standing Balance Dynamic Standing - Balance Support: Bilateral upper extremity supported Dynamic Standing - Level of Assistance: 5: Stand by assistance  Extremity/Trunk Assessment RUE Assessment RUE Assessment: Within Functional Limits LUE Assessment LUE Assessment: Within Functional Limits  See FIM for current functional status  Michell Kader 08/31/2014, 12:38 PM

## 2014-08-31 NOTE — Progress Notes (Signed)
Recreational Therapy Discharge Summary Patient Details  Name: [name] [name] MRN: [id number] Date of Birth: 05/04/1966 Today's Date: 08/31/2014  Long term goals set: 1  Long term goals met: 1  Comments on progress toward goals: Pt is being discharged home today with caregiver to provide 24 hour supervision.  Pt is discharging home at supervision level for simple TR tasks seated with verbal cues for completion and mod assist for standing TR tasks. Reasons for discharge: discharge from hospital Patient/family agrees with progress made and goals achieved: Yes  , 08/31/2014, 11:46 AM     

## 2014-08-31 NOTE — Discharge Instructions (Signed)
Inpatient Rehab Discharge Instructions  [name] LauthRobert [name] Discharge date and time: 08/31/14   Activities/Precautions/ Functional Status: Activity: No strenuous activity.  Diet: diabetic diet Wound Care: Cleanse groin wounds with soap and water, pat dry and keep dry dressing in place.  Cleanse left amputation site with  Functional status:  ___ No restrictions     ___ Walk up steps independently _X__ 24/7 supervision/assistance   ___ Walk up steps with assistance ___ Intermittent supervision/assistance  ___ Bathe/dress independently ___ Walk with walker     _X__ Bathe/dress with assistance ___ Walk Independently    ___ Shower independently ___ Walk with assistance    ___ Shower with assistance _X__ No alcohol     ___ Return to work/school ________    COMMUNITY REFERRALS UPON DISCHARGE:    Home Health:     RN                       Agency: [facility] Phone: [phone]    Medical Equipment/Items Ordered: wheelchair cushion                                                     Agency/Supplier:  [facility] @ [phone]  Other: Referral made to CAP program - Hilliard Clarknna Catnell @ [phone] - she will contact you at home   Special Instructions: 1. Do not use naprosyn, lisinopril, Metformin, glipizide, 70/30 insulin, potassium 2. Note the decrease in  Xanax dose to help decrease sedation. 3. Wean oxycodone further to every 6 hours as needed after about a week.  4. Set appointment with Mental Health for refills.   My questions have been answered and I understand these instructions. I will adhere to these goals and the provided educational materials after my discharge from the hospital.  Patient/Caregiver Signature _______________________________ Date __________  Clinician Signature _______________________________________ Date __________  Please bring this form and your medication list with you to all your follow-up doctor's appointments.

## 2014-08-31 NOTE — Progress Notes (Signed)
Subjective: Interval History: none.. Comfortable. Examined the patient and discussed today in the presence of his girlfriend and Dr. Riley KillSwartz.  Objective: Vital signs in last 24 hours: Temp:  [98 F (36.7 C)-99.1 F (37.3 C)] 98 F (36.7 C) (03/03 0533) Pulse Rate:  [79-84] 79 (03/03 0533) Resp:  [17-18] 18 (03/03 0533) BP: (108-126)/(63-66) 118/64 mmHg (03/03 0533) SpO2:  [98 %-100 %] 100 % (03/02 1326) Weight:  [215 lb 4.5 oz (97.65 kg)] 215 lb 4.5 oz (97.65 kg) (03/03 0533)  Intake/Output from previous day: 03/02 0701 - 03/03 0700 In: 1310 [P.O.:960; IV Piggyback:350] Out: 650 [Urine:650] Intake/Output this shift: Total I/O In: 240 [P.O.:240] Out: -   Comfortable. Alert and oriented. Right axillary and groin incisions are healing quite nicely. His left above-knee amputation is completely nontender. There is no erythema and the suture line is healing perfectly.  Lab Results:  Recent Labs  08/29/14 0848 08/30/14 0850  WBC 18.1* 13.7*  HGB 11.6* 10.7*  HCT 35.7* 33.1*  PLT 398 349   BMET  Recent Labs  08/29/14 0848  NA 136  K 4.4  CL 97  CO2 27  GLUCOSE 211*  BUN 8  CREATININE 1.28  CALCIUM 9.2    Studies/Results: Dg Chest 2 View  08/29/2014   CLINICAL DATA:  Fever.  EXAM: CHEST  2 VIEW  COMPARISON:  08/22/2014  FINDINGS: Lungs are adequately inflated without consolidation or effusion. Borderline stable cardiomegaly. Remainder the exam is unchanged.  IMPRESSION: No active cardiopulmonary disease.   Electronically Signed   By: Elberta Fortisaniel  Boyle M.D.   On: 08/29/2014 12:09   Dg Chest 2 View  08/16/2014   CLINICAL DATA:  Patient for left above-the-knee amputation 08/18/2013.  EXAM: CHEST  2 VIEW  COMPARISON:  Single view of the chest 08/31/2013.  FINDINGS: The lungs are clear. Heart size is upper normal. No pneumothorax pleural effusion.  IMPRESSION: No acute disease.   Electronically Signed   By: Drusilla Kannerhomas  Dalessio M.D.   On: 08/16/2014 20:51   Ct Angio Ao+bifem W/cm  &/or Wo/cm  08/16/2014   CLINICAL DATA:  Evaluate occluded aortobifemoral bypass graft.  EXAM: CT ANGIOGRAPHY OF ABDOMINAL AORTA WITH ILIOFEMORAL RUNOFF  TECHNIQUE: Multidetector CT imaging of the abdomen, pelvis and lower extremities was performed using the standard protocol during bolus administration of intravenous contrast. Multiplanar CT image reconstructions and MIPs were obtained to evaluate the vascular anatomy.  CONTRAST:  100mL OMNIPAQUE IOHEXOL 350 MG/ML SOLN  COMPARISON:  08/30/2013  FINDINGS: Aorta: Visible portions of the distal descending thoracic aorta are normal in caliber and widely patent. The suprarenal abdominal aorta is normal in caliber and widely patent. 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 Both are patent.  Right renal artery:  Patent  Inferior mesenteric artery: Proximal 1-2 cm is occluded. Patent beyond.  Aortobifemoral graft:  Completely occluded  Right iliac: Complete occlusion of the native right common iliac artery. There is internal iliac collateral reconstitution of the right external iliac artery.  Left iliac: There is complete occlusion of the native left common iliac and external iliac arteries. The internal iliac is densely calcified and probably occluded.  Femoral femoral bypass graft:  Completely occluded  Right Lower Extremity: The common femoral, superficial femoral and popliteal arteries are widely patent. Contiguously  patent 3 vessel runoff to the foot.  Left Lower Extremity: Common femoral, superficial femoral and popliteal arteries are occluded. Collateral reconstitution of profunda branches, but the main profunda trunk is occluded. Below knee amputation.  Ancillary findings: Unremarkable arterial phase appearances of the liver, spleen, pancreas and  kidneys. There are unchanged left adrenal nodules measuring up to 2.5 cm. There is a wide-mouth umbilical hernia containing unobstructed small bowel.  Review of the MIP images confirms the above findings.  IMPRESSION: 1. Complete inclusion of the aortobifemoral graft. 2. Complete occlusion of the femoral-femoral graft 3. 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 Complete occlusion of the native common iliac arteries bilaterally. 4. Internal iliac collateral reconstitution of the right external iliac artery. Remainder of the right lower extremity arterial vasculature is widely patent below the inguinal ligament. 5. Complete occlusion of the left common femoral, superficial femoral and popliteal arteries. Collateral reconstitution of profunda branches. 6. Left adrenal nodules, unchanged. 7. Umbilical hernia containing unobstructed small bowel.   Electronically Signed   By: Ellery Plunk M.D.   On: 08/16/2014 22:54   Dg Chest Port 1 View  08/22/2014   CLINICAL DATA:  Pulmonary edema  EXAM: PORTABLE CHEST - 1 VIEW  COMPARISON:  Portable chest x-ray of August 19, 2014  FINDINGS: The lungs are adequately inflated. The interstitial markings remain mildly increased. Retrocardiac infiltrate on the left has decreased. There is no pleural effusion. The cardiac silhouette is mildly enlarged. The central pulmonary vascularity is prominent but less so than on yesterday's study.  IMPRESSION: Findings consistent with improving pulmonary interstitial edema. Left lower lobe atelectasis or pneumonia is improving.   Electronically Signed   By: David  [place]   On: 08/22/2014 07:57   Dg Chest Port 1 View  08/19/2014   CLINICAL DATA:  Fever  EXAM: PORTABLE CHEST - 1 VIEW  COMPARISON:  08/16/2014  FINDINGS: Borderline cardiomegaly. No pulmonary edema. There is hazy bilateral basilar atelectasis or Niasha Devins infiltrate.  IMPRESSION: No pulmonary edema. Hazy bilateral basilar atelectasis or Tuck Dulworth  infiltrate. Borderline cardiomegaly.   Electronically Signed   By: Natasha Mead M.D.   On: 08/19/2014 11:41   Anti-infectives: Anti-infectives    Start     Dose/Rate Route Frequency Ordered Stop   08/29/14 1500  vancomycin (VANCOCIN) 1,250 mg in sodium chloride 0.9 % 250 mL IVPB     1,250 mg 166.7 mL/hr over 90 Minutes Intravenous Every 12 hours 08/29/14 1346     08/29/14 1500  piperacillin-tazobactam (ZOSYN) IVPB 3.375 g     3.375 g 12.5 mL/hr over 240 Minutes Intravenous Every 8 hours 08/29/14 1346     08/24/14 2200  piperacillin-tazobactam (ZOSYN) IVPB 3.375 g  Status:  Discontinued     3.375 g 12.5 mL/hr over 240 Minutes Intravenous 3 times per day 08/24/14 1837 08/25/14 0903      Assessment/Plan: s/p * No surgery found * Table overall. Still unclear as to the source for his fever to 103 several days ago. Has remained stable for 48 hours no fever. Feel completely comfortable with discharge to home today. Patient wants to go home. Do not see any reason to continue antibiotics. Had a long discussion with the girlfriend and the patient regarding signs of infection. They will notify us should this occur. Otherwise we'll see him in 2 weeks for staple removal of the stump   LOS: 7 days   Aaryn Parrilla 08/31/2014, 8:08 AM

## 2014-08-31 NOTE — Plan of Care (Signed)
Problem: RH Bed Mobility Goal: LTG Patient will perform bed mobility with assist (PT) LTG: Patient will perform bed mobility with assistance, with/without cues (PT).  Outcome: Not Met (add Reason) Pt req cues for technique to sit up from bed and encouragement to participate due to mental health related behaviors.   Problem: RH Wheelchair Mobility Goal: LTG Patient will propel w/c in community environment (PT) LTG: Patient will propel wheelchair in community environment, # of feet with assist (PT)  Outcome: Not Met (add Reason) Pt req encouragement to complete 150' distance.

## 2014-08-31 NOTE — Plan of Care (Signed)
Problem: RH SKIN INTEGRITY Goal: RH STG ABLE TO PERFORM INCISION/WOUND CARE W/ASSISTANCE STG Able To Perform Incision/Wound Care With max Assistance.  Outcome: Completed/Met Date Met:  08/31/14 Girlfriend completes incision/wound care

## 2014-08-31 NOTE — Progress Notes (Signed)
Social Work  Discharge Note  The overall goal for the admission was met for:   Discharge location: Yes - home with caregiver, Willette Cluster to provide all assist  Length of Stay: Yes - 7 days  Discharge activity level: Yes - supervision w/c level  Home/community participation: Yes  Services provided included: MD, RD, PT, OT, RN, TR, Pharmacy, Neuropsych and SW  Financial Services: Medicaid  Follow-up services arranged: Home Health: RN via [facility], DME: w/c cushion via [facility] and Patient/Family has no preference for HH/DME agencies  Also referred pt to the [facility] program in [place] and they are to follow up with him within the next couple of days at home.  Comments (or additional information): of note, HHPT was also recommended for this gentleman, however, not covered by Medicaid with this diagnosis and pt unable to pay privately.  Patient/Family verbalized understanding of follow-up arrangements: Yes  Individual responsible for coordination of the follow-up plan: pt  Confirmed correct DME delivered: Zale Marcotte 08/31/2014    Keiosha Cancro

## 2014-08-31 NOTE — Progress Notes (Signed)
Occupational Therapy Session Note  Patient Details  Name: [name] LauthRobert [name] MRN: [id number] Date of Birth: 21-Feb-1966  Today's Date: 08/31/2014 OT Individual Time: [id number] OT Individual Time Calculation (min): 15 min   Short Term Goals: Week 1:  OT Short Term Goal 1 (Week 1): STG=LTG due to anticipated short LOS  Skilled Therapeutic Interventions/Progress Updates: ADL-retraining with focus on caregiver education on home safety needs and falls prevention.   Pt received seated in his w/c with caregiver massaging his right leg.   With verbal prompt, pt demonstrated ability to massage his own leg but relied on caregiver for continued comfort and companionship while awaiting completion of IV treatment.    OT reviewed home safety needs and reiterated recommendation for supervision during tub transfers, dynamic standing and lower body dressing skills d/t occasional confusion/decreased awareness of limb loss.     Pt and caregiver acknowledged recommendations and declined further services d/t imminent discharge.  Therapy Documentation Precautions:  Precautions Precautions: Fall Precaution Comments: L AKA Restrictions Weight Bearing Restrictions: Yes LLE Weight Bearing: Non weight bearing Other Position/Activity Restrictions: No heavy lifting/straining for 4 weeks d/t hernia; R groin wound  Vital Signs: Therapy Vitals Temp: 98 F (36.7 C) Temp Source: Oral Pulse Rate: 81 Resp: 18 BP: 126/67 mmHg  Pain: Pain Assessment Pain Assessment: 0-10 Pain Score: 7  Pain Type: Acute pain Pain Location: Leg Pain Orientation: Left Pain Descriptors / Indicators: Aching;Burning Pain Frequency: Constant Pain Onset: On-going Patients Stated Pain Goal: 3 Pain Intervention(s): Medication (See eMAR);Repositioned  ADL: ADL ADL Comments: see FIM  See FIM for current functional status  Therapy/Group: Individual Therapy  Anetra Czerwinski 08/31/2014, 9:26 AM

## 2014-08-31 NOTE — Progress Notes (Signed)
Social Work Patient ID: [name] [name], male   DOB: 11/11/1965, 49 y.o.   MRN: [id number]  Met with pt and caregiver this morning and they are aware that MD has medically cleared him for d/c today.  Making DME and HH f/u arrangements.  Hason Ofarrell, LCSW

## 2014-09-04 LAB — CULTURE, BLOOD (ROUTINE X 2)
CULTURE: NO GROWTH
Culture: NO GROWTH

## 2014-09-06 LAB — CK ISOENZYMES
CK MB: 0 % (ref <5)
CK MM: 100 % (ref 95–100)
CK TOTAL: 217 U/L (ref 7–232)
CK-BB: 0 %
Creatine Kinase-Total: 217 U/L — ABNORMAL HIGH (ref 44–196)

## 2014-09-15 DIAGNOSIS — F319 Bipolar disorder, unspecified: Secondary | ICD-10-CM

## 2014-09-15 DIAGNOSIS — F17200 Nicotine dependence, unspecified, uncomplicated: Secondary | ICD-10-CM

## 2014-09-15 DIAGNOSIS — I739 Peripheral vascular disease, unspecified: Secondary | ICD-10-CM

## 2014-09-15 DIAGNOSIS — F329 Major depressive disorder, single episode, unspecified: Secondary | ICD-10-CM

## 2014-09-15 DIAGNOSIS — F1020 Alcohol dependence, uncomplicated: Secondary | ICD-10-CM

## 2014-09-15 DIAGNOSIS — Z89612 Acquired absence of left leg above knee: Secondary | ICD-10-CM | POA: Diagnosis not present

## 2014-09-15 DIAGNOSIS — I10 Essential (primary) hypertension: Secondary | ICD-10-CM | POA: Diagnosis not present

## 2014-09-15 DIAGNOSIS — Z4781 Encounter for orthopedic aftercare following surgical amputation: Secondary | ICD-10-CM | POA: Diagnosis not present

## 2014-09-15 DIAGNOSIS — E119 Type 2 diabetes mellitus without complications: Secondary | ICD-10-CM | POA: Diagnosis not present

## 2014-09-18 ENCOUNTER — Encounter: Payer: Self-pay | Admitting: Vascular Surgery

## 2014-09-19 ENCOUNTER — Ambulatory Visit (INDEPENDENT_AMBULATORY_CARE_PROVIDER_SITE_OTHER): Payer: Self-pay | Admitting: Vascular Surgery

## 2014-09-19 ENCOUNTER — Encounter: Payer: Self-pay | Admitting: Vascular Surgery

## 2014-09-19 VITALS — BP 161/89 | HR 67 | Ht 74.0 in | Wt 215.0 lb

## 2014-09-19 DIAGNOSIS — I739 Peripheral vascular disease, unspecified: Secondary | ICD-10-CM

## 2014-09-19 MED ORDER — FENTANYL 12 MCG/HR TD PT72: 12.5000 ug | MEDICATED_PATCH | TRANSDERMAL | Status: DC

## 2014-09-19 NOTE — Progress Notes (Addendum)
POST OPERATIVE OFFICE NOTE    CC:  F/u for surgery  HPI:  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  He had a complicated hospital course and eventually was discharged to CIR.  He did quite well and returns today for follow up.  He states that all his incisions have healed.  He still has pain in his stump.  His last Fentanyl patch was used and thrown away yesterday.    No Known Allergies  Current Outpatient Prescriptions  Medication Sig Dispense Refill  . ALPRAZolam (XANAX) 1 MG tablet Take 0.5 tablets (0.5 mg total) by mouth 3 (three) times daily. 90 tablet 0  . amphetamine-dextroamphetamine (ADDERALL) 10 MG tablet Take 2 tablets (20 mg total) by mouth daily with breakfast. 60 tablet 0  . aspirin 325 MG EC tablet Take 325 mg by mouth daily.    Marland Kitchen. atenolol (TENORMIN) 50 MG tablet Take 1 tablet (50 mg total) by mouth 2 (two) times daily.    Marland Kitchen. atorvastatin (LIPITOR) 40 MG tablet Take 40 mg by mouth every morning.    Marland Kitchen. buPROPion (WELLBUTRIN) 100 MG tablet Take 1 tablet (100 mg total) by mouth 2 (two) times daily.    . feeding supplement, ENSURE, (ENSURE) PUDG Take 1 Container by mouth 3 (three) times daily with meals.  0  . fentaNYL (DURAGESIC - DOSED MCG/HR) 12 MCG/HR Place 1 patch (12.5 mcg total) onto the skin every 3 (three) days. 5 patch 0  . folic acid (FOLVITE) 1 MG tablet Take 1 tablet (1 mg total) by mouth daily. 30 tablet 1  . gabapentin (NEURONTIN) 300 MG capsule Take 1 capsule (300 mg total) by mouth 3 (three) times daily. 90 capsule 1  . hydrochlorothiazide (HYDRODIURIL) 50 MG tablet Take 0.5 tablets (25 mg total) by mouth daily.    . insulin glargine (LANTUS) 100 UNIT/ML injection Inject 0.4 mLs (40 Units total) into the skin at bedtime. 10 mL 11  . iron polysaccharides (NIFEREX) 150 MG capsule Take 1 capsule (150 mg total)  by mouth 2 times daily at 12 noon and 4 pm. 60 capsule 0  . Multiple Vitamin (MULTIVITAMIN WITH MINERALS) TABS tablet Take 1 tablet by mouth daily.    . nicotine (NICODERM CQ - DOSED IN MG/24 HOURS) 21 mg/24hr patch Use 21 mg for two weeks. Then 14 mg patch daily x4 weeks then 7 mg patch daily x 4 weeks. Then stop 14 patch 0  . omeprazole (PRILOSEC) 20 MG capsule Take 20 mg by mouth daily.    Marland Kitchen. oxyCODONE 10 MG TABS Take 1 tablet (10 mg total) by mouth every 4 (four) hours as needed for breakthrough pain. 120 tablet 0  . QUEtiapine (SEROQUEL) 50 MG tablet Take 1 tablet (50 mg total) by mouth 3 (three) times daily. 90 tablet 1  . thiamine 100 MG tablet Take 1 tablet (100 mg total) by mouth daily. 30 tablet 0  . traMADol (ULTRAM) 50 MG tablet Take 1 tablet (50 mg total) by mouth every 6 (six) hours as needed for moderate pain. 90 tablet 0   No current facility-administered medications for this visit.     ROS:  See HPI  Physical Exam:  Filed Vitals:   09/19/14 1611  BP: 161/89  Pulse: 67    Incision:  All incisions have healed very nicely.  Left AKA stump with staples in tact and healed. Extremities:  2+ right DP/PT; right foot warm.  Dry gangrenous changes to right great toe; no edema RLE   Assessment/Plan:  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   -pt is doing quite well.  He does have 2+ palpable right DP/PT as well as graft pulse.   -he continues to exercise his left stump and is mobilizing well -his right great toe continues to have dry gangrenous changes that will continue to improve over time with restored blood flow -his stump is well healed and we will remove his staples today -his girlfriend inquires about compression sock for his right leg and this is advised against.  -he is given Rx  for Fentanyl patch q3d #7 no refill -advised to wean Oxycodone and go to q6h -he is praised for smoking cessation -we will see him back in 6 weeks for re check.   Doreatha Massed, PA-C [facility] [phone]  Clinic MD:  Pt seen and examined with Dr. Arbie Cookey  I have examined the patient, reviewed and agree with above.  [name], TODD, MD 09/19/2014 5:13 PM

## 2014-10-18 ENCOUNTER — Inpatient Hospital Stay: Payer: Self-pay | Admitting: Physical Medicine & Rehabilitation

## 2014-10-31 ENCOUNTER — Ambulatory Visit: Payer: Self-pay | Admitting: Vascular Surgery

## 2014-11-03 ENCOUNTER — Encounter: Payer: Self-pay | Admitting: Vascular Surgery

## 2014-11-07 ENCOUNTER — Ambulatory Visit: Payer: Medicaid Other | Admitting: Vascular Surgery

## 2014-11-08 ENCOUNTER — Encounter: Payer: Medicaid Other | Attending: Physical Medicine & Rehabilitation | Admitting: Physical Medicine & Rehabilitation

## 2014-11-17 ENCOUNTER — Encounter: Payer: Self-pay | Admitting: Vascular Surgery

## 2014-11-21 ENCOUNTER — Ambulatory Visit (HOSPITAL_COMMUNITY)
Admission: RE | Admit: 2014-11-21 | Discharge: 2014-11-21 | Disposition: A | Discharge status: Home / Self Care | Payer: Medicaid Other | Source: Ambulatory Visit | Attending: Vascular Surgery | Admitting: Vascular Surgery

## 2014-11-21 ENCOUNTER — Encounter: Payer: Self-pay | Admitting: Vascular Surgery

## 2014-11-21 ENCOUNTER — Ambulatory Visit (INDEPENDENT_AMBULATORY_CARE_PROVIDER_SITE_OTHER): Payer: Medicaid Other | Admitting: Vascular Surgery

## 2014-11-21 ENCOUNTER — Ambulatory Visit (INDEPENDENT_AMBULATORY_CARE_PROVIDER_SITE_OTHER)
Admission: RE | Admit: 2014-11-21 | Discharge: 2014-11-21 | Disposition: A | Discharge status: Home / Self Care | Payer: Medicaid Other | Source: Ambulatory Visit | Attending: Vascular Surgery | Admitting: Vascular Surgery

## 2014-11-21 ENCOUNTER — Encounter (HOSPITAL_COMMUNITY): Payer: Self-pay | Admitting: *Deleted

## 2014-11-21 ENCOUNTER — Other Ambulatory Visit: Payer: Self-pay

## 2014-11-21 VITALS — BP 159/99 | HR 67 | Resp 18 | Ht 74.0 in | Wt 219.0 lb

## 2014-11-21 DIAGNOSIS — I96 Gangrene, not elsewhere classified: Secondary | ICD-10-CM | POA: Insufficient documentation

## 2014-11-21 DIAGNOSIS — S91109D Unspecified open wound of unspecified toe(s) without damage to nail, subsequent encounter: Secondary | ICD-10-CM

## 2014-11-21 DIAGNOSIS — Z89612 Acquired absence of left leg above knee: Secondary | ICD-10-CM

## 2014-11-21 DIAGNOSIS — Z01818 Encounter for other preprocedural examination: Secondary | ICD-10-CM | POA: Insufficient documentation

## 2014-11-21 DIAGNOSIS — Z87891 Personal history of nicotine dependence: Secondary | ICD-10-CM

## 2014-11-21 DIAGNOSIS — I10 Essential (primary) hypertension: Secondary | ICD-10-CM | POA: Insufficient documentation

## 2014-11-21 DIAGNOSIS — E785 Hyperlipidemia, unspecified: Secondary | ICD-10-CM

## 2014-11-21 DIAGNOSIS — I70201 Unspecified atherosclerosis of native arteries of extremities, right leg: Secondary | ICD-10-CM

## 2014-11-21 DIAGNOSIS — I739 Peripheral vascular disease, unspecified: Secondary | ICD-10-CM

## 2014-11-21 DIAGNOSIS — E119 Type 2 diabetes mellitus without complications: Secondary | ICD-10-CM

## 2014-11-21 LAB — CREATININE, SERUM
Creatinine, Ser: 0.95 mg/dL (ref 0.61–1.24)
GFR calc Af Amer: >60 mL/min (ref >60)
GFR calc non Af Amer: >60 mL/min (ref >60)

## 2014-11-21 LAB — BUN: BUN: 8 mg/dL (ref 6–20)

## 2014-11-21 MED ORDER — IOHEXOL 350 MG/ML SOLN
100.0000 mL | Freq: Once | INTRAVENOUS | Status: AC | PRN
  Administered 2014-11-21: 100 mL via INTRAVENOUS

## 2014-11-21 NOTE — Progress Notes (Signed)
Pt SDW pre-op call completed by both pt and pt significant other Lanette,  with pt consent. Pt denies SOB, chest pain and being under the care of a cardiologist. Pt denies having a cardiac cath and stress test. Pt made aware to not take any diabetic medications ( metformin and insulin ) on the morning of procedure. Pt made aware to stop taking otc vitamins, herbal medications and NSAIDS. Both pt and Lanette verbalized understanding of pre-op instructions.

## 2014-11-21 NOTE — Progress Notes (Signed)
Patient resents today for follow-up of his extensive past history. He had undergone prior aortobifemoral bypass by Dr. fields in 2013. He suffered a occlusion of one limb of this and underwent thrombectomy in femorofemoral bypass. Eventually underwent left below-knee amputation. 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 He underwent ask femoral and femoral-femoral bypass by myself in February 2016. He had progressive gangrenous changes of his left leg and underwent left above-knee amputation with very nice healing of this in February as well. He been seen again in March of this year at which time he had palpable dorsalis pedis pulse in the left and was making nice recovery. He presents today for continued follow-up. He has had a change in his right foot. He reports that over the weekend 2-3 days ago he had severe nausea and vomiting and noted a change in his foot. He reports Cramping in his right calf and feels that his foot is cooler than usual. He does have motor and sensory function in his right foot.  Past Medical History  Diagnosis Date  . GERD (gastroesophageal reflux disease)   . Nicotine addiction   . ETOH abuse   . Anxiety   . Hypertension     no pcp     saw peter nisham once  . Peripheral vascular disease   . Hyperlipidemia   . Collagen vascular disease   . Type II diabetes mellitus   . History of blood transfusion 08/2013    "maybe when he had his amputation"  . 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 Chronic lower back pain   . Depression   . Bipolar disorder     History  Substance Use Topics  . Smoking status: Former Smoker -- 0.12 packs/day for 18 years    Types: Cigarettes    Quit date: 09/12/2014  . Smokeless tobacco: Never Used  . Alcohol Use: No     Comment: 08/15/2014 "stopped drinking NYE 2015; used to drink 1/2 gallon vodka/d     Family History  Problem Relation  Age of Onset  . Hypertension Mother     No Known Allergies   Current outpatient prescriptions:  .  ALPRAZolam (XANAX) 1 MG tablet, Take 0.5 tablets (0.5 mg total) by mouth 3 (three) times daily., Disp: 90 tablet, Rfl: 0 .  amphetamine-dextroamphetamine (ADDERALL) 10 MG tablet, Take 2 tablets (20 mg total) by mouth daily with breakfast., Disp: 60 tablet, Rfl: 0 .  aspirin 325 MG EC tablet, Take 325 mg by mouth daily., Disp: , Rfl:  .  atenolol (TENORMIN) 50 MG tablet, Take 1 tablet (50 mg total) by mouth 2 (two) times daily., Disp: , Rfl:  .  atorvastatin (LIPITOR) 40 MG tablet, Take 40 mg by mouth every morning., Disp: , Rfl:  .  buPROPion (WELLBUTRIN) 100 MG tablet, Take 1 tablet (100 mg total) by mouth 2 (two) times daily., Disp: , Rfl:  .  CLONIDINE HCL PO, Take by mouth. Patient and spouse do not know the dosage, Disp: , Rfl:  .  folic acid (FOLVITE) 1 MG tablet, Take 1 tablet (1 mg total) by mouth daily., Disp: 30 tablet, Rfl: 1 .  gabapentin (NEURONTIN) 300 MG capsule, Take 1 capsule (300 mg total) by mouth 3 (three) times daily., Disp: 90 capsule, Rfl: 1 .  hydrochlorothiazide (HYDRODIURIL) 50 MG tablet, Take 0.5 tablets (25 mg total) by mouth daily., Disp: ,   Rfl:  .  insulin glargine (LANTUS) 100 UNIT/ML injection, Inject 0.4 mLs (40 Units total) into the skin at bedtime., Disp: 10 mL, Rfl: 11 .  lisinopril (PRINIVIL,ZESTRIL) 20 MG tablet, Take 20 mg by mouth 2 (two) times daily., Disp: , Rfl:  .  Multiple Vitamin (MULTIVITAMIN WITH MINERALS) TABS tablet, Take 1 tablet by mouth daily., Disp: , Rfl:  .  omeprazole (PRILOSEC) 20 MG capsule, Take 20 mg by mouth daily., Disp: , Rfl:  .  oxyCODONE 10 MG TABS, Take 1 tablet (10 mg total) by mouth every 4 (four) hours as needed for breakthrough pain., Disp: 120 tablet, Rfl: 0 .  QUEtiapine (SEROQUEL) 50 MG tablet, Take 1 tablet (50 mg total) by mouth 3 (three) times daily., Disp: 90 tablet, Rfl: 1 .  traMADol (ULTRAM) 50 MG tablet, Take 1  tablet (50 mg total) by mouth every 6 (six) hours as needed for moderate pain., Disp: 90 tablet, Rfl: 0 .  feeding supplement, ENSURE, (ENSURE) PUDG, Take 1 Container by mouth 3 (three) times daily with meals. (Patient not taking: Reported on 11/21/2014), Disp: , Rfl: 0 .  fentaNYL (DURAGESIC - DOSED MCG/HR) 12 MCG/HR, Place 1 patch (12.5 mcg total) onto the skin every 3 (three) days. (Patient not taking: Reported on 11/21/2014), Disp: 7 patch, Rfl: 0 .  iron polysaccharides (NIFEREX) 150 MG capsule, Take 1 capsule (150 mg total) by mouth 2 times daily at 12 noon and 4 pm. (Patient not taking: Reported on 11/21/2014), Disp: 60 capsule, Rfl: 0 .  nicotine (NICODERM CQ - DOSED IN MG/24 HOURS) 21 mg/24hr patch, Use 21 mg for two weeks. Then 14 mg patch daily x4 weeks then 7 mg patch daily x 4 weeks. Then stop (Patient not taking: Reported on 11/21/2014), Disp: 14 patch, Rfl: 0 .  thiamine 100 MG tablet, Take 1 tablet (100 mg total) by mouth daily. (Patient not taking: Reported on 11/21/2014), Disp: 30 tablet, Rfl: 0  Filed Vitals:   11/21/14 1032 11/21/14 1034  BP: 176/96 159/99  Pulse: 69 67  Resp: 18   Height: 6' 2" (1.88 m)   Weight: 219 lb (99.338 kg)     Body mass index is 28.11 kg/(m^2).       On physical exam is well-developed gentleman in no acute distress sitting in a wheelchair. He does have a well-healed axillary incision and has easily palpable accident in femorofemoral bypass pulse. I do not palpate a popliteal or pedal pulse on the right. He has a completely healed above-knee amputation on the left.  He underwent duplex today and this shows an ankle arm index of 0.53 on the right. June shows occlusion of his popliteal artery and possible tibial vessels as well. His axillofemoral and femorofemoral bypass are patent by duplex.  Impression and plan: New occlusion of right popliteal artery. This was widely patent on his CT angiogram with runoff in February 2016. Although he does not have  a profound ischemia, I would recommend treatment to not "lose ground" regarding his arterial flow. Again discussed with the patient and his family on usual this was to have continued recurrent thrombotic events. I did discuss the potential long-term anticoagulation following resolution of his acute illness. We have scheduled him for a CT angiogram to evaluate his right leg more thoroughly and plan for revascularization tomorrow at [facility]. 

## 2014-11-21 NOTE — Progress Notes (Signed)
Filed Vitals:   11/21/14 1032 11/21/14 1034  BP: 176/96 159/99  Pulse: 69 67  Resp: 18   Height: 6\' 2"  (1.88 m)   Weight: 219 lb (99.338 kg)    Body mass index is 28.11 kg/(m^2).

## 2014-11-22 ENCOUNTER — Encounter (HOSPITAL_COMMUNITY): Payer: Self-pay | Admitting: *Deleted

## 2014-11-22 ENCOUNTER — Inpatient Hospital Stay (HOSPITAL_COMMUNITY)
Admission: RE | Admit: 2014-11-22 | Discharge: 2014-11-24 | DRG: 253 | Disposition: A | Discharge status: Home / Self Care | Payer: Medicaid Other | Source: Ambulatory Visit | Attending: Vascular Surgery | Admitting: Vascular Surgery

## 2014-11-22 ENCOUNTER — Inpatient Hospital Stay (HOSPITAL_COMMUNITY): Payer: Medicaid Other | Admitting: Anesthesiology

## 2014-11-22 ENCOUNTER — Encounter (HOSPITAL_COMMUNITY)
Admission: RE | Disposition: A | Discharge status: Home / Self Care | Payer: Self-pay | Source: Ambulatory Visit | Attending: Vascular Surgery

## 2014-11-22 DIAGNOSIS — M359 Systemic involvement of connective tissue, unspecified: Secondary | ICD-10-CM | POA: Diagnosis present

## 2014-11-22 DIAGNOSIS — Z89612 Acquired absence of left leg above knee: Secondary | ICD-10-CM | POA: Diagnosis not present

## 2014-11-22 DIAGNOSIS — K219 Gastro-esophageal reflux disease without esophagitis: Secondary | ICD-10-CM | POA: Diagnosis present

## 2014-11-22 DIAGNOSIS — Z7982 Long term (current) use of aspirin: Secondary | ICD-10-CM | POA: Diagnosis not present

## 2014-11-22 DIAGNOSIS — Z794 Long term (current) use of insulin: Secondary | ICD-10-CM

## 2014-11-22 DIAGNOSIS — Z8673 Personal history of transient ischemic attack (TIA), and cerebral infarction without residual deficits: Secondary | ICD-10-CM | POA: Diagnosis not present

## 2014-11-22 DIAGNOSIS — Y832 Surgical operation with anastomosis, bypass or graft as the cause of abnormal reaction of the patient, or of later complication, without mention of misadventure at the time of the procedure: Secondary | ICD-10-CM | POA: Diagnosis present

## 2014-11-22 DIAGNOSIS — I10 Essential (primary) hypertension: Secondary | ICD-10-CM | POA: Diagnosis present

## 2014-11-22 DIAGNOSIS — Z87891 Personal history of nicotine dependence: Secondary | ICD-10-CM | POA: Diagnosis not present

## 2014-11-22 DIAGNOSIS — Z79899 Other long term (current) drug therapy: Secondary | ICD-10-CM | POA: Diagnosis not present

## 2014-11-22 DIAGNOSIS — F411 Generalized anxiety disorder: Secondary | ICD-10-CM | POA: Diagnosis present

## 2014-11-22 DIAGNOSIS — I739 Peripheral vascular disease, unspecified: Secondary | ICD-10-CM | POA: Diagnosis present

## 2014-11-22 DIAGNOSIS — F319 Bipolar disorder, unspecified: Secondary | ICD-10-CM | POA: Diagnosis present

## 2014-11-22 DIAGNOSIS — I252 Old myocardial infarction: Secondary | ICD-10-CM

## 2014-11-22 DIAGNOSIS — I743 Embolism and thrombosis of arteries of the lower extremities: Secondary | ICD-10-CM | POA: Diagnosis present

## 2014-11-22 DIAGNOSIS — E1165 Type 2 diabetes mellitus with hyperglycemia: Secondary | ICD-10-CM | POA: Diagnosis present

## 2014-11-22 DIAGNOSIS — E785 Hyperlipidemia, unspecified: Secondary | ICD-10-CM | POA: Diagnosis present

## 2014-11-22 DIAGNOSIS — E1152 Type 2 diabetes mellitus with diabetic peripheral angiopathy with gangrene: Secondary | ICD-10-CM | POA: Diagnosis present

## 2014-11-22 DIAGNOSIS — T82868A Thrombosis of vascular prosthetic devices, implants and grafts, initial encounter: Secondary | ICD-10-CM | POA: Diagnosis present

## 2014-11-22 HISTORY — PX: THROMBECTOMY FEMORAL ARTERY: SHX6406

## 2014-11-22 HISTORY — DX: Peripheral vascular disease, unspecified: I73.9

## 2014-11-22 LAB — CBC
HCT: 38.9 % — ABNORMAL LOW (ref 39.0–52.0)
Hemoglobin: 12.8 g/dL — ABNORMAL LOW (ref 13.0–17.0)
MCH: 27.5 pg (ref 26.0–34.0)
MCHC: 32.9 g/dL (ref 30.0–36.0)
MCV: 83.7 fL (ref 78.0–100.0)
Platelets: 187 10*3/uL (ref 150–400)
RBC: 4.65 MIL/uL (ref 4.22–5.81)
RDW: 14.9 % (ref 11.5–15.5)
WBC: 7.7 10*3/uL (ref 4.0–10.5)

## 2014-11-22 LAB — GRAM STAIN

## 2014-11-22 LAB — COMPREHENSIVE METABOLIC PANEL
ALK PHOS: 70 U/L (ref 38–126)
ALT: 36 U/L (ref 17–63)
AST: 43 U/L — ABNORMAL HIGH (ref 15–41)
Albumin: 3.8 g/dL (ref 3.5–5.0)
Anion gap: 11 (ref 5–15)
BILIRUBIN TOTAL: 0.8 mg/dL (ref 0.3–1.2)
BUN: 13 mg/dL (ref 6–20)
CO2: 26 mmol/L (ref 22–32)
CREATININE: 0.96 mg/dL (ref 0.61–1.24)
Calcium: 9.4 mg/dL (ref 8.9–10.3)
Chloride: 102 mmol/L (ref 101–111)
GFR calc Af Amer: >60 mL/min (ref >60)
GFR calc non Af Amer: >60 mL/min (ref >60)
Glucose, Bld: 129 mg/dL — ABNORMAL HIGH (ref 65–99)
POTASSIUM: 3.6 mmol/L (ref 3.5–5.1)
SODIUM: 139 mmol/L (ref 135–145)
Total Protein: 7 g/dL (ref 6.5–8.1)

## 2014-11-22 LAB — GLUCOSE, CAPILLARY
GLUCOSE-CAPILLARY: 136 mg/dL — AB (ref 65–99)
GLUCOSE-CAPILLARY: 107 mg/dL — AB (ref 65–99)
GLUCOSE-CAPILLARY: 114 mg/dL — AB (ref 65–99)
Glucose-Capillary: 112 mg/dL — ABNORMAL HIGH (ref 65–99)
Glucose-Capillary: 245 mg/dL — ABNORMAL HIGH (ref 65–99)

## 2014-11-22 LAB — PROTIME-INR
INR: 1.08 (ref 0.00–1.49)
PROTHROMBIN TIME: 14.2 s (ref 11.6–15.2)

## 2014-11-22 LAB — APTT: aPTT: 26 seconds (ref 24–37)

## 2014-11-22 LAB — TYPE AND SCREEN
ABO/RH(D): A POS
Antibody Screen: NEGATIVE

## 2014-11-22 LAB — SURGICAL PCR SCREEN
MRSA, PCR: NEGATIVE
STAPHYLOCOCCUS AUREUS: NEGATIVE

## 2014-11-22 SURGERY — THROMBECTOMY, ARTERY, FEMORAL
Anesthesia: General | Site: Leg Lower | Laterality: Right

## 2014-11-22 MED ORDER — ATENOLOL 50 MG PO TABS
50.0000 mg | ORAL_TABLET | Freq: Two times a day (BID) | ORAL | Status: DC
  Administered 2014-11-22 – 2014-11-24 (×4): 50 mg via ORAL
  Filled 2014-11-22 (×5): qty 1

## 2014-11-22 MED ORDER — POTASSIUM CHLORIDE CRYS ER 20 MEQ PO TBCR: 20.0000 meq | EXTENDED_RELEASE_TABLET | Freq: Every day | ORAL | Status: DC | PRN

## 2014-11-22 MED ORDER — ONDANSETRON HCL 4 MG/2ML IJ SOLN
INTRAMUSCULAR | Status: AC
  Filled 2014-11-22: qty 2

## 2014-11-22 MED ORDER — ASPIRIN EC 325 MG PO TBEC
325.0000 mg | DELAYED_RELEASE_TABLET | Freq: Every day | ORAL | Status: DC
  Administered 2014-11-23 – 2014-11-24 (×2): 325 mg via ORAL
  Filled 2014-11-22 (×2): qty 1

## 2014-11-22 MED ORDER — POTASSIUM CHLORIDE ER 10 MEQ PO TBCR
10.0000 meq | EXTENDED_RELEASE_TABLET | Freq: Every day | ORAL | Status: DC
  Administered 2014-11-22 – 2014-11-24 (×3): 10 meq via ORAL
  Filled 2014-11-22 (×3): qty 1

## 2014-11-22 MED ORDER — SODIUM CHLORIDE 0.9 % IV SOLN
INTRAVENOUS | Status: DC
  Administered 2014-11-22 – 2014-11-24 (×2): via INTRAVENOUS

## 2014-11-22 MED ORDER — CHLORHEXIDINE GLUCONATE CLOTH 2 % EX PADS: 6.0000 | MEDICATED_PAD | Freq: Once | CUTANEOUS | Status: DC

## 2014-11-22 MED ORDER — GUAIFENESIN-DM 100-10 MG/5ML PO SYRP: 15.0000 mL | ORAL_SOLUTION | ORAL | Status: DC | PRN

## 2014-11-22 MED ORDER — FENTANYL CITRATE (PF) 250 MCG/5ML IJ SOLN
INTRAMUSCULAR | Status: AC
  Filled 2014-11-22: qty 5

## 2014-11-22 MED ORDER — TRAMADOL HCL 50 MG PO TABS
50.0000 mg | ORAL_TABLET | Freq: Four times a day (QID) | ORAL | Status: DC | PRN
  Administered 2014-11-24: 50 mg via ORAL
  Filled 2014-11-22: qty 1

## 2014-11-22 MED ORDER — DEXTROSE 5 % IV SOLN
1.5000 g | Freq: Two times a day (BID) | INTRAVENOUS | Status: AC
  Administered 2014-11-23 (×2): 1.5 g via INTRAVENOUS
  Filled 2014-11-22 (×2): qty 1.5

## 2014-11-22 MED ORDER — SUCCINYLCHOLINE CHLORIDE 20 MG/ML IJ SOLN
INTRAMUSCULAR | Status: DC | PRN
  Administered 2014-11-22: 120 mg via INTRAVENOUS

## 2014-11-22 MED ORDER — QUETIAPINE FUMARATE 100 MG PO TABS
100.0000 mg | ORAL_TABLET | Freq: Three times a day (TID) | ORAL | Status: DC
  Administered 2014-11-22 – 2014-11-23 (×3): 100 mg via ORAL
  Filled 2014-11-22 (×7): qty 1

## 2014-11-22 MED ORDER — CLONIDINE HCL 0.1 MG PO TABS
0.1000 mg | ORAL_TABLET | Freq: Two times a day (BID) | ORAL | Status: DC
  Administered 2014-11-22 – 2014-11-24 (×4): 0.1 mg via ORAL
  Filled 2014-11-22 (×5): qty 1

## 2014-11-22 MED ORDER — PANTOPRAZOLE SODIUM 40 MG PO TBEC
40.0000 mg | DELAYED_RELEASE_TABLET | Freq: Every day | ORAL | Status: DC
  Administered 2014-11-23 – 2014-11-24 (×2): 40 mg via ORAL
  Filled 2014-11-22 (×2): qty 1

## 2014-11-22 MED ORDER — ACETAMINOPHEN 325 MG PO TABS: 325.0000 mg | ORAL_TABLET | ORAL | Status: DC | PRN

## 2014-11-22 MED ORDER — 0.9 % SODIUM CHLORIDE (POUR BTL) OPTIME
TOPICAL | Status: DC | PRN
  Administered 2014-11-22 (×2): 1000 mL

## 2014-11-22 MED ORDER — INSULIN GLARGINE 100 UNIT/ML ~~LOC~~ SOLN
40.0000 [IU] | Freq: Every day | SUBCUTANEOUS | Status: DC
  Administered 2014-11-22 – 2014-11-23 (×2): 40 [IU] via SUBCUTANEOUS
  Filled 2014-11-22 (×3): qty 0.4

## 2014-11-22 MED ORDER — MORPHINE SULFATE 2 MG/ML IJ SOLN
2.0000 mg | INTRAMUSCULAR | Status: DC | PRN
Start: 2014-11-22 — End: 2014-11-24
  Administered 2014-11-22: 4 mg via INTRAVENOUS
  Administered 2014-11-22: 2 mg via INTRAVENOUS
  Administered 2014-11-22 – 2014-11-23 (×2): 4 mg via INTRAVENOUS
  Filled 2014-11-22 (×3): qty 2

## 2014-11-22 MED ORDER — MORPHINE SULFATE 2 MG/ML IJ SOLN
INTRAMUSCULAR | Status: AC
  Administered 2014-11-22: 4 mg via INTRAVENOUS
  Filled 2014-11-22: qty 1

## 2014-11-22 MED ORDER — OXYCODONE HCL 5 MG PO TABS
5.0000 mg | ORAL_TABLET | ORAL | Status: DC | PRN
  Administered 2014-11-23: 10 mg via ORAL
  Filled 2014-11-22: qty 2

## 2014-11-22 MED ORDER — ALUM & MAG HYDROXIDE-SIMETH 200-200-20 MG/5ML PO SUSP: 15.0000 mL | ORAL | Status: DC | PRN

## 2014-11-22 MED ORDER — HEPARIN (PORCINE) IN NACL 100-0.45 UNIT/ML-% IJ SOLN
1700.0000 [IU]/h | INTRAMUSCULAR | Status: AC
  Administered 2014-11-22: 1500 [IU]/h via INTRAVENOUS
  Administered 2014-11-23: 1700 [IU]/h via INTRAVENOUS
  Filled 2014-11-22 (×2): qty 250

## 2014-11-22 MED ORDER — PROPOFOL 10 MG/ML IV BOLUS
INTRAVENOUS | Status: DC | PRN
  Administered 2014-11-22: 200 mg via INTRAVENOUS

## 2014-11-22 MED ORDER — LABETALOL HCL 5 MG/ML IV SOLN
10.0000 mg | INTRAVENOUS | Status: DC | PRN
  Administered 2014-11-23: 10 mg via INTRAVENOUS
  Filled 2014-11-22: qty 4

## 2014-11-22 MED ORDER — LIDOCAINE HCL (CARDIAC) 20 MG/ML IV SOLN
INTRAVENOUS | Status: AC
  Filled 2014-11-22: qty 5

## 2014-11-22 MED ORDER — HYDROCHLOROTHIAZIDE 50 MG PO TABS
50.0000 mg | ORAL_TABLET | Freq: Every day | ORAL | Status: DC
  Administered 2014-11-22 – 2014-11-24 (×3): 50 mg via ORAL
  Filled 2014-11-22 (×3): qty 1

## 2014-11-22 MED ORDER — DOCUSATE SODIUM 100 MG PO CAPS
100.0000 mg | ORAL_CAPSULE | Freq: Every day | ORAL | Status: DC
  Administered 2014-11-23 – 2014-11-24 (×2): 100 mg via ORAL
  Filled 2014-11-22 (×2): qty 1

## 2014-11-22 MED ORDER — ATORVASTATIN CALCIUM 40 MG PO TABS
40.0000 mg | ORAL_TABLET | Freq: Every morning | ORAL | Status: DC
  Administered 2014-11-22 – 2014-11-24 (×3): 40 mg via ORAL
  Filled 2014-11-22 (×3): qty 1

## 2014-11-22 MED ORDER — ONDANSETRON HCL 4 MG/2ML IJ SOLN
INTRAMUSCULAR | Status: DC | PRN
  Administered 2014-11-22: 4 mg via INTRAVENOUS

## 2014-11-22 MED ORDER — FENTANYL CITRATE (PF) 100 MCG/2ML IJ SOLN
INTRAMUSCULAR | Status: DC | PRN
  Administered 2014-11-22: 100 ug via INTRAVENOUS
  Administered 2014-11-22: 50 ug via INTRAVENOUS
  Administered 2014-11-22 (×2): 100 ug via INTRAVENOUS

## 2014-11-22 MED ORDER — SENNOSIDES-DOCUSATE SODIUM 8.6-50 MG PO TABS
1.0000 | ORAL_TABLET | Freq: Every evening | ORAL | Status: DC | PRN
  Filled 2014-11-22: qty 1

## 2014-11-22 MED ORDER — BUPROPION HCL 100 MG PO TABS
100.0000 mg | ORAL_TABLET | Freq: Two times a day (BID) | ORAL | Status: DC
  Administered 2014-11-22 – 2014-11-24 (×4): 100 mg via ORAL
  Filled 2014-11-22 (×5): qty 1

## 2014-11-22 MED ORDER — OXYCODONE HCL 5 MG PO TABS
10.0000 mg | ORAL_TABLET | ORAL | Status: DC | PRN
  Administered 2014-11-23 – 2014-11-24 (×5): 10 mg via ORAL
  Filled 2014-11-22 (×5): qty 2

## 2014-11-22 MED ORDER — LIDOCAINE HCL (CARDIAC) 20 MG/ML IV SOLN
INTRAVENOUS | Status: DC | PRN
  Administered 2014-11-22: 100 mg via INTRAVENOUS

## 2014-11-22 MED ORDER — ONDANSETRON HCL 4 MG/2ML IJ SOLN
4.0000 mg | Freq: Four times a day (QID) | INTRAMUSCULAR | Status: DC | PRN
  Administered 2014-11-24: 4 mg via INTRAVENOUS
  Filled 2014-11-22: qty 2

## 2014-11-22 MED ORDER — INSULIN ASPART PROT & ASPART (70-30 MIX) 100 UNIT/ML ~~LOC~~ SUSP: 0.0000 [IU] | SUBCUTANEOUS | Status: DC

## 2014-11-22 MED ORDER — DEXTROSE 5 % IV SOLN
1.5000 g | INTRAVENOUS | Status: AC
  Administered 2014-11-22: 1.5 g via INTRAVENOUS
  Filled 2014-11-22: qty 1.5

## 2014-11-22 MED ORDER — SODIUM CHLORIDE 0.9 % IV SOLN: INTRAVENOUS | Status: DC

## 2014-11-22 MED ORDER — METOPROLOL TARTRATE 1 MG/ML IV SOLN: 2.0000 mg | INTRAVENOUS | Status: DC | PRN

## 2014-11-22 MED ORDER — MUPIROCIN 2 % EX OINT
1.0000 "application " | TOPICAL_OINTMENT | CUTANEOUS | Status: AC
  Administered 2014-11-22: 1 via TOPICAL
  Filled 2014-11-22: qty 22

## 2014-11-22 MED ORDER — HEPARIN SODIUM (PORCINE) 1000 UNIT/ML IJ SOLN
INTRAMUSCULAR | Status: AC
  Filled 2014-11-22: qty 1

## 2014-11-22 MED ORDER — ALPRAZOLAM 0.5 MG PO TABS
0.5000 mg | ORAL_TABLET | Freq: Three times a day (TID) | ORAL | Status: DC
  Administered 2014-11-22 – 2014-11-24 (×5): 0.5 mg via ORAL
  Filled 2014-11-22 (×5): qty 1

## 2014-11-22 MED ORDER — LACTATED RINGERS IV SOLN
INTRAVENOUS | Status: DC
  Administered 2014-11-22: 10:00:00 via INTRAVENOUS

## 2014-11-22 MED ORDER — AMPHETAMINE-DEXTROAMPHET ER 10 MG PO CP24
30.0000 mg | ORAL_CAPSULE | Freq: Every day | ORAL | Status: DC
  Administered 2014-11-23: 30 mg via ORAL
  Filled 2014-11-22: qty 3

## 2014-11-22 MED ORDER — ADULT MULTIVITAMIN W/MINERALS CH
1.0000 | ORAL_TABLET | Freq: Every day | ORAL | Status: DC
  Administered 2014-11-22 – 2014-11-24 (×3): 1 via ORAL
  Filled 2014-11-22 (×3): qty 1

## 2014-11-22 MED ORDER — PROPOFOL 10 MG/ML IV BOLUS
INTRAVENOUS | Status: AC
  Filled 2014-11-22: qty 20

## 2014-11-22 MED ORDER — LACTATED RINGERS IV SOLN
INTRAVENOUS | Status: DC | PRN
  Administered 2014-11-22 (×2): via INTRAVENOUS

## 2014-11-22 MED ORDER — SODIUM CHLORIDE 0.9 % IR SOLN
Status: DC | PRN
  Administered 2014-11-22: 500 mL

## 2014-11-22 MED ORDER — PHENYLEPHRINE HCL 10 MG/ML IJ SOLN
INTRAMUSCULAR | Status: DC | PRN
  Administered 2014-11-22: 80 ug via INTRAVENOUS

## 2014-11-22 MED ORDER — EPHEDRINE SULFATE 50 MG/ML IJ SOLN
INTRAMUSCULAR | Status: DC | PRN
  Administered 2014-11-22 (×2): 10 mg via INTRAVENOUS

## 2014-11-22 MED ORDER — ACETAMINOPHEN 650 MG RE SUPP: 325.0000 mg | RECTAL | Status: DC | PRN

## 2014-11-22 MED ORDER — GABAPENTIN 300 MG PO CAPS
300.0000 mg | ORAL_CAPSULE | Freq: Three times a day (TID) | ORAL | Status: DC
  Administered 2014-11-22 – 2014-11-24 (×5): 300 mg via ORAL
  Filled 2014-11-22 (×7): qty 1

## 2014-11-22 MED ORDER — BISACODYL 10 MG RE SUPP: 10.0000 mg | Freq: Every day | RECTAL | Status: DC | PRN

## 2014-11-22 MED ORDER — HYDRALAZINE HCL 20 MG/ML IJ SOLN
5.0000 mg | INTRAMUSCULAR | Status: AC | PRN
  Administered 2014-11-23 (×2): 5 mg via INTRAVENOUS
  Filled 2014-11-22 (×2): qty 1

## 2014-11-22 MED ORDER — HEPARIN SODIUM (PORCINE) 1000 UNIT/ML IJ SOLN
INTRAMUSCULAR | Status: DC | PRN
  Administered 2014-11-22: 8000 [IU] via INTRAVENOUS

## 2014-11-22 MED ORDER — LISINOPRIL 20 MG PO TABS
20.0000 mg | ORAL_TABLET | Freq: Two times a day (BID) | ORAL | Status: DC
  Administered 2014-11-22 – 2014-11-24 (×4): 20 mg via ORAL
  Filled 2014-11-22 (×5): qty 1

## 2014-11-22 MED ORDER — MAGNESIUM SULFATE 2 GM/50ML IV SOLN: 2.0000 g | Freq: Every day | INTRAVENOUS | Status: DC | PRN

## 2014-11-22 MED ORDER — METFORMIN HCL 850 MG PO TABS
850.0000 mg | ORAL_TABLET | Freq: Three times a day (TID) | ORAL | Status: DC
  Administered 2014-11-23 – 2014-11-24 (×4): 850 mg via ORAL
  Filled 2014-11-22 (×7): qty 1

## 2014-11-22 MED ORDER — PHENOL 1.4 % MT LIQD: 1.0000 | OROMUCOSAL | Status: DC | PRN

## 2014-11-22 MED ORDER — SODIUM CHLORIDE 0.9 % IV SOLN: 500.0000 mL | Freq: Once | INTRAVENOUS | Status: AC | PRN

## 2014-11-22 SURGICAL SUPPLY — 56 items
BANDAGE ESMARK 6X9 LF (GAUZE/BANDAGES/DRESSINGS) IMPLANT
BENZOIN TINCTURE PRP APPL 2/3 (GAUZE/BANDAGES/DRESSINGS) ×6 IMPLANT
BNDG ESMARK 6X9 LF (GAUZE/BANDAGES/DRESSINGS)
CANISTER SUCTION 2500CC (MISCELLANEOUS) ×3 IMPLANT
CANNULA VESSEL 3MM 2 BLNT TIP (CANNULA) ×3 IMPLANT
CATH EMB 4FR 80CM (CATHETERS) ×6 IMPLANT
CLIP LIGATING EXTRA MED SLVR (CLIP) ×3 IMPLANT
CLIP LIGATING EXTRA SM BLUE (MISCELLANEOUS) ×3 IMPLANT
CLOSURE STERI-STRIP 1/2X4 (GAUZE/BANDAGES/DRESSINGS) ×2
CLOSURE WOUND 1/2 X4 (GAUZE/BANDAGES/DRESSINGS) ×1
CLSR STERI-STRIP ANTIMIC 1/2X4 (GAUZE/BANDAGES/DRESSINGS) ×4 IMPLANT
CUFF TOURNIQUET SINGLE 34IN LL (TOURNIQUET CUFF) IMPLANT
CUFF TOURNIQUET SINGLE 44IN (TOURNIQUET CUFF) IMPLANT
DRAIN SNY 10X20 3/4 PERF (WOUND CARE) IMPLANT
DRAPE X-RAY CASS 24X20 (DRAPES) IMPLANT
DRSG COVADERM 4X10 (GAUZE/BANDAGES/DRESSINGS) IMPLANT
DRSG COVADERM 4X6 (GAUZE/BANDAGES/DRESSINGS) ×6 IMPLANT
DRSG COVADERM 4X8 (GAUZE/BANDAGES/DRESSINGS) IMPLANT
ELECT REM PT RETURN 9FT ADLT (ELECTROSURGICAL) ×3
ELECTRODE REM PT RTRN 9FT ADLT (ELECTROSURGICAL) ×1 IMPLANT
EVACUATOR SILICONE 100CC (DRAIN) IMPLANT
GAUZE SPONGE 4X4 12PLY STRL (GAUZE/BANDAGES/DRESSINGS) ×3 IMPLANT
GLOVE BIOGEL PI IND STRL 6.5 (GLOVE) ×2 IMPLANT
GLOVE BIOGEL PI IND STRL 7.5 (GLOVE) ×1 IMPLANT
GLOVE BIOGEL PI INDICATOR 6.5 (GLOVE) ×4
GLOVE BIOGEL PI INDICATOR 7.5 (GLOVE) ×2
GLOVE ECLIPSE 7.0 STRL STRAW (GLOVE) ×3 IMPLANT
GLOVE SS BIOGEL STRL SZ 7.5 (GLOVE) ×1 IMPLANT
GLOVE SUPERSENSE BIOGEL SZ 7.5 (GLOVE) ×2
GOWN STRL REUS W/ TWL LRG LVL3 (GOWN DISPOSABLE) ×4 IMPLANT
GOWN STRL REUS W/TWL LRG LVL3 (GOWN DISPOSABLE) ×8
INSERT FOGARTY SM (MISCELLANEOUS) IMPLANT
KIT BASIN OR (CUSTOM PROCEDURE TRAY) ×3 IMPLANT
KIT ROOM TURNOVER OR (KITS) ×3 IMPLANT
MARKER SKIN DUAL TIP RULER LAB (MISCELLANEOUS) ×3 IMPLANT
NS IRRIG 1000ML POUR BTL (IV SOLUTION) ×6 IMPLANT
PACK PERIPHERAL VASCULAR (CUSTOM PROCEDURE TRAY) ×3 IMPLANT
PAD ARMBOARD 7.5X6 YLW CONV (MISCELLANEOUS) ×6 IMPLANT
PADDING CAST COTTON 6X4 STRL (CAST SUPPLIES) IMPLANT
SET COLLECT BLD 21X3/4 12 (NEEDLE) IMPLANT
STAPLER VISISTAT 35W (STAPLE) IMPLANT
STOPCOCK 4 WAY LG BORE MALE ST (IV SETS) ×3 IMPLANT
STRIP CLOSURE SKIN 1/2X4 (GAUZE/BANDAGES/DRESSINGS) ×2 IMPLANT
SUT ETHILON 3 0 PS 1 (SUTURE) IMPLANT
SUT PROLENE 5 0 C 1 24 (SUTURE) ×3 IMPLANT
SUT PROLENE 6 0 CC (SUTURE) ×9 IMPLANT
SUT SILK 2 0 SH (SUTURE) ×3 IMPLANT
SUT VIC AB 2-0 CTX 36 (SUTURE) ×6 IMPLANT
SUT VIC AB 3-0 SH 27 (SUTURE) ×4
SUT VIC AB 3-0 SH 27X BRD (SUTURE) ×2 IMPLANT
SWAB COLLECTION DEVICE MRSA (MISCELLANEOUS) ×3 IMPLANT
SYR 3ML LL SCALE MARK (SYRINGE) ×6 IMPLANT
TRAY FOLEY CATH 16FRSI W/METER (SET/KITS/TRAYS/PACK) IMPLANT
TUBING EXTENTION W/L.L. (IV SETS) IMPLANT
UNDERPAD 30X30 INCONTINENT (UNDERPADS AND DIAPERS) ×3 IMPLANT
WATER STERILE IRR 1000ML POUR (IV SOLUTION) ×3 IMPLANT

## 2014-11-22 NOTE — Interval H&P Note (Signed)
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 After consideration of risks, benefits and other options for treatment, the patient has consented to  Procedure(s): THROMBECTOMY POPLITEAL ARTERY (Right) as a surgical intervention .  The patient's history has been reviewed, patient examined, no change in status, stable for surgery.  I have reviewed the patient's chart and labs.  Questions were answered to the patient's satisfaction.     Gretta BeganEarly, Aisa Schoeppner

## 2014-11-22 NOTE — Anesthesia Preprocedure Evaluation (Signed)
Anesthesia Evaluation  Patient identified by MRN, date of birth, ID band Patient awake    Reviewed: Allergy & Precautions, NPO status , Patient's Chart, lab work & pertinent test results  History of Anesthesia Complications Negative for: history of anesthetic complications  Airway Mallampati: III  TM Distance: >3 FB Neck ROM: Full    Dental  (+) Poor Dentition   Pulmonary neg shortness of breath, neg sleep apnea, neg COPDCurrent Smoker, former smoker,  breath sounds clear to auscultation        Cardiovascular hypertension, Pt. on medications - angina+ Peripheral Vascular Disease - Past MI Rhythm:Regular     Neuro/Psych PSYCHIATRIC DISORDERS CVA    GI/Hepatic GERD-  Poorly Controlled,Elevated LFTs   Endo/Other  diabetes, Poorly Controlled, Type 2, Insulin Dependent, Oral Hypoglycemic Agents  Renal/GU      Musculoskeletal   Abdominal   Peds  Hematology   Anesthesia Other Findings   Reproductive/Obstetrics                             Anesthesia Physical Anesthesia Plan  ASA: III  Anesthesia Plan: General ETT and General   Post-op Pain Management:    Induction:   Airway Management Planned: Oral ETT  Additional Equipment:   Intra-op Plan:   Post-operative Plan: Extubation in OR  Informed Consent:   Plan Discussed with:   Anesthesia Plan Comments:         Anesthesia Quick Evaluation

## 2014-11-22 NOTE — Progress Notes (Signed)
     Patient in PACU Right LE dressing clean and dry Right LE warm, no distal doppler signals  AROM in toes  S/p PROCEDURE: #1 below knee popliteal exploration popliteal and tibial thrombectomy, #2 right groin exploration and removal of nonocclusive thrombus from the femoral anastomosis of right axillofemoral bypass  He will have heparin started at 10 pm tonight   Landrum Carbonell MAUREEN PA-C

## 2014-11-22 NOTE — Anesthesia Procedure Notes (Signed)
Procedure Name: Intubation Date/Time: 11/22/2014 2:00 PM Performed by: Orlinda BlalockMCMILLEN, Ayodele Sangalang L Pre-anesthesia Checklist: Patient identified, Emergency Drugs available, Suction available, Patient being monitored and Timeout performed Patient Re-evaluated:Patient Re-evaluated prior to inductionOxygen Delivery Method: Circle system utilized Preoxygenation: Pre-oxygenation with 100% oxygen Intubation Type: IV induction Ventilation: Mask ventilation without difficulty Laryngoscope Size: Mac and 4 Grade View: Grade II Tube type: Oral Tube size: 8.0 mm Number of attempts: 1 Airway Equipment and Method: Stylet Placement Confirmation: ETT inserted through vocal cords under direct vision,  positive ETCO2 and breath sounds checked- equal and bilateral Secured at: 22 cm Tube secured with: Tape Dental Injury: Teeth and Oropharynx as per pre-operative assessment

## 2014-11-22 NOTE — Progress Notes (Signed)
ANTICOAGULATION CONSULT NOTE - Initial Consult  Pharmacy Consult for Heparin Indication: VTE  No Known Allergies  Patient Measurements: Height: 6\' 2"  (188 cm) Weight: 209 lb (94.802 kg) IBW/kg (Calculated) : 82.2 Heparin Dosing Weight: 94.8 kg  Vital Signs: Temp: 98.6 F (37 C) (05/25 1605) Temp Source: Oral (05/25 0910) BP: 150/88 mmHg (05/25 1645) Pulse Rate: 61 (05/25 1645)  Labs:  Recent Labs  11/21/14 1355 11/22/14 0940  HGB  --  12.8*  HCT  --  38.9*  PLT  --  187  APTT  --  26  LABPROT  --  14.2  INR  --  1.08  CREATININE 0.95 0.96    Estimated Creatinine Clearance: 109.4 mL/min (by C-G formula based on Cr of 0.96).   Medical History: Past Medical History  Diagnosis Date  . GERD (gastroesophageal reflux disease)   . Nicotine addiction   . ETOH abuse   . Anxiety   . Hypertension     no pcp     saw peter nisham once  . Peripheral vascular disease   . Hyperlipidemia   . Collagen vascular disease   . Type II diabetes mellitus   . History of blood transfusion 08/2013    "maybe when he had his amputation"  . 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 Chronic lower back pain   . Depression   . Bipolar disorder   . PAD (peripheral artery disease)     Assessment: [age] h/o M with h/o aortobifemoral bypass 2013, occlusion of one limb of this and underwent thrombectomy in femorofemoral bypass, then L BKA. 2/16 presented with complete thrombosis of aorta, then femoral-femoral bypass, then L AKA. Now presents to MD with N/V, cramping in R calf and cool foot. Underwent surgery today 5/25: below knee popliteal exploration popliteal and tibial thrombectomy, #2 right groin exploration and removal of nonocclusive thrombus from the femoral anastomosis of right axillofemoral bypass. No noted anticoagulation PTA. Plan to start heparin post-op for VTE.   Goal of Therapy:  Heparin level 0.3-0.7  units/ml Monitor platelets by anticoagulation protocol: Yes   Plan:  At 2200, start IV heparin (no bolus) at 1500 units/hr (16 unit/kg/hr) Check heparin level and CBC daily   Loyola Santino S. Merilynn Finlandobertson, PharmD, BCPS Clinical Staff Pharmacist Pager [phone]  Misty Stanleyobertson, Eashan Schipani Stillinger 11/22/2014,5:00 PM

## 2014-11-22 NOTE — H&P (View-Only) (Signed)
Patient resents today for follow-up of his extensive past history. He had undergone prior aortobifemoral bypass by Dr. Darrick Pennafields in 2013. He suffered a occlusion of one limb of this and underwent thrombectomy in femorofemoral bypass. Eventually underwent left below-knee amputation. 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 He underwent ask femoral and femoral-femoral bypass by myself in February 2016. He had progressive gangrenous changes of his left leg and underwent left above-knee amputation with very nice healing of this in February as well. He been seen again in March of this year at which time he had palpable dorsalis pedis pulse in the left and was making nice recovery. He presents today for continued follow-up. He has had a change in his right foot. He reports that over the weekend 2-3 days ago he had severe nausea and vomiting and noted a change in his foot. He reports Cramping in his right calf and feels that his foot is cooler than usual. He does have motor and sensory function in his right foot.  Past Medical History  Diagnosis Date  . GERD (gastroesophageal reflux disease)   . Nicotine addiction   . ETOH abuse   . Anxiety   . Hypertension     no pcp     saw peter nisham once  . Peripheral vascular disease   . Hyperlipidemia   . Collagen vascular disease   . Type II diabetes mellitus   . History of blood transfusion 08/2013    "maybe when he had his amputation"  . 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 Chronic lower back pain   . Depression   . Bipolar disorder     History  Substance Use Topics  . Smoking status: Former Smoker -- 0.12 packs/day for 18 years    Types: Cigarettes    Quit date: 09/12/2014  . Smokeless tobacco: Never Used  . Alcohol Use: No     Comment: 08/15/2014 "stopped drinking NYE 2015; used to drink 1/2 gallon vodka/d     Family History  Problem Relation  Age of Onset  . Hypertension Mother     No Known Allergies   Current outpatient prescriptions:  .  ALPRAZolam (XANAX) 1 MG tablet, Take 0.5 tablets (0.5 mg total) by mouth 3 (three) times daily., Disp: 90 tablet, Rfl: 0 .  amphetamine-dextroamphetamine (ADDERALL) 10 MG tablet, Take 2 tablets (20 mg total) by mouth daily with breakfast., Disp: 60 tablet, Rfl: 0 .  aspirin 325 MG EC tablet, Take 325 mg by mouth daily., Disp: , Rfl:  .  atenolol (TENORMIN) 50 MG tablet, Take 1 tablet (50 mg total) by mouth 2 (two) times daily., Disp: , Rfl:  .  atorvastatin (LIPITOR) 40 MG tablet, Take 40 mg by mouth every morning., Disp: , Rfl:  .  buPROPion (WELLBUTRIN) 100 MG tablet, Take 1 tablet (100 mg total) by mouth 2 (two) times daily., Disp: , Rfl:  .  CLONIDINE HCL PO, Take by mouth. Patient and spouse do not know the dosage, Disp: , Rfl:  .  folic acid (FOLVITE) 1 MG tablet, Take 1 tablet (1 mg total) by mouth daily., Disp: 30 tablet, Rfl: 1 .  gabapentin (NEURONTIN) 300 MG capsule, Take 1 capsule (300 mg total) by mouth 3 (three) times daily., Disp: 90 capsule, Rfl: 1 .  hydrochlorothiazide (HYDRODIURIL) 50 MG tablet, Take 0.5 tablets (25 mg total) by mouth daily., Disp: ,  Rfl:  .  insulin glargine (LANTUS) 100 UNIT/ML injection, Inject 0.4 mLs (40 Units total) into the skin at bedtime., Disp: 10 mL, Rfl: 11 .  lisinopril (PRINIVIL,ZESTRIL) 20 MG tablet, Take 20 mg by mouth 2 (two) times daily., Disp: , Rfl:  .  Multiple Vitamin (MULTIVITAMIN WITH MINERALS) TABS tablet, Take 1 tablet by mouth daily., Disp: , Rfl:  .  omeprazole (PRILOSEC) 20 MG capsule, Take 20 mg by mouth daily., Disp: , Rfl:  .  oxyCODONE 10 MG TABS, Take 1 tablet (10 mg total) by mouth every 4 (four) hours as needed for breakthrough pain., Disp: 120 tablet, Rfl: 0 .  QUEtiapine (SEROQUEL) 50 MG tablet, Take 1 tablet (50 mg total) by mouth 3 (three) times daily., Disp: 90 tablet, Rfl: 1 .  traMADol (ULTRAM) 50 MG tablet, Take 1  tablet (50 mg total) by mouth every 6 (six) hours as needed for moderate pain., Disp: 90 tablet, Rfl: 0 .  feeding supplement, ENSURE, (ENSURE) PUDG, Take 1 Container by mouth 3 (three) times daily with meals. (Patient not taking: Reported on 11/21/2014), Disp: , Rfl: 0 .  fentaNYL (DURAGESIC - DOSED MCG/HR) 12 MCG/HR, Place 1 patch (12.5 mcg total) onto the skin every 3 (three) days. (Patient not taking: Reported on 11/21/2014), Disp: 7 patch, Rfl: 0 .  iron polysaccharides (NIFEREX) 150 MG capsule, Take 1 capsule (150 mg total) by mouth 2 times daily at 12 noon and 4 pm. (Patient not taking: Reported on 11/21/2014), Disp: 60 capsule, Rfl: 0 .  nicotine (NICODERM CQ - DOSED IN MG/24 HOURS) 21 mg/24hr patch, Use 21 mg for two weeks. Then 14 mg patch daily x4 weeks then 7 mg patch daily x 4 weeks. Then stop (Patient not taking: Reported on 11/21/2014), Disp: 14 patch, Rfl: 0 .  thiamine 100 MG tablet, Take 1 tablet (100 mg total) by mouth daily. (Patient not taking: Reported on 11/21/2014), Disp: 30 tablet, Rfl: 0  Filed Vitals:   11/21/14 1032 11/21/14 1034  BP: 176/96 159/99  Pulse: 69 67  Resp: 18   Height:  (1.88 m)   Weight: 219 lb (99.338 kg)     Body mass index is 28.11 kg/(m^2).       On physical exam is well-developed gentleman in no acute distress sitting in a wheelchair. He does have a well-healed axillary incision and has easily palpable accident in femorofemoral bypass pulse. I do not palpate a popliteal or pedal pulse on the right. He has a completely healed above-knee amputation on the left.  He underwent duplex today and this shows an ankle arm index of 0.53 on the right. June shows occlusion of his popliteal artery and possible tibial vessels as well. His axillofemoral and femorofemoral bypass are patent by duplex.  Impression and plan: New occlusion of right popliteal artery. This was widely patent on his CT angiogram with runoff in February 2016. Although he does not have  a profound ischemia, I would recommend treatment to not "lose ground" regarding his arterial flow. Again discussed with the patient and his family on usual this was to have continued recurrent thrombotic events. I did discuss the potential long-term anticoagulation following resolution of his acute illness. We have scheduled him for a CT angiogram to evaluate his right leg more thoroughly and plan for revascularization tomorrow at [facility].

## 2014-11-22 NOTE — Transfer of Care (Signed)
Immediate Anesthesia Transfer of Care Note  Patient: [name] [name]  Procedure(s) Performed: Procedure(s): RIGHT POPLITEAL ARTERY THROMBECTOMY,RIGHT GROIN EXPLORATION ,REMOVAL OF  NON OCCLUSIVE THROMBUS OF AX - FEM (Right)  Patient Location: PACU  Anesthesia Type:General  Level of Consciousness: awake, alert , oriented and patient cooperative  Airway & Oxygen Therapy: Patient Spontanous Breathing and Patient connected to nasal cannula oxygen  Post-op Assessment: Report given to RN, Post -op Vital signs reviewed and stable and Patient moving all extremities  Post vital signs: Reviewed and stable  Last Vitals:  Filed Vitals:   11/22/14 0910  BP: 146/86  Pulse: 64  Temp: 36.1 C  Resp: 18    Complications: No apparent anesthesia complications

## 2014-11-22 NOTE — Anesthesia Postprocedure Evaluation (Signed)
Anesthesia Post Note  Patient: [name] LauthRobert [name]  Procedure(s) Performed: Procedure(s) (LRB): RIGHT POPLITEAL ARTERY THROMBECTOMY,RIGHT GROIN EXPLORATION ,REMOVAL OF  NON OCCLUSIVE THROMBUS OF AX - FEM (Right)  Anesthesia type: General  Patient location: PACU  Post pain: Pain level controlled and Adequate analgesia  Post assessment: Post-op Vital signs reviewed, Patient's Cardiovascular Status Stable, Respiratory Function Stable, Patent Airway and Pain level controlled  Last Vitals:  Filed Vitals:   11/22/14 1700  BP: 157/80  Pulse: 64  Temp:   Resp: 15    Post vital signs: Reviewed and stable  Level of consciousness: awake, alert  and oriented  Complications: No apparent anesthesia complications

## 2014-11-22 NOTE — Op Note (Signed)
OPERATIVE REPORT  DATE OF SURGERY: 11/22/2014  PATIENT: [name] [name], 49 y.o. male MRN: [id number]  DOB: 1965/11/21  PRE-OPERATIVE DIAGNOSIS: Right foot ischemia with popliteal thrombus and nonocclusive thrombus in the axillofemoral graft  POST-OPERATIVE DIAGNOSIS:  Same  PROCEDURE: #1 below knee popliteal exploration popliteal and tibial thrombectomy, #2 right groin exploration and removal of nonocclusive thrombus from the femoral anastomosis of right axillofemoral bypass  SURGEON:  [name] Began, M.D.  PHYSICIAN ASSISTANT: Collins PA-C  ANESTHESIA:  Gen.  EBL: 250 ml  Total I/O In: 1600 [I.V.:1600] Out: 250 [Blood:250]  BLOOD ADMINISTERED: None  DRAINS: None  SPECIMEN: Perigraft fluid for culture  COUNTS CORRECT:  YES  PLAN OF CARE: PACU   PATIENT DISPOSITION:  PACU - hemodynamically stable  PROCEDURE DETAILS: The patient was taken to the operating room placed supine position where the area of the right groin right leg were prepped and draped in usual sterile fashion. Incision was made in the prior groin scar and carried down to isolate the femoral anastomosis of the prior placed asked him bypass. There was clear straw-colored fluid around the graft this was sent for stat Gram stain culture and sensitivity. Gram stain showed the polys with no organisms. The anastomosis to the femorofemoral bypass was also exposed. The anastomosis to the femoral artery was well incorporated. Next a separate incision was made over the medial aspect of the below-knee popliteal artery. The artery was exposed and was of moderate size. There was no suggestion of any atherosclerotic change. There was no pulse. The anterior tibial was encircled with red Vesseloops and the tibioperoneal trunk was also encircled with red Vesseloops Potts tie. The patient was given 8000 units intravenous heparin and after adequate circulation time the popliteal artery was opened transversely. Thrombus was removed from  the below-knee popliteal artery and this actually removed a clot N/A have organized thrombus that actually extended down to the tibioperoneal trunk anterior tibial and posterior tibial. There was good backbleeding. The 4 Fogarty catheter was passed down to the ankle and to the anterior tibial and there was some thrombus removed it was at the level of the dorsalis pedis artery. Next the Fogarty was passed down the tibioperoneal trunk down to the ankle and no thrombus was removed. Fogarty was cast passed proximally and there was no thrombus proximal. The incision the popliteal artery was closed with interrupted 60 proline sutures. Clamps were removed first from floor to the tibial vessels. Next attention was returned to the right groin. The axillofemoral graft was occluded below the level of the right to left femorofemoral graft. The hood of the graft was opened with 11 blade some ulcerative Potts scissors. A 4 Fogarty catheter with a stopcock was passed up the external iliac for control digital control was used for the superficial femoral artery. There was a thrombus present in the hood of the graft as was seen on CT scan and this was removed directly. Fogarty catheter was passed up the access down bypass through the lower from the axillary anastomosis and a slight amount of soft removed from the axillofemoral bypass as well. There was good inflow. The incision and the hood of the graft was closed with a running 50 proline suture. Clamps were removed and good hemostasis was encountered. The patient did not receive protamine for reversal of the heparin. The wounds were irrigated with saline. The biphasic signal in the anterior tibial and tibioperoneal trunk at the level of the knee incision. Patient was wounds were closed  with 205 control and the fascia and the skin was closed with 3-0 subcuticular Vicryls sutures. Sterile dressing was applied   [name] [name], M.D. 11/22/2014 3:56 PM

## 2014-11-23 ENCOUNTER — Encounter (HOSPITAL_COMMUNITY): Payer: Self-pay

## 2014-11-23 ENCOUNTER — Encounter (HOSPITAL_COMMUNITY): Payer: Self-pay | Admitting: Vascular Surgery

## 2014-11-23 LAB — BASIC METABOLIC PANEL
ANION GAP: 10 (ref 5–15)
BUN: 11 mg/dL (ref 6–20)
CALCIUM: 8.5 mg/dL — AB (ref 8.9–10.3)
CO2: 27 mmol/L (ref 22–32)
Chloride: 101 mmol/L (ref 101–111)
Creatinine, Ser: 0.99 mg/dL (ref 0.61–1.24)
Glucose, Bld: 198 mg/dL — ABNORMAL HIGH (ref 65–99)
POTASSIUM: 3.4 mmol/L — AB (ref 3.5–5.1)
Sodium: 138 mmol/L (ref 135–145)

## 2014-11-23 LAB — CBC
HCT: 31.9 % — ABNORMAL LOW (ref 39.0–52.0)
HEMATOCRIT: 31 % — AB (ref 39.0–52.0)
HEMOGLOBIN: 10.3 g/dL — AB (ref 13.0–17.0)
Hemoglobin: 10.3 g/dL — ABNORMAL LOW (ref 13.0–17.0)
MCH: 27.7 pg (ref 26.0–34.0)
MCH: 27.2 pg (ref 26.0–34.0)
MCHC: 32.3 g/dL (ref 30.0–36.0)
MCHC: 33.2 g/dL (ref 30.0–36.0)
MCV: 83.3 fL (ref 78.0–100.0)
MCV: 84.2 fL (ref 78.0–100.0)
PLATELETS: 180 10*3/uL (ref 150–400)
Platelets: 172 10*3/uL (ref 150–400)
RBC: 3.72 MIL/uL — ABNORMAL LOW (ref 4.22–5.81)
RBC: 3.79 MIL/uL — ABNORMAL LOW (ref 4.22–5.81)
RDW: 14.9 % (ref 11.5–15.5)
RDW: 14.9 % (ref 11.5–15.5)
WBC: 6.5 10*3/uL (ref 4.0–10.5)
WBC: 7.3 10*3/uL (ref 4.0–10.5)

## 2014-11-23 LAB — HEPARIN LEVEL (UNFRACTIONATED)
HEPARIN UNFRACTIONATED: 0.39 [IU]/mL (ref 0.30–0.70)
Heparin Unfractionated: 0.2 IU/mL — ABNORMAL LOW (ref 0.30–0.70)

## 2014-11-23 LAB — GLUCOSE, CAPILLARY
GLUCOSE-CAPILLARY: 133 mg/dL — AB (ref 65–99)
Glucose-Capillary: 130 mg/dL — ABNORMAL HIGH (ref 65–99)
Glucose-Capillary: 210 mg/dL — ABNORMAL HIGH (ref 65–99)
Glucose-Capillary: 124 mg/dL — ABNORMAL HIGH (ref 65–99)

## 2014-11-23 MED ORDER — RIVAROXABAN 15 MG PO TABS
15.0000 mg | ORAL_TABLET | Freq: Two times a day (BID) | ORAL | Status: DC
  Administered 2014-11-23 – 2014-11-24 (×2): 15 mg via ORAL
  Filled 2014-11-23 (×4): qty 1

## 2014-11-23 MED ORDER — INSULIN ASPART 100 UNIT/ML ~~LOC~~ SOLN
0.0000 [IU] | Freq: Three times a day (TID) | SUBCUTANEOUS | Status: DC
  Administered 2014-11-23 – 2014-11-24 (×2): 1 [IU] via SUBCUTANEOUS

## 2014-11-23 MED ORDER — INSULIN ASPART 100 UNIT/ML ~~LOC~~ SOLN: 0.0000 [IU] | Freq: Three times a day (TID) | SUBCUTANEOUS | Status: DC

## 2014-11-23 NOTE — Progress Notes (Signed)
ANTICOAGULATION CONSULT NOTE - Follow Up Consult  Pharmacy Consult for heparin Indication: VTE   Labs:  Recent Labs  11/21/14 1355  11/22/14 0940 11/23/14 0040 11/23/14 0251  HGB  --   < > 12.8* 10.3* 10.3*  HCT  --   --  38.9* 31.0* 31.9*  PLT  --   --  187 172 180  APTT  --   --  26  --   --   LABPROT  --   --  14.2  --   --   INR  --   --  1.08  --   --   HEPARINUNFRC  --   --   --   --  0.20*  CREATININE 0.95  --  0.96 0.99  --   < > = values in this interval not displayed.    Assessment: 49yo male subtherapeutic on heparin with initial dosing for VTE.  Goal of Therapy:  Heparin level 0.3-0.7 units/ml   Plan: Will increase heparin gtt by 2 units/kg/hr to 1700 units/hr and check level in 6hr.  Vernard GamblesVeronda Matthews Franks, PharmD, BCPS  11/23/2014,5:19 AM

## 2014-11-23 NOTE — Evaluation (Signed)
Occupational Therapy Evaluation Patient Details Name: [name] [name] MRN: [id number] DOB: [birth year] Today's Date: 11/23/2014    History of Present Illness Pt is a [name] PMH ETOH abuse, anxiety,  HTN, PVD, HLD, DM 2, CVA 2013, bipolar disorder, depression with hx of L AKA after attempts at bypass surgeries failed. Pt now s/p Right popliteal artery thrombectomy, right groin exploration, and removal of non-occlusive thrombus.    Clinical Impression   PTA pt lived at home and was independent with PRN assistance from girlfriend with use of RW or at w/c level. Pt is limited by RLE pain and impaired balance and requires assist for LB ADLs and functional transfers. Pt will benefit from acute OT to progress to Supervision level to d/c home safely.      Follow Up Recommendations  No OT follow up    Equipment Recommendations  None recommended by OT    Recommendations for Other Services       Precautions / Restrictions Precautions Precautions: Fall      Mobility Bed Mobility Overal bed mobility: Modified Independent                Transfers Overall transfer level: Needs assistance Equipment used: Rolling walker (2 wheeled) Transfers: Sit to/from Stand Sit to Stand: Min assist         General transfer comment: Min A to stabilize RW and stand from EOB elevated. Pt then able to sit<> stand from low recliner with min guard.          ADL Overall ADL's : Needs assistance/impaired Eating/Feeding: Independent;Sitting   Grooming: Set up;Sitting   Upper Body Bathing: Set up;Sitting   Lower Body Bathing: Minimal assistance;Sit to/from stand   Upper Body Dressing : Set up;Sitting   Lower Body Dressing: Minimal assistance;Sit to/from stand   Toilet Transfer: Minimal assistance;Ambulation;RW Toilet Transfer Details (indicate cue type and reason): Pt able to stand from EOB and ambulate around to recliner.          Functional mobility during ADLs: Minimal  assistance;Rolling walker General ADL Comments: Pt needed only light assist for functional mobility to stabilize RW and for initial balance, however doing very well with mobility.      Vision Additional Comments: No change from baseline   Perception     Praxis      Pertinent Vitals/Pain Pain Assessment: No/denies pain     Hand Dominance Left   Extremity/Trunk Assessment Upper Extremity Assessment Upper Extremity Assessment: Overall WFL for tasks assessed   Lower Extremity Assessment Lower Extremity Assessment: Defer to PT evaluation   Cervical / Trunk Assessment Cervical / Trunk Assessment: Normal   Communication Communication Communication: No difficulties   Cognition Arousal/Alertness: Awake/alert Behavior During Therapy: WFL for tasks assessed/performed Overall Cognitive Status: History of cognitive impairments - at baseline                                Home Living Family/patient expects to be discharged to:: Private residence Living Arrangements: Spouse/significant other;Children Available Help at Discharge: Friend(s);Available 24 hours/day;Personal care attendant;Other (Comment) Type of Home: Apartment Home Access: Stairs to enter;Other (comment) Entrance Stairs-Number of Steps: 1 Entrance Stairs-Rails: None Home Layout: One level     Bathroom Shower/Tub: Tub/shower unit Shower/tub characteristics: Curtain Firefighter: Standard     Home Equipment: Environmental consultant - 2 wheels;Bedside commode;Tub bench;Wheelchair - manual          Prior Functioning/Environment Level of Independence: Independent  with assistive device(s)        Comments: Pt able to use w/c and RW to get around home. Reports he dresses himself and showers using tub bench. Girlfriend present and does assist as needed.     OT Diagnosis: Generalized weakness;Acute pain   OT Problem List: Decreased strength;Decreased range of motion;Decreased activity tolerance;Impaired balance  (sitting and/or standing);Pain   OT Treatment/Interventions: Self-care/ADL training;Therapeutic exercise;Energy conservation;DME and/or AE instruction;Therapeutic activities;Patient/family education;Balance training    OT Goals(Current goals can be found in the care plan section) Acute Rehab OT Goals Patient Stated Goal: go home soon OT Goal Formulation: With patient Time For Goal Achievement: 12/07/14 Potential to Achieve Goals: Good ADL Goals Pt Will Transfer to Toilet: with supervision;ambulating Pt Will Perform Toileting - Clothing Manipulation and hygiene: with supervision;sit to/from stand Pt Will Perform Tub/Shower Transfer: with supervision;tub bench;rolling walker  OT Frequency: Min 2X/week    End of Session Equipment Utilized During Treatment: Gait belt;Rolling walker  Activity Tolerance: Patient tolerated treatment well Patient left: in chair;with call bell/phone within reach;with family/visitor present   Time: 1410-1440 OT Time Calculation (min): 30 min Charges:  OT General Charges $OT Visit: 1 Procedure OT Evaluation $Initial OT Evaluation Tier I: 1 Procedure OT Treatments $Self Care/Home Management : 8-22 mins G-Codes:    Nena JordanMiller, Pieper Kasik M 11/23/2014, 3:05 PM   Carney LivingLeeAnn Marie Draydon Clairmont, OTR/L Occupational Therapist [phone] (pager)

## 2014-11-23 NOTE — Progress Notes (Addendum)
CM asked to do benefits check on XARELTO 15mg  twice a day x 21 days, then 20mg  everyday. Cost for pt would be $ 3.00,  and $ 3.00 per refill for medication per CMA  to CM. Gae Gallopngela Stevens Magwood RN,BSN,CM [phone]

## 2014-11-23 NOTE — Evaluation (Signed)
Physical Therapy Evaluation and Discharge Patient Details Name: [name] [name] MRN: [id number] DOB: Jun 06, 1966 Today's Date: 11/23/2014   History of Present Illness  Pt is a [name] PMH ETOH abuse, anxiety,  HTN, PVD, HLD, DM 2, CVA 2013, bipolar disorder, depression with hx of L AKA after attempts at bypass surgeries failed. Pt now s/p Right popliteal artery thrombectomy, right groin exploration, and removal of non-occlusive thrombus.     Clinical Impression  Patient evaluated by Physical Therapy with no further acute PT needs identified. All education has been completed and the patient has no further questions. Pt/girlfriend very familiar with use of equipment from Lt AKA. PT is signing off. Thank you for this referral.     Follow Up Recommendations No PT follow up;Supervision for mobility/OOB    Equipment Recommendations  None recommended by PT    Recommendations for Other Services       Precautions / Restrictions Precautions Precautions: Fall      Mobility  Bed Mobility Overal bed mobility: Modified Independent                Transfers Overall transfer level: Needs assistance Equipment used: Rolling walker (2 wheeled) Transfers: Sit to/from Stand Sit to Stand: Supervision         General transfer comment: proper use of RW/sequencing from recliner and w/c; repeat x 3  Ambulation/Gait Ambulation/Gait assistance: Min guard Ambulation Distance (Feet): 45 Feet (seated rest; 45 ft) Assistive device: Rolling walker (2 wheeled) Gait Pattern/deviations: Step-to pattern     General Gait Details: vc to step into center of RW (not allowing his Rt toes to move forward of front wheels of RW)  Stairs            Wheelchair Mobility    Modified Rankin (Stroke Patients Only)       Balance Overall balance assessment: Needs assistance         Standing balance support: Bilateral upper extremity supported Standing balance-Leahy Scale: Poor Standing  balance comment: must rely on bil UEs due to L AKA; very stable with Rw                             Pertinent Vitals/Pain Pain Assessment: No/denies pain    Home Living Family/patient expects to be discharged to:: Private residence Living Arrangements: Spouse/significant other;Children Available Help at Discharge: Friend(s);Available 24 hours/day;Personal care attendant;Other (Comment) Type of Home: Apartment Home Access: Stairs to enter;Other (comment) Entrance Stairs-Rails: None Entrance Stairs-Number of Steps: 1 Home Layout: One level Home Equipment: Walker - 2 wheels;Bedside commode;Tub bench;Wheelchair - manual      Prior Function Level of Independence: Independent with assistive device(s)         Comments: Pt able to use w/c and RW to get around home. Reports he dresses himself and showers using tub bench. Girlfriend present and does assist as needed.      Hand Dominance   Dominant Hand: Left    Extremity/Trunk Assessment   Upper Extremity Assessment: Defer to OT evaluation           Lower Extremity Assessment: RLE deficits/detail;LLE deficits/detail RLE Deficits / Details: AROM hip and knee flexion >90; ankle DF to 90 (limited by pain/edema) LLE Deficits / Details: Lt AKA  Cervical / Trunk Assessment: Normal  Communication   Communication: No difficulties  Cognition Arousal/Alertness: Awake/alert Behavior During Therapy: WFL for tasks assessed/performed Overall Cognitive Status: History of cognitive impairments - at baseline  General Comments      Exercises General Exercises - Lower Extremity Ankle Circles/Pumps: AROM;Right;10 reps Long Arc Quad: AROM;Right;5 reps Hip Flexion/Marching: AROM;Right;5 reps      Assessment/Plan    PT Assessment Patent does not need any further PT services  PT Diagnosis Difficulty walking   PT Problem List    PT Treatment Interventions     PT Goals (Current goals can be  found in the Care Plan section) Acute Rehab PT Goals Patient Stated Goal: go home soon PT Goal Formulation: All assessment and education complete, DC therapy    Frequency     Barriers to discharge        Co-evaluation               End of Session Equipment Utilized During Treatment: Gait belt Activity Tolerance: Patient tolerated treatment well Patient left: in chair;with call bell/phone within reach;with family/visitor present           Time: [id number] PT Time Calculation (min) (ACUTE ONLY): 23 min   Charges:   PT Evaluation $Initial PT Evaluation Tier I: 1 Procedure PT Treatments $Gait Training: 8-22 mins   PT G Codes:        Jemell Town 11/23/2014, 3:21 PM Pager [phone]

## 2014-11-23 NOTE — Progress Notes (Signed)
ANTICOAGULATION CONSULT NOTE - Follow Up Consult  Pharmacy Consult for heparin Indication: VTE   Labs:  Recent Labs  11/21/14 1355  11/22/14 0940 11/23/14 0040 11/23/14 0251 11/23/14 1057  HGB  --   < > 12.8* 10.3* 10.3*  --   HCT  --   --  38.9* 31.0* 31.9*  --   PLT  --   --  187 172 180  --   APTT  --   --  26  --   --   --   LABPROT  --   --  14.2  --   --   --   INR  --   --  1.08  --   --   --   HEPARINUNFRC  --   --   --   --  0.20* 0.39  CREATININE 0.95  --  0.96 0.99  --   --   < > = values in this interval not displayed.    Assessment: 49yo male therapeutic on heparin with initial dosing for VTE.  Goal of Therapy:  Heparin level 0.3-0.7 units/ml   Plan: Continue heparin at 1700 units / hr Follow up AM labs Follow up for change to po  Thank you. Okey RegalLisa Mane Consolo, PharmD [phone] 11/23/2014,1:18 PM

## 2014-11-23 NOTE — Progress Notes (Signed)
Subjective: Interval History: none.. Alert and oriented. Family with him. Reports that his right foot is warm and non-painful  Objective: Vital signs in last 24 hours: Temp:  [97 F (36.1 C)-98.6 F (37 C)] 98.4 F (36.9 C) (05/26 0400) Pulse Rate:  [58-94] 69 (05/25 2341) Resp:  [12-23] 18 (05/25 2341) BP: (133-166)/(56-97) 155/97 mmHg (05/25 2341) SpO2:  [95 %-100 %] 99 % (05/25 2341) Weight:  [209 lb (94.802 kg)-215 lb 6.2 oz (97.7 kg)] 215 lb 6.2 oz (97.7 kg) (05/25 1854)  Intake/Output from previous day: 05/25 0701 - 05/26 0700 In: 2280 [P.O.:680; I.V.:1600] Out: 1125 [Urine:875; Blood:250] Intake/Output this shift: Total I/O In: 480 [P.O.:480] Out: 875 [Urine:875]  Dopplerable ask them graft pulse. Dressings intact in his groin and popliteal space. I do not feel palpable pedal pulses. He has loud biphasic posterior tibial, anterior tibial and peroneal signals in his foot.  Lab Results:  Recent Labs  11/23/14 0040 11/23/14 0251  WBC 7.3 6.5  HGB 10.3* 10.3*  HCT 31.0* 31.9*  PLT 172 180   BMET  Recent Labs  11/22/14 0940 11/23/14 0040  NA 139 138  K 3.6 3.4*  CL 102 101  CO2 26 27  GLUCOSE 129* 198*  BUN 13 11  CREATININE 0.96 0.99  CALCIUM 9.4 8.5*    Studies/Results: Ct Angio Ao+bifem W/cm &/or Wo/cm  11/21/2014   CLINICAL DATA:  49 year old with occluded right popliteal artery. Preoperative evaluation.  EXAM: CT ANGIOGRAPHY OF ABDOMINAL AORTA WITH ILIOFEMORAL RUNOFF  TECHNIQUE: Multidetector CT imaging of the abdomen, pelvis and lower extremities was performed using the standard protocol during bolus administration of intravenous contrast. Multiplanar CT image reconstructions and MIPs were obtained to evaluate the vascular anatomy.  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 Patient now has a right  axillary-femoral bypass graft. There is some nonocclusive thrombus in the axillary-femoral bypass graft. There is approximately 30% narrowing of the lumen on sequence 5, image 61. Patient now has a patent fem-fem bypass graft. There is reconstitution of the right internal and external iliac arteries. Proximal abdominal aorta is patent. Celiac trunk is patent. Incidentally, the patient has a replaced right hepatic artery originating from the SMA. Superior mesenteric artery has mild atherosclerotic disease but patent. Bilateral renal arteries are patent.  Right Lower Extremity: The right groin anastomosis site is patent with a small amount of nonocclusive thrombus. At least one of the proximal right profunda femoral artery branches has thrombus. The right superficial femoral artery is widely patent. The proximal right popliteal artery is patent. The right popliteal artery occludes at the level of the knee joint. There is reconstitution of the anterior tibial artery, peroneal artery and posterior tibial artery. There is distal occlusion of the peroneal artery. Distal occlusion of the anterior tibial artery and occlusion of the dorsalis pedis artery. Posterior tibial artery is patent across the ankle. Posterior tibial artery may occlude in the plantar region. The popliteal artery occlusion and occlusion of the distal anterior tibial artery and peroneal artery are new from 08/16/2014.  Left Lower Extremity: Patient has an above the knee amputation. The left groin anastomosis is patent with areas of irregularity. The left superficial femoral artery is occluded. There is flow in the left profunda femoral artery branches. Mild subcutaneous edema near the amputation site.  Nonvascular structures: Visualized lung bases demonstrate atelectasis. No acute abnormality  in the visualized liver. Normal appearance of the gallbladder. Visualized spleen is unremarkable. Normal appearance of the pancreas. There are stable nodules  involving the left adrenal gland measuring roughly 1.5 and 2.6 cm. These adrenal nodules have minimally changed since 2013 and likely represent a benign etiology based on the stability. No gross abnormality to the right adrenal gland. Probable low-density cysts involving the right kidney. Normal appearance the left kidney. Negative for hydronephrosis. No gross abnormality to the prostate and urinary bladder. There is a stable left iliac lymph node measuring roughly 1.2 cm in short axis on sequence 5, image 95. Few prominent lymph nodes along the right iliac chain. These lymph nodes could be reactive based on the postsurgical changes. Patient has a large ventral hernia containing loops of bowel. No evidence for a bowel obstruction. No acute bone abnormality.  Review of the MIP images confirms the above findings.  IMPRESSION: Occlusion of the distal right popliteal artery. In addition, there are areas of occlusive thrombus involving the right profunda femoral artery branches, right anterior tibial artery, right peroneal artery and the dorsalis pedis artery. There is a small amount of nonocclusive thrombus in the right groin arterial anastomosis and nonocclusive thrombus involving the right axillary-femoral bypass. These areas of nonocclusive graft thrombus could be a source for embolic disease.  Right axillary-femoral bypass is patent with a small amount of nonocclusive thrombus. The fem-fem bypass is patent. 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  No acute abnormality in the abdomen or pelvis. Stable left adrenal nodules. Mild lymphadenopathy the pelvis is unchanged since 08/16/2014.  Large ventral hernia containing bowel. No evidence for bowel obstruction.   Electronically Signed   By: Richarda Overlie M.D.   On: 11/21/2014 17:56   Anti-infectives: Anti-infectives    Start     Dose/Rate Route Frequency Ordered Stop   11/23/14 0100  cefUROXime (ZINACEF) 1.5 g in dextrose 5 % 50  mL IVPB     1.5 g 100 mL/hr over 30 Minutes Intravenous Every 12 hours 11/22/14 1907 11/24/14 0059   11/22/14 1000  cefUROXime (ZINACEF) 1.5 g in dextrose 5 % 50 mL IVPB     1.5 g 100 mL/hr over 30 Minutes Intravenous To Surgery 11/22/14 0754 11/22/14 1410      Assessment/Plan: s/p Procedure(s): RIGHT POPLITEAL ARTERY THROMBECTOMY,RIGHT GROIN EXPLORATION ,REMOVAL OF  NON OCCLUSIVE THROMBUS OF AX - FEM (Right) Stable postop day 1. Will need anticoagulation. Discussed options of Coumadin versus new her anticoagulation oral agents. We will coordinate this with insurance carrier. Probable discharge tomorrow. Does not need to be fully anticoagulated on Coumadin if this is the choice prior to discharge. Can be regulated as an outpatient.   LOS: 1 day   Gretta Began 11/23/2014, 6:15 AM

## 2014-11-24 ENCOUNTER — Encounter (HOSPITAL_COMMUNITY): Payer: Self-pay

## 2014-11-24 ENCOUNTER — Telehealth: Payer: Self-pay | Admitting: Vascular Surgery

## 2014-11-24 LAB — CBC
HCT: 29.4 % — ABNORMAL LOW (ref 39.0–52.0)
Hemoglobin: 9.6 g/dL — ABNORMAL LOW (ref 13.0–17.0)
MCH: 27.4 pg (ref 26.0–34.0)
MCHC: 32.7 g/dL (ref 30.0–36.0)
MCV: 84 fL (ref 78.0–100.0)
PLATELETS: 168 10*3/uL (ref 150–400)
RBC: 3.5 MIL/uL — AB (ref 4.22–5.81)
RDW: 14.9 % (ref 11.5–15.5)
WBC: 7.4 10*3/uL (ref 4.0–10.5)

## 2014-11-24 LAB — GLUCOSE, CAPILLARY: GLUCOSE-CAPILLARY: 127 mg/dL — AB (ref 65–99)

## 2014-11-24 MED ORDER — RIVAROXABAN 20 MG PO TABS: 20.0000 mg | ORAL_TABLET | Freq: Every day | ORAL | Status: DC

## 2014-11-24 MED ORDER — RIVAROXABAN 20 MG PO TABS: 20.0000 mg | ORAL_TABLET | Freq: Every day | ORAL | Status: AC

## 2014-11-24 MED ORDER — TRAMADOL HCL 50 MG PO TABS: 50.0000 mg | ORAL_TABLET | Freq: Four times a day (QID) | ORAL | Status: DC | PRN

## 2014-11-24 MED ORDER — OXYCODONE HCL 10 MG PO TABS: 10.0000 mg | ORAL_TABLET | ORAL | Status: DC | PRN

## 2014-11-24 MED ORDER — OXYCODONE HCL 10 MG PO TABS
10.0000 mg | ORAL_TABLET | ORAL | Status: DC | PRN
Start: 2014-11-24 — End: 2015-10-05

## 2014-11-24 MED ORDER — RIVAROXABAN (XARELTO) VTE STARTER PACK (15 & 20 MG): ORAL_TABLET | ORAL | Status: DC

## 2014-11-24 MED ORDER — RIVAROXABAN (XARELTO) VTE STARTER PACK (15 & 20 MG)
ORAL_TABLET | ORAL | Status: DC
Start: 2014-11-24 — End: 2014-11-24

## 2014-11-24 MED ORDER — GABAPENTIN 300 MG PO CAPS: 300.0000 mg | ORAL_CAPSULE | Freq: Three times a day (TID) | ORAL | Status: AC

## 2014-11-24 NOTE — Telephone Encounter (Signed)
-----   Message from Sharee PimpleMarilyn K McChesney, RN sent at 11/24/2014  9:08 AM EDT ----- Regarding: Schedule   ----- Message -----    From: Raymond GurneyKimberly A Trinh, PA-C    Sent: 11/24/2014   7:41 AM      To: [facility] Charge Pool  S/p #1 below knee popliteal exploration popliteal and tibial thrombectomy, #2 right groin exploration and removal of nonocclusive thrombus from the femoral anastomosis of right axillofemoral bypass 11/22/14  F/u in 2 weeks with Dr. Arbie CookeyEarly  Thanks Selena BattenKim

## 2014-11-24 NOTE — Progress Notes (Signed)
Reviewed discharge instructions with patient and significant other all questions answered at this time.  Prescriptions given to patient.  Pt. VSS with no s/s of distress noted.  Pt. Stable at discharge.

## 2014-11-24 NOTE — Discharge Summary (Signed)
[facility] Discharge Summary  [name] [name] 14-Feb-1966 49 y.o. 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 who had undergone prior aortobifemoral bypass by Dr. Darrick Penna in 2013. He suffered an occlusion of one limb of this and underwent thrombectomy and  femorofemoral bypass. Eventually underwent left below-knee amputation. 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 He underwent axillary- femoral and femoral-femoral bypass by Dr. Arbie Cookey in February 2016. He had progressive gangrenous changes of his left leg and underwent left above-knee amputation with very nice healing of this in February as well. He been seen again in March of this year at which time he had palpable dorsalis pedis pulse in the left and was making nice recovery. He presented for continued follow-up. He has had a change in his right foot. He reports that over the weekend 2-3 days ago he had severe nausea and vomiting and noted a change in his foot. He reports Cramping in his right calf and feels that his foot is cooler than usual. He does have motor and sensory function in his right foot.  Hospital Course:  The patient was admitted to the hospital and taken to the operating room on 11/22/2014 and underwent: #1 below knee popliteal exploration popliteal and tibial thrombectomy, #2 right groin exploration and removal of nonocclusive thrombus from the femoral anastomosis of right axillofemoral bypass    The patient tolerated the procedure well and was transported to the PACU in stable condition. He was placed on a heparin drip.   The patient was stable post-operatively. He had loud biphasic posterior tibial, anterior tibial and peroneal doppler sounds to his right foot. He had a palpable axillary femoral graft pulse. His incisions  were clean and intact. He was started on xarelto and this was approved by insurance. His wound cultures showed no growth to date. He was afebrile without leukocytosis. He was discharged on POD 2 in good condition.     CBC    Component Value Date/Time   WBC 7.4 11/24/2014 0331   RBC 3.50* 11/24/2014 0331   HGB 9.6* 11/24/2014 0331   HCT 29.4* 11/24/2014 0331   PLT 168 11/24/2014 0331   MCV 84.0 11/24/2014 0331   MCH 27.4 11/24/2014 0331   MCHC 32.7 11/24/2014 0331   RDW 14.9 11/24/2014 0331   LYMPHSABS 1.4 08/25/2014 0451   MONOABS 0.5 08/25/2014 0451   EOSABS 0.2 08/25/2014 0451   BASOSABS 0.0 08/25/2014 0451    BMET    Component Value Date/Time   NA 138 11/23/2014 0040   K 3.4* 11/23/2014 0040   CL 101 11/23/2014 0040   CO2 27 11/23/2014 0040   GLUCOSE 198* 11/23/2014 0040   BUN 11 11/23/2014 0040   CREATININE 0.99 11/23/2014 0040   CALCIUM 8.5* 11/23/2014 0040   GFRNONAA >60 11/23/2014 0040   GFRAA >60 11/23/2014 0040     Discharge Instructions:   The patient is discharged to home with extensive instructions on wound care and progressive ambulation.  They are instructed not to drive or perform any heavy lifting until returning to see the physician in his office.  Discharge Instructions    Call MD for:  redness, tenderness, or signs of infection (pain, swelling, bleeding, redness, odor or green/yellow discharge around incision site)    Complete by:  As directed  Call MD for:  severe or increased pain, loss or decreased feeling  in affected limb(s)    Complete by:  As directed      Call MD for:  temperature >100.5    Complete by:  As directed      Discharge wound care:    Complete by:  As directed   Shower daily. Wash the groin wound with soap and water daily and pat dry. (No tub bath-only shower)  Then put a dry gauze or washcloth there to keep this area dry daily and as needed.  Do not use Vaseline or neosporin on your incisions.  Only use soap and water on your  incisions and then protect and keep dry.     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 Leucocytosis 08/31/2014  . Postoperative anemia due to acute blood loss 08/31/2014  . Edema leg 08/31/2014  . Peripheral vascular disease in diabetes mellitus 08/24/2014  . Hypokalemia   . Diabetes type 2, uncontrolled   . Status post above knee amputation of left lower extremity   . S/P femoropopliteal bypass surgery   . Delirium   . Alcohol abuse   . Tobacco abuse   . Benign essential hypertension antepartum   . Anxiety state   . Fever   . Pulmonary edema   . Surgery, other elective   . Low urine output 08/19/2014  . Fever of undetermined origin 08/19/2014  . Ischemia of site of left below knee amputation 08/16/2014  . Visit for wound check-Left ABK 10/20/2013  . Wound dehiscence 10/13/2013  . Aftercare following surgery of the circulatory system, NEC 09/29/2013  . Confusion, postoperative 09/16/2013  . Unilateral complete BKA 09/09/2013  . Ischemic leg 08/30/2013  . Incisional hernia without mention of obstruction or gangrene 04/06/2013  . PAD (peripheral artery disease) 03/30/2013  . Peripheral vascular disease, unspecified 01/22/2012  . Diabetes mellitus 01/16/2012  . PVD (peripheral vascular disease) 01/13/2012  . Uncontrolled hypertension   . GERD (gastroesophageal reflux disease)   . Nicotine addiction   . Anxiety   . Occlusion and stenosis of carotid artery without mention of cerebral infarction 10/02/2011  . Atherosclerosis of native arteries of  the extremities with intermittent claudication 10/02/2011  . Claudication 09/04/2011  . Stroke 08/11/2011   Past Medical History  Diagnosis Date  . GERD (gastroesophageal reflux disease)   . Nicotine addiction   . ETOH abuse   . Anxiety   . Hypertension     no pcp     saw peter nisham once  . Peripheral vascular disease   . Hyperlipidemia   . Collagen vascular disease   . Type II diabetes mellitus   . History of blood transfusion 08/2013    "maybe when he had his amputation"  . 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  Chronic lower back pain   . Depression   . Bipolar disorder   . PAD (peripheral artery disease)        Medication List    TAKE these medications        ALPRAZolam 1 MG tablet  Commonly known as:  XANAX  Take 0.5 tablets (0.5 mg total) by mouth 3 (three) times daily.     amphetamine-dextroamphetamine 30 MG 24 hr capsule  Commonly known as:  ADDERALL XR  Take 30 mg by mouth daily.     amphetamine-dextroamphetamine 10 MG tablet  Commonly known as:  ADDERALL  Take 2 tablets (20 mg total) by mouth daily with breakfast.     aspirin 325 MG EC tablet  Take 325 mg by mouth daily.     atenolol 50 MG tablet  Commonly known as:  TENORMIN  Take 1 tablet (50 mg total) by mouth 2 (two) times daily.     atorvastatin 40 MG tablet  Commonly known as:  LIPITOR  Take 40 mg by mouth every morning.     buPROPion 100 MG tablet  Commonly known as:  WELLBUTRIN  Take 1 tablet (100 mg total) by mouth 2 (two) times daily.     CLONIDINE HCL PO  Take 1 tablet by mouth 2 (two) times daily. Patient and spouse do not know the dosage     feeding supplement (ENSURE) Pudg  Take 1 Container by mouth 3 (three) times daily with meals.     fentaNYL 12 MCG/HR  Commonly known as:  DURAGESIC - dosed mcg/hr  Place 1 patch (12.5 mcg total) onto the skin every 3 (three) days.     folic acid 1 MG tablet  Commonly  known as:  FOLVITE  Take 1 tablet (1 mg total) by mouth daily.     FUSION PLUS Caps  Take by mouth daily.     gabapentin 300 MG capsule  Commonly known as:  NEURONTIN  Take 1 capsule (300 mg total) by mouth 3 (three) times daily.     hydrochlorothiazide 50 MG tablet  Commonly known as:  HYDRODIURIL  Take 0.5 tablets (25 mg total) by mouth daily.     insulin aspart protamine- aspart (70-30) 100 UNIT/ML injection  Commonly known as:  NOVOLOG MIX 70/30  Inject 0-16 Units into the skin See admin instructions. Sliding scale     insulin glargine 100 UNIT/ML injection  Commonly known as:  LANTUS  Inject 0.4 mLs (40 Units total) into the skin at bedtime.     iron polysaccharides 150 MG capsule  Commonly known as:  NIFEREX  Take 1 capsule (150 mg total) by mouth 2 times daily at 12 noon and 4 pm.     lisinopril 20 MG tablet  Commonly known as:  PRINIVIL,ZESTRIL  Take 20 mg by mouth 2 (two) times daily.     metFORMIN 850 MG tablet  Commonly known as:  GLUCOPHAGE  Take 850 mg by mouth 3 (three) times daily.     multivitamin with minerals Tabs tablet  Take 1 tablet by mouth daily.     nicotine 21 mg/24hr patch  Commonly known as:  NICODERM CQ - dosed in mg/24 hours  Use 21 mg for two weeks. Then 14 mg patch daily x4 weeks then 7 mg patch daily x 4 weeks. Then stop     omeprazole 20 MG capsule  Commonly known as:  PRILOSEC  Take 20 mg by mouth daily.     Oxycodone HCl 10 MG  Tabs  Take 1 tablet (10 mg total) by mouth every 4 (four) hours as needed.     potassium chloride 10 MEQ tablet  Commonly known as:  K-DUR  Take 10 mEq by mouth daily.     QUEtiapine 50 MG tablet  Commonly known as:  SEROQUEL  Take 1 tablet (50 mg total) by mouth 3 (three) times daily.     QUEtiapine 100 MG tablet  Commonly known as:  SEROQUEL  Take 100 mg by mouth 3 (three) times daily.     Rivaroxaban 15 & 20 MG Tbpk  Commonly known as:  XARELTO STARTER PACK  Take as directed on package: Start  with one 15mg  tablet by mouth twice a day with food. On Day 22, switch to one 20mg  tablet once a day with food.     rivaroxaban 20 MG Tabs tablet  Commonly known as:  XARELTO  Take 1 tablet (20 mg total) by mouth daily with supper. Please start after you finish your xarelto starter pack.     thiamine 100 MG tablet  Take 1 tablet (100 mg total) by mouth daily.     traMADol 50 MG tablet  Commonly known as:  ULTRAM  Take 1 tablet (50 mg total) by mouth every 6 (six) hours as needed for moderate pain.       Rx's Roxicodone #30 No Refill, Tramadol #20 No Refill Gabapentin # 90 No Refill Xarelto 15 mg/20 mg starter pack Xarelto 20 mg  Disposition: Home  Patient's condition: is Good  Follow up: 1. Dr. Arbie Cookey in 2 weeks   Maris Berger, PA-C [facility] [phone] 11/24/2014  10:45 AM  - For VQI Registry use --- Instructions: Press F2 to tab through selections.  Delete question if not applicable.   Post-op:  Wound infection: No  Graft infection: No  Transfusion: No   New Arrhythmia: No Ipsilateral amputation: No, [ ]  Minor, [ ]  BKA, [ ]  AKA Discharge patency: [ ]  Primary, [ ]  Primary assisted, [ x] Secondary, [ ]  Occluded Patency judged by: [x ] Doppler, [x ] Palpable graft pulse, [ ]  Palpable distal pulse, [ ]  ABI inc. > 0.15, [ ]  Duplex D/C Ambulatory Status: Ambulatory with Assistance  Complications: MI: No, [ ]  Troponin only, [ ]  EKG or Clinical CHF: No Resp failure:No, [ ]  Pneumonia, [ ]  Ventilator Chg in renal function: No, [ ]  Inc. Cr > 0.5, [ ]  Temp. Dialysis, [ ]  Permanent dialysis Stroke: No, [ ]  Minor, [ ]  Major Return to OR: No  Reason for return to OR: [ ]  Bleeding, [ ]  Infection, [ ]  Thrombosis, [ ]  Revision  Discharge medications: Statin use:  yes ASA use:  yes Plavix use:  no Beta blocker use: yes Coumadin use: no, xarelto

## 2014-11-24 NOTE — Care Management (Signed)
Case Management Note  Patient Details  Name: [name] LauthRobert [name] MRN: [id number] Date of Birth: June 01, 1966  Subjective/Objective:     below knee popliteal exploration popliteal and tibial thrombectomy               Action/Plan: Lives at home with significant other, Ms Jodelle GreenWhitley  Expected Discharge Date:                  Expected Discharge Plan:  Home/Self Care  In-House Referral:     Discharge planning Services  CM Consult  Status of Service:  Completed, signed off  Medicare Important Message Given:  No Date Medicare IM Given:    Medicare IM give by:    Date Additional Medicare IM Given:    Additional Medicare Important Message give by:     If discussed at Long Length of Stay Meetings, dates discussed:    Additional Comments: NCM given permission by pt to speak to his significant other, Ms. Whitley. States she is at home to care pt. Made her aware that the pharmacy did have the Xarelto and his copay is $3.00. States he has wheelchair, RW, bedside commode and tub bench at home.  [name], [name], RN 11/24/2014, 10:06 AM

## 2014-11-24 NOTE — Progress Notes (Addendum)
  [facility] Progress Note  11/24/2014 7:31 AM 2 Days Post-Op  Subjective:  Doing well. Having some moderate pain.   Tmax  98.8 BP sys 120s-180s 100% RA  Filed Vitals:   11/24/14 0325  BP: 163/86  Pulse: 85  Temp: 98.8 F (37.1 C)  Resp: 18    Physical Exam: Incisions:  Dressing removed to right groin. Old blood on dressing. Right groin steri-strips intact. No active bleeding. No hematoma. Lower leg incision c/d/i.  Extremities:  Dopplerable right AT and PT. Dry gangrene to distal tip of right great toe stable. Palpable right ax-fem graft pulse.   CBC    Component Value Date/Time   WBC 7.4 11/24/2014 0331   RBC 3.50* 11/24/2014 0331   HGB 9.6* 11/24/2014 0331   HCT 29.4* 11/24/2014 0331   PLT 168 11/24/2014 0331   MCV 84.0 11/24/2014 0331   MCH 27.4 11/24/2014 0331   MCHC 32.7 11/24/2014 0331   RDW 14.9 11/24/2014 0331   LYMPHSABS 1.4 08/25/2014 0451   MONOABS 0.5 08/25/2014 0451   EOSABS 0.2 08/25/2014 0451   BASOSABS 0.0 08/25/2014 0451    BMET    Component Value Date/Time   NA 138 11/23/2014 0040   K 3.4* 11/23/2014 0040   CL 101 11/23/2014 0040   CO2 27 11/23/2014 0040   GLUCOSE 198* 11/23/2014 0040   BUN 11 11/23/2014 0040   CREATININE 0.99 11/23/2014 0040   CALCIUM 8.5* 11/23/2014 0040   GFRNONAA >60 11/23/2014 0040   GFRAA >60 11/23/2014 0040    INR    Component Value Date/Time   INR 1.08 11/22/2014 0940     Intake/Output Summary (Last 24 hours) at 11/24/14 0731 Last data filed at 11/24/14 0327  Gross per 24 hour  Intake   1170 ml  Output   2225 ml  Net  -1055 ml     Assessment:  49 y.o. male is s/p: #1 below knee popliteal exploration popliteal and tibial thrombectomy, #2 right groin exploration and removal of nonocclusive thrombus from the femoral anastomosis of right axillofemoral bypass  2 Days Post-Op  Plan: -Incisions are fine. Wound culture no growth to date.  -Doppler signals right PT and AT.  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 Marland Kitchen.  -D/c home today.    Maris BergerKimberly Trinh, PA-C [facility] Office: [phone] Pager: [phone] 11/24/2014 7:31 AM   Agree with above No hematoma  Xarelto approved by insurance Doppler signals in foot D/c  Fabienne Brunsharles Fields, MD [facility] [place] Office: [phone] Pager: [phone]

## 2014-11-24 NOTE — Progress Notes (Signed)
   11/24/14 0932  Spiritual Encounters  Spiritual Needs Emotional  Helped Healthcare staff with accommodating request.  Briefly spoke with spouse/significant other. Patient frustrated waiting on RX.

## 2014-11-24 NOTE — Telephone Encounter (Signed)
LM for pt re appt, dpm °

## 2014-11-25 LAB — WOUND CULTURE: CULTURE: NO GROWTH

## 2014-11-28 ENCOUNTER — Telehealth: Payer: Self-pay

## 2014-11-28 NOTE — Telephone Encounter (Signed)
Phone call from pt's significant other.  Reported pt. Is not really walking due to soreness in the right calf.  Questioned if this is normal?  Stated he has been doing leg exercises, but unable to walk more than a few steps due to c/o soreness of the calf.  Denied redness, swelling or inflammation.  Reported "blisters at ends of the steri strips, bilateral incision."  Denied fever/ chills.  Discussed with Dr. Arbie CookeyEarly.  No new orders; recommended that pt. wll have the tenderness @ this point in post-op period, and should gradually increase walking, as tolerated.  Advised it is okay to remove the steri-strips from the right lower leg incision.  Attempted to call pt's S.O.; left voice message with recommendations by Dr. Arbie CookeyEarly.

## 2014-12-01 ENCOUNTER — Telehealth: Payer: Self-pay | Admitting: *Deleted

## 2014-12-01 NOTE — Telephone Encounter (Signed)
Haywood LassoLynette called to report that Molly MaduroRobert is still not walking because of soreness in his calf muscle. His right leg appearance has not changed since she spoke to Gryglaarol on 11-28-14. He is afebrile and she reports that "his blood sugars are OK". 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 She said that he has taken his last "Oxy" yesterday but still has some gabapentin and Tramadol available to take. She is wanting us to refill his "oxy" until he is seen by his PCP next week. His next appt with Dr. Arbie CookeyEarly is on 12-12-14. I told Haywood LassoLynette that we did not have any doctors in the office today and that we couldn't refill his oxycodone at this time because Molly MaduroRobert would have to be seen and a written prescription was necessary for this type of pain medication. She said that he would just take his Tramadol and Gabapentin and "try to do the best he could until he sees his PCP next week".  She will continue with the same wound cleansing and dry dressing technique that was ordered previously. Haywood LassoLynette voiced understanding and agreement with this plan.

## 2014-12-08 ENCOUNTER — Encounter: Payer: Self-pay | Admitting: Vascular Surgery

## 2014-12-12 ENCOUNTER — Encounter: Payer: Self-pay | Admitting: Vascular Surgery

## 2014-12-15 ENCOUNTER — Encounter: Payer: Self-pay | Admitting: Vascular Surgery

## 2014-12-19 ENCOUNTER — Other Ambulatory Visit: Payer: Self-pay

## 2014-12-19 ENCOUNTER — Ambulatory Visit (INDEPENDENT_AMBULATORY_CARE_PROVIDER_SITE_OTHER): Payer: Self-pay | Admitting: Vascular Surgery

## 2014-12-19 ENCOUNTER — Encounter: Payer: Self-pay | Admitting: Vascular Surgery

## 2014-12-19 VITALS — BP 137/82 | HR 68 | Resp 16 | Ht 74.5 in | Wt 209.0 lb

## 2014-12-19 DIAGNOSIS — G8918 Other acute postprocedural pain: Secondary | ICD-10-CM

## 2014-12-19 MED ORDER — FENTANYL 12 MCG/HR TD PT72: 12.5000 ug | MEDICATED_PATCH | TRANSDERMAL | Status: DC

## 2014-12-19 NOTE — Progress Notes (Signed)
Here today for follow-up of extensive peripheral vascular occlusive disease. He is a wheelchair. He reports some stiffness in his right leg with inability to do a great deal of walking.  His popliteal incision is well-healed. He has a 2+ posterior tibial and popliteal pulse. His right great toe has dry gangrene on the tip and is becoming somewhat soupy at the base with some mild foul odor.  He had been placed on Keflex by primary care due to the erythema around this. I do not see any evidence of infection.  Discussed this with patient. Recommended that he undergo debridement of this in the operating room. Explained that he may require formal amputation of the tip of his great toe versus debridement of the eschar and continue local wound care.  He has requested the fentanyl patch and was given 12 mg 5 patches with no refills.  Her outpatient surgery on 6/29 

## 2014-12-26 ENCOUNTER — Encounter (HOSPITAL_COMMUNITY): Payer: Self-pay | Admitting: *Deleted

## 2014-12-26 MED ORDER — CEFUROXIME SODIUM 1.5 G IJ SOLR
1.5000 g | INTRAMUSCULAR | Status: AC
  Administered 2014-12-27: 1.5 g via INTRAVENOUS
  Filled 2014-12-26: qty 1.5

## 2014-12-26 NOTE — Progress Notes (Signed)
Mr [name] [name] denies having a cardiologist.  Patient reports that he was instructed by Dr early to only take 20 units of Lantus at bedtime tonight,  Xarelto was stopped on 12/23/14

## 2014-12-27 ENCOUNTER — Ambulatory Visit (HOSPITAL_COMMUNITY): Payer: Medicaid Other | Admitting: Anesthesiology

## 2014-12-27 ENCOUNTER — Encounter (HOSPITAL_COMMUNITY): Payer: Self-pay | Admitting: *Deleted

## 2014-12-27 ENCOUNTER — Ambulatory Visit (HOSPITAL_COMMUNITY)
Admission: RE | Admit: 2014-12-27 | Discharge: 2014-12-27 | Disposition: A | Discharge status: Home / Self Care | Payer: Medicaid Other | Source: Ambulatory Visit | Attending: Vascular Surgery | Admitting: Vascular Surgery

## 2014-12-27 ENCOUNTER — Encounter (HOSPITAL_COMMUNITY)
Admission: RE | Disposition: A | Discharge status: Home / Self Care | Payer: Self-pay | Source: Ambulatory Visit | Attending: Vascular Surgery

## 2014-12-27 DIAGNOSIS — I96 Gangrene, not elsewhere classified: Secondary | ICD-10-CM | POA: Diagnosis not present

## 2014-12-27 DIAGNOSIS — E119 Type 2 diabetes mellitus without complications: Secondary | ICD-10-CM | POA: Insufficient documentation

## 2014-12-27 DIAGNOSIS — K219 Gastro-esophageal reflux disease without esophagitis: Secondary | ICD-10-CM | POA: Insufficient documentation

## 2014-12-27 DIAGNOSIS — I10 Essential (primary) hypertension: Secondary | ICD-10-CM | POA: Diagnosis not present

## 2014-12-27 DIAGNOSIS — I70235 Atherosclerosis of native arteries of right leg with ulceration of other part of foot: Secondary | ICD-10-CM | POA: Diagnosis not present

## 2014-12-27 DIAGNOSIS — Z87891 Personal history of nicotine dependence: Secondary | ICD-10-CM | POA: Insufficient documentation

## 2014-12-27 DIAGNOSIS — Z8673 Personal history of transient ischemic attack (TIA), and cerebral infarction without residual deficits: Secondary | ICD-10-CM | POA: Diagnosis not present

## 2014-12-27 DIAGNOSIS — F319 Bipolar disorder, unspecified: Secondary | ICD-10-CM | POA: Diagnosis not present

## 2014-12-27 HISTORY — PX: WOUND DEBRIDEMENT: SHX247

## 2014-12-27 HISTORY — PX: AMPUTATION: SHX166

## 2014-12-27 LAB — COMPREHENSIVE METABOLIC PANEL
ALT: 21 U/L (ref 17–63)
ANION GAP: 11 (ref 5–15)
AST: 18 U/L (ref 15–41)
Albumin: 3.5 g/dL (ref 3.5–5.0)
Alkaline Phosphatase: 62 U/L (ref 38–126)
BUN: 12 mg/dL (ref 6–20)
CO2: 29 mmol/L (ref 22–32)
Calcium: 9.5 mg/dL (ref 8.9–10.3)
Chloride: 100 mmol/L — ABNORMAL LOW (ref 101–111)
Creatinine, Ser: 1 mg/dL (ref 0.61–1.24)
GLUCOSE: 164 mg/dL — AB (ref 65–99)
Potassium: 2.9 mmol/L — ABNORMAL LOW (ref 3.5–5.1)
Sodium: 140 mmol/L (ref 135–145)
TOTAL PROTEIN: 7.1 g/dL (ref 6.5–8.1)
Total Bilirubin: 0.3 mg/dL (ref 0.3–1.2)

## 2014-12-27 LAB — GLUCOSE, CAPILLARY
Glucose-Capillary: 162 mg/dL — ABNORMAL HIGH (ref 65–99)
Glucose-Capillary: 136 mg/dL — ABNORMAL HIGH (ref 65–99)

## 2014-12-27 LAB — CBC
HEMATOCRIT: 37.3 % — AB (ref 39.0–52.0)
HEMOGLOBIN: 12 g/dL — AB (ref 13.0–17.0)
MCH: 27.5 pg (ref 26.0–34.0)
MCHC: 32.2 g/dL (ref 30.0–36.0)
MCV: 85.6 fL (ref 78.0–100.0)
Platelets: 233 10*3/uL (ref 150–400)
RBC: 4.36 MIL/uL (ref 4.22–5.81)
RDW: 14.4 % (ref 11.5–15.5)
WBC: 5.5 10*3/uL (ref 4.0–10.5)

## 2014-12-27 LAB — PROTIME-INR
INR: 1.1 (ref 0.00–1.49)
PROTHROMBIN TIME: 14.4 s (ref 11.6–15.2)

## 2014-12-27 SURGERY — DEBRIDEMENT, WOUND
Anesthesia: General | Site: Toe | Laterality: Right

## 2014-12-27 MED ORDER — PROMETHAZINE HCL 25 MG/ML IJ SOLN: 6.2500 mg | INTRAMUSCULAR | Status: DC | PRN

## 2014-12-27 MED ORDER — OXYCODONE HCL 5 MG PO TABS
ORAL_TABLET | ORAL | Status: AC
  Administered 2014-12-27: 10 mg via ORAL
  Filled 2014-12-27: qty 2

## 2014-12-27 MED ORDER — ROCURONIUM BROMIDE 50 MG/5ML IV SOLN
INTRAVENOUS | Status: AC
  Filled 2014-12-27: qty 1

## 2014-12-27 MED ORDER — SODIUM CHLORIDE 0.9 % IV SOLN: INTRAVENOUS | Status: DC

## 2014-12-27 MED ORDER — EPHEDRINE SULFATE 50 MG/ML IJ SOLN
INTRAMUSCULAR | Status: AC
  Filled 2014-12-27: qty 1

## 2014-12-27 MED ORDER — OXYCODONE HCL 10 MG PO TABS: 10.0000 mg | ORAL_TABLET | ORAL | Status: DC | PRN

## 2014-12-27 MED ORDER — LIDOCAINE-EPINEPHRINE 0.5 %-1:200000 IJ SOLN
INTRAMUSCULAR | Status: DC | PRN
  Administered 2014-12-27: 3 mL

## 2014-12-27 MED ORDER — 0.9 % SODIUM CHLORIDE (POUR BTL) OPTIME
TOPICAL | Status: DC | PRN
  Administered 2014-12-27: 1000 mL

## 2014-12-27 MED ORDER — OXYCODONE HCL 5 MG PO TABS
10.0000 mg | ORAL_TABLET | Freq: Once | ORAL | Status: AC
  Administered 2014-12-27: 10 mg via ORAL

## 2014-12-27 MED ORDER — BUPIVACAINE-EPINEPHRINE (PF) 0.5% -1:200000 IJ SOLN
INTRAMUSCULAR | Status: DC | PRN
  Administered 2014-12-27: 30 mL via PERINEURAL

## 2014-12-27 MED ORDER — MIDAZOLAM HCL 2 MG/2ML IJ SOLN
INTRAMUSCULAR | Status: AC
  Filled 2014-12-27: qty 2

## 2014-12-27 MED ORDER — SUCCINYLCHOLINE CHLORIDE 20 MG/ML IJ SOLN
INTRAMUSCULAR | Status: AC
  Filled 2014-12-27: qty 1

## 2014-12-27 MED ORDER — LIDOCAINE HCL (CARDIAC) 20 MG/ML IV SOLN
INTRAVENOUS | Status: AC
  Filled 2014-12-27: qty 5

## 2014-12-27 MED ORDER — HYDROMORPHONE HCL 1 MG/ML IJ SOLN: 0.2500 mg | INTRAMUSCULAR | Status: DC | PRN

## 2014-12-27 MED ORDER — MIDAZOLAM HCL 5 MG/5ML IJ SOLN
INTRAMUSCULAR | Status: DC | PRN
  Administered 2014-12-27: 1 mg via INTRAVENOUS

## 2014-12-27 MED ORDER — LACTATED RINGERS IV SOLN
INTRAVENOUS | Status: DC | PRN
  Administered 2014-12-27: 08:00:00 via INTRAVENOUS

## 2014-12-27 MED ORDER — LIDOCAINE HCL (PF) 1 % IJ SOLN
INTRAMUSCULAR | Status: AC
  Filled 2014-12-27: qty 30

## 2014-12-27 MED ORDER — FENTANYL CITRATE (PF) 250 MCG/5ML IJ SOLN
INTRAMUSCULAR | Status: AC
  Filled 2014-12-27: qty 5

## 2014-12-27 MED ORDER — SODIUM CHLORIDE 0.9 % IV SOLN
INTRAVENOUS | Status: DC | PRN
  Administered 2014-12-27: 09:00:00 via INTRAVENOUS

## 2014-12-27 MED ORDER — LIDOCAINE-EPINEPHRINE 0.5 %-1:200000 IJ SOLN
INTRAMUSCULAR | Status: AC
  Filled 2014-12-27: qty 1

## 2014-12-27 MED ORDER — PROPOFOL 10 MG/ML IV BOLUS
INTRAVENOUS | Status: AC
  Filled 2014-12-27: qty 20

## 2014-12-27 MED ORDER — CHLORHEXIDINE GLUCONATE CLOTH 2 % EX PADS: 6.0000 | MEDICATED_PAD | Freq: Once | CUTANEOUS | Status: DC

## 2014-12-27 MED ORDER — FENTANYL CITRATE (PF) 100 MCG/2ML IJ SOLN
INTRAMUSCULAR | Status: DC | PRN
  Administered 2014-12-27: 50 ug via INTRAVENOUS

## 2014-12-27 MED ORDER — SODIUM CHLORIDE 0.9 % IJ SOLN
INTRAMUSCULAR | Status: AC
  Filled 2014-12-27: qty 10

## 2014-12-27 MED ORDER — RIVAROXABAN 20 MG PO TABS: 20.0000 mg | ORAL_TABLET | Freq: Every day | ORAL | Status: DC

## 2014-12-27 MED ORDER — HYDROMORPHONE HCL 1 MG/ML IJ SOLN
INTRAMUSCULAR | Status: AC
  Filled 2014-12-27: qty 1

## 2014-12-27 SURGICAL SUPPLY — 36 items
BNDG COHESIVE 3X5 TAN STRL LF (GAUZE/BANDAGES/DRESSINGS) ×3 IMPLANT
BNDG CONFORM 2 STRL LF (GAUZE/BANDAGES/DRESSINGS) ×3 IMPLANT
BNDG GAUZE ELAST 4 BULKY (GAUZE/BANDAGES/DRESSINGS) IMPLANT
CANISTER SUCTION 2500CC (MISCELLANEOUS) ×3 IMPLANT
CLIP LIGATING EXTRA MED SLVR (CLIP) IMPLANT
CLIP LIGATING EXTRA SM BLUE (MISCELLANEOUS) IMPLANT
COVER SURGICAL LIGHT HANDLE (MISCELLANEOUS) ×3 IMPLANT
DRAPE EXTREMITY T 121X128X90 (DRAPE) ×3 IMPLANT
ELECT REM PT RETURN 9FT ADLT (ELECTROSURGICAL) ×3
ELECTRODE REM PT RTRN 9FT ADLT (ELECTROSURGICAL) ×1 IMPLANT
GAUZE SPONGE 2X2 8PLY STRL LF (GAUZE/BANDAGES/DRESSINGS) ×1 IMPLANT
GAUZE SPONGE 4X4 12PLY STRL (GAUZE/BANDAGES/DRESSINGS) IMPLANT
GLOVE BIOGEL PI IND STRL 6.5 (GLOVE) ×1 IMPLANT
GLOVE BIOGEL PI INDICATOR 6.5 (GLOVE) ×2
GLOVE ECLIPSE 7.5 STRL STRAW (GLOVE) ×6 IMPLANT
GLOVE SS BIOGEL STRL SZ 7.5 (GLOVE) ×1 IMPLANT
GLOVE SUPERSENSE BIOGEL SZ 7.5 (GLOVE) ×2
GOWN STRL REUS W/ TWL LRG LVL3 (GOWN DISPOSABLE) ×1 IMPLANT
GOWN STRL REUS W/ TWL XL LVL3 (GOWN DISPOSABLE) ×2 IMPLANT
GOWN STRL REUS W/TWL LRG LVL3 (GOWN DISPOSABLE) ×2
GOWN STRL REUS W/TWL XL LVL3 (GOWN DISPOSABLE) ×4
KIT BASIN OR (CUSTOM PROCEDURE TRAY) ×3 IMPLANT
KIT ROOM TURNOVER OR (KITS) ×3 IMPLANT
NEEDLE 22X1 1/2 (OR ONLY) (NEEDLE) ×3 IMPLANT
NS IRRIG 1000ML POUR BTL (IV SOLUTION) ×3 IMPLANT
PACK GENERAL/GYN (CUSTOM PROCEDURE TRAY) ×3 IMPLANT
PAD ARMBOARD 7.5X6 YLW CONV (MISCELLANEOUS) ×3 IMPLANT
SPECIMEN JAR SMALL (MISCELLANEOUS) IMPLANT
SPONGE GAUZE 2X2 STER 10/PKG (GAUZE/BANDAGES/DRESSINGS) ×2
SUT ETHILON 3 0 PS 1 (SUTURE) ×6 IMPLANT
SUT VIC AB 3-0 SH 27 (SUTURE) ×2
SUT VIC AB 3-0 SH 27X BRD (SUTURE) ×1 IMPLANT
SYR CONTROL 10ML LL (SYRINGE) ×3 IMPLANT
TUBE ANAEROBIC SPECIMEN COL (MISCELLANEOUS) IMPLANT
UNDERPAD 30X30 INCONTINENT (UNDERPADS AND DIAPERS) ×3 IMPLANT
WATER STERILE IRR 1000ML POUR (IV SOLUTION) IMPLANT

## 2014-12-27 NOTE — Op Note (Signed)
    OPERATIVE REPORT  DATE OF SURGERY: 12/27/2014  PATIENT: [name] [name], 49 y.o. male MRN: [id number]  DOB: September 02, 1965  PRE-OPERATIVE DIAGNOSIS: Gangrene tip of right great toe  POST-OPERATIVE DIAGNOSIS:  Same  PROCEDURE: Partial right great toe amputation  SURGEON:  Gretta Beganodd Veora Fonte, M.D.  PHYSICIAN ASSISTANT: Nurse  ANESTHESIA:  Ankle block  EBL: Normal ml  Total I/O In: 500 [I.V.:500] Out: -   BLOOD ADMINISTERED: None  DRAINS: None  SPECIMEN: None  COUNTS CORRECT:  YES  PLAN OF CARE: PACU   PATIENT DISPOSITION:  PACU - hemodynamically stable  PROCEDURE DETAILS: Patient was taken to the operative room placed supine position. The right foot was prepped and draped in usual sterile fashion. The eschar was debrided and taken down to the bone. This reason the decision was made to do a distal toe amputation. A fishmouth incision was made in the mid great toe and carried down to the bone. The bone was divided with the Fredrik CoveRoger 7 was resected further proximally. There was good bleeding and no evidence of infection at this level. The bone was resected further proximally. Wound was irrigated with saline. Several 30 Michael sutures were used to close over the bone. The skin was closed with interrupted 3-0 nylon sutures.   Gretta Beganodd Terita Hejl, M.D. 12/27/2014 1:06 PM

## 2014-12-27 NOTE — Anesthesia Postprocedure Evaluation (Signed)
Anesthesia Post Note  Patient: [name] [name]  Procedure(s) Performed: Procedure(s) (LRB): DEBRIDEMENT AND IRRIGATION OF RIGHT FIRST TOE (Right) DISTAL RIGHT TOE  AMPUTATION (Right)  Anesthesia type: MAC  Patient location: PACU  Post pain: Pain level controlled  Post assessment: Patient's Cardiovascular Status Stable  Last Vitals:  Filed Vitals:   12/27/14 1018  BP: 110/65  Pulse: 63  Temp:   Resp:     Post vital signs: Reviewed and stable  Level of consciousness: sedated  Complications: No apparent anesthesia complications

## 2014-12-27 NOTE — Interval H&P Note (Signed)
History and Physical Interval Note:  12/27/2014 8:20 AM  [name] [name]  has presented today for surgery, with the diagnosis of Peripheral vascular disease with right great toe ulcer I70.235  The various methods of treatment have been discussed with the patient and family. After consideration of risks, benefits and other options for treatment, the patient has consented to  Procedure(s): DEBRIDEMENT FIRST TOE (Right) AMPUTATION FIRST TOE (Right) as a surgical intervention .  The patient's history has been reviewed, patient examined, no change in status, stable for surgery.  I have reviewed the patient's chart and labs.  Questions were answered to the patient's satisfaction.     Gretta BeganEarly, Todd

## 2014-12-27 NOTE — H&P (View-Only) (Signed)
Here today for follow-up of extensive peripheral vascular occlusive disease. He is a wheelchair. He reports some stiffness in his right leg with inability to do a great deal of walking.  His popliteal incision is well-healed. He has a 2+ posterior tibial and popliteal pulse. His right great toe has dry gangrene on the tip and is becoming somewhat soupy at the base with some mild foul odor.  He had been placed on Keflex by primary care due to the erythema around this. I do not see any evidence of infection.  Discussed this with patient. Recommended that he undergo debridement of this in the operating room. Explained that he may require formal amputation of the tip of his great toe versus debridement of the eschar and continue local wound care.  He has requested the fentanyl patch and was given 12 mg 5 patches with no refills.  Her outpatient surgery on 6/29

## 2014-12-27 NOTE — Transfer of Care (Signed)
Immediate Anesthesia Transfer of Care Note  Patient: [name] [name]  Procedure(s) Performed: Procedure(s): DEBRIDEMENT AND IRRIGATION OF RIGHT FIRST TOE (Right) DISTAL RIGHT TOE  AMPUTATION (Right)  Patient Location: PACU  Anesthesia Type:MAC  Level of Consciousness: awake, alert  and patient cooperative  Airway & Oxygen Therapy: Patient Spontanous Breathing and Patient connected to nasal cannula oxygen  Post-op Assessment: Report given to RN, Post -op Vital signs reviewed and stable and Patient moving all extremities  Post vital signs: Reviewed and stable  Last Vitals:  Filed Vitals:   12/27/14 0926  BP: 133/74  Pulse: 66  Temp: 36.7 C  Resp: 15    Complications: No apparent anesthesia complications

## 2014-12-27 NOTE — Anesthesia Preprocedure Evaluation (Addendum)
Anesthesia Evaluation  Patient identified by MRN, date of birth, ID band Patient awake    Reviewed: Allergy & Precautions, NPO status , Patient's Chart, lab work & pertinent test results  History of Anesthesia Complications Negative for: history of anesthetic complications  Airway Mallampati: II       Dental  (+) Edentulous Upper, Poor Dentition, Dental Advisory Given   Pulmonary former smoker,    Pulmonary exam normal       Cardiovascular hypertension, Rhythm:Regular     Neuro/Psych PSYCHIATRIC DISORDERS Anxiety Depression Bipolar Disorder CVA, Residual Symptoms    GI/Hepatic Neg liver ROS, GERD-  ,  Endo/Other  diabetes  Renal/GU negative Renal ROS     Musculoskeletal   Abdominal   Peds  Hematology   Anesthesia Other Findings   Reproductive/Obstetrics                           Anesthesia Physical Anesthesia Plan  ASA: III  Anesthesia Plan: General   Post-op Pain Management:    Induction: Intravenous  Airway Management Planned: LMA  Additional Equipment:   Intra-op Plan:   Post-operative Plan: Extubation in OR  Informed Consent:   Plan Discussed with:   Anesthesia Plan Comments:         Anesthesia Quick Evaluation

## 2014-12-27 NOTE — Anesthesia Procedure Notes (Addendum)
Procedure Name: MAC Date/Time: 12/27/2014 8:30 AM Performed by: Coralee RudFLORES, Hanish Pre-anesthesia Checklist: Patient identified Placement Confirmation: positive ETCO2   Anesthesia Regional Block:  Popliteal block  Pre-Anesthetic Checklist: ,, timeout performed, Correct Patient, Correct Site, Correct Laterality, Correct Procedure, Correct Position, site marked, Risks and benefits discussed,  Surgical consent,  Pre-op evaluation,  At surgeon's request and post-op pain management  Laterality: Right  Prep: chloraprep       Needles:  Injection technique: Single-shot  Needle Type: Echogenic Stimulator Needle          Additional Needles:  Procedures: ultrasound guided (picture in chart) and nerve stimulator Popliteal block  Nerve Stimulator or Paresthesia:  Response: plantar flexion, 0.45 mA,   Additional Responses:   Narrative:  Start time: 12/27/2014 8:08 AM End time: 12/27/2014 8:18 AM Injection made incrementally with aspirations every 5 mL.  Performed by: Personally  Anesthesiologist: Heather RobertsSINGER, Jaslynne Dahan  Additional Notes: A functioning IV was confirmed and monitors were applied.  Sterile prep and drape, hand hygiene and sterile gloves were used.  Negative aspiration and test dose prior to incremental administration of local anesthetic. The patient tolerated the procedure well.Ultrasound  guidance: relevant anatomy identified, needle position confirmed, local anesthetic spread visualized around nerve(s), vascular puncture avoided.  Image printed for medical record.

## 2014-12-28 ENCOUNTER — Telehealth: Payer: Self-pay | Admitting: Vascular Surgery

## 2014-12-28 ENCOUNTER — Encounter (HOSPITAL_COMMUNITY): Payer: Self-pay | Admitting: Vascular Surgery

## 2014-12-28 NOTE — Telephone Encounter (Signed)
Spoke with pt to schedule, dpm °

## 2014-12-28 NOTE — Telephone Encounter (Signed)
-----   Message from Sharee PimpleMarilyn K McChesney, RN sent at 12/27/2014  9:57 AM EDT ----- Regarding: Schedule   ----- Message -----    From: Lars MageEmma M Collins, PA-C    Sent: 12/27/2014   8:55 AM      To: [facility] Charge Pool  Toe debridement f/u with Dr. Arbie CookeyEarly in 2 weeks

## 2015-01-02 ENCOUNTER — Encounter: Payer: Self-pay | Admitting: Vascular Surgery

## 2015-01-05 ENCOUNTER — Encounter: Payer: Self-pay | Admitting: Vascular Surgery

## 2015-01-09 ENCOUNTER — Encounter: Payer: Self-pay | Admitting: Vascular Surgery

## 2015-01-12 ENCOUNTER — Encounter: Payer: Self-pay | Admitting: Vascular Surgery

## 2015-01-16 ENCOUNTER — Encounter: Payer: Self-pay | Admitting: Vascular Surgery

## 2015-01-16 ENCOUNTER — Ambulatory Visit (INDEPENDENT_AMBULATORY_CARE_PROVIDER_SITE_OTHER): Payer: Self-pay | Admitting: Vascular Surgery

## 2015-01-16 VITALS — BP 123/76 | HR 73 | Temp 97.6°F | Resp 18 | Ht 74.5 in | Wt 210.0 lb

## 2015-01-16 DIAGNOSIS — I739 Peripheral vascular disease, unspecified: Secondary | ICD-10-CM

## 2015-01-16 DIAGNOSIS — Z48812 Encounter for surgical aftercare following surgery on the circulatory system: Secondary | ICD-10-CM

## 2015-01-16 NOTE — Progress Notes (Addendum)
Her today for partial great toe amputation follow-up from his right foot. He has an extensive past history with prior aortofemoral bypass which failed and subsequent acts femorofemoral bypass was some failure of this revascularization of the last several months. He did develop diarrhea changes of the tip of his right great toe and underwent the amputation of tenderness by myself on 12/27/2014. He is been a no-show for 2 appointments since that time. He is here today for his initial follow-up which is 3 weeks out from surgery. His original surgical dressing was removed today. I despite this he does have excellent healing of his distal amputation. Sutures will be removed today.   His foot is well perfused. He has easily palpable axillofemoral pulse. We'll continue his usual activities without limitation. We will see him again in 3 months for continued follow-up and noninvasive vascular lab studies.   his significant other/caregiver was with him today. She requested additional  prescription for fentanyl. I declined. Explained that this since he is 3 weeks out from amputation that would recommend Advil.

## 2015-01-17 NOTE — Addendum Note (Signed)
Addended by: Adria DillELDRIDGE-LEWIS, Eloisa Chokshi L on: 01/17/2015 11:16 AM   Modules accepted: Orders

## 2015-04-19 ENCOUNTER — Other Ambulatory Visit: Payer: Self-pay | Admitting: Vascular Surgery

## 2015-05-04 ENCOUNTER — Encounter: Payer: Self-pay | Admitting: Vascular Surgery

## 2015-05-08 ENCOUNTER — Ambulatory Visit (HOSPITAL_COMMUNITY)
Admission: RE | Admit: 2015-05-08 | Discharge: 2015-05-08 | Disposition: A | Discharge status: Home / Self Care | Payer: Medicaid Other | Source: Ambulatory Visit | Attending: Vascular Surgery | Admitting: Vascular Surgery

## 2015-05-08 ENCOUNTER — Ambulatory Visit (INDEPENDENT_AMBULATORY_CARE_PROVIDER_SITE_OTHER): Payer: Medicaid Other | Admitting: Vascular Surgery

## 2015-05-08 ENCOUNTER — Ambulatory Visit (INDEPENDENT_AMBULATORY_CARE_PROVIDER_SITE_OTHER)
Admission: RE | Admit: 2015-05-08 | Discharge: 2015-05-08 | Disposition: A | Discharge status: Home / Self Care | Payer: Medicaid Other | Source: Ambulatory Visit | Attending: Vascular Surgery | Admitting: Vascular Surgery

## 2015-05-08 ENCOUNTER — Encounter: Payer: Self-pay | Admitting: Vascular Surgery

## 2015-05-08 VITALS — BP 133/93 | HR 77 | Temp 97.1°F | Resp 18 | Ht 72.5 in | Wt 213.0 lb

## 2015-05-08 DIAGNOSIS — I739 Peripheral vascular disease, unspecified: Secondary | ICD-10-CM | POA: Diagnosis not present

## 2015-05-08 DIAGNOSIS — Z48812 Encounter for surgical aftercare following surgery on the circulatory system: Secondary | ICD-10-CM | POA: Insufficient documentation

## 2015-05-08 NOTE — Progress Notes (Signed)
[facility]  Patient name: [name] [name] MRN: [id number] DOB: 03-09-1966 Sex: male  REASON FOR VISIT: Follow-up of right axillofemoral and femorofemoral bypass and right great toe partial amputation  HPI: [name] [name] is a 49 y.o. male is today for follow-up of his extensive prior peripheral vascular disease. He looks quite good today. He has no complaints of pain and has no lower extremity tissue loss. 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  Past Medical History  Diagnosis Date  . GERD (gastroesophageal reflux disease)   . Nicotine addiction   . ETOH abuse   . Anxiety   . Hypertension     no pcp     saw peter nisham once  . Peripheral vascular disease (HCC)   . Hyperlipidemia   . Collagen vascular disease (HCC)   . Type II diabetes mellitus (HCC)   . History of blood transfusion 08/2013    "maybe when he had his amputation"  . Chronic lower back pain   . Depression   . Bipolar disorder (HCC)   . PAD (peripheral artery disease) (HCC)   . Stroke (HCC) 08/11/11    "slurred speech; no feeling elbow down on the right; lost hearing left ear" (08/16/2014)Right arm numb,     Family History  Problem Relation Age of Onset  . Hypertension Mother     SOCIAL HISTORY: Social History  Substance Use Topics  . Smoking status: Former Smoker -- 0.12 packs/day for 18 years    Types: Cigarettes    Quit date: 09/12/2014  . Smokeless tobacco: Never Used  . Alcohol Use: No     Comment: 08/15/2014 "stopped drinking NYE 2015; used to drink 1/2 gallon vodka/d Last drink 07/06/14    No Known Allergies  Current Outpatient Prescriptions  Medication Sig Dispense Refill  . ALPRAZolam (XANAX) 1 MG tablet Take 0.5 tablets (0.5 mg total) by mouth 3 (three) times daily. (Patient taking differently: Take 1 mg by mouth 3 (three) times daily. ) 90 tablet 0  . amitriptyline (ELAVIL) 25 MG tablet Take 25 mg by mouth at bedtime.    Marland Kitchen  amphetamine-dextroamphetamine (ADDERALL XR) 30 MG 24 hr capsule Take 30 mg by mouth daily.    Marland Kitchen aspirin 325 MG EC tablet Take 325 mg by mouth daily.    Marland Kitchen atenolol (TENORMIN) 50 MG tablet Take 1 tablet (50 mg total) by mouth 2 (two) times daily.    Marland Kitchen atorvastatin (LIPITOR) 40 MG tablet Take 40 mg by mouth every morning.    Marland Kitchen buPROPion (WELLBUTRIN) 100 MG tablet Take 1 tablet (100 mg total) by mouth 2 (two) times daily.    . cloNIDine (CATAPRES) 0.1 MG tablet Take 0.1 mg by mouth 2 (two) times daily.    Marland Kitchen gabapentin (NEURONTIN) 300 MG capsule Take 1 capsule (300 mg total) by mouth 3 (three) times daily. (Patient taking differently: Take 600 mg by mouth 3 (three) times daily. ) 90 capsule 1  . hydrochlorothiazide (HYDRODIURIL) 50 MG tablet Take 0.5 tablets (25 mg total) by mouth daily. (Patient taking differently: Take 50 mg by mouth daily. )    . insulin aspart protamine- aspart (NOVOLOG MIX 70/30) (70-30) 100 UNIT/ML injection Inject 0-14 Units into the skin See admin instructions. 150-199=4 units 200-249=6 units 250-299=8 units 300-349=12 units 350-400=14 units    . insulin glargine (LANTUS) 100 UNIT/ML injection Inject 0.4 mLs (40 Units total) into the skin at bedtime. 10 mL 11  . Iron-FA-B Cmp-C-Biot-Probiotic (  FUSION PLUS) CAPS Take by mouth daily.    Marland Kitchen. lisinopril (PRINIVIL,ZESTRIL) 20 MG tablet Take 20 mg by mouth 2 (two) times daily.    . metFORMIN (GLUCOPHAGE) 850 MG tablet Take 850 mg by mouth 3 (three) times daily.    . Multiple Vitamin (MULTIVITAMIN WITH MINERALS) TABS tablet Take 1 tablet by mouth daily.    Marland Kitchen. omeprazole (PRILOSEC) 20 MG capsule Take 20 mg by mouth daily.    . Oxycodone HCl 10 MG TABS Take 1 tablet (10 mg total) by mouth every 4 (four) hours as needed. (Patient taking differently: Take 10 mg by mouth every 6 (six) hours as needed (pain). ) 30 tablet 0  . potassium chloride (K-DUR) 10 MEQ tablet Take 10 mEq by mouth daily.    . QUEtiapine (SEROQUEL) 100 MG tablet Take  300 mg by mouth at bedtime.   5  . rivaroxaban (XARELTO) 20 MG TABS tablet Take 1 tablet (20 mg total) by mouth daily with supper. Please start after you finish your xarelto starter pack. 30 tablet 6  . rivaroxaban (XARELTO) 20 MG TABS tablet Take 1 tablet (20 mg total) by mouth daily with supper. 30 tablet 0  . traMADol (ULTRAM) 50 MG tablet Take 1 tablet (50 mg total) by mouth every 6 (six) hours as needed for moderate pain. 20 tablet 0  . Oxycodone HCl 10 MG TABS Take 1 tablet (10 mg total) by mouth every 4 (four) hours as needed. (Patient not taking: Reported on 05/08/2015) 30 tablet 0   No current facility-administered medications for this visit.    REVIEW OF SYSTEMS:  [X]  denotes positive finding, [ ]  denotes negative finding Cardiac  Comments:  Chest pain or chest pressure:    Shortness of breath upon exertion:    Short of breath when lying flat:    Irregular heart rhythm:        Vascular    Pain in calf, thigh, or hip brought on by ambulation:    Pain in feet at night that wakes you up from your sleep:     Blood clot in your veins:    Leg swelling:         Pulmonary    Oxygen at home:    Productive cough:     Wheezing:         Neurologic    Sudden weakness in arms or legs:     Sudden numbness in arms or legs:  x   Sudden onset of difficulty speaking or slurred speech:    Temporary loss of vision in one eye:     Problems with dizziness:         Gastrointestinal    Blood in stool:     Vomited blood:         Genitourinary    Burning when urinating:     Blood in urine:        Psychiatric    Major depression:  x       Hematologic    Bleeding problems:    Problems with blood clotting too easily:        Skin    Rashes or ulcers:        Constitutional    Fever or chills:      PHYSICAL EXAM: Filed Vitals:   05/08/15 1336 05/08/15 1339  BP: 143/97 133/93  Pulse: 77 77  Temp: 97.1 F (36.2 C)   Resp: 18   Height: 6' 0.5" (1.842 m)   Weight:  213 lb (96.616  kg)   SpO2: 97%     GENERAL: The patient is a well-nourished male, in no acute distress. The vital signs are documented above. VASCULAR: Palpable accident in femorofemoral graft pulse. PULMONARY: There is good air exchange bilaterally without wheezing or rales. ABDOMEN: Soft and non-tender MUSCULOSKELETAL: Left above-knee amputation. Well-healed right partial toe amputation on the first toe. Does have some under turning of his second toe with no tissue loss NEUROLOGIC: No focal weakness or paresthesias are detected. SKIN: There are no ulcers or rashes noted. PSYCHIATRIC: The patient has a normal affect.  DATA:  Patent axillofemoral and femorofemoral bypass. Biphasic posterior tibial and dorsalis pedis with ankle arm index of 0.89 on the right  MEDICAL ISSUES: Stable overall. We'll continue his usual activities. We will see him again in one year with repeat noninvasive follow-up studies. He would notify should he develop any ischemic symptoms No Follow-up on file.   Gretta Began [facility]: [phone]

## 2015-05-08 NOTE — Progress Notes (Signed)
Filed Vitals:   05/08/15 1336 05/08/15 1339  BP: 143/97 133/93  Pulse: 77 77  Temp: 97.1 F (36.2 C)   Resp: 18   Height: 6' 0.5" (1.842 m)   Weight: 213 lb (96.616 kg)   SpO2: 97%

## 2015-05-22 ENCOUNTER — Encounter: Payer: Self-pay | Admitting: Physical Therapy

## 2015-05-22 NOTE — Therapy (Signed)
[facility] [address] [place], [place], [address] Phone: [phone]   Fax:  [phone]  Patient Details  Name: Britton Bera MRN: [id number] Date of Birth: [birth year] Referring Provider:  No ref. provider found  Encounter Date: 05/22/2015   PHYSICAL THERAPY DISCHARGE SUMMARY  Visits from Start of Care: 4  Current functional level related to goals / functional outcomes: Patient did not return for scheduled PT appointments so unknown.    Remaining deficits: Patient did not return for scheduled PT appointments so unknown.      Education / Equipment: HEP Plan: Patient agrees to discharge.  Patient goals were not met. Patient is being discharged due to not returning since the last visit.  ?????       Shenaya Lebo  PT, DPT  05/22/2015, 12:17 PM  [facility] [address] [place], [place], [address] Phone: [phone]   Fax:  [phone]

## 2015-10-02 ENCOUNTER — Encounter (HOSPITAL_COMMUNITY): Payer: Self-pay | Admitting: Emergency Medicine

## 2015-10-02 ENCOUNTER — Inpatient Hospital Stay (HOSPITAL_COMMUNITY): Payer: Medicaid Other

## 2015-10-02 ENCOUNTER — Emergency Department (HOSPITAL_COMMUNITY): Payer: Medicaid Other | Admitting: Certified Registered Nurse Anesthetist

## 2015-10-02 ENCOUNTER — Encounter (HOSPITAL_COMMUNITY)
Admission: EM | Disposition: A | Discharge status: Home / Self Care | Payer: Self-pay | Source: Home / Self Care | Attending: Vascular Surgery

## 2015-10-02 ENCOUNTER — Emergency Department (HOSPITAL_COMMUNITY): Payer: Medicaid Other

## 2015-10-02 ENCOUNTER — Inpatient Hospital Stay (HOSPITAL_COMMUNITY)
Admission: EM | Admit: 2015-10-02 | Discharge: 2015-10-05 | DRG: 253 | Disposition: A | Discharge status: Home / Self Care | Payer: Medicaid Other | Attending: Vascular Surgery | Admitting: Vascular Surgery

## 2015-10-02 DIAGNOSIS — F411 Generalized anxiety disorder: Secondary | ICD-10-CM | POA: Diagnosis present

## 2015-10-02 DIAGNOSIS — Z7982 Long term (current) use of aspirin: Secondary | ICD-10-CM

## 2015-10-02 DIAGNOSIS — Y832 Surgical operation with anastomosis, bypass or graft as the cause of abnormal reaction of the patient, or of later complication, without mention of misadventure at the time of the procedure: Secondary | ICD-10-CM | POA: Diagnosis present

## 2015-10-02 DIAGNOSIS — Z8659 Personal history of other mental and behavioral disorders: Secondary | ICD-10-CM

## 2015-10-02 DIAGNOSIS — Z7901 Long term (current) use of anticoagulants: Secondary | ICD-10-CM

## 2015-10-02 DIAGNOSIS — F17210 Nicotine dependence, cigarettes, uncomplicated: Secondary | ICD-10-CM | POA: Diagnosis present

## 2015-10-02 DIAGNOSIS — I10 Essential (primary) hypertension: Secondary | ICD-10-CM | POA: Diagnosis present

## 2015-10-02 DIAGNOSIS — Z794 Long term (current) use of insulin: Secondary | ICD-10-CM

## 2015-10-02 DIAGNOSIS — Z89612 Acquired absence of left leg above knee: Secondary | ICD-10-CM | POA: Diagnosis not present

## 2015-10-02 DIAGNOSIS — Z09 Encounter for follow-up examination after completed treatment for conditions other than malignant neoplasm: Secondary | ICD-10-CM

## 2015-10-02 DIAGNOSIS — I7090 Unspecified atherosclerosis: Secondary | ICD-10-CM

## 2015-10-02 DIAGNOSIS — K219 Gastro-esophageal reflux disease without esophagitis: Secondary | ICD-10-CM | POA: Diagnosis present

## 2015-10-02 DIAGNOSIS — T82868A Thrombosis of vascular prosthetic devices, implants and grafts, initial encounter: Principal | ICD-10-CM | POA: Diagnosis present

## 2015-10-02 DIAGNOSIS — I739 Peripheral vascular disease, unspecified: Secondary | ICD-10-CM | POA: Diagnosis not present

## 2015-10-02 DIAGNOSIS — E1151 Type 2 diabetes mellitus with diabetic peripheral angiopathy without gangrene: Secondary | ICD-10-CM | POA: Diagnosis present

## 2015-10-02 DIAGNOSIS — F319 Bipolar disorder, unspecified: Secondary | ICD-10-CM | POA: Diagnosis present

## 2015-10-02 DIAGNOSIS — Z8673 Personal history of transient ischemic attack (TIA), and cerebral infarction without residual deficits: Secondary | ICD-10-CM | POA: Diagnosis not present

## 2015-10-02 DIAGNOSIS — R5381 Other malaise: Secondary | ICD-10-CM | POA: Diagnosis not present

## 2015-10-02 DIAGNOSIS — I743 Embolism and thrombosis of arteries of the lower extremities: Secondary | ICD-10-CM | POA: Diagnosis present

## 2015-10-02 DIAGNOSIS — E785 Hyperlipidemia, unspecified: Secondary | ICD-10-CM | POA: Diagnosis present

## 2015-10-02 DIAGNOSIS — I998 Other disorder of circulatory system: Secondary | ICD-10-CM | POA: Diagnosis present

## 2015-10-02 DIAGNOSIS — T82818A Embolism of vascular prosthetic devices, implants and grafts, initial encounter: Secondary | ICD-10-CM

## 2015-10-02 HISTORY — PX: INTRAOPERATIVE ARTERIOGRAM: SHX5157

## 2015-10-02 HISTORY — PX: AXILLARY-FEMORAL BYPASS GRAFT: SHX894

## 2015-10-02 HISTORY — PX: PATCH ANGIOPLASTY: SHX6230

## 2015-10-02 LAB — POCT I-STAT EG7
Acid-Base Excess: 1 mmol/L (ref 0.0–2.0)
Bicarbonate: 26.4 mEq/L — ABNORMAL HIGH (ref 20.0–24.0)
Calcium, Ion: 1.14 mmol/L (ref 1.12–1.23)
HEMATOCRIT: 29 % — AB (ref 39.0–52.0)
HEMOGLOBIN: 9.9 g/dL — AB (ref 13.0–17.0)
O2 Saturation: 87 %
PCO2 VEN: 45.6 mmHg (ref 45.0–50.0)
PO2 VEN: 55 mmHg — AB (ref 31.0–45.0)
POTASSIUM: 3.6 mmol/L (ref 3.5–5.1)
Sodium: 140 mmol/L (ref 135–145)
TCO2: 28 mmol/L (ref 0–100)
pH, Ven: 7.371 — ABNORMAL HIGH (ref 7.250–7.300)

## 2015-10-02 LAB — MRSA PCR SCREENING: MRSA BY PCR: NEGATIVE

## 2015-10-02 LAB — CBC WITH DIFFERENTIAL/PLATELET
BASOS PCT: 0 %
Basophils Absolute: 0 10*3/uL (ref 0.0–0.1)
EOS ABS: 0.1 10*3/uL (ref 0.0–0.7)
Eosinophils Relative: 1 %
HEMATOCRIT: 36 % — AB (ref 39.0–52.0)
HEMOGLOBIN: 11.8 g/dL — AB (ref 13.0–17.0)
LYMPHS ABS: 2.3 10*3/uL (ref 0.7–4.0)
Lymphocytes Relative: 21 %
MCH: 27.1 pg (ref 26.0–34.0)
MCHC: 32.8 g/dL (ref 30.0–36.0)
MCV: 82.6 fL (ref 78.0–100.0)
Monocytes Absolute: 0.6 10*3/uL (ref 0.1–1.0)
Monocytes Relative: 6 %
NEUTROS ABS: 7.9 10*3/uL — AB (ref 1.7–7.7)
NEUTROS PCT: 72 %
Platelets: 195 10*3/uL (ref 150–400)
RBC: 4.36 MIL/uL (ref 4.22–5.81)
RDW: 14 % (ref 11.5–15.5)
WBC: 10.9 10*3/uL — AB (ref 4.0–10.5)

## 2015-10-02 LAB — BASIC METABOLIC PANEL
Anion gap: 13 (ref 5–15)
BUN: 11 mg/dL (ref 6–20)
CHLORIDE: 102 mmol/L (ref 101–111)
CO2: 24 mmol/L (ref 22–32)
Calcium: 9.9 mg/dL (ref 8.9–10.3)
Creatinine, Ser: 0.91 mg/dL (ref 0.61–1.24)
GFR calc non Af Amer: >60 mL/min (ref >60)
Glucose, Bld: 129 mg/dL — ABNORMAL HIGH (ref 65–99)
POTASSIUM: 3.3 mmol/L — AB (ref 3.5–5.1)
Sodium: 139 mmol/L (ref 135–145)

## 2015-10-02 LAB — GLUCOSE, CAPILLARY
GLUCOSE-CAPILLARY: 177 mg/dL — AB (ref 65–99)
Glucose-Capillary: 113 mg/dL — ABNORMAL HIGH (ref 65–99)

## 2015-10-02 LAB — HEPARIN LEVEL (UNFRACTIONATED): HEPARIN UNFRACTIONATED: 0.25 [IU]/mL — AB (ref 0.30–0.70)

## 2015-10-02 LAB — PROTIME-INR
INR: 1.23 (ref 0.00–1.49)
Prothrombin Time: 15.7 seconds — ABNORMAL HIGH (ref 11.6–15.2)

## 2015-10-02 LAB — TYPE AND SCREEN
ABO/RH(D): A POS
Antibody Screen: NEGATIVE

## 2015-10-02 LAB — APTT: APTT: 27 s (ref 24–37)

## 2015-10-02 SURGERY — CREATION, BYPASS, ARTERIAL, AXILLARY TO BILATERAL FEMORAL, USING GRAFT
Anesthesia: General | Site: Leg Upper | Laterality: Right

## 2015-10-02 MED ORDER — PHENOL 1.4 % MT LIQD: 1.0000 | OROMUCOSAL | Status: DC | PRN

## 2015-10-02 MED ORDER — ACETAMINOPHEN 650 MG RE SUPP: 325.0000 mg | RECTAL | Status: DC | PRN

## 2015-10-02 MED ORDER — POTASSIUM CHLORIDE CRYS ER 20 MEQ PO TBCR: 20.0000 meq | EXTENDED_RELEASE_TABLET | Freq: Every day | ORAL | Status: DC | PRN

## 2015-10-02 MED ORDER — SUGAMMADEX SODIUM 200 MG/2ML IV SOLN
INTRAVENOUS | Status: AC
  Filled 2015-10-02: qty 2

## 2015-10-02 MED ORDER — SENNOSIDES-DOCUSATE SODIUM 8.6-50 MG PO TABS
1.0000 | ORAL_TABLET | Freq: Every evening | ORAL | Status: DC | PRN
  Administered 2015-10-04 (×2): 1 via ORAL
  Filled 2015-10-02 (×2): qty 1

## 2015-10-02 MED ORDER — PROPOFOL 10 MG/ML IV BOLUS
INTRAVENOUS | Status: DC | PRN
  Administered 2015-10-02: 150 mg via INTRAVENOUS

## 2015-10-02 MED ORDER — ONDANSETRON HCL 4 MG/2ML IJ SOLN: 4.0000 mg | Freq: Four times a day (QID) | INTRAMUSCULAR | Status: DC | PRN

## 2015-10-02 MED ORDER — ONDANSETRON HCL 4 MG/2ML IJ SOLN
INTRAMUSCULAR | Status: DC | PRN
  Administered 2015-10-02: 4 mg via INTRAVENOUS

## 2015-10-02 MED ORDER — GUAIFENESIN-DM 100-10 MG/5ML PO SYRP: 15.0000 mL | ORAL_SOLUTION | ORAL | Status: DC | PRN

## 2015-10-02 MED ORDER — OXYCODONE HCL 5 MG PO TABS
5.0000 mg | ORAL_TABLET | ORAL | Status: DC | PRN
  Administered 2015-10-02: 10 mg via ORAL
  Administered 2015-10-03: 5 mg via ORAL
  Administered 2015-10-03: 10 mg via ORAL
  Administered 2015-10-03: 5 mg via ORAL
  Administered 2015-10-03 – 2015-10-05 (×6): 10 mg via ORAL
  Filled 2015-10-02 (×9): qty 2

## 2015-10-02 MED ORDER — HEPARIN SODIUM (PORCINE) 1000 UNIT/ML IJ SOLN
INTRAMUSCULAR | Status: DC | PRN
  Administered 2015-10-02 (×2): 10000 [IU] via INTRAVENOUS
  Administered 2015-10-02: 5000 [IU] via INTRAVENOUS

## 2015-10-02 MED ORDER — VECURONIUM BROMIDE 10 MG IV SOLR
INTRAVENOUS | Status: DC | PRN
  Administered 2015-10-02 (×2): 2 mg via INTRAVENOUS
  Administered 2015-10-02: 1 mg via INTRAVENOUS
  Administered 2015-10-02 (×2): 2 mg via INTRAVENOUS
  Administered 2015-10-02: 3 mg via INTRAVENOUS

## 2015-10-02 MED ORDER — HYDROMORPHONE HCL 1 MG/ML IJ SOLN
0.5000 mg | INTRAMUSCULAR | Status: DC | PRN
  Administered 2015-10-03 (×2): 1 mg via INTRAVENOUS
  Filled 2015-10-02 (×2): qty 1

## 2015-10-02 MED ORDER — PANTOPRAZOLE SODIUM 40 MG PO TBEC
40.0000 mg | DELAYED_RELEASE_TABLET | Freq: Every day | ORAL | Status: DC
  Administered 2015-10-03 – 2015-10-05 (×3): 40 mg via ORAL
  Filled 2015-10-02 (×3): qty 1

## 2015-10-02 MED ORDER — ALUM & MAG HYDROXIDE-SIMETH 200-200-20 MG/5ML PO SUSP: 15.0000 mL | ORAL | Status: DC | PRN

## 2015-10-02 MED ORDER — DEXTROSE 5 % IV SOLN
1.5000 g | INTRAVENOUS | Status: AC
  Administered 2015-10-02 (×2): 1.5 g via INTRAVENOUS
  Filled 2015-10-02: qty 1.5

## 2015-10-02 MED ORDER — ATENOLOL 25 MG PO TABS
50.0000 mg | ORAL_TABLET | Freq: Two times a day (BID) | ORAL | Status: DC
  Administered 2015-10-03 – 2015-10-05 (×5): 50 mg via ORAL
  Filled 2015-10-02 (×3): qty 2
  Filled 2015-10-02 (×2): qty 1
  Filled 2015-10-02: qty 2

## 2015-10-02 MED ORDER — BISACODYL 10 MG RE SUPP: 10.0000 mg | Freq: Every day | RECTAL | Status: DC | PRN

## 2015-10-02 MED ORDER — METOPROLOL TARTRATE 1 MG/ML IV SOLN: 2.0000 mg | INTRAVENOUS | Status: DC | PRN

## 2015-10-02 MED ORDER — POTASSIUM CHLORIDE CRYS ER 10 MEQ PO TBCR
10.0000 meq | EXTENDED_RELEASE_TABLET | Freq: Every day | ORAL | Status: DC
  Administered 2015-10-03 – 2015-10-05 (×3): 10 meq via ORAL
  Filled 2015-10-02 (×3): qty 1

## 2015-10-02 MED ORDER — CLONIDINE HCL 0.1 MG PO TABS
0.1000 mg | ORAL_TABLET | Freq: Two times a day (BID) | ORAL | Status: DC
  Administered 2015-10-03 – 2015-10-05 (×5): 0.1 mg via ORAL
  Filled 2015-10-02 (×6): qty 1

## 2015-10-02 MED ORDER — IOPAMIDOL (ISOVUE-300) INJECTION 61%
INTRAVENOUS | Status: AC
  Filled 2015-10-02: qty 50

## 2015-10-02 MED ORDER — QUETIAPINE FUMARATE 300 MG PO TABS
300.0000 mg | ORAL_TABLET | Freq: Every day | ORAL | Status: DC
  Administered 2015-10-03 – 2015-10-05 (×2): 300 mg via ORAL
  Filled 2015-10-02 (×2): qty 1
  Filled 2015-10-02: qty 6

## 2015-10-02 MED ORDER — LIDOCAINE HCL (CARDIAC) 20 MG/ML IV SOLN
INTRAVENOUS | Status: DC | PRN
  Administered 2015-10-02: 70 mg via INTRAVENOUS

## 2015-10-02 MED ORDER — DEXTROSE 5 % IV SOLN
1.5000 g | Freq: Two times a day (BID) | INTRAVENOUS | Status: AC
  Administered 2015-10-03 (×2): 1.5 g via INTRAVENOUS
  Filled 2015-10-02 (×2): qty 1.5

## 2015-10-02 MED ORDER — HEPARIN BOLUS VIA INFUSION
4000.0000 [IU] | Freq: Once | INTRAVENOUS | Status: AC
  Administered 2015-10-02: 4000 [IU] via INTRAVENOUS
  Filled 2015-10-02: qty 4000

## 2015-10-02 MED ORDER — OXYCODONE HCL 5 MG/5ML PO SOLN: 5.0000 mg | Freq: Once | ORAL | Status: DC | PRN

## 2015-10-02 MED ORDER — THROMBIN 20000 UNITS EX SOLR
CUTANEOUS | Status: AC
  Filled 2015-10-02: qty 20000

## 2015-10-02 MED ORDER — HYDROMORPHONE HCL 1 MG/ML IJ SOLN: 0.2500 mg | INTRAMUSCULAR | Status: DC | PRN

## 2015-10-02 MED ORDER — ENOXAPARIN SODIUM 40 MG/0.4ML ~~LOC~~ SOLN: 40.0000 mg | SUBCUTANEOUS | Status: DC

## 2015-10-02 MED ORDER — PHENYLEPHRINE HCL 10 MG/ML IJ SOLN
10.0000 mg | INTRAVENOUS | Status: DC | PRN
  Administered 2015-10-02: 15 ug/min via INTRAVENOUS

## 2015-10-02 MED ORDER — SODIUM CHLORIDE 0.9 % IV SOLN
INTRAVENOUS | Status: DC | PRN
  Administered 2015-10-02: 13:00:00

## 2015-10-02 MED ORDER — HYDROCHLOROTHIAZIDE 25 MG PO TABS
50.0000 mg | ORAL_TABLET | Freq: Every day | ORAL | Status: DC
  Administered 2015-10-03 – 2015-10-05 (×3): 50 mg via ORAL
  Filled 2015-10-02 (×3): qty 2

## 2015-10-02 MED ORDER — MIDAZOLAM HCL 2 MG/2ML IJ SOLN
INTRAMUSCULAR | Status: AC
  Filled 2015-10-02: qty 2

## 2015-10-02 MED ORDER — MAGNESIUM SULFATE 2 GM/50ML IV SOLN
2.0000 g | Freq: Every day | INTRAVENOUS | Status: DC | PRN
  Filled 2015-10-02: qty 50

## 2015-10-02 MED ORDER — LISINOPRIL 10 MG PO TABS
20.0000 mg | ORAL_TABLET | Freq: Two times a day (BID) | ORAL | Status: DC
  Administered 2015-10-03 – 2015-10-05 (×5): 20 mg via ORAL
  Filled 2015-10-02 (×2): qty 2
  Filled 2015-10-02: qty 1
  Filled 2015-10-02: qty 2
  Filled 2015-10-02: qty 1
  Filled 2015-10-02: qty 2

## 2015-10-02 MED ORDER — ALBUMIN HUMAN 5 % IV SOLN
INTRAVENOUS | Status: DC | PRN
  Administered 2015-10-02 (×2): via INTRAVENOUS

## 2015-10-02 MED ORDER — GABAPENTIN 300 MG PO CAPS
600.0000 mg | ORAL_CAPSULE | Freq: Three times a day (TID) | ORAL | Status: DC
  Administered 2015-10-03 – 2015-10-04 (×5): 600 mg via ORAL
  Filled 2015-10-02 (×6): qty 2

## 2015-10-02 MED ORDER — INSULIN ASPART PROT & ASPART (70-30 MIX) 100 UNIT/ML ~~LOC~~ SUSP: 0.0000 [IU] | Freq: Three times a day (TID) | SUBCUTANEOUS | Status: DC

## 2015-10-02 MED ORDER — AMITRIPTYLINE HCL 50 MG PO TABS
25.0000 mg | ORAL_TABLET | Freq: Every day | ORAL | Status: DC
  Administered 2015-10-03 (×2): 25 mg via ORAL
  Filled 2015-10-02 (×4): qty 1

## 2015-10-02 MED ORDER — SUGAMMADEX SODIUM 200 MG/2ML IV SOLN
INTRAVENOUS | Status: DC | PRN
  Administered 2015-10-02: 200 mg via INTRAVENOUS

## 2015-10-02 MED ORDER — IOPAMIDOL (ISOVUE-300) INJECTION 61%
INTRAVENOUS | Status: DC | PRN
  Administered 2015-10-02: 30 mL via INTRAVENOUS

## 2015-10-02 MED ORDER — ATORVASTATIN CALCIUM 40 MG PO TABS
40.0000 mg | ORAL_TABLET | Freq: Every morning | ORAL | Status: DC
  Administered 2015-10-03 – 2015-10-05 (×3): 40 mg via ORAL
  Filled 2015-10-02 (×3): qty 1

## 2015-10-02 MED ORDER — TRAMADOL HCL 50 MG PO TABS
50.0000 mg | ORAL_TABLET | Freq: Four times a day (QID) | ORAL | Status: DC | PRN
  Administered 2015-10-03: 50 mg via ORAL
  Filled 2015-10-02 (×2): qty 1

## 2015-10-02 MED ORDER — LACTATED RINGERS IV SOLN
INTRAVENOUS | Status: DC
  Administered 2015-10-02 (×4): via INTRAVENOUS

## 2015-10-02 MED ORDER — DEXTROSE 5 % IV SOLN
1.5000 g | INTRAVENOUS | Status: DC
  Filled 2015-10-02: qty 1.5

## 2015-10-02 MED ORDER — OXYCODONE HCL 5 MG PO TABS: 5.0000 mg | ORAL_TABLET | Freq: Once | ORAL | Status: DC | PRN

## 2015-10-02 MED ORDER — HEPARIN (PORCINE) IN NACL 100-0.45 UNIT/ML-% IJ SOLN
1600.0000 [IU]/h | INTRAMUSCULAR | Status: DC
  Administered 2015-10-02 (×2): 1600 [IU]/h via INTRAVENOUS
  Filled 2015-10-02 (×2): qty 250

## 2015-10-02 MED ORDER — 0.9 % SODIUM CHLORIDE (POUR BTL) OPTIME
TOPICAL | Status: DC | PRN
  Administered 2015-10-02: 2000 mL

## 2015-10-02 MED ORDER — SUCCINYLCHOLINE CHLORIDE 20 MG/ML IJ SOLN
INTRAMUSCULAR | Status: DC | PRN
  Administered 2015-10-02: 140 mg via INTRAVENOUS

## 2015-10-02 MED ORDER — ACETAMINOPHEN 325 MG PO TABS
325.0000 mg | ORAL_TABLET | ORAL | Status: DC | PRN
  Administered 2015-10-03: 325 mg via ORAL
  Administered 2015-10-04 – 2015-10-05 (×2): 650 mg via ORAL
  Filled 2015-10-02: qty 2
  Filled 2015-10-02: qty 1
  Filled 2015-10-02: qty 2

## 2015-10-02 MED ORDER — DOCUSATE SODIUM 100 MG PO CAPS
100.0000 mg | ORAL_CAPSULE | Freq: Every day | ORAL | Status: DC
  Administered 2015-10-03 – 2015-10-04 (×2): 100 mg via ORAL
  Filled 2015-10-02 (×2): qty 1

## 2015-10-02 MED ORDER — HYDRALAZINE HCL 20 MG/ML IJ SOLN: 5.0000 mg | INTRAMUSCULAR | Status: DC | PRN

## 2015-10-02 MED ORDER — INSULIN GLARGINE 100 UNIT/ML ~~LOC~~ SOLN
40.0000 [IU] | Freq: Every day | SUBCUTANEOUS | Status: DC
  Administered 2015-10-03 – 2015-10-04 (×3): 40 [IU] via SUBCUTANEOUS
  Filled 2015-10-02 (×4): qty 0.4

## 2015-10-02 MED ORDER — ROCURONIUM BROMIDE 100 MG/10ML IV SOLN
INTRAVENOUS | Status: DC | PRN
  Administered 2015-10-02: 50 mg via INTRAVENOUS

## 2015-10-02 MED ORDER — FENTANYL CITRATE (PF) 100 MCG/2ML IJ SOLN
INTRAMUSCULAR | Status: DC | PRN
  Administered 2015-10-02: 100 ug via INTRAVENOUS
  Administered 2015-10-02 (×3): 50 ug via INTRAVENOUS

## 2015-10-02 MED ORDER — ASPIRIN EC 325 MG PO TBEC
325.0000 mg | DELAYED_RELEASE_TABLET | Freq: Every day | ORAL | Status: DC
  Administered 2015-10-03 – 2015-10-04 (×2): 325 mg via ORAL
  Filled 2015-10-02 (×2): qty 1

## 2015-10-02 MED ORDER — MIDAZOLAM HCL 5 MG/5ML IJ SOLN
INTRAMUSCULAR | Status: DC | PRN
  Administered 2015-10-02: 2 mg via INTRAVENOUS

## 2015-10-02 MED ORDER — ALPRAZOLAM 0.5 MG PO TABS
1.0000 mg | ORAL_TABLET | Freq: Three times a day (TID) | ORAL | Status: DC
  Administered 2015-10-03 – 2015-10-04 (×3): 1 mg via ORAL
  Filled 2015-10-02: qty 2
  Filled 2015-10-02: qty 4
  Filled 2015-10-02 (×4): qty 2

## 2015-10-02 MED ORDER — ADULT MULTIVITAMIN W/MINERALS CH
1.0000 | ORAL_TABLET | Freq: Every day | ORAL | Status: DC
  Administered 2015-10-03 – 2015-10-05 (×3): 1 via ORAL
  Filled 2015-10-02 (×3): qty 1

## 2015-10-02 MED ORDER — BUPROPION HCL 100 MG PO TABS
100.0000 mg | ORAL_TABLET | Freq: Two times a day (BID) | ORAL | Status: DC
  Administered 2015-10-03 – 2015-10-05 (×4): 100 mg via ORAL
  Filled 2015-10-02 (×5): qty 1

## 2015-10-02 MED ORDER — AMPHETAMINE-DEXTROAMPHET ER 5 MG PO CP24
30.0000 mg | ORAL_CAPSULE | Freq: Every day | ORAL | Status: DC
  Administered 2015-10-04 – 2015-10-05 (×2): 30 mg via ORAL
  Filled 2015-10-02: qty 3
  Filled 2015-10-02 (×2): qty 6

## 2015-10-02 MED ORDER — SODIUM CHLORIDE 0.9 % IV SOLN
INTRAVENOUS | Status: DC
  Administered 2015-10-02: 22:00:00 via INTRAVENOUS

## 2015-10-02 MED ORDER — LABETALOL HCL 5 MG/ML IV SOLN
10.0000 mg | INTRAVENOUS | Status: DC | PRN
  Administered 2015-10-03 (×3): 10 mg via INTRAVENOUS
  Filled 2015-10-02 (×3): qty 4

## 2015-10-02 MED ORDER — SODIUM CHLORIDE 0.9 % IV SOLN: 500.0000 mL | Freq: Once | INTRAVENOUS | Status: DC | PRN

## 2015-10-02 MED ORDER — FENTANYL CITRATE (PF) 250 MCG/5ML IJ SOLN
INTRAMUSCULAR | Status: AC
  Filled 2015-10-02: qty 5

## 2015-10-02 SURGICAL SUPPLY — 50 items
CANISTER SUCTION 2500CC (MISCELLANEOUS) ×3 IMPLANT
CANNULA VESSEL 3MM 2 BLNT TIP (CANNULA) ×6 IMPLANT
CATH EMB 3FR 80CM (CATHETERS) ×3 IMPLANT
CATH EMB 4FR 80CM (CATHETERS) ×3 IMPLANT
CATH EMB 5FR 80CM (CATHETERS) ×6 IMPLANT
CLIP TI MEDIUM 24 (CLIP) ×3 IMPLANT
CLIP TI WIDE RED SMALL 24 (CLIP) ×3 IMPLANT
DRAIN SNY 10X20 3/4 PERF (WOUND CARE) IMPLANT
DRAPE INCISE IOBAN 66X45 STRL (DRAPES) ×3 IMPLANT
DRSG COVADERM 4X10 (GAUZE/BANDAGES/DRESSINGS) IMPLANT
DRSG COVADERM 4X8 (GAUZE/BANDAGES/DRESSINGS) ×3 IMPLANT
ELECT REM PT RETURN 9FT ADLT (ELECTROSURGICAL) ×3
ELECTRODE REM PT RTRN 9FT ADLT (ELECTROSURGICAL) ×2 IMPLANT
EVACUATOR SILICONE 100CC (DRAIN) IMPLANT
GAUZE SPONGE 4X4 16PLY XRAY LF (GAUZE/BANDAGES/DRESSINGS) ×3 IMPLANT
GLOVE BIO SURGEON STRL SZ 6.5 (GLOVE) ×3 IMPLANT
GLOVE BIO SURGEON STRL SZ7.5 (GLOVE) ×6 IMPLANT
GLOVE BIOGEL PI IND STRL 6.5 (GLOVE) ×6 IMPLANT
GLOVE BIOGEL PI IND STRL 7.5 (GLOVE) ×2 IMPLANT
GLOVE BIOGEL PI IND STRL 8 (GLOVE) ×2 IMPLANT
GLOVE BIOGEL PI INDICATOR 6.5 (GLOVE) ×3
GLOVE BIOGEL PI INDICATOR 7.5 (GLOVE) ×1
GLOVE BIOGEL PI INDICATOR 8 (GLOVE) ×1
GLOVE ECLIPSE 6.5 STRL STRAW (GLOVE) ×6 IMPLANT
GLOVE ECLIPSE 7.0 STRL STRAW (GLOVE) ×6 IMPLANT
GOWN STRL REUS W/ TWL LRG LVL3 (GOWN DISPOSABLE) ×10 IMPLANT
GOWN STRL REUS W/TWL LRG LVL3 (GOWN DISPOSABLE) ×5
KIT BASIN OR (CUSTOM PROCEDURE TRAY) ×3 IMPLANT
KIT ROOM TURNOVER OR (KITS) ×3 IMPLANT
LIQUID BAND (GAUZE/BANDAGES/DRESSINGS) ×6 IMPLANT
NS IRRIG 1000ML POUR BTL (IV SOLUTION) ×6 IMPLANT
PACK PERIPHERAL VASCULAR (CUSTOM PROCEDURE TRAY) ×3 IMPLANT
PAD ARMBOARD 7.5X6 YLW CONV (MISCELLANEOUS) ×6 IMPLANT
PATCH VASC XENOSURE 1CMX6CM (Vascular Products) ×1 IMPLANT
PATCH VASC XENOSURE 1X6 (Vascular Products) ×2 IMPLANT
SPONGE LAP 18X18 X RAY DECT (DISPOSABLE) ×3 IMPLANT
SPONGE SURGIFOAM ABS GEL 100 (HEMOSTASIS) IMPLANT
STAPLER VISISTAT 35W (STAPLE) ×3 IMPLANT
STOPCOCK 4 WAY LG BORE MALE ST (IV SETS) ×3 IMPLANT
SUT PROLENE 5 0 C 1 24 (SUTURE) ×12 IMPLANT
SUT PROLENE 6 0 CC (SUTURE) ×33 IMPLANT
SUT SILK 2 0 FS (SUTURE) ×3 IMPLANT
SUT VIC AB 2-0 SH 27 (SUTURE) ×1
SUT VIC AB 2-0 SH 27XBRD (SUTURE) ×2 IMPLANT
SUT VIC AB 3-0 SH 27 (SUTURE) ×3
SUT VIC AB 3-0 SH 27X BRD (SUTURE) ×6 IMPLANT
SUT VICRYL 4-0 PS2 18IN ABS (SUTURE) ×6 IMPLANT
SYRINGE 3CC LL L/F (MISCELLANEOUS) ×6 IMPLANT
TRAY FOLEY W/METER SILVER 16FR (SET/KITS/TRAYS/PACK) ×3 IMPLANT
WATER STERILE IRR 1000ML POUR (IV SOLUTION) ×3 IMPLANT

## 2015-10-02 NOTE — ED Notes (Signed)
OR calls with bay assignment and number for report. OR made aware that heparin drip is about to be started, They request that I speak to short stay before heparin is started.

## 2015-10-02 NOTE — Progress Notes (Signed)
RLE cold, no DP or PT Doppler pulses-per CRNA, Dr Darrick PennaFields knows & did not expect any. Will cont to  monitor.

## 2015-10-02 NOTE — ED Notes (Addendum)
PT reports he has a graft in right side. PT could feel it pulsating yesterday morning. PT reports he could not feel it pulsating as of 10am yesterday.  PT has been off his Xarelto since Friday for a dental procedure that was scheduled for this AM. PT took a xarelto yesterday morning because he was concerned about his graft. PT did not have xarelto this AM.  PT reports his right foot is cold, but does not hurt. Doppler cannot pick up pulse in right foot.

## 2015-10-02 NOTE — Anesthesia Preprocedure Evaluation (Signed)
Anesthesia Evaluation  Patient identified by MRN, date of birth, ID band Patient awake    Reviewed: Allergy & Precautions, NPO status , Patient's Chart, lab work & pertinent test results  Airway Mallampati: II   Neck ROM: full    Dental   Pulmonary Current Smoker,    breath sounds clear to auscultation       Cardiovascular hypertension, + Peripheral Vascular Disease   Rhythm:regular Rate:Normal     Neuro/Psych Anxiety Depression Bipolar Disorder CVA    GI/Hepatic GERD  ,  Endo/Other  diabetes, Type 2  Renal/GU      Musculoskeletal   Abdominal   Peds  Hematology   Anesthesia Other Findings   Reproductive/Obstetrics                             Anesthesia Physical Anesthesia Plan  ASA: III and emergent  Anesthesia Plan: General   Post-op Pain Management:    Induction: Intravenous  Airway Management Planned: Oral ETT  Additional Equipment:   Intra-op Plan:   Post-operative Plan: Extubation in OR  Informed Consent: I have reviewed the patients History and Physical, chart, labs and discussed the procedure including the risks, benefits and alternatives for the proposed anesthesia with the patient or authorized representative who has indicated his/her understanding and acceptance.     Plan Discussed with: CRNA, Anesthesiologist and Surgeon  Anesthesia Plan Comments:         Anesthesia Quick Evaluation

## 2015-10-02 NOTE — Op Note (Signed)
Procedure: Thrombectomy of right axillary femoral bypass, thrombectomy of right popliteal and tibial arteries  Preoperative diagnosis: Acute ischemia right leg  Postoperative diagnosis: Same  Anesthesia: Gen.  Assistant: Cari Caraway M.D., [facility] PA-C  Operative findings: Acute thrombus axillary femoral bypass with no obvious anastomotic narrowing  Operative details: After obtaining informed consent, the patient was taken to the operating room. The patient was placed in supine position on the operating table. After induction of general anesthesia and endotracheal ablation, a Foley catheter was placed. Next the patient's entire right side of his body from the chin down to the right toes was prepped and draped in usual sterile fashion. A longitudinal incision was made her pre-existing scar in the right groin. This was carried down through the subcutaneous tissues down to level of an existing axillary femoral graft. Scar tissue was quite dense surrounding all of the grafts and the patient's native arteries. There is no pulse within the axillary femoral graft or the femoral artery. The axillary graft was dissected free circumferentially and a vessel loop placed around this. There was also a femoral-femoral graft coming off of this and this was dissected free circumferentially and a vessel loop placed around this. There was no pulse within the femoral-femoral graft either. Dissection was then carried down to the hood of the graft to the common femoral artery. There was dense scar tissue surrounding the native proximal common femoral artery so this was not dissected free circumferentially it was dissected free on its anterior two thirds surface. The profunda femoris and superficial femoral arteries were dissected free circumferentially and vessel loops placed around these. Next the patient was given 10,000 units of intravenous heparin. Longitudinal opening was made in the graft. There was fairly  vigorous bleeding antegrade from the patient's native common femoral artery and this was controlled with a #5 Fogarty catheter. There is a large amount of fresh thrombus in the common femoral artery and this was all taken out under direct vision. #4 Fogarty catheter was then passed down the superficial femoral artery and I was able to pass this for the full length of the catheter and a large amount of thrombus was retrieved. The catheter was passed several times until no further thrombus was retrieved on 2 clean passes. Catheter was also passed down the profunda several times no real thrombus was retrieved from this but there was some backbleeding from the profunda. Both of these were thoroughly flushed with heparinized saline and reoccluded. I then proceeded to thrombectomize the axillary femoral bypass. A large amount of thrombus was removed and excellent arterial inflow was achieved. 2 clean passes were also obtained. At this point the graftotomy was reapproximated using a running 5-0 Prolene suture. At completion anastomosis I inspected the patient's right foot and there was no Doppler flow. A completion arteriogram was performed by inserting a 21-gauge butterfly needle into the graft and with inflow occlusion contrast was injected. There was no visualization of any blood vessels below the knee. At this point the butterfly needle is removed and hemostasis obtained with a 6-0 Prolene stitch. A longitudinal opening was then made on the medial aspect of the right leg below the knee through a pre-existing scar. The incision was carried out through the saphenous tissues down to level of the popliteal artery. Again there was dense scar tissue in this area from previous operations. Dissection of the anterior tibial and tibioperoneal trunk was performed although this was fairly tedious due to surrounding venous structures in scar tissue.  Vessel loops were placed around both knees. The popliteal artery was then dissected  free circumferentially several centimeters above this. Vessel loops were also placed around this. Patient was given an additional bolus of heparin. A longitudinal opening was made in the popliteal artery. There was a weblike intimal hyperplastic narrowing in the popliteal artery. This was all taken down with sharp dissection. This extended into the origins of the tibial vessels and this was also removed using endarterectomy freer. I was then able to pass a #3 Fogarty catheter down the tibioperoneal trunk and some specks of thrombus were retrieved. There was fairly good backbleeding from the tibia. All trunk at this point. I was unable to pass the Fogarty catheter down the anterior tibial artery due to severe narrowing. This would not even accept a 1-1/2 dilator. There was some backbleeding from it. These were thoroughly flushed with heparinized saline and reoccluded with vessel loops. Fogarty catheter was then passed up the popliteal artery and several passes were made retrieving some intimal hyperplastic-looking material and an excellent arterial inflow was achieved. Popliteal artery was controlled proximally with a fine bulldog clamp and a bovine pericardial patch was brought up in the operative field and sewn on as a patch angioplasty on the popliteal artery using a running 6-0 Prolene suture. Just prior completion of the anastomosis it was forebled backbled and thoroughly flushed. Anastomosis was secured Vesseloops released there was pulsatile flow in the below-knee popliteal artery medially. There is biphasic Doppler flow in the origins of the tibial peroneal trunk and anterior tibial arteries. Since still had no Doppler flow at the level of the ankle. I did not feel that any further interventions were groin to improve his flow at this point. Hemostasis was obtained in the groin and below-knee popliteal incision. The below-knee popliteal incision was closed with multiple layers of running 3-0 Vicryl suture.  Skin was closed with staples. The groin incision was then closed with multiple layers of 203 0 Vicryl suture and a 4-0 Vicryl subcuticular stitch and skin. The patient tolerated procedure well and there were complications. The ansa and sponge and needle count was correct at the end of the case. The patient was taken to the recovery room in stable condition. Patient had no Doppler flow at the ankle level and the prognosis for the limb overall is poor. Hopefully flow to his foot will improve with warming but otherwise he will need most likely an above-knee amputation in the next few days.  Fabienne Brunsharles Teletha Petrea, MD [facility] Office: [phone] Pager: [phone]

## 2015-10-02 NOTE — ED Notes (Signed)
Specialist at bedside.

## 2015-10-02 NOTE — Progress Notes (Signed)
ANTICOAGULATION CONSULT NOTE - Initial Consult  Pharmacy Consult for Heparin Indication: Claudication  No Known Allergies  Patient Measurements: Height: 6\' 2"  (188 cm) Weight: 220 lb (99.791 kg) IBW/kg (Calculated) : 82.2 Heparin Dosing Weight:  99.8 kg  Vital Signs: Temp: 98.3 F (36.8 C) (04/04 1056) Temp Source: Oral (04/04 1056) BP: 206/111 mmHg (04/04 1056) Pulse Rate: 81 (04/04 1056)  Labs: No results for input(s): HGB, HCT, PLT, APTT, LABPROT, INR, HEPARINUNFRC, HEPRLOWMOCWT, CREATININE, CKTOTAL, CKMB, TROPONINI in the last 72 hours.  CrCl cannot be calculated (Patient has no serum creatinine result on file.).   Medical History: Past Medical History  Diagnosis Date  . GERD (gastroesophageal reflux disease)   . Nicotine addiction   . ETOH abuse   . Anxiety   . Hypertension     no pcp     saw peter nisham once  . Peripheral vascular disease (HCC)   . Hyperlipidemia   . Collagen vascular disease (HCC)   . Type II diabetes mellitus (HCC)   . History of blood transfusion 08/2013    "maybe when he had his amputation"  . Chronic lower back pain   . Depression   . Bipolar disorder (HCC)   . PAD (peripheral artery disease) (HCC)   . Stroke (HCC) 08/11/11    "slurred speech; no feeling elbow down on the right; lost hearing left ear" (08/16/2014)Right arm numb,     Medications:  See med rec  Assessment: 50 y/o M presents with cold R foot. Has known PVD with a graft on the R side (6 vasculr surgery procedures) .He had been off of his blood thinner (xarelto) since Friday 3/31 because he was going to have 12 teeth pulled but was unable to get that done and restarted taking his blood thinner yesterday. MD Unable to palpate a pulse over the graft and no Doppler pulses in the right foot. Last dose of Xarelto 4/3 AM.  Goal of Therapy:  APTT 66-102 Heparin level 0.3-0.7 units/ml Monitor platelets by anticoagulation protocol: Yes   Plan:  Heparin 4000 unit bolus Heparin  1600 units/hr F/u baseline labs as INR expected to be elevated due to interaction with Xarelto APTT and HL 6-8 hrs after heparin starts    Kaitlin Alcindor S. Merilynn Finlandobertson, PharmD, BCPS Clinical Staff Pharmacist Pager [phone]  Misty Stanleyobertson, Lachrista Heslin Stillinger 10/02/2015,11:12 AM

## 2015-10-02 NOTE — Anesthesia Procedure Notes (Signed)
Procedure Name: Intubation Date/Time: 10/02/2015 1:17 PM Performed by: Dairl PonderJIANG, Keegen Heffern Pre-anesthesia Checklist: Patient identified, Timeout performed, Emergency Drugs available, Suction available and Patient being monitored Patient Re-evaluated:Patient Re-evaluated prior to inductionOxygen Delivery Method: Circle system utilized Preoxygenation: Pre-oxygenation with 100% oxygen Intubation Type: IV induction Ventilation: Mask ventilation without difficulty and Oral airway inserted - appropriate to patient size Grade View: Grade II Tube type: Oral Tube size: 7.5 mm Number of attempts: 1 Airway Equipment and Method: Stylet Placement Confirmation: ETT inserted through vocal cords under direct vision,  breath sounds checked- equal and bilateral and positive ETCO2 Secured at: 24 cm Tube secured with: Tape Dental Injury: Teeth and Oropharynx as per pre-operative assessment

## 2015-10-02 NOTE — ED Notes (Addendum)
Cathy in short stay accepts report at this time. She requests that heparin not be started and that it is sent to OR with PT.

## 2015-10-02 NOTE — Progress Notes (Signed)
Dr Darrick PennaFields here-aware no DP/PT pulses RLE and it is cold.

## 2015-10-02 NOTE — ED Notes (Signed)
Pharmacy delivers heparin at this time.
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
## 2015-10-02 NOTE — Transfer of Care (Signed)
Immediate Anesthesia Transfer of Care Note  Patient: [name] LauthRobert Matsuoka  Procedure(s) Performed: Procedure(s): THROMBECTOMY OF RIGHT AXillary - Femoral bypass, Right popliteal tibial thrombectomy (Right) INTRA OPERATIVE ARTERIOGRAM (Right) PATCH ANGIOPLASTY Right leg (Right)  Patient Location: PACU  Anesthesia Type:General  Level of Consciousness: sedated  Airway & Oxygen Therapy: Patient Spontanous Breathing  Post-op Assessment: Report given to RN and Post -op Vital signs reviewed and stable  Post vital signs: Reviewed and stable  Last Vitals:  Filed Vitals:   10/02/15 1216 10/02/15 1815  BP:    Pulse:    Temp: 37.1 C 36.5 C  Resp:      Complications: No apparent anesthesia complications

## 2015-10-02 NOTE — ED Notes (Signed)
PT leaves department at this time.
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
## 2015-10-02 NOTE — ED Notes (Signed)
MD at bedside. 

## 2015-10-03 ENCOUNTER — Encounter (HOSPITAL_COMMUNITY): Payer: Self-pay | Admitting: Vascular Surgery

## 2015-10-03 LAB — GLUCOSE, CAPILLARY
Glucose-Capillary: 188 mg/dL — ABNORMAL HIGH (ref 65–99)
Glucose-Capillary: 210 mg/dL — ABNORMAL HIGH (ref 65–99)
Glucose-Capillary: 176 mg/dL — ABNORMAL HIGH (ref 65–99)

## 2015-10-03 MED ORDER — RIVAROXABAN 20 MG PO TABS
20.0000 mg | ORAL_TABLET | Freq: Every day | ORAL | Status: DC
  Filled 2015-10-03: qty 1

## 2015-10-03 MED ORDER — WHITE PETROLATUM GEL
Status: AC
  Filled 2015-10-03: qty 1

## 2015-10-03 MED ORDER — QUETIAPINE FUMARATE ER 400 MG PO TB24
800.0000 mg | ORAL_TABLET | Freq: Every day | ORAL | Status: DC
  Administered 2015-10-03: 800 mg via ORAL
  Filled 2015-10-03 (×5): qty 2

## 2015-10-03 MED ORDER — SILDENAFIL CITRATE 20 MG PO TABS
20.0000 mg | ORAL_TABLET | Freq: Two times a day (BID) | ORAL | Status: DC
  Administered 2015-10-04: 20 mg via ORAL
  Filled 2015-10-03 (×5): qty 1

## 2015-10-03 MED ORDER — RIVAROXABAN 20 MG PO TABS
20.0000 mg | ORAL_TABLET | Freq: Every day | ORAL | Status: DC
  Administered 2015-10-03 – 2015-10-05 (×3): 20 mg via ORAL
  Filled 2015-10-03 (×3): qty 1

## 2015-10-03 MED ORDER — INSULIN ASPART PROT & ASPART (70-30 MIX) 100 UNIT/ML ~~LOC~~ SUSP
4.0000 [IU] | Freq: Three times a day (TID) | SUBCUTANEOUS | Status: DC
  Administered 2015-10-03: 4 [IU] via SUBCUTANEOUS
  Administered 2015-10-03: 6 [IU] via SUBCUTANEOUS
  Administered 2015-10-04 (×2): 4 [IU] via SUBCUTANEOUS
  Administered 2015-10-04: 6 [IU] via SUBCUTANEOUS
  Administered 2015-10-05: 4 [IU] via SUBCUTANEOUS
  Filled 2015-10-03 (×2): qty 10

## 2015-10-03 MED ORDER — GLIPIZIDE 10 MG PO TABS
10.0000 mg | ORAL_TABLET | Freq: Every day | ORAL | Status: DC
  Administered 2015-10-03 – 2015-10-05 (×3): 10 mg via ORAL
  Filled 2015-10-03: qty 1
  Filled 2015-10-03 (×2): qty 2
  Filled 2015-10-03: qty 1

## 2015-10-03 NOTE — Progress Notes (Addendum)
[facility] [place]  Subjective  - Resting, no bleeding over night.   Objective 152/93 90 98.7 F (37.1 C) (Oral) 16 98%  Intake/Output Summary (Last 24 hours) at 10/03/15 0758 Last data filed at 10/03/15 0547  Gross per 24 hour  Intake 3172.08 ml  Output   2745 ml  Net 427.08 ml    Doppler PT right LE  Incisions Clean and dry Heart RRR Lungs non labored breathing  Assessment/Planning: POD #1  Procedure(s) Performed: Procedure(s) (LRB): THROMBECTOMY OF RIGHT AXillary - Femoral bypass, Right popliteal tibial thrombectomy (Right) INTRA OPERATIVE ARTERIOGRAM (Right) PATCH ANGIOPLASTY Right leg (Right) Saline lock IV Start heparin per pharmacy  Start Xarelto Encourage mobility   [place], [place]MA [name] [name] 10/03/2015 7:58 AM -- Reasonable flow to foot  Restart anticoagulation Transfer to 2w  Fabienne Brunsharles Jamicah Anstead, MD [facility] [place] Office: [phone] Pager: [phone]   Laboratory Lab Results:  Recent Labs  10/02/15 1059 10/02/15 1654  WBC 10.9*  --   HGB 11.8* 9.9*  HCT 36.0* 29.0*  PLT 195  --    BMET  Recent Labs  10/02/15 1059 10/02/15 1654  NA 139 140  K 3.3* 3.6  CL 102  --   CO2 24  --   GLUCOSE 129*  --   BUN 11  --   CREATININE 0.91  --   CALCIUM 9.9  --     COAG Lab Results  Component Value Date   INR 1.23 10/02/2015   INR 1.10 12/27/2014   INR 1.08 11/22/2014   No results found for: PTT

## 2015-10-03 NOTE — Discharge Instructions (Signed)
Information on my medicine - XARELTO (rivaroxaban)  This medication education was reviewed with me or my healthcare representative as part of my discharge preparation.  The pharmacist that spoke with me during my hospital stay was:  Sampson SiMancheril, Mackensey Bolte G, [facility]PH  WHY WAS XARELTO PRESCRIBED FOR YOU? Xarelto was prescribed to treat blood clots that may have been found in the veins of your legs (deep vein thrombosis) or in your lungs (pulmonary embolism) and to reduce the risk of them occurring again.  What do you need to know about Xarelto? The dose is changed to one 20 mg tablet taken ONCE A DAY with your evening meal.  DO NOT stop taking Xarelto without talking to the health care provider who prescribed the medication.  Refill your prescription for 20 mg tablets before you run out.  After discharge, you should have regular check-up appointments with your healthcare provider that is prescribing your Xarelto.  In the future your dose may need to be changed if your kidney function changes by a significant amount.  What do you do if you miss a dose? If you are taking Xarelto TWICE DAILY and you miss a dose, take it as soon as you remember. You may take two 15 mg tablets (total 30 mg) at the same time then resume your regularly scheduled 15 mg twice daily the next day.  If you are taking Xarelto ONCE DAILY and you miss a dose, take it as soon as you remember on the same day then continue your regularly scheduled once daily regimen the next day. Do not take two doses of Xarelto at the same time.   Important Safety Information Xarelto is a blood thinner medicine that can cause bleeding. You should call your healthcare provider right away if you experience any of the following: ? Bleeding from an injury or your nose that does not stop. ? Unusual colored urine (red or dark brown) or unusual colored stools (red or black). ? Unusual bruising for unknown reasons. ? A serious fall or if you hit  your head (even if there is no bleeding).  Some medicines may interact with Xarelto and might increase your risk of bleeding while on Xarelto. To help avoid this, consult your healthcare provider or pharmacist prior to using any new prescription or non-prescription medications, including herbals, vitamins, non-steroidal anti-inflammatory drugs (NSAIDs) and supplements.  This website has more information on Xarelto: [email or web address].
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
## 2015-10-03 NOTE — Progress Notes (Signed)
Rehab Admissions Coordinator Note:  Patient was screened by Clois DupesBoyette, Samatha Anspach Godwin for appropriateness for an Inpatient Acute Rehab Consult per PT recommendation.   At this time, we are recommending Inpatient Rehab consult.  Clois DupesBoyette, Weston Kallman Godwin 10/03/2015, 1:50 PM  I can be reached at [phone].

## 2015-10-03 NOTE — Progress Notes (Signed)
Upon assessment with doppler R PT pulse absent, popliteal pulse dopplerable. MD Fields notified. No new orders given. Will continue to monitor.

## 2015-10-03 NOTE — Anesthesia Postprocedure Evaluation (Signed)
Anesthesia Post Note  Patient: [name] LauthRobert [name]  Procedure(s) Performed: Procedure(s) (LRB): THROMBECTOMY OF RIGHT AXillary - Femoral bypass, Right popliteal tibial thrombectomy (Right) INTRA OPERATIVE ARTERIOGRAM (Right) PATCH ANGIOPLASTY Right leg (Right)  Patient location during evaluation: PACU Anesthesia Type: General Level of consciousness: awake and alert Pain management: pain level controlled Vital Signs Assessment: post-procedure vital signs reviewed and stable Respiratory status: spontaneous breathing, nonlabored ventilation, respiratory function stable and patient connected to nasal cannula oxygen Cardiovascular status: blood pressure returned to baseline and stable Postop Assessment: no signs of nausea or vomiting Anesthetic complications: no    Last Vitals:  Filed Vitals:   10/03/15 0309 10/03/15 0608  BP: 155/100 152/93  Pulse: 88 90  Temp: 37.1 C   Resp: 19 16    Last Pain:  Filed Vitals:   10/03/15 0609  PainSc: Asleep                 Cecile HearingStephen Edward Turk

## 2015-10-04 DIAGNOSIS — R5381 Other malaise: Secondary | ICD-10-CM

## 2015-10-04 DIAGNOSIS — Z89612 Acquired absence of left leg above knee: Secondary | ICD-10-CM

## 2015-10-04 DIAGNOSIS — I739 Peripheral vascular disease, unspecified: Secondary | ICD-10-CM

## 2015-10-04 LAB — GLUCOSE, CAPILLARY
Glucose-Capillary: 161 mg/dL — ABNORMAL HIGH (ref 65–99)
Glucose-Capillary: 193 mg/dL — ABNORMAL HIGH (ref 65–99)
Glucose-Capillary: 234 mg/dL — ABNORMAL HIGH (ref 65–99)
Glucose-Capillary: 265 mg/dL — ABNORMAL HIGH (ref 65–99)
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
## 2015-10-04 NOTE — Progress Notes (Addendum)
  Progress Note    10/04/2015 7:53 AM 2 Days Post-Op  Subjective:  sleeping  Tm 100.1 now afebrile HR  90's-100's  100's-120's systolic 95% 2LO2NC   Filed Vitals:   10/03/15 2230 10/04/15 0513  BP: 120/72 98/52  Pulse: 95 89  Temp: 98.4 F (36.9 C) 97.8 F (36.6 C)  Resp: 18 18    Physical Exam: Lungs:  Non labored Incisions:  Mild bloody drainage on right groin bandage that was placed last night-no further bleeding;  Both incisions are clean and dry Extremities:  Absent doppler signals right foot; brisk right popliteal doppler signal   CBC    Component Value Date/Time   WBC 10.9* 10/02/2015 1059   RBC 4.36 10/02/2015 1059   HGB 9.9* 10/02/2015 1654   HCT 29.0* 10/02/2015 1654   PLT 195 10/02/2015 1059   MCV 82.6 10/02/2015 1059   MCH 27.1 10/02/2015 1059   MCHC 32.8 10/02/2015 1059   RDW 14.0 10/02/2015 1059   LYMPHSABS 2.3 10/02/2015 1059   MONOABS 0.6 10/02/2015 1059   EOSABS 0.1 10/02/2015 1059   BASOSABS 0.0 10/02/2015 1059    BMET    Component Value Date/Time   NA 140 10/02/2015 1654   K 3.6 10/02/2015 1654   CL 102 10/02/2015 1059   CO2 24 10/02/2015 1059   GLUCOSE 129* 10/02/2015 1059   BUN 11 10/02/2015 1059   CREATININE 0.91 10/02/2015 1059   CALCIUM 9.9 10/02/2015 1059   GFRNONAA >60 10/02/2015 1059   GFRAA >60 10/02/2015 1059    INR    Component Value Date/Time   INR 1.23 10/02/2015 1059     Intake/Output Summary (Last 24 hours) at 10/04/15 0753 Last data filed at 10/04/15 0516  Gross per 24 hour  Intake    170 ml  Output   1200 ml  Net  -1030 ml     Assessment:  50 y.o. male is s/p:  Thrombectomy of right axillary femoral bypass, thrombectomy of right popliteal and tibial arteries  2 Days Post-Op  Plan: -pt with cool foot and without any doppler signals in right foot; brisk right popliteal doppler signal -mild bloody ooze from distal incision right groin-no further bleeding -DVT prophylaxis:  Xarelto restarted  -right  foot is viable at this time-may need amputation in the future. -may be candidate for CIR-will order consult.   Doreatha MassedSamantha Rhyne, PA-C [facility] [phone] 10/04/2015 7:53 AM  Agree with above.  No doppler signals right foot.  No plans for further revascularization.  Right ax fem patent. Plan for right AKA if develops rest pain right foot or signs of infection Always sleepy need to back off pain meds  Fabienne Brunsharles Jearldean Gutt, MD [facility] [place] Office: [phone] Pager: [phone]
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
## 2015-10-05 ENCOUNTER — Other Ambulatory Visit: Payer: Self-pay | Admitting: *Deleted

## 2015-10-05 LAB — GLUCOSE, CAPILLARY: Glucose-Capillary: 175 mg/dL — ABNORMAL HIGH (ref 65–99)

## 2015-10-05 MED ORDER — ASPIRIN 81 MG PO TBEC: 81.0000 mg | DELAYED_RELEASE_TABLET | Freq: Every day | ORAL | Status: AC

## 2015-10-05 MED ORDER — ASPIRIN EC 81 MG PO TBEC: 81.0000 mg | DELAYED_RELEASE_TABLET | Freq: Every day | ORAL | Status: DC

## 2015-10-05 MED ORDER — OXYCODONE HCL 10 MG PO TABS: 10.0000 mg | ORAL_TABLET | Freq: Four times a day (QID) | ORAL | Status: DC | PRN
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
## 2015-10-05 NOTE — Progress Notes (Signed)
Pt wife upset about pt medication making him too drowsy. Wednesday and Thursday night night nurse reviewed each 2200 medication with wife and pt. Nurse administered and held medication as pt and wife requested. Early Friday morning pt was transported by wife to wheelchair. Wife unable to transfer pt back to bed due to him being drowsy. Pt drowsy but easily arousable with VSS. Pt wife upset that medication administered made pt increasingly drowsy. Charge nurse discussed with pt that 300 mg of Seroquel was administered which was less than the 800 mg which is his usual dosage given on Wednesday night. Charge nurse discussed medication side effects with wife.  Sandrea HammondJunris Vishwa Dais RN

## 2015-10-05 NOTE — Progress Notes (Addendum)
Took over care for the pt. at 2300. During report, previous nurse was discussing what medication to be administered and held per pt. and wife request. Pt. Requested to take his Seroquel as well as pain medicine together. Wife was agreeable and requested for it to be given at 1230 am. Pt given 300mg  of Seroquel at 12:54 am, which was less than the 800mg  he was given the previous night. Per pt wife, pt normally takes 800mg  at home. Pt. Was also given 10 mg of oxy and 650 mg of tylenol, which been his normal dose during this hospitalization. Pt wife was upset afterward d/t pt being too drowsy to transfer back to bed after using the bathroom.

## 2015-10-05 NOTE — Progress Notes (Signed)
Order received to discharge.  Telemetry removed and CCMD notified.  IV removed with catheter intact.  Discharge education given to Pt with wife at bedside.  All questions answered.   Pt denies pain at this time.  Pt stable to discharge.

## 2015-10-05 NOTE — Discharge Summary (Signed)
Discharge Summary     [name] [name] [birth year] 50 y.o. male  [id number]  Admission Date: 10/02/2015  Discharge Date: 10/05/15  Physician: Sherren Kernsharles E Fields, MD  Admission Diagnosis: Arterial occlusion [facility](HCC) [I74.9] Lower limb ischemia [I99.8]   HPI:   This is a 50 y.o. male known to our practice. He comes in today with ischemic right LE. He has had multiple procedures over the last 5 years. 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 He reports that yesterday he was developing numbness and coldness to this right LE. His girlfriend finally convinced him to come to the ED. Past medical history includes:DM type 2, bipolar disorder, ETOH abuse, tobacco abuse. He has stopped drinking recently, but continues to smoke. He has hyperlipidemia managed with Lipitor, HTN managed with lisinopril, clonidine, and HCTZ. He has been on Xarelto since 11/24/2014.   He recently stopped this for a dental procedure about 3 days ago. His girlfriend gave him one dose yesterday when she noticed his cold leg.  Hospital Course:  The patient was admitted to the hospital and taken to the operating room on 10/02/2015 and underwent: Thrombectomy of right axillary femoral bypass, thrombectomy of right popliteal and tibial arteries.  Operative findings included acute thrombus axillary femoral bypass with no obvious anastomotic narrowing.  The pt tolerated the procedure well and was transported to the PACU in good condition.   By POD 1, he had reasonable flow to the foot.  His Xarelto was restarted and he was transferred to the telemetry floor.    By POD 2, he did not have any doppler signals in the right foot.  There are no further plans for revascularization.  His right axillary to femoral artery bypass graft was patent.  The pt was tolerating at this time.  If he develops rest pain or develops signs of infection, he will  need a right AKA.  He was sleepy and pain medications need to be adjusted.  On POD 3, he was discharged home.  His aspirin was changed to 81mg  daily.  His right foot was ischemic, but tolerating.   The remainder of the hospital course consisted of increasing mobilization and increasing intake of solids without difficulty.  CBC    Component Value Date/Time   WBC 10.9* 10/02/2015 1059   RBC 4.36 10/02/2015 1059   HGB 9.9* 10/02/2015 1654   HCT 29.0* 10/02/2015 1654   PLT 195 10/02/2015 1059   MCV 82.6 10/02/2015 1059   MCH 27.1 10/02/2015 1059   MCHC 32.8 10/02/2015 1059   RDW 14.0 10/02/2015 1059   LYMPHSABS 2.3 10/02/2015 1059   MONOABS 0.6 10/02/2015 1059   EOSABS 0.1 10/02/2015 1059   BASOSABS 0.0 10/02/2015 1059    BMET    Component Value Date/Time   NA 140 10/02/2015 1654   K 3.6 10/02/2015 1654   CL 102 10/02/2015 1059   CO2 24 10/02/2015 1059   GLUCOSE 129* 10/02/2015 1059   BUN 11 10/02/2015 1059   CREATININE 0.91 10/02/2015 1059   CALCIUM 9.9 10/02/2015 1059   GFRNONAA >60 10/02/2015 1059   GFRAA >60 10/02/2015 1059     Discharge Instructions    Call MD for:  redness, tenderness, or signs of infection (pain, swelling, bleeding, redness, odor or green/yellow discharge around incision site)    Complete by:  As directed      Call MD for:  severe or increased pain, loss or  decreased feeling  in affected limb(s)    Complete by:  As directed      Call MD for:  temperature >100.5    Complete by:  As directed      Discharge wound care:    Complete by:  As directed   Wash the groin wound with soap and water daily and pat dry.  May also dry groin with hairdryer after shower to get completely dry.  (No tub bath-only shower)  Then put a dry gauze or washcloth there to keep this area dry daily and as needed.  Do not use Vaseline or neosporin on your incisions.  Only use soap and water on your incisions and then protect and keep dry.     Driving Restrictions    Complete  by:  As directed   No driving for 2 weeks     Lifting restrictions    Complete by:  As directed   No lifting for 4 weeks     Resume previous diet    Complete by:  As directed            Discharge Diagnosis:  Arterial occlusion (HCC) [I74.9] Lower limb ischemia [I99.8]  Secondary Diagnosis: Patient Active Problem List   Diagnosis Date Noted  . Ischemic 10/02/2015  . Leucocytosis 08/31/2014  . Postoperative anemia due to acute blood loss 08/31/2014  . Edema leg 08/31/2014  . Peripheral vascular disease in diabetes mellitus (HCC) 08/24/2014  . Hypokalemia   . Diabetes type 2, uncontrolled (HCC)   . Status post above knee amputation of left lower extremity (HCC)   . S/P femoropopliteal bypass surgery   . Delirium   . Alcohol abuse   . Tobacco abuse   . Benign essential hypertension antepartum   . Anxiety state   . Fever   . Pulmonary edema   . Surgery, other elective   . Low urine output 08/19/2014  . Fever of undetermined origin 08/19/2014  . Ischemia of site of left below knee amputation (HCC) 08/16/2014  . Visit for wound check-Left ABK 10/20/2013  . Wound dehiscence 10/13/2013  . Aftercare following surgery of the circulatory system, NEC 09/29/2013  . Confusion, postoperative 09/16/2013  . Unilateral complete BKA (HCC) 09/09/2013  . Ischemic leg 08/30/2013  . Incisional hernia without mention of obstruction or gangrene 04/06/2013  . PAD (peripheral artery disease) (HCC) 03/30/2013  . Peripheral vascular disease, unspecified (HCC) 01/22/2012  . Diabetes mellitus (HCC) 01/16/2012  . PVD (peripheral vascular disease) (HCC) 01/13/2012  . Uncontrolled hypertension   . GERD (gastroesophageal reflux disease)   . Nicotine addiction   . Anxiety   . Occlusion and stenosis of carotid artery without mention of cerebral infarction 10/02/2011  . Atherosclerosis of native arteries of the extremities with intermittent claudication 10/02/2011  . Claudication (HCC) 09/04/2011    . Stroke [facility]) 08/11/2011   Past Medical History  Diagnosis Date  . GERD (gastroesophageal reflux disease)   . Nicotine addiction   . ETOH abuse   . Anxiety   . Hypertension     no pcp     saw peter nisham once  . Peripheral vascular disease (HCC)   . Hyperlipidemia   . Collagen vascular disease (HCC)   . Type II diabetes mellitus (HCC)   . History of blood transfusion 08/2013    "maybe when he had his amputation"  . Chronic lower back pain   . Depression   . Bipolar disorder (HCC)   . PAD (peripheral artery disease) (  HCC)   . Stroke (HCC) 08/11/11    "slurred speech; no feeling elbow down on the right; lost hearing left ear" (08/16/2014)Right arm numb,        Medication List    TAKE these medications        ALPRAZolam 1 MG tablet  Commonly known as:  XANAX  Take 0.5 tablets (0.5 mg total) by mouth 3 (three) times daily.     amitriptyline 25 MG tablet  Commonly known as:  ELAVIL  Take 25 mg by mouth at bedtime.     amphetamine-dextroamphetamine 30 MG 24 hr capsule  Commonly known as:  ADDERALL XR  Take 30 mg by mouth daily.     aspirin 325 MG EC tablet  Take 325 mg by mouth daily.     atenolol 50 MG tablet  Commonly known as:  TENORMIN  Take 1 tablet (50 mg total) by mouth 2 (two) times daily.     atorvastatin 40 MG tablet  Commonly known as:  LIPITOR  Take 40 mg by mouth every morning.     buPROPion 100 MG tablet  Commonly known as:  WELLBUTRIN  Take 1 tablet (100 mg total) by mouth 2 (two) times daily.     cloNIDine 0.1 MG tablet  Commonly known as:  CATAPRES  Take 0.1 mg by mouth 2 (two) times daily.     gabapentin 300 MG capsule  Commonly known as:  NEURONTIN  Take 1 capsule (300 mg total) by mouth 3 (three) times daily.     glipiZIDE 10 MG tablet  Commonly known as:  GLUCOTROL  Take 10 mg by mouth daily.     hydrochlorothiazide 50 MG tablet  Commonly known as:  HYDRODIURIL  Take 0.5 tablets (25 mg total) by mouth daily.     insulin aspart  protamine- aspart (70-30) 100 UNIT/ML injection  Commonly known as:  NOVOLOG MIX 70/30  Inject 0-14 Units into the skin See admin instructions. 150-199=4 units 200-249=6 units 250-299=8 units 300-349=12 units 350-400=14 units     insulin glargine 100 UNIT/ML injection  Commonly known as:  LANTUS  Inject 0.4 mLs (40 Units total) into the skin at bedtime.     lisinopril 20 MG tablet  Commonly known as:  PRINIVIL,ZESTRIL  Take 20 mg by mouth 2 (two) times daily.     metFORMIN 850 MG tablet  Commonly known as:  GLUCOPHAGE  Take 850 mg by mouth 3 (three) times daily.     multivitamin with minerals Tabs tablet  Take 1 tablet by mouth daily.     omeprazole 20 MG capsule  Commonly known as:  PRILOSEC  Take 20 mg by mouth 2 (two) times daily before a meal.     Oxycodone HCl 10 MG Tabs  Take 1 tablet (10 mg total) by mouth every 6 (six) hours as needed.     potassium chloride 10 MEQ tablet  Commonly known as:  K-DUR  Take 10 mEq by mouth daily.     rivaroxaban 20 MG Tabs tablet  Commonly known as:  XARELTO  Take 1 tablet (20 mg total) by mouth daily with supper. Please start after you finish your xarelto starter pack.     SEROQUEL XR 400 MG 24 hr tablet  Generic drug:  QUEtiapine  Take 800 mg by mouth at bedtime.     sildenafil 20 MG tablet  Commonly known as:  REVATIO  Take 20 mg by mouth 2 (two) times daily.     traMADol 50  MG tablet  Commonly known as:  ULTRAM  Take 1 tablet (50 mg total) by mouth every 6 (six) hours as needed for moderate pain.        Prescriptions given: 1.  Roxicodone #30 No Refill  Instructions: 1.  Wash the groin wound with soap and water daily and pat dry. (No tub bath-only shower)  Then put a dry gauze or washcloth there to keep this area dry daily and as needed.  Do not use Vaseline or neosporin on your incisions.  Only use soap and water on your incisions and then protect and keep dry.  Disposition: home  Patient's condition: is  Fair  Follow up: 1. Dr. Darrick Penna in 2 weeks   Doreatha Massed, PA-C [facility] [phone] 10/05/2015  9:42 AM  - For VQI Registry use --- Instructions: Press F2 to tab through selections.  Delete question if not applicable.   Post-op:  Wound infection: No  Graft infection: No  Transfusion: No  If yes, n/a units given New Arrhythmia: No Ipsilateral amputation: No,  Minor,  BKA,  AKA Discharge patency:  Primary,  Primary assisted  [x ] Secondary (ax/fem bypass thrombectomy),  Occluded Patency judged by:  Dopper only, [x ] Palpable graft pulse in ax/fem bypass,  Palpable distal pulse,  ABI inc. > 0.15,  Duplex Discharge ABI: not done D/C Ambulatory Status: Ambulatory with Assistance  Complications: MI: No,  Troponin only,  EKG or Clinical CHF: No Resp failure:No,  Pneumonia,  Ventilator Chg in renal function: No,  Inc. Cr > 0.5,  Temp. Dialysis,  Permanent dialysis Stroke: No,  Minor,  Major Return to OR: No  Reason for return to OR:  Bleeding,  Infection,  Thrombosis,  Revision  Discharge medications: Statin use:  yes ASA use:  no Plavix use:  no Beta blocker use: yes ACEI use:   yes ARB use:  no Coumadin use: no Xarelto use:  Yes

## 2015-10-05 NOTE — Progress Notes (Addendum)
[facility]  Subjective  - no complaints, right foot numb   Objective 140/76 60 98.5 F (36.9 C) (Oral) 18 95%  Intake/Output Summary (Last 24 hours) at 10/05/15 [id number] Last data filed at 10/05/15 0640  Gross per 24 hour  Intake      0 ml  Output    425 ml  Net   -425 ml   Palpable ax fem pulse Right foot cool but non tender Incisions healing  Assessment/Planning: Right foot ischemia but tolerable to pt currently Will d/c home today Right AKA if worsening right foot pain, wounds or infection Follow up 2 weeks Decrease ASA to 81 mg to reduce bleeding risk  [name] BrunsFields, [name] [name] 10/05/2015 9:28 AM --  Laboratory Lab Results:  Recent Labs  10/02/15 1059 10/02/15 1654  WBC 10.9*  --   HGB 11.8* 9.9*  HCT 36.0* 29.0*  PLT 195  --    BMET  Recent Labs  10/02/15 1059 10/02/15 1654  NA 139 140  K 3.3* 3.6  CL 102  --   CO2 24  --   GLUCOSE 129*  --   BUN 11  --   CREATININE 0.91  --   CALCIUM 9.9  --     COAG Lab Results  Component Value Date   INR 1.23 10/02/2015   INR 1.10 12/27/2014   INR 1.08 11/22/2014   No results found for: PTT

## 2015-10-06 ENCOUNTER — Telehealth: Payer: Self-pay | Admitting: Vascular Surgery

## 2015-10-06 NOTE — Telephone Encounter (Signed)
-----   Message from Sharee PimpleMarilyn K McChesney, RN sent at 10/05/2015  9:59 AM EDT ----- Regarding: schedule   ----- Message -----    From: Dara LordsSamantha J Rhyne, PA-C    Sent: 10/05/2015   9:41 AM      To: [facility] Charge Pool  S/p Thrombectomy of right axillary femoral bypass, thrombectomy of right popliteal and tibial arteries  3 days post op.  F/u with [facility] in 2 weeks.  Thanks, Lelon MastSamantha

## 2015-10-06 NOTE — Telephone Encounter (Signed)
sched appt 4/20 at 10:30. Spoke to pt's mother to inform pt of appt.

## 2015-10-10 ENCOUNTER — Encounter: Payer: Self-pay | Admitting: Vascular Surgery

## 2015-10-18 ENCOUNTER — Ambulatory Visit (INDEPENDENT_AMBULATORY_CARE_PROVIDER_SITE_OTHER): Payer: Self-pay | Admitting: Vascular Surgery

## 2015-10-18 VITALS — BP 116/71 | HR 94 | Temp 98.5°F | Resp 20 | Ht 74.0 in | Wt 212.0 lb

## 2015-10-18 DIAGNOSIS — I739 Peripheral vascular disease, unspecified: Secondary | ICD-10-CM
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
## 2015-10-25 ENCOUNTER — Emergency Department (HOSPITAL_COMMUNITY): Payer: Medicaid Other

## 2015-10-25 ENCOUNTER — Inpatient Hospital Stay (HOSPITAL_COMMUNITY)
Admission: EM | Admit: 2015-10-25 | Discharge: 2015-11-04 | DRG: 239 | Disposition: A | Discharge status: Home Health | Payer: Medicaid Other | Attending: Internal Medicine | Admitting: Internal Medicine

## 2015-10-25 ENCOUNTER — Encounter (HOSPITAL_COMMUNITY): Payer: Self-pay | Admitting: Emergency Medicine

## 2015-10-25 ENCOUNTER — Encounter: Payer: Self-pay | Admitting: Family

## 2015-10-25 DIAGNOSIS — D649 Anemia, unspecified: Secondary | ICD-10-CM | POA: Diagnosis present

## 2015-10-25 DIAGNOSIS — F1410 Cocaine abuse, uncomplicated: Secondary | ICD-10-CM | POA: Diagnosis present

## 2015-10-25 DIAGNOSIS — F17210 Nicotine dependence, cigarettes, uncomplicated: Secondary | ICD-10-CM | POA: Diagnosis present

## 2015-10-25 DIAGNOSIS — R748 Abnormal levels of other serum enzymes: Secondary | ICD-10-CM

## 2015-10-25 DIAGNOSIS — I69328 Other speech and language deficits following cerebral infarction: Secondary | ICD-10-CM | POA: Diagnosis not present

## 2015-10-25 DIAGNOSIS — Z7901 Long term (current) use of anticoagulants: Secondary | ICD-10-CM

## 2015-10-25 DIAGNOSIS — G9340 Encephalopathy, unspecified: Secondary | ICD-10-CM | POA: Diagnosis present

## 2015-10-25 DIAGNOSIS — F1010 Alcohol abuse, uncomplicated: Secondary | ICD-10-CM | POA: Diagnosis present

## 2015-10-25 DIAGNOSIS — Z794 Long term (current) use of insulin: Secondary | ICD-10-CM | POA: Diagnosis not present

## 2015-10-25 DIAGNOSIS — E1152 Type 2 diabetes mellitus with diabetic peripheral angiopathy with gangrene: Secondary | ICD-10-CM | POA: Diagnosis present

## 2015-10-25 DIAGNOSIS — K219 Gastro-esophageal reflux disease without esophagitis: Secondary | ICD-10-CM | POA: Diagnosis present

## 2015-10-25 DIAGNOSIS — M6282 Rhabdomyolysis: Secondary | ICD-10-CM

## 2015-10-25 DIAGNOSIS — I2510 Atherosclerotic heart disease of native coronary artery without angina pectoris: Secondary | ICD-10-CM | POA: Diagnosis present

## 2015-10-25 DIAGNOSIS — Z9119 Patient's noncompliance with other medical treatment and regimen: Secondary | ICD-10-CM

## 2015-10-25 DIAGNOSIS — Z79891 Long term (current) use of opiate analgesic: Secondary | ICD-10-CM | POA: Diagnosis not present

## 2015-10-25 DIAGNOSIS — F1910 Other psychoactive substance abuse, uncomplicated: Secondary | ICD-10-CM | POA: Diagnosis present

## 2015-10-25 DIAGNOSIS — I69398 Other sequelae of cerebral infarction: Secondary | ICD-10-CM | POA: Diagnosis not present

## 2015-10-25 DIAGNOSIS — E11621 Type 2 diabetes mellitus with foot ulcer: Secondary | ICD-10-CM | POA: Diagnosis not present

## 2015-10-25 DIAGNOSIS — F1110 Opioid abuse, uncomplicated: Secondary | ICD-10-CM

## 2015-10-25 DIAGNOSIS — Z8249 Family history of ischemic heart disease and other diseases of the circulatory system: Secondary | ICD-10-CM | POA: Diagnosis not present

## 2015-10-25 DIAGNOSIS — R4182 Altered mental status, unspecified: Secondary | ICD-10-CM | POA: Diagnosis not present

## 2015-10-25 DIAGNOSIS — Z89612 Acquired absence of left leg above knee: Secondary | ICD-10-CM | POA: Diagnosis not present

## 2015-10-25 DIAGNOSIS — I10 Essential (primary) hypertension: Secondary | ICD-10-CM | POA: Diagnosis present

## 2015-10-25 DIAGNOSIS — E785 Hyperlipidemia, unspecified: Secondary | ICD-10-CM | POA: Diagnosis present

## 2015-10-25 DIAGNOSIS — F23 Brief psychotic disorder: Secondary | ICD-10-CM | POA: Diagnosis present

## 2015-10-25 DIAGNOSIS — R1310 Dysphagia, unspecified: Secondary | ICD-10-CM | POA: Diagnosis not present

## 2015-10-25 DIAGNOSIS — R740 Nonspecific elevation of levels of transaminase and lactic acid dehydrogenase [LDH]: Secondary | ICD-10-CM

## 2015-10-25 DIAGNOSIS — E43 Unspecified severe protein-calorie malnutrition: Secondary | ICD-10-CM | POA: Diagnosis present

## 2015-10-25 DIAGNOSIS — J320 Chronic maxillary sinusitis: Secondary | ICD-10-CM | POA: Diagnosis present

## 2015-10-25 DIAGNOSIS — R410 Disorientation, unspecified: Secondary | ICD-10-CM | POA: Diagnosis present

## 2015-10-25 DIAGNOSIS — Z7982 Long term (current) use of aspirin: Secondary | ICD-10-CM | POA: Diagnosis not present

## 2015-10-25 DIAGNOSIS — L97519 Non-pressure chronic ulcer of other part of right foot with unspecified severity: Secondary | ICD-10-CM | POA: Diagnosis not present

## 2015-10-25 DIAGNOSIS — F310 Bipolar disorder, current episode hypomanic: Secondary | ICD-10-CM | POA: Diagnosis present

## 2015-10-25 DIAGNOSIS — I96 Gangrene, not elsewhere classified: Secondary | ICD-10-CM | POA: Diagnosis not present

## 2015-10-25 DIAGNOSIS — Z9114 Patient's other noncompliance with medication regimen: Secondary | ICD-10-CM | POA: Diagnosis not present

## 2015-10-25 DIAGNOSIS — R109 Unspecified abdominal pain: Secondary | ICD-10-CM

## 2015-10-25 DIAGNOSIS — R7989 Other specified abnormal findings of blood chemistry: Secondary | ICD-10-CM | POA: Diagnosis not present

## 2015-10-25 DIAGNOSIS — R200 Anesthesia of skin: Secondary | ICD-10-CM | POA: Diagnosis present

## 2015-10-25 DIAGNOSIS — I998 Other disorder of circulatory system: Secondary | ICD-10-CM

## 2015-10-25 DIAGNOSIS — Z89611 Acquired absence of right leg above knee: Secondary | ICD-10-CM | POA: Diagnosis not present

## 2015-10-25 LAB — URINALYSIS, ROUTINE W REFLEX MICROSCOPIC
BILIRUBIN URINE: NEGATIVE
Glucose, UA: NEGATIVE mg/dL
Hgb urine dipstick: NEGATIVE
Ketones, ur: NEGATIVE mg/dL
LEUKOCYTES UA: NEGATIVE
NITRITE: NEGATIVE
PH: 5.5 (ref 5.0–8.0)
Protein, ur: NEGATIVE mg/dL
SPECIFIC GRAVITY, URINE: 1.019 (ref 1.005–1.030)

## 2015-10-25 LAB — COMPREHENSIVE METABOLIC PANEL
ALBUMIN: 2.4 g/dL — AB (ref 3.5–5.0)
ALT: 171 U/L — ABNORMAL HIGH (ref 17–63)
ANION GAP: 11 (ref 5–15)
AST: 107 U/L — ABNORMAL HIGH (ref 15–41)
Alkaline Phosphatase: 130 U/L — ABNORMAL HIGH (ref 38–126)
BILIRUBIN TOTAL: 0.5 mg/dL (ref 0.3–1.2)
BUN: 20 mg/dL (ref 6–20)
CHLORIDE: 100 mmol/L — AB (ref 101–111)
CO2: 24 mmol/L (ref 22–32)
Calcium: 9 mg/dL (ref 8.9–10.3)
Creatinine, Ser: 0.83 mg/dL (ref 0.61–1.24)
GFR calc Af Amer: >60 mL/min (ref >60)
GFR calc non Af Amer: >60 mL/min (ref >60)
Glucose, Bld: 141 mg/dL — ABNORMAL HIGH (ref 65–99)
POTASSIUM: 4.2 mmol/L (ref 3.5–5.1)
SODIUM: 135 mmol/L (ref 135–145)
Total Protein: 7.6 g/dL (ref 6.5–8.1)

## 2015-10-25 LAB — AMMONIA: Ammonia: 24 umol/L (ref 9–35)

## 2015-10-25 LAB — IRON AND TIBC
Iron: 22 ug/dL — ABNORMAL LOW (ref 45–182)
Saturation Ratios: 9 % — ABNORMAL LOW (ref 17.9–39.5)
TIBC: 251 ug/dL (ref 250–450)
UIBC: 229 ug/dL

## 2015-10-25 LAB — CBC WITH DIFFERENTIAL/PLATELET
BASOS ABS: 0 10*3/uL (ref 0.0–0.1)
BASOS ABS: 0 10*3/uL (ref 0.0–0.1)
BASOS PCT: 0 %
Basophils Relative: 0 %
EOS ABS: 0.2 10*3/uL (ref 0.0–0.7)
EOS PCT: 2 %
EOS PCT: 2 %
Eosinophils Absolute: 0.2 10*3/uL (ref 0.0–0.7)
HEMATOCRIT: 19.8 % — AB (ref 39.0–52.0)
HEMATOCRIT: 23.7 % — AB (ref 39.0–52.0)
Hemoglobin: 6 g/dL — CL (ref 13.0–17.0)
Hemoglobin: 7.4 g/dL — ABNORMAL LOW (ref 13.0–17.0)
LYMPHS PCT: 14 %
Lymphocytes Relative: 12 %
Lymphs Abs: 1.4 10*3/uL (ref 0.7–4.0)
Lymphs Abs: 1.7 10*3/uL (ref 0.7–4.0)
MCH: 24.2 pg — ABNORMAL LOW (ref 26.0–34.0)
MCH: 24.9 pg — ABNORMAL LOW (ref 26.0–34.0)
MCHC: 30.3 g/dL (ref 30.0–36.0)
MCHC: 31.2 g/dL (ref 30.0–36.0)
MCV: 79.8 fL (ref 78.0–100.0)
MCV: 79.8 fL (ref 78.0–100.0)
MONO ABS: 0.7 10*3/uL (ref 0.1–1.0)
MONOS PCT: 6 %
Monocytes Absolute: 0.8 10*3/uL (ref 0.1–1.0)
Monocytes Relative: 7 %
NEUTROS ABS: 9.2 10*3/uL — AB (ref 1.7–7.7)
Neutro Abs: 9.3 10*3/uL — ABNORMAL HIGH (ref 1.7–7.7)
Neutrophils Relative %: 80 %
Neutrophils Relative %: 77 %
PLATELETS: 668 10*3/uL — AB (ref 150–400)
PLATELETS: 553 10*3/uL — AB (ref 150–400)
RBC: 2.48 MIL/uL — ABNORMAL LOW (ref 4.22–5.81)
RBC: 2.97 MIL/uL — AB (ref 4.22–5.81)
RDW: 17 % — AB (ref 11.5–15.5)
RDW: 16.5 % — ABNORMAL HIGH (ref 11.5–15.5)
WBC: 11.6 10*3/uL — ABNORMAL HIGH (ref 4.0–10.5)
WBC: 12 10*3/uL — AB (ref 4.0–10.5)

## 2015-10-25 LAB — TROPONIN I: Troponin I: <0.03 ng/mL (ref <0.031)

## 2015-10-25 LAB — RETICULOCYTES
RBC.: 2.53 MIL/uL — AB (ref 4.22–5.81)
RETIC COUNT ABSOLUTE: 111.3 10*3/uL (ref 19.0–186.0)
Retic Ct Pct: 4.4 % — ABNORMAL HIGH (ref 0.4–3.1)

## 2015-10-25 LAB — RAPID URINE DRUG SCREEN, HOSP PERFORMED
Amphetamines: NOT DETECTED
BARBITURATES: NOT DETECTED
BENZODIAZEPINES: POSITIVE — AB
COCAINE: POSITIVE — AB
OPIATES: POSITIVE — AB
TETRAHYDROCANNABINOL: NOT DETECTED

## 2015-10-25 LAB — I-STAT CG4 LACTIC ACID, ED: LACTIC ACID, VENOUS: 1.19 mmol/L (ref 0.5–2.0)

## 2015-10-25 LAB — GLUCOSE, CAPILLARY
GLUCOSE-CAPILLARY: 117 mg/dL — AB (ref 65–99)
Glucose-Capillary: 112 mg/dL — ABNORMAL HIGH (ref 65–99)

## 2015-10-25 LAB — CK: CK TOTAL: 1119 U/L — AB (ref 49–397)

## 2015-10-25 LAB — VITAMIN B12: VITAMIN B 12: 1127 pg/mL — AB (ref 180–914)

## 2015-10-25 LAB — CBG MONITORING, ED
GLUCOSE-CAPILLARY: 131 mg/dL — AB (ref 65–99)
Glucose-Capillary: 110 mg/dL — ABNORMAL HIGH (ref 65–99)

## 2015-10-25 LAB — ETHANOL: Alcohol, Ethyl (B): <5 mg/dL (ref <5)

## 2015-10-25 LAB — FERRITIN: Ferritin: 212 ng/mL (ref 24–336)

## 2015-10-25 LAB — FOLATE: FOLATE: 31 ng/mL (ref >5.9)

## 2015-10-25 LAB — POC OCCULT BLOOD, ED: Fecal Occult Bld: NEGATIVE

## 2015-10-25 LAB — PREPARE RBC (CROSSMATCH)

## 2015-10-25 MED ORDER — HYDROMORPHONE HCL 1 MG/ML IJ SOLN
1.0000 mg | Freq: Once | INTRAMUSCULAR | Status: AC
  Administered 2015-10-25: 1 mg via INTRAVENOUS
  Filled 2015-10-25: qty 1

## 2015-10-25 MED ORDER — ALPRAZOLAM 0.25 MG PO TABS
0.5000 mg | ORAL_TABLET | Freq: Three times a day (TID) | ORAL | Status: DC | PRN
  Filled 2015-10-25: qty 2

## 2015-10-25 MED ORDER — FOLIC ACID 1 MG PO TABS
1.0000 mg | ORAL_TABLET | Freq: Every day | ORAL | Status: DC
  Administered 2015-10-25 – 2015-11-04 (×8): 1 mg via ORAL
  Filled 2015-10-25 (×9): qty 1

## 2015-10-25 MED ORDER — MORPHINE SULFATE (PF) 2 MG/ML IV SOLN
1.0000 mg | INTRAVENOUS | Status: DC | PRN
  Administered 2015-10-25 – 2015-11-01 (×11): 1 mg via INTRAVENOUS
  Filled 2015-10-25 (×12): qty 1

## 2015-10-25 MED ORDER — QUETIAPINE FUMARATE ER 400 MG PO TB24
400.0000 mg | ORAL_TABLET | Freq: Every day | ORAL | Status: DC
  Administered 2015-10-28 – 2015-10-29 (×2): 400 mg via ORAL
  Filled 2015-10-25 (×5): qty 1

## 2015-10-25 MED ORDER — VANCOMYCIN HCL 10 G IV SOLR
2000.0000 mg | Freq: Once | INTRAVENOUS | Status: AC
  Administered 2015-10-25: 2000 mg via INTRAVENOUS
  Filled 2015-10-25: qty 2000

## 2015-10-25 MED ORDER — SODIUM CHLORIDE 0.9% FLUSH
3.0000 mL | Freq: Two times a day (BID) | INTRAVENOUS | Status: DC
  Administered 2015-10-25 – 2015-11-04 (×15): 3 mL via INTRAVENOUS

## 2015-10-25 MED ORDER — ADULT MULTIVITAMIN W/MINERALS CH
1.0000 | ORAL_TABLET | Freq: Every day | ORAL | Status: DC
  Administered 2015-10-25 – 2015-11-04 (×8): 1 via ORAL
  Filled 2015-10-25 (×9): qty 1

## 2015-10-25 MED ORDER — THIAMINE HCL 100 MG/ML IJ SOLN
100.0000 mg | Freq: Every day | INTRAMUSCULAR | Status: DC
  Administered 2015-10-26 – 2015-10-28 (×3): 100 mg via INTRAVENOUS
  Filled 2015-10-25 (×3): qty 2

## 2015-10-25 MED ORDER — LORAZEPAM 2 MG/ML IJ SOLN
1.0000 mg | Freq: Four times a day (QID) | INTRAMUSCULAR | Status: DC | PRN
  Administered 2015-10-25: 1 mg via INTRAVENOUS
  Filled 2015-10-25: qty 1

## 2015-10-25 MED ORDER — SODIUM CHLORIDE 0.9 % IV SOLN
Freq: Once | INTRAVENOUS | Status: AC
  Administered 2015-10-25: 10:00:00 via INTRAVENOUS

## 2015-10-25 MED ORDER — DOCUSATE SODIUM 100 MG PO CAPS: 100.0000 mg | ORAL_CAPSULE | Freq: Every day | ORAL | Status: DC | PRN

## 2015-10-25 MED ORDER — SODIUM CHLORIDE 0.9 % IV SOLN
INTRAVENOUS | Status: AC
  Administered 2015-10-25: 10:00:00 via INTRAVENOUS

## 2015-10-25 MED ORDER — VITAMIN B-1 100 MG PO TABS
100.0000 mg | ORAL_TABLET | Freq: Every day | ORAL | Status: DC
  Administered 2015-10-25 – 2015-11-04 (×8): 100 mg via ORAL
  Filled 2015-10-25 (×8): qty 1

## 2015-10-25 MED ORDER — BUPROPION HCL 100 MG PO TABS
100.0000 mg | ORAL_TABLET | Freq: Two times a day (BID) | ORAL | Status: DC
  Administered 2015-10-25 – 2015-11-04 (×14): 100 mg via ORAL
  Filled 2015-10-25 (×23): qty 1

## 2015-10-25 MED ORDER — HEPARIN SODIUM (PORCINE) 5000 UNIT/ML IJ SOLN
5000.0000 [IU] | Freq: Three times a day (TID) | INTRAMUSCULAR | Status: DC
  Administered 2015-10-25 – 2015-10-31 (×17): 5000 [IU] via SUBCUTANEOUS
  Filled 2015-10-25 (×16): qty 1

## 2015-10-25 MED ORDER — VANCOMYCIN HCL IN DEXTROSE 1-5 GM/200ML-% IV SOLN
1000.0000 mg | Freq: Three times a day (TID) | INTRAVENOUS | Status: DC
  Administered 2015-10-26 – 2015-10-27 (×4): 1000 mg via INTRAVENOUS
  Filled 2015-10-25 (×7): qty 200

## 2015-10-25 MED ORDER — LORAZEPAM 1 MG PO TABS: 1.0000 mg | ORAL_TABLET | Freq: Four times a day (QID) | ORAL | Status: DC | PRN

## 2015-10-25 MED ORDER — ONDANSETRON HCL 4 MG PO TABS: 4.0000 mg | ORAL_TABLET | Freq: Four times a day (QID) | ORAL | Status: DC | PRN

## 2015-10-25 MED ORDER — INSULIN ASPART 100 UNIT/ML ~~LOC~~ SOLN
0.0000 [IU] | SUBCUTANEOUS | Status: DC
  Administered 2015-10-26: 1 [IU] via SUBCUTANEOUS
  Administered 2015-10-26: 2 [IU] via SUBCUTANEOUS
  Administered 2015-10-26: 1 [IU] via SUBCUTANEOUS
  Administered 2015-10-26 – 2015-10-28 (×11): 2 [IU] via SUBCUTANEOUS
  Administered 2015-10-28: 1 [IU] via SUBCUTANEOUS
  Administered 2015-10-28 – 2015-10-29 (×3): 2 [IU] via SUBCUTANEOUS
  Administered 2015-10-29: 1 [IU] via SUBCUTANEOUS
  Administered 2015-10-29 – 2015-10-30 (×6): 2 [IU] via SUBCUTANEOUS
  Administered 2015-10-30: 5 [IU] via SUBCUTANEOUS
  Administered 2015-10-30 – 2015-10-31 (×3): 1 [IU] via SUBCUTANEOUS
  Administered 2015-10-31: 2 [IU] via SUBCUTANEOUS
  Administered 2015-10-31: 1 [IU] via SUBCUTANEOUS
  Administered 2015-11-01 (×2): 2 [IU] via SUBCUTANEOUS
  Administered 2015-11-01 (×2): 1 [IU] via SUBCUTANEOUS
  Administered 2015-11-01 (×2): 3 [IU] via SUBCUTANEOUS
  Administered 2015-11-02: 1 [IU] via SUBCUTANEOUS
  Administered 2015-11-02: 2 [IU] via SUBCUTANEOUS
  Administered 2015-11-02 (×2): 1 [IU] via SUBCUTANEOUS

## 2015-10-25 MED ORDER — PIPERACILLIN-TAZOBACTAM 3.375 G IVPB
3.3750 g | Freq: Three times a day (TID) | INTRAVENOUS | Status: AC
  Administered 2015-10-26 – 2015-11-01 (×20): 3.375 g via INTRAVENOUS
  Filled 2015-10-25 (×22): qty 50

## 2015-10-25 MED ORDER — ATORVASTATIN CALCIUM 40 MG PO TABS
40.0000 mg | ORAL_TABLET | Freq: Every day | ORAL | Status: DC
  Administered 2015-10-28 – 2015-11-03 (×8): 40 mg via ORAL
  Filled 2015-10-25 (×7): qty 1

## 2015-10-25 MED ORDER — ONDANSETRON HCL 4 MG/2ML IJ SOLN: 4.0000 mg | Freq: Four times a day (QID) | INTRAMUSCULAR | Status: DC | PRN

## 2015-10-25 MED ORDER — ONDANSETRON HCL 4 MG/2ML IJ SOLN
4.0000 mg | Freq: Once | INTRAMUSCULAR | Status: AC
  Administered 2015-10-25: 4 mg via INTRAVENOUS
  Filled 2015-10-25: qty 2

## 2015-10-25 MED ORDER — LORAZEPAM 2 MG/ML IJ SOLN
2.0000 mg | INTRAMUSCULAR | Status: DC | PRN
  Administered 2015-10-25: 3 mg via INTRAVENOUS
  Administered 2015-10-26: 2 mg via INTRAVENOUS
  Administered 2015-10-26: 3 mg via INTRAVENOUS
  Administered 2015-10-27 – 2015-10-29 (×8): 2 mg via INTRAVENOUS
  Filled 2015-10-25: qty 2
  Filled 2015-10-25 (×2): qty 1
  Filled 2015-10-25: qty 2
  Filled 2015-10-25 (×7): qty 1
  Filled 2015-10-25: qty 2

## 2015-10-25 MED ORDER — ASPIRIN EC 81 MG PO TBEC
81.0000 mg | DELAYED_RELEASE_TABLET | Freq: Every day | ORAL | Status: DC
  Administered 2015-10-25 – 2015-11-04 (×8): 81 mg via ORAL
  Filled 2015-10-25 (×9): qty 1

## 2015-10-25 MED ORDER — PIPERACILLIN-TAZOBACTAM 3.375 G IVPB 30 MIN
3.3750 g | Freq: Once | INTRAVENOUS | Status: AC
  Administered 2015-10-25: 3.375 g via INTRAVENOUS
  Filled 2015-10-25: qty 50

## 2015-10-25 NOTE — ED Notes (Signed)
IV team paged regarding infiltration

## 2015-10-25 NOTE — ED Notes (Signed)
Pt became very agitated with visitor and nursing staff.  Pt would not cooperate with blood pressure and was pulling at IV sites.  [facility]  Site was WNL.  PRN Ativan given

## 2015-10-25 NOTE — Progress Notes (Signed)
Patient recently admitted to [facility] from ED.  He is very restless in the bed, with visual and auditory hallucinations.  Not interacting with the staff at the bedside.  CIAW 27.  Significant other at bedside.  Selena BattenKim RN notified MD, awaiting on call back.  RN to call if assistance needed.

## 2015-10-25 NOTE — ED Notes (Signed)
CBG 110. 

## 2015-10-25 NOTE — ED Notes (Signed)
Attempted report 
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
## 2015-10-25 NOTE — Progress Notes (Signed)
Pt was brought to floor approx. 5 PM very confused and moving all around making sounds such as barking,  Crying,mouthing  and very disorientated, 1 mg of morphine given to pt to try to calm down, without success, Dr Ala DachFord was called regarding the pt unstable being, dr's came up to assess pt is to be moved down to step unit, but until then we have a sitter watching for his safety.    His girlfriend is also with the patient, pt has a history of Left AKA, Right ischemic foot a hx of drug abuse, gerd, smoker ETOH, anxiety PVD, stroke, depression, bipolar, psch problems.  Pt was incont. Of urine and changed on a CIWA scale the pt would be approx. A 27, pt is also NPO due to the ischemic foot.
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
## 2015-10-25 NOTE — ED Notes (Signed)
IV team returned page and reports to elevate extremity but no other measures are required at this time.  Extremity elevated.

## 2015-10-25 NOTE — Progress Notes (Signed)
  Pharmacy Antibiotic Note  Leida LauthRobert Degen is a 50 y.o. male admitted on 10/25/2015 with cellulitis.  Pharmacy has been consulted for vanc/zosyn dosing. Vascular Surgery consulted, may need amputation. AF, wbc 11.6, SCr 0.83 on admit, CrCl>100.  Plan: Vanc 2g IV x1; then 1g IV q8h Zosyn 3.375g IV (30min inf) x1; then 3.375g IV q8h (4h inf) Monitor clinical progress, c/s, renal function, abx plan/LOT VT@SS      Temp (24hrs), Avg:98.5 F (36.9 C), Min:98.4 F (36.9 C), Max:98.5 F (36.9 C)   Recent Labs Lab 10/25/15 0651 10/25/15 0655  WBC  --  11.6*  CREATININE  --  0.83  LATICACIDVEN 1.19  --     Estimated Creatinine Clearance: 125.2 mL/min (by C-G formula based on Cr of 0.83).    No Known Allergies  Antimicrobials this admission: 4/27 vanc >>  4/27 zosyn >>   Dose adjustments this admission: n/a  Microbiology results: 4/27 BCx:    Babs BertinHaley Deliana Avalos, PharmD, BCPS Clinical Pharmacist Pager [phone] 10/25/2015 8:07 AM

## 2015-10-25 NOTE — ED Notes (Signed)
0.5mg  Xanax administered with applesauce at this time.

## 2015-10-25 NOTE — ED Notes (Signed)
Pt very agitated.  Pulling at chords and d/c IV.  Attempted to redirect pt with no success.  Pt continues to use inappropriate words and sounds.  Pt will follow commands but will not respond appropriately.

## 2015-10-25 NOTE — ED Notes (Signed)
  CBG 131  
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
## 2015-10-26 ENCOUNTER — Ambulatory Visit: Payer: Self-pay | Admitting: Family

## 2015-10-26 DIAGNOSIS — R410 Disorientation, unspecified: Secondary | ICD-10-CM | POA: Diagnosis present

## 2015-10-26 DIAGNOSIS — R4182 Altered mental status, unspecified: Secondary | ICD-10-CM

## 2015-10-26 LAB — GLUCOSE, CAPILLARY
GLUCOSE-CAPILLARY: 149 mg/dL — AB (ref 65–99)
GLUCOSE-CAPILLARY: 162 mg/dL — AB (ref 65–99)
Glucose-Capillary: 125 mg/dL — ABNORMAL HIGH (ref 65–99)
Glucose-Capillary: 135 mg/dL — ABNORMAL HIGH (ref 65–99)
Glucose-Capillary: 169 mg/dL — ABNORMAL HIGH (ref 65–99)
Glucose-Capillary: 165 mg/dL — ABNORMAL HIGH (ref 65–99)

## 2015-10-26 LAB — COMPREHENSIVE METABOLIC PANEL
ALT: 91 U/L — AB (ref 17–63)
AST: 31 U/L (ref 15–41)
Albumin: 2.2 g/dL — ABNORMAL LOW (ref 3.5–5.0)
Alkaline Phosphatase: 107 U/L (ref 38–126)
Anion gap: 10 (ref 5–15)
BILIRUBIN TOTAL: 0.5 mg/dL (ref 0.3–1.2)
BUN: 12 mg/dL (ref 6–20)
CHLORIDE: 107 mmol/L (ref 101–111)
CO2: 24 mmol/L (ref 22–32)
CREATININE: 1.2 mg/dL (ref 0.61–1.24)
Calcium: 8.9 mg/dL (ref 8.9–10.3)
GFR calc Af Amer: >60 mL/min (ref >60)
Glucose, Bld: 149 mg/dL — ABNORMAL HIGH (ref 65–99)
Potassium: 3.9 mmol/L (ref 3.5–5.1)
Sodium: 141 mmol/L (ref 135–145)
Total Protein: 7.6 g/dL (ref 6.5–8.1)

## 2015-10-26 LAB — CBC
HEMATOCRIT: 25.8 % — AB (ref 39.0–52.0)
HEMOGLOBIN: 7.8 g/dL — AB (ref 13.0–17.0)
MCH: 24.4 pg — ABNORMAL LOW (ref 26.0–34.0)
MCHC: 30.2 g/dL (ref 30.0–36.0)
MCV: 80.6 fL (ref 78.0–100.0)
Platelets: 515 10*3/uL — ABNORMAL HIGH (ref 150–400)
RBC: 3.2 MIL/uL — AB (ref 4.22–5.81)
RDW: 17.3 % — ABNORMAL HIGH (ref 11.5–15.5)
WBC: 12.5 10*3/uL — AB (ref 4.0–10.5)

## 2015-10-26 LAB — PROTIME-INR
INR: 1.39 (ref 0.00–1.49)
PROTHROMBIN TIME: 17.2 s — AB (ref 11.6–15.2)

## 2015-10-26 MED ORDER — KCL IN DEXTROSE-NACL 40-5-0.9 MEQ/L-%-% IV SOLN
INTRAVENOUS | Status: DC
  Administered 2015-10-26 – 2015-10-27 (×3): via INTRAVENOUS
  Administered 2015-10-27: 100 mL/h via INTRAVENOUS
  Administered 2015-10-28: 20:00:00 via INTRAVENOUS
  Administered 2015-10-28: 100 mL/h via INTRAVENOUS
  Administered 2015-10-29 (×2): via INTRAVENOUS
  Filled 2015-10-26 (×12): qty 1000

## 2015-10-26 MED ORDER — HALOPERIDOL LACTATE 5 MG/ML IJ SOLN: 5.0000 mg | Freq: Four times a day (QID) | INTRAMUSCULAR | Status: DC | PRN

## 2015-10-26 MED ORDER — DOCUSATE SODIUM 50 MG/5ML PO LIQD
50.0000 mg | Freq: Every day | ORAL | Status: DC
  Administered 2015-10-29 – 2015-11-02 (×4): 50 mg via ORAL
  Filled 2015-10-26 (×11): qty 10

## 2015-10-26 MED ORDER — HALOPERIDOL LACTATE 5 MG/ML IJ SOLN
2.0000 mg | Freq: Four times a day (QID) | INTRAMUSCULAR | Status: DC | PRN
  Administered 2015-10-26 – 2015-10-28 (×6): 2 mg via INTRAVENOUS
  Filled 2015-10-26 (×6): qty 1

## 2015-10-26 NOTE — [place] FL2 (Addendum)
[place] MEDICAID FL2 LEVEL OF CARE SCREENING TOOL     IDENTIFICATION  Patient Name: [name] LauthRobert Estey Birthdate: 1965-11-01 Sex: male Admission Date (Current Location): 10/25/2015  [facility] [place]  [employer]   Facility and Address:  [facility], [address], [place], [place] [address]      Provider Number: [id number]  Attending Physician Name and Address:  Earl LagosNischal Narendra, MD  Relative Name and Phone Number:       Current Level of Care: SNF Recommended Level of Care: Skilled Nursing Facility Prior Approval Number:    Date Approved/Denied:   PASRR Number:   [id number]   Discharge Plan: SNF    Current Diagnoses: Patient Active Problem List   Diagnosis Date Noted  . Acute delirium 10/26/2015  . Gangrene of foot (HCC) 10/25/2015  . Absolute anemia   . Encephalopathy acute   . Ischemic 10/02/2015  . Leucocytosis 08/31/2014  . Postoperative anemia due to acute blood loss 08/31/2014  . Edema leg 08/31/2014  . Peripheral vascular disease in diabetes mellitus (HCC) 08/24/2014  . Hypokalemia   . Diabetes type 2, uncontrolled (HCC)   . Status post above knee amputation of left lower extremity (HCC)   . S/P femoropopliteal bypass surgery   . Delirium   . Alcohol abuse   . Tobacco abuse   . Benign essential hypertension antepartum   . Anxiety state   . Fever   . Pulmonary edema   . Surgery, other elective   . Low urine output 08/19/2014  . Fever of undetermined origin 08/19/2014  . Ischemia of site of left below knee amputation (HCC) 08/16/2014  . Visit for wound check-Left ABK 10/20/2013  . Wound dehiscence 10/13/2013  . Aftercare following surgery of the circulatory system, NEC 09/29/2013  . Confusion, postoperative 09/16/2013  . Unilateral complete BKA (HCC) 09/09/2013  . Ischemic leg 08/30/2013  . Incisional hernia without mention of obstruction or gangrene 04/06/2013  . PAD (peripheral artery disease) (HCC) 03/30/2013  .  Peripheral vascular disease, unspecified (HCC) 01/22/2012  . Diabetes mellitus (HCC) 01/16/2012  . PVD (peripheral vascular disease) (HCC) 01/13/2012  . Uncontrolled hypertension   . GERD (gastroesophageal reflux disease)   . Nicotine addiction   . Anxiety   . Occlusion and stenosis of carotid artery without mention of cerebral infarction 10/02/2011  . Atherosclerosis of native arteries of the extremities with intermittent claudication 10/02/2011  . Claudication (HCC) 09/04/2011  . Stroke (HCC) 08/11/2011    Orientation RESPIRATION BLADDER Height & Weight        Normal Continent, External catheter Weight: 177 lb 14.6 oz (80.7 kg) Height:  6\' 2"  (188 cm)  BEHAVIORAL SYMPTOMS/MOOD NEUROLOGICAL BOWEL NUTRITION STATUS   (Disoriented x 4)  (Acute Delirium, Stroke) Continent  (NPO)  AMBULATORY STATUS COMMUNICATION OF NEEDS Skin   Extensive Assist Verbally Surgical wounds                       Personal Care Assistance Level of Assistance  Bathing, Feeding, Dressing Bathing Assistance: Limited assistance Feeding assistance: Independent Dressing Assistance: Limited assistance     Functional Limitations Info  Sight, Hearing, Speech Sight Info: Adequate Hearing Info: Adequate Speech Info: Adequate    SPECIAL CARE FACTORS FREQUENCY  PT (By licensed PT), OT (By licensed OT), Diabetic urine testing     PT Frequency: 5 x week OT Frequency: 5 x week            Contractures Contractures Info:  Not present    Additional Factors Info  Code Status, Allergies Code Status Info: Full Allergies Info: NKDA           Current Medications (10/26/2015):  This is the current hospital active medication list Current Facility-Administered Medications  Medication Dose Route Frequency Provider Last Rate Last Dose  . aspirin EC tablet 81 mg  81 mg Oral Daily Courtney Paris, MD   81 mg at 10/25/15 1024  . atorvastatin (LIPITOR) tablet 40 mg  40 mg Oral q1800 Courtney Paris, MD      .  buPROPion [facility]) tablet 100 mg  100 mg Oral BID Courtney Paris, MD   100 mg at 10/25/15 1024  . dextrose 5 % and 0.9 % NaCl with KCl 40 mEq/L infusion   Intravenous Continuous Rushil Terrilee Croak, MD 100 mL/hr at 10/26/15 0939    . docusate (COLACE) 50 MG/5ML liquid 50 mg  50 mg Oral Daily Selina Cooley, MD   50 mg at 10/26/15 1224  . docusate sodium (COLACE) capsule 100 mg  100 mg Oral Daily PRN Courtney Paris, MD      . folic acid (FOLVITE) tablet 1 mg  1 mg Oral Daily Courtney Paris, MD   1 mg at 10/25/15 1224  . haloperidol lactate (HALDOL) injection 2 mg  2 mg Intravenous Q6H PRN Beather Arbour, MD   2 mg at 10/26/15 0843  . heparin injection 5,000 Units  5,000 Units Subcutaneous Q8H Courtney Paris, MD   5,000 Units at 10/26/15 1324  . insulin aspart (novoLOG) injection 0-9 Units  0-9 Units Subcutaneous Q4H Courtney Paris, MD   2 Units at 10/26/15 1324  . LORazepam (ATIVAN) injection 2-3 mg  2-3 mg Intravenous Q1H PRN Earl Lagos, MD   3 mg at 10/26/15 0416  . morphine 2 MG/ML injection 1 mg  1 mg Intravenous Q2H PRN Courtney Paris, MD   1 mg at 10/26/15 1101  . multivitamin with minerals tablet 1 tablet  1 tablet Oral Daily Courtney Paris, MD   1 tablet at 10/25/15 1224  . ondansetron (ZOFRAN) tablet 4 mg  4 mg Oral Q6H PRN Courtney Paris, MD       Or  . ondansetron [facility]) injection 4 mg  4 mg Intravenous Q6H PRN Courtney Paris, MD      . piperacillin-tazobactam (ZOSYN) IVPB 3.375 g  3.375 g Intravenous [address] RPH   3.375 g at 10/26/15 0844  . QUEtiapine (SEROQUEL XR) 24 hr tablet 400 mg  400 mg Oral QHS Courtney Paris, MD   400 mg at 10/25/15 2200  . sodium chloride flush (NS) 0.9 % injection 3 mL  3 mL Intravenous Q12H Courtney Paris, MD   3 mL at 10/26/15 0945  . thiamine (VITAMIN B-1) tablet 100 mg  100 mg Oral Daily Courtney Paris, MD   100 mg at 10/25/15 1224   Or  . thiamine (B-1) injection 100 mg  100 mg Intravenous Daily Courtney Paris, MD   100 mg at 10/26/15 0944  . vancomycin (VANCOCIN)  IVPB 1000 mg/200 mL premix  1,000 mg Intravenous Q8H Almon Hercules, RPH   1,000 mg at 10/26/15 0945     Discharge Medications: Please see discharge summary for a list of discharge medications.  Relevant Imaging Results:  Relevant Lab Results:   Additional Information SS#: [id number]  Margarito Liner, LCSW
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
## 2015-10-26 NOTE — Progress Notes (Signed)
Patient has become very paranoid, agitated, and combative with staff. Labs are to be held until pt has calmed down. Ativan given and pain medicine. MD notified pt change in behavior and increased confusion, no new orders given by MD at this time.

## 2015-10-26 NOTE — Clinical Social Work Note (Signed)
Clinical Social Work Assessment  Patient Details  Name: [name] [name] MRN: [id number] Date of Birth: 07-24-1965  Date of referral:  10/26/15               Reason for consult:  Mental Health Concerns, Facility Placement, Discharge Planning                Permission sought to share information with:  Facility Medical sales representativeContact Representative, Family Supports Permission granted to share information::     Name::     Vinnie LangtonHelen Jones  Agency::  SNF  Relationship::  Mother  Contact Information:  [phone]  Housing/Transportation Living arrangements for the past 2 months:  Apartment Source of Information:  Medical Team, Parent Patient Interpreter Needed:  None Criminal Activity/Legal Involvement Pertinent to Current Situation/Hospitalization:  No - Comment as needed Significant Relationships:  Significant Other, Parents Lives with:  Self Do you feel safe going back to the place where you live?  Yes Need for family participation in patient care:  Yes (Comment)  Care giving concerns:  PT recommends SNF placement.   Social Worker assessment / plan:  CSW called patient's mother, Vinnie LangtonHelen Jones [phone]) due to patient being disoriented to self, time, place, and situation. CSW introduced role and explained that discharge planning would be discussed. Patient's mother aware that patient is hospitalized. Discussed SNF recommendation. Patient's [facility] Patient's mother has no preference on facility because she stated that she did not have much information on the facilities. No further concerns. CSW will continue to follow patient and his mother for support and will facilitate discharge once medically stable. CSW encouraged patient's mother to call CSW as needed.  Employment status:  Other (Comment) (Unknown.) Insurance information:  [place] PT Recommendations:  Skilled Nursing Facility Information / Referral to community resources:  Skilled Nursing  Facility  Patient/Family's Response to care:  Patient disoriented. Patient's mother agreeable to SNF placement. Patient's mother aware of hospitalization and supportive. Patient's mother polite and appreciated social work intervention.  Patient/Family's Understanding of and Emotional Response to Diagnosis, Current Treatment, and Prognosis:  Patient's mother aware of hospitalization and current disorientation. Aware of recommendation for SNF placement once medically stable for discharge.  Emotional Assessment Appearance:  Other (Comment Required (CSW spoke with patient's mother on phone due to patient being fully disoriented.) Attitude/Demeanor/Rapport:  Unable to Assess Affect (typically observed):  Unable to Assess Orientation:   (Disoriented x 4) Alcohol / Substance use:  Illicit Drugs, Tobacco Use, Alcohol Use Psych involvement (Current and /or in the community):  Outpatient Provider  Discharge Needs  Concerns to be addressed:  Care Coordination, Mental Health Concerns, Decision making concerns Readmission within the last 30 days:  Yes Current discharge risk:  Cognitively Impaired, Dependent with Mobility, Lives alone, Psychiatric Illness, Substance Abuse Barriers to Discharge:  No Barriers Identified   Margarito LinerSarah C Marlia Schewe, LCSW 10/26/2015, 3:53 PM

## 2015-10-26 NOTE — Progress Notes (Addendum)
[facility]   Subjective  - Not coherent.     Objective 151/81 74 98.4 F (36.9 C) (Axillary) 22 100%  Intake/Output Summary (Last 24 hours) at 10/26/15 0743 Last data filed at 10/26/15 0321  Gross per 24 hour  Intake    585 ml  Output    600 ml  Net    -15 ml    Ischemic right foot Heart RRR Lungs non labored breathing  Swallowing difficulty per family and nursing  Assessment/Planning: Ischemic right foot.   Plan right AKA Plan swallowing study? EGD? of note, his drug screen was positive for benzo, opiates and cocaine He is unable to take his Seroquel due to swallowing difficulty Continue IV zosyn and vancomycin Hold Xarelto for 48 Hgb 7.4 after 1 unit of PRBC    COLLINS, EMMA MAUREEN 10/26/2015 7:43 AM -- Agree with above. Above knee amputation early next week if pt delirium clears and he consents to operation.  At his prior office visit he refused amputation so will need to clarify once pt is lucid.  Fabienne Brunsharles Fields, MD [facility] [place] Office: [phone] Pager: [phone]  Laboratory Lab Results:  Recent Labs  10/25/15 0655 10/25/15 1454  WBC 11.6* 12.0*  HGB 6.0* 7.4*  HCT 19.8* 23.7*  PLT 668* 553*   BMET  Recent Labs  10/25/15 0655  NA 135  K 4.2  CL 100*  CO2 24  GLUCOSE 141*  BUN 20  CREATININE 0.83  CALCIUM 9.0    COAG Lab Results  Component Value Date   INR 1.23 10/02/2015   INR 1.10 12/27/2014   INR 1.08 11/22/2014   No results found for: PTT

## 2015-10-26 NOTE — Clinical Social Work Placement (Signed)
   CLINICAL SOCIAL WORK PLACEMENT  NOTE  Date:  10/26/2015  Patient Details  Name: [name] LauthRobert [name] MRN: [id number] Date of Birth: 11/08/1965  Clinical Social Work is seeking post-discharge placement for this patient at the Skilled  Nursing Facility level of care (*CSW will initial, date and re-position this form in  chart as items are completed):  Yes   Patient/family provided with [facility] Clinical Social Work Department's list of facilities offering this level of care within the geographic area requested by the patient (or if unable, by the patient's family).  Yes   Patient/family informed of their freedom to choose among providers that offer the needed level of care, that participate in Medicare, Medicaid or managed care program needed by the patient, have an available bed and are willing to accept the patient.  Yes   Patient/family informed of [facility]'s ownership interest in [facility] and [facility], as well as of the fact that they are under no obligation to receive care at these facilities.  PASRR submitted to EDS on       PASRR number received on       Existing PASRR number confirmed on       FL2 transmitted to all facilities in geographic area requested by pt/family on       FL2 transmitted to all facilities within larger geographic area on       Patient informed that his/her managed care company has contracts with or will negotiate with certain facilities, including the following:            Patient/family informed of bed offers received.  Patient chooses bed at       Physician recommends and patient chooses bed at      Patient to be transferred to   on  .  Patient to be transferred to facility by       Patient family notified on   of transfer.  Name of family member notified:        PHYSICIAN       Additional Comment:    _______________________________________________ Margarito LinerSarah C Tanisha Lutes, LCSW 10/26/2015, 4:09 PM

## 2015-10-26 NOTE — Care Management Note (Signed)
Case Management Note  Patient Details  Name: Leida LauthRobert Bovey MRN: [id number] Date of Birth: 1965-07-03  Subjective/Objective:    Patient is from home alone, presents with ganfrene of foot, Delirum , withdrawal on ciwa protocol.  Per pt eval rec SNF.  CSW referral.                 Action/Plan:   Expected Discharge Date:                  Expected Discharge Plan:  Skilled Nursing Facility  In-House Referral:  Clinical Social Work  Discharge planning Services  CM Consult  Post Acute Care Choice:    Choice offered to:     DME Arranged:    DME Agency:     HH Arranged:    HH Agency:     Status of Service:  In process, will continue to follow  Medicare Important Message Given:    Date Medicare IM Given:    Medicare IM give by:    Date Additional Medicare IM Given:    Additional Medicare Important Message give by:     If discussed at Long Length of Stay Meetings, dates discussed:    Additional Comments:  Leone Havenaylor, Alivea Gladson Clinton, RN 10/26/2015, 1:56 PM

## 2015-10-26 NOTE — Progress Notes (Signed)
Pt very agitated and hallucinating. He responds to pain but will not follow exams. Significant other at bedside. Haldol given per PRN order. Will continue to monitor.

## 2015-10-26 NOTE — Progress Notes (Addendum)
Subjective:  Patient was rapidly alternating between somnolence, grimacing, and grunting. He appeared to be responding to internal stimuli. He was non-verbal. Girlfriend was at bedside and supportive.  Objective: Vital signs in last 24 hours: Filed Vitals:   10/25/15 2247 10/26/15 0320 10/26/15 0700 10/26/15 1200  BP: 149/78 151/81 161/83   Pulse: 95 74 97   Temp: 98.4 F (36.9 C) 98.4 F (36.9 C) 97.4 F (36.3 C) 99 F (37.2 C)  TempSrc: Axillary Axillary Oral Oral  Resp: 26 22 29    Height: 6\' 2"  (1.88 m)     Weight: 177 lb 14.6 oz (80.7 kg)     SpO2: 98% 100% 100%    Weight change:   Intake/Output Summary (Last 24 hours) at 10/26/15 1440 Last data filed at 10/26/15 1200  Gross per 24 hour  Intake    500 ml  Output    800 ml  Net   -300 ml   Physical Exam General: Lying in bed, intermittently somnolent and in apparent distress HEENT: Dry MM, No scleral icuterus Cardiovascular: RRR no m/r/g Pulmonary: CTAB, intermittently apneic and snoring while somnolent Abdominal: Soft, NT. Normal BS Extremities: Left AKA. Right foot cool to touch to ankle, and warm superiorly. Sloughing on dorsum of foot. Grimaces when RLE touched Neurological: Unable to follow commands. Face symmetric Psychiatric: Labile affect. Responding to internal stimuli.  Lab Results: Basic Metabolic Panel:  Recent Labs Lab 10/25/15 0655  NA 135  K 4.2  CL 100*  CO2 24  GLUCOSE 141*  BUN 20  CREATININE 0.83  CALCIUM 9.0   Liver Function Tests:  Recent Labs Lab 10/25/15 0655  AST 107*  ALT 171*  ALKPHOS 130*  BILITOT 0.5  PROT 7.6  ALBUMIN 2.4*    Recent Labs Lab 10/25/15 0813  AMMONIA 24   CBC:  Recent Labs Lab 10/25/15 0655 10/25/15 1454 10/26/15 0938  WBC 11.6* 12.0* 12.5*  NEUTROABS 9.3* 9.2*  --   HGB 6.0* 7.4* 7.8*  HCT 19.8* 23.7* 25.8*  MCV 79.8 79.8 80.6  PLT 668* 553* 515*   Cardiac Enzymes:  Recent Labs Lab 10/25/15 0655 10/25/15 1454  CKTOTAL 1119*   --   TROPONINI <0.03 <0.03   CBG:  Recent Labs Lab 10/25/15 0551 10/25/15 1223 10/25/15 1948 10/25/15 2252 10/26/15 0730 10/26/15 1244  GLUCAP 131* 110* 112* 117* 135* 169*   Anemia Panel:  Recent Labs Lab 10/25/15 0821  VITAMINB12 1127*  FOLATE 31.0  FERRITIN 212  TIBC 251  IRON 22*  RETICCTPCT 4.4*   Urine Drug Screen: Drugs of Abuse     Component Value Date/Time   LABOPIA POSITIVE* 10/25/2015 0800   COCAINSCRNUR POSITIVE* 10/25/2015 0800   LABBENZ POSITIVE* 10/25/2015 0800   AMPHETMU NONE DETECTED 10/25/2015 0800   THCU NONE DETECTED 10/25/2015 0800   LABBARB NONE DETECTED 10/25/2015 0800    Alcohol Level:  Recent Labs Lab 10/25/15 0655  ETH <5   Urinalysis:  Recent Labs Lab 10/25/15 0800  COLORURINE YELLOW  LABSPEC 1.019  PHURINE 5.5  GLUCOSEU NEGATIVE  HGBUR NEGATIVE  BILIRUBINUR NEGATIVE  KETONESUR NEGATIVE  PROTEINUR NEGATIVE  NITRITE NEGATIVE  LEUKOCYTESUR NEGATIVE    Studies/Results: Ct Head Wo Contrast  10/25/2015  CLINICAL DATA:  Acute mental status changes, hypertension, peripheral vascular disease, remote left frontal infarct EXAM: CT HEAD WITHOUT CONTRAST TECHNIQUE: Contiguous axial images were obtained from the base of the skull through the vertex without contrast. COMPARISON:  08/13/2011 FINDINGS: Encephalomalacia in the left high frontal lobe  from a remote left MCA territory infarct. No acute intracranial hemorrhage, mass lesion, new infarction, midline shift, herniation, hydrocephalus, or extra-axial fluid collection. Remote right occipital PCA territory infarct also noted, image 19. Cisterns remain patent. No cerebellar abnormality. Orbits appear symmetric. Polypoid mucosal thickening in the maxillary sinuses bilaterally. Other sinuses remain clear. Left mastoid effusion noted. Right mastoid is clear. IMPRESSION: Areas of remote infarct in the left frontal and right occipital lobes. No acute intracranial finding by noncontrast CT  Polypoid bilateral maxillary sinus disease. Left mastoid effusion versus mastoiditis. Electronically Signed   By: Judie Petit.  Shick M.D.   On: 10/25/2015 07:54   Dg Foot Complete Right  10/25/2015  CLINICAL DATA:  Pain and infection in the right foot. EXAM: RIGHT FOOT COMPLETE - 3+ VIEW COMPARISON:  None. FINDINGS: Status post great toe amputation at the proximal phalanx. Bone fragments at the osteotomy are likely postsurgical. No definitive erosion from osteomyelitis. No opaque foreign body or soft tissue gas. First MTP osteoarthritis. IMPRESSION: Partial great toe amputation. Associated bone fragments are likely postsurgical - no definitive osteomyelitis. No soft tissue gas. Electronically Signed   By: Marnee Spring M.D.   On: 10/25/2015 06:20   Medications: I have reviewed the patient's current medications. Scheduled Meds: . aspirin EC  81 mg Oral Daily  . atorvastatin  40 mg Oral q1800  . buPROPion  100 mg Oral BID  . docusate  50 mg Oral Daily  . folic acid  1 mg Oral Daily  . heparin subcutaneous  5,000 Units Subcutaneous Q8H  . insulin aspart  0-9 Units Subcutaneous Q4H  . multivitamin with minerals  1 tablet Oral Daily  . piperacillin-tazobactam (ZOSYN)  IV  3.375 g Intravenous Q8H  . QUEtiapine  400 mg Oral QHS  . sodium chloride flush  3 mL Intravenous Q12H  . thiamine  100 mg Oral Daily   Or  . thiamine  100 mg Intravenous Daily  . vancomycin  1,000 mg Intravenous Q8H   Continuous Infusions: . dextrose 5 % and 0.9 % NaCl with KCl 40 mEq/L 100 mL/hr at 10/26/15 0939   PRN Meds:.docusate sodium, haloperidol lactate, LORazepam, morphine injection, ondansetron **OR** ondansetron (ZOFRAN) IV Assessment/Plan:  Altered Mental Status: Likely multifactorial. Patient's girlfriend suggests that he is not a recent heavy alcohol user; his urine was positive for cocaine, benzos, and opioids.  Alcohol abuse is included in his chart. Patient had also not been taking his Seroquel as well. The  patient's girlfriend confirms that "she has never seen him like this." He was unable to tolerate po seroquel overnight. Therefore, we will attempt IV neuroleptics and consult with psychiatry. Patient may also be septic from his ischemic foot, contributing to the present phenomenon. CT head only shows remote infarcts from previous stroke - Haldol IV 5 mg q6h prn, resume Seroquel at 200 mg qhs with gradual uptitration once stabilized - CIWA Protocol - Sitter at bedside - q2h neuro check - Thiamine, folate - D5 NS + KCl at 100 cc/hr  Right Foot Gangrene: Patient may have spontaneously improved perfusion to the calf area, as this is warm to touch today. Foot remains cool. Patient continues to be unable to consent for right foot amputations. He is now 24 hours off Xarelto - needs to be 48 hours off to be medically cleared for amputation. No fevers. On antibiotics. We appreciate vascular surgery for following this patient. - Vanc and Zosyn IV per pharm - Holding Xarelto - NPO - PT/OT eval once able  to participate  Elevated AST, ALT: Could be related alcohol abuse (although denies) and/or elevated CK 2/2 ischemic foot. - CMP in AM  Normocytic Anemia: Consistent with ACD given low normal TIBC. Presenting Hgb 6 and received 1U pRBCs. Hgb 7.8 today. - CBC in AM  T2DM: CBGs in 100s. Restart basal insulin once tolerating PO. - SSI  HTN: Stable. Holding home atenolol, HCTZ, Clonidine, Lisinopril - Monitor for clonidine rebound HTN  DVT PPx: Heparin [place]  Dispo: Disposition is deferred at this time, awaiting improvement of current medical problems.  Anticipated discharge in approximately 4-5 day(s).   The patient does have a current PCP (Pcp Not In System) and does not need an [facility] hospital follow-up appointment after discharge.  The patient does have transportation limitations that hinder transportation to clinic appointments.  .Services Needed at time of discharge: Y = Yes, Blank = No PT:   OT:    RN:   Equipment:   Other:     LOS: 1 day   Ruben Im, MD 10/26/2015, 2:40 PM

## 2015-10-26 NOTE — Progress Notes (Signed)
Patient arrived to room 3s07 from [address] [place]. Patient currently having auditory and visual hallucinations, Patient disoriented x4 and unable to communicate with nurse. Patient restless and fidgeting. Patient unable to answer questions asked by nurse.

## 2015-10-27 ENCOUNTER — Inpatient Hospital Stay (HOSPITAL_COMMUNITY): Payer: Medicaid Other

## 2015-10-27 LAB — TYPE AND SCREEN
ABO/RH(D): A POS
ANTIBODY SCREEN: NEGATIVE
UNIT DIVISION: 0
UNIT DIVISION: 0

## 2015-10-27 LAB — COMPREHENSIVE METABOLIC PANEL
ALK PHOS: 106 U/L (ref 38–126)
ALT: 84 U/L — ABNORMAL HIGH (ref 17–63)
ANION GAP: 10 (ref 5–15)
AST: 41 U/L (ref 15–41)
Albumin: 2.1 g/dL — ABNORMAL LOW (ref 3.5–5.0)
BUN: 9 mg/dL (ref 6–20)
CO2: 22 mmol/L (ref 22–32)
Calcium: 8.8 mg/dL — ABNORMAL LOW (ref 8.9–10.3)
Chloride: 109 mmol/L (ref 101–111)
Creatinine, Ser: 1.23 mg/dL (ref 0.61–1.24)
GLUCOSE: 182 mg/dL — AB (ref 65–99)
Potassium: 4.5 mmol/L (ref 3.5–5.1)
SODIUM: 141 mmol/L (ref 135–145)
TOTAL PROTEIN: 7.8 g/dL (ref 6.5–8.1)
Total Bilirubin: 0.5 mg/dL (ref 0.3–1.2)

## 2015-10-27 LAB — CBC
HCT: 24 % — ABNORMAL LOW (ref 39.0–52.0)
HEMOGLOBIN: 7.2 g/dL — AB (ref 13.0–17.0)
MCH: 24.5 pg — AB (ref 26.0–34.0)
MCHC: 30 g/dL (ref 30.0–36.0)
MCV: 81.6 fL (ref 78.0–100.0)
PLATELETS: 556 10*3/uL — AB (ref 150–400)
RBC: 2.94 MIL/uL — ABNORMAL LOW (ref 4.22–5.81)
RDW: 17.4 % — ABNORMAL HIGH (ref 11.5–15.5)
WBC: 9.9 10*3/uL (ref 4.0–10.5)

## 2015-10-27 LAB — GLUCOSE, CAPILLARY
GLUCOSE-CAPILLARY: 180 mg/dL — AB (ref 65–99)
GLUCOSE-CAPILLARY: 172 mg/dL — AB (ref 65–99)
GLUCOSE-CAPILLARY: 172 mg/dL — AB (ref 65–99)
GLUCOSE-CAPILLARY: 158 mg/dL — AB (ref 65–99)
Glucose-Capillary: 155 mg/dL — ABNORMAL HIGH (ref 65–99)
Glucose-Capillary: 153 mg/dL — ABNORMAL HIGH (ref 65–99)

## 2015-10-27 LAB — VANCOMYCIN, TROUGH: VANCOMYCIN TR: 26 ug/mL — AB (ref 10.0–20.0)

## 2015-10-27 LAB — VANCOMYCIN, RANDOM: Vancomycin Rm: 13 ug/mL

## 2015-10-27 MED ORDER — WHITE PETROLATUM GEL
Status: AC
  Administered 2015-10-27: 0.2
  Filled 2015-10-27: qty 1

## 2015-10-27 MED ORDER — VANCOMYCIN HCL IN DEXTROSE 750-5 MG/150ML-% IV SOLN
750.0000 mg | Freq: Two times a day (BID) | INTRAVENOUS | Status: AC
  Administered 2015-10-28 – 2015-11-01 (×11): 750 mg via INTRAVENOUS
  Filled 2015-10-27 (×12): qty 150

## 2015-10-27 MED ORDER — BISACODYL 10 MG RE SUPP
10.0000 mg | Freq: Once | RECTAL | Status: AC
  Administered 2015-10-27: 10 mg via RECTAL
  Filled 2015-10-27: qty 1

## 2015-10-27 NOTE — Progress Notes (Signed)
  Pharmacy Antibiotic Note  [name] [name] is a 50 y.o. male admitted on 10/25/2015 with cellulitis.  Pharmacy has been consulted for vanc/zosyn dosing. Vascular Surgery consulted, may need amputation.   Patient is afebrile and wbc is now within normal limits. Vancomycin trough this AM is 26- drawn 1 hr prior to next dose due. Patient has accumulated at q8h dosing. SCr is starting to rise at 1.23 today. UOP good.   Plan: Discontinue vancomycin due to high level. Recheck vancomycin trough in 12 hours to determine if safe to re-dose.  Continue Zosyn 3.375g IV q8h (4h inf) Monitor clinical progress, c/s, renal function, abx plan/LOT  Height: 6\' 2"  (188 cm) Weight: 177 lb 14.6 oz (80.7 kg) IBW/kg (Calculated) : 82.2  Temp (24hrs), Avg:98.6 F (37 C), Min:98.3 F (36.8 C), Max:99 F (37.2 C)   Recent Labs Lab 10/25/15 0651 10/25/15 0655 10/25/15 1454 10/26/15 0938 10/26/15 1439 10/27/15 0430 10/27/15 0756  WBC  --  11.6* 12.0* 12.5*  --  9.9  --   CREATININE  --  0.83  --   --  1.20 1.23  --   LATICACIDVEN 1.19  --   --   --   --   --   --   VANCOTROUGH  --   --   --   --   --   --  26*    Estimated Creatinine Clearance: 82.9 mL/min (by C-G formula based on Cr of 1.23).    No Known Allergies  Antimicrobials this admission: 4/27 vanc >>  4/27 zosyn >>   Dose adjustments this admission: 4/29 VT 26 on 1g IV q8h >> holding and rechecking in 12 hours  Microbiology results: 4/27 BCx: ngtd  [name] [name], PharmD, BCPS Clinical Pharmacist [phone]  10/27/2015 9:25 AM

## 2015-10-27 NOTE — Progress Notes (Signed)
Dr. Allena KatzPatel in patient's room.  Informed him of critical vanc trough level of 26 from this morning, and that I had stopped the infusion within 3 minutes of starting it.  It is on hold for now.

## 2015-10-27 NOTE — Progress Notes (Signed)
Patient attempting to climb out of bed, pulling gown off, close to pulling on IV line.  Mittens placed and CIWA protocol followed.

## 2015-10-27 NOTE — Progress Notes (Signed)
Dr. Heide SparkNarendra text paged critical vanc trough of 26 drawn at 0756 this morning.

## 2015-10-27 NOTE — Progress Notes (Signed)
Subjective: This morning, his girlfriend was at bedside. She reports that his mental status is improving to the point where he has articulated statements to her which are reassuring. Her family will be coming in later today.   I was informed by RN this morning that vanc trough was elevated so dose was held per Pharmacy.  Objective: Vital signs in last 24 hours: Filed Vitals:   10/26/15 1938 10/26/15 2322 10/27/15 0344 10/27/15 0700  BP: 142/93 138/82 140/78 139/90  Pulse: 94 99 80 90  Temp: 98.4 F (36.9 C) 98.5 F (36.9 C) 98.3 F (36.8 C) 98.8 F (37.1 C)  TempSrc: Axillary Axillary Oral Oral  Resp: Height:      Weight:      SpO2: 97% 95% 99% 98%   Weight change:   Intake/Output Summary (Last 24 hours) at 10/27/15 1124 Last data filed at 10/27/15 0345  Gross per 24 hour  Intake 1241.67 ml  Output   1650 ml  Net -408.33 ml   Physical Exam General: Lying in bed, sleeping though intermittently opening his eyes HEENT: Atraumatic, No scleral icuterus Cardiovascular: Tachycardic, no m/r/g Pulmonary: CTAB, intermittently apneic and snoring while somnolent Abdominal: Soft, some grimacing noted while I was palpating his abdomen, umbilical hernia Extremities: Left AKA. Right foot cool to touch to ankle, and warm superiorly. Sloughing on dorsum of foot. Grimaces when RLE touched which appears stable. Neurological: Unable to follow commands. Cannot assess alertness and orientation.  Psychiatric: Labile affect. Responding to internal stimuli.  Lab Results: Basic Metabolic Panel:  Recent Labs Lab 10/26/15 1439 10/27/15 0430  NA 141 141  K 3.9 4.5  CL 107 109  CO2 24 22  GLUCOSE 149* 182*  BUN 12 9  CREATININE 1.20 1.23  CALCIUM 8.9 8.8*   Liver Function Tests:  Recent Labs Lab 10/26/15 1439 10/27/15 0430  AST 31 41  ALT 91* 84*  ALKPHOS 107 106  BILITOT 0.5 0.5  PROT 7.6 7.8  ALBUMIN 2.2* 2.1*    Recent Labs Lab 10/25/15 0813  AMMONIA 24     CBC:  Recent Labs Lab 10/25/15 0655 10/25/15 1454 10/26/15 0938 10/27/15 0430  WBC 11.6* 12.0* 12.5* 9.9  NEUTROABS 9.3* 9.2*  --   --   HGB 6.0* 7.4* 7.8* 7.2*  HCT 19.8* 23.7* 25.8* 24.0*  MCV 79.8 79.8 80.6 81.6  PLT 668* 553* 515* 556*   Cardiac Enzymes:  Recent Labs Lab 10/25/15 0655 10/25/15 1454  CKTOTAL 1119*  --   TROPONINI <0.03 <0.03   CBG:  Recent Labs Lab 10/26/15 1244 10/26/15 1643 10/26/15 1937 10/26/15 2321 10/27/15 0343 10/27/15 0738  GLUCAP 169* 149* 162* 165* 180* 172*   Anemia Panel:  Recent Labs Lab 10/25/15 0821  VITAMINB12 1127*  FOLATE 31.0  FERRITIN 212  TIBC 251  IRON 22*  RETICCTPCT 4.4*   Urine Drug Screen: Drugs of Abuse     Component Value Date/Time   LABOPIA POSITIVE* 10/25/2015 0800   COCAINSCRNUR POSITIVE* 10/25/2015 0800   LABBENZ POSITIVE* 10/25/2015 0800   AMPHETMU NONE DETECTED 10/25/2015 0800   THCU NONE DETECTED 10/25/2015 0800   LABBARB NONE DETECTED 10/25/2015 0800    Alcohol Level:  Recent Labs Lab 10/25/15 0655  ETH <5   Urinalysis:  Recent Labs Lab 10/25/15 0800  COLORURINE YELLOW  LABSPEC 1.019  PHURINE 5.5  GLUCOSEU NEGATIVE  HGBUR NEGATIVE  BILIRUBINUR NEGATIVE  KETONESUR NEGATIVE  PROTEINUR NEGATIVE  NITRITE NEGATIVE  LEUKOCYTESUR NEGATIVE  Studies/Results: Dg Abd Portable 1v  10/27/2015  CLINICAL DATA:  Patient with severe abdominal pain, left lower quadrant. EXAM: PORTABLE ABDOMEN - 1 VIEW COMPARISON:  CT abdomen pelvis 11/21/2014. FINDINGS: Lung bases are clear. Gas is demonstrated within nondilated loops of large and small bowel in a nonobstructed pattern. Supine evaluation limited for the detection of free intraperitoneal air. Unremarkable osseous skeleton. IMPRESSION: Nonobstructed bowel gas pattern. Electronically Signed   By: Annia Belt M.D.   On: 10/27/2015 10:26   Medications: I have reviewed the patient's current medications. Scheduled Meds: . aspirin EC  81 mg  Oral Daily  . atorvastatin  40 mg Oral q1800  . buPROPion  100 mg Oral BID  . docusate  50 mg Oral Daily  . folic acid  1 mg Oral Daily  . heparin subcutaneous  5,000 Units Subcutaneous Q8H  . insulin aspart  0-9 Units Subcutaneous Q4H  . multivitamin with minerals  1 tablet Oral Daily  . piperacillin-tazobactam (ZOSYN)  IV  3.375 g Intravenous Q8H  . QUEtiapine  400 mg Oral QHS  . sodium chloride flush  3 mL Intravenous Q12H  . thiamine  100 mg Oral Daily   Or  . thiamine  100 mg Intravenous Daily   Continuous Infusions: . dextrose 5 % and 0.9 % NaCl with KCl 40 mEq/L 100 mL/hr at 10/27/15 0819   PRN Meds:.docusate sodium, haloperidol lactate, LORazepam, morphine injection, ondansetron **OR** ondansetron (ZOFRAN) IV Assessment/Plan:  Altered Mental Status: Likely multifactorial in setting of sepsis, polysubstance abuse, mental illness. Patient's girlfriend suggests that he is not a recent heavy alcohol user; his urine was positive for cocaine, benzos, and opioids.  Alcohol abuse is included in his chart. Patient had also not been taking his Seroquel as well and still unable to tolerate. Psychiatry agrees with Haldol IV. CT head only shows remote infarcts from previous stroke - Check KUB today to assess enema vs suppository to prevent constipation - Haldol IV 5 mg q6h prn, resume Seroquel at 200 mg qhs with gradual uptitration once stabilized - CIWA Protocol - Sitter at bedside - q2h neuro check - Thiamine, folate - D5 NS + KCl at 100 cc/hr - Consult chaplain for prayer as family is amenable to it  Right Foot Gangrene: Foot remains cool. Patient continues to be unable to consent for right foot amputations. He is now 48 hours off Xarelto but limited by mental status. No fevers. On antibiotics. We appreciate vascular surgery for following this patient. - Vanc and Zosyn IV per pharm - Holding Xarelto - NPO - PT/OT eval once able to participate  Elevated AST, ALT: Could be related  alcohol abuse (although denies) and/or elevated CK 2/2 ischemic foot. ALT remains elevated 2/1 ratio.  - CMP in AM  Normocytic Anemia: Consistent with ACD given low normal TIBC. Presenting Hgb 6 and received 1U pRBCs. Hgb 7.8 today. - CBC in AM  T2DM: CBGs in 160s-180. On home insulin. - SSI  HTN: BP trending 130s-160/70s-80s. Holding home atenolol, HCTZ, Clonidine, Lisinopril - Continue assessing, especially for rebound HTN  DVT PPx: Heparin [place]  Dispo: Disposition is deferred at this time, awaiting improvement of current medical problems.    The patient does have a current PCP (Pcp Not In System) and does not need an [facility] hospital follow-up appointment after discharge.  The patient does have transportation limitations that hinder transportation to clinic appointments.  .Services Needed at time of discharge: Y = Yes, Blank = No PT:   OT:  RN:   Equipment:   Other:     LOS: 2 days   Beather Arbourushil Colene Mines V, MD 10/27/2015, 11:24 AM

## 2015-10-27 NOTE — Evaluation (Signed)
SLP Cancellation Note  Patient Details Name: [name] [name] MRN: [id number] DOB: 04-24-1966   Cancelled treatment:       Reason Eval/Treat Not Completed: Fatigue/lethargy limiting ability to participate (rn reports pt is lethargic, she recently gave him medicine to help him relax, will reattempt eval later)   Donavan Burnetamara Joshuwa Vecchio, MS [facility] SLP [phone]

## 2015-10-27 NOTE — Progress Notes (Signed)
  Pharmacy Antibiotic Note  [name] [name] is a 50 y.o. male admitted on 10/25/2015 with cellulitis.  Pharmacy has been consulted for vanc/zosyn dosing. Vascular Surgery consulted, may need amputation.  -Vancomycin trough this am was 26 (at ~ 8am) and recheck at 8:30pm was 13 (~ 12 hours apart) -last vancomycin dose was 1000mg  at midnight (patient noted on 1000mg  IV q8h)   Plan: Change vancomycin to 750mg  IV q12h Continue Zosyn 3.375g IV q8h (4h inf) Monitor clinical progress, c/s, renal function, abx plan/LOT  Height: 6\' 2"  (188 cm) Weight: 177 lb 14.6 oz (80.7 kg) IBW/kg (Calculated) : 82.2  Temp (24hrs), Avg:98.7 F (37.1 C), Min:98.3 F (36.8 C), Max:99.5 F (37.5 C)   Recent Labs Lab 10/25/15 0651 10/25/15 0655 10/25/15 1454 10/26/15 0938 10/26/15 1439 10/27/15 0430 10/27/15 0756 10/27/15 2032  WBC  --  11.6* 12.0* 12.5*  --  9.9  --   --   CREATININE  --  0.83  --   --  1.20 1.23  --   --   LATICACIDVEN 1.19  --   --   --   --   --   --   --   VANCOTROUGH  --   --   --   --   --   --  1026*  --   VANCORANDOM  --   --   --   --   --   --   --  13    Estimated Creatinine Clearance: 82.9 mL/min (by C-G formula based on Cr of 1.23).    No Known Allergies  Antimicrobials this admission: 4/27 vanc >>  4/27 zosyn >>   Dose adjustments this admission: 4/29 VT 26 on 1g IV q8h >> holding and rechecking in 12 hours 4/29 VR 13  Microbiology results: 4/27 BCx: ngtd  [name] GermanAndrew Priyana [name], Pharm D 10/27/2015 10:03 PM

## 2015-10-28 DIAGNOSIS — Z794 Long term (current) use of insulin: Secondary | ICD-10-CM

## 2015-10-28 LAB — GLUCOSE, CAPILLARY
GLUCOSE-CAPILLARY: 168 mg/dL — AB (ref 65–99)
GLUCOSE-CAPILLARY: 164 mg/dL — AB (ref 65–99)
GLUCOSE-CAPILLARY: 165 mg/dL — AB (ref 65–99)
Glucose-Capillary: 159 mg/dL — ABNORMAL HIGH (ref 65–99)
Glucose-Capillary: 144 mg/dL — ABNORMAL HIGH (ref 65–99)
Glucose-Capillary: 181 mg/dL — ABNORMAL HIGH (ref 65–99)

## 2015-10-28 LAB — COMPREHENSIVE METABOLIC PANEL
ALK PHOS: 108 U/L (ref 38–126)
ALT: 92 U/L — ABNORMAL HIGH (ref 17–63)
ANION GAP: 9 (ref 5–15)
AST: 51 U/L — ABNORMAL HIGH (ref 15–41)
Albumin: 2 g/dL — ABNORMAL LOW (ref 3.5–5.0)
BILIRUBIN TOTAL: 0.4 mg/dL (ref 0.3–1.2)
BUN: 7 mg/dL (ref 6–20)
CALCIUM: 8.9 mg/dL (ref 8.9–10.3)
CO2: 20 mmol/L — ABNORMAL LOW (ref 22–32)
Chloride: 115 mmol/L — ABNORMAL HIGH (ref 101–111)
Creatinine, Ser: 1.14 mg/dL (ref 0.61–1.24)
GLUCOSE: 183 mg/dL — AB (ref 65–99)
POTASSIUM: 4.6 mmol/L (ref 3.5–5.1)
Sodium: 144 mmol/L (ref 135–145)
TOTAL PROTEIN: 7.2 g/dL (ref 6.5–8.1)

## 2015-10-28 LAB — CBC
HCT: 24.5 % — ABNORMAL LOW (ref 39.0–52.0)
HEMOGLOBIN: 7.4 g/dL — AB (ref 13.0–17.0)
MCH: 25.2 pg — ABNORMAL LOW (ref 26.0–34.0)
MCHC: 30.2 g/dL (ref 30.0–36.0)
MCV: 83.3 fL (ref 78.0–100.0)
Platelets: 486 10*3/uL — ABNORMAL HIGH (ref 150–400)
RBC: 2.94 MIL/uL — AB (ref 4.22–5.81)
RDW: 17.3 % — ABNORMAL HIGH (ref 11.5–15.5)
WBC: 9.9 10*3/uL (ref 4.0–10.5)

## 2015-10-28 NOTE — Progress Notes (Signed)
Subjective:  He remains confused, not able to talk except stating his name once with the RN overnight. Had some agitation, received haldol and ativan per CIWA. Remains unable to consent for surgery. His girlfriend states his mother had given consent in the past for blood transfusion and other procedures and wonders if she can do that same for his surgery.    Objective: Vital signs in last 24 hours: Filed Vitals:   10/27/15 2321 10/28/15 0352 10/28/15 0819 10/28/15 0825  BP: 162/87 158/92  171/87  Pulse: 89 101  111  Temp: 98.1 F (36.7 C) 98.3 F (36.8 C) 98.3 F (36.8 C)   TempSrc: Oral Oral    Resp: 25 29    Height:      Weight:      SpO2: 98% 98% 96%    Weight change:   Intake/Output Summary (Last 24 hours) at 10/28/15 1017 Last data filed at 10/28/15 [id number]  Gross per 24 hour  Intake   1100 ml  Output   2075 ml  Net   -975 ml   Physical Exam General: Lying in bed, sleeping though intermittently opening his eyes. Calm.  HEENT: Atraumatic, No scleral icuterus Cardiovascular: Tachycardic, no m/r/g Pulmonary: CTAB, intermittently apneic and snoring while somnolent Abdominal: Soft,  umbilical hernia Extremities: Left AKA. Right foot cool to touch to ankle, and warm superiorly. Sloughing on dorsum of foot. Some foul smell noted. Neurological: Unable to follow commands. Cannot assess alertness and orientation.   Lab Results: Basic Metabolic Panel:  Recent Labs Lab 10/27/15 0430 10/28/15 0623  NA 141 144  K 4.5 4.6  CL 109 115*  CO2 22 20*  GLUCOSE 182* 183*  BUN 9 7  CREATININE 1.23 1.14  CALCIUM 8.8* 8.9   Liver Function Tests:  Recent Labs Lab 10/27/15 0430 10/28/15 0623  AST 41 51*  ALT 84* 92*  ALKPHOS 106 108  BILITOT 0.5 0.4  PROT 7.8 7.2  ALBUMIN 2.1* 2.0*    Recent Labs Lab 10/25/15 0813  AMMONIA 24   CBC:  Recent Labs Lab 10/25/15 0655 10/25/15 1454  10/27/15 0430 10/28/15 0623  WBC 11.6* 12.0*  < > 9.9 9.9  NEUTROABS 9.3*  9.2*  --   --   --   HGB 6.0* 7.4*  < > 7.2* 7.4*  HCT 19.8* 23.7*  < > 24.0* 24.5*  MCV 79.8 79.8  < > 81.6 83.3  PLT 668* 553*  < > 556* 486*  < > = values in this interval not displayed. Cardiac Enzymes:  Recent Labs Lab 10/25/15 0655 10/25/15 1454  CKTOTAL 1119*  --   TROPONINI <0.03 <0.03   CBG:  Recent Labs Lab 10/27/15 1228 10/27/15 1438 10/27/15 1926 10/27/15 2320 10/28/15 0349 10/28/15 0821  GLUCAP 155* 172* 153* 158* 159* 168*   Anemia Panel:  Recent Labs Lab 10/25/15 0821  VITAMINB12 1127*  FOLATE 31.0  FERRITIN 212  TIBC 251  IRON 22*  RETICCTPCT 4.4*   Urine Drug Screen: Drugs of Abuse     Component Value Date/Time   LABOPIA POSITIVE* 10/25/2015 0800   COCAINSCRNUR POSITIVE* 10/25/2015 0800   LABBENZ POSITIVE* 10/25/2015 0800   AMPHETMU NONE DETECTED 10/25/2015 0800   THCU NONE DETECTED 10/25/2015 0800   LABBARB NONE DETECTED 10/25/2015 0800    Alcohol Level:  Recent Labs Lab 10/25/15 0655  ETH <5   Urinalysis:  Recent Labs Lab 10/25/15 0800  COLORURINE YELLOW  LABSPEC 1.019  PHURINE 5.5  GLUCOSEU NEGATIVE  HGBUR  NEGATIVE  BILIRUBINUR NEGATIVE  KETONESUR NEGATIVE  PROTEINUR NEGATIVE  NITRITE NEGATIVE  LEUKOCYTESUR NEGATIVE    Studies/Results: Dg Abd Portable 1v  10/27/2015  CLINICAL DATA:  Patient with severe abdominal pain, left lower quadrant. EXAM: PORTABLE ABDOMEN - 1 VIEW COMPARISON:  CT abdomen pelvis 11/21/2014. FINDINGS: Lung bases are clear. Gas is demonstrated within nondilated loops of large and small bowel in a nonobstructed pattern. Supine evaluation limited for the detection of free intraperitoneal air. Unremarkable osseous skeleton. IMPRESSION: Nonobstructed bowel gas pattern. Electronically Signed   By: Annia Belt M.D.   On: 10/27/2015 10:26   Medications: I have reviewed the patient's current medications. Scheduled Meds: . aspirin EC  81 mg Oral Daily  . atorvastatin  40 mg Oral q1800  . buPROPion  100 mg  Oral BID  . docusate  50 mg Oral Daily  . folic acid  1 mg Oral Daily  . heparin subcutaneous  5,000 Units Subcutaneous Q8H  . insulin aspart  0-9 Units Subcutaneous Q4H  . multivitamin with minerals  1 tablet Oral Daily  . piperacillin-tazobactam (ZOSYN)  IV  3.375 g Intravenous Q8H  . QUEtiapine  400 mg Oral QHS  . sodium chloride flush  3 mL Intravenous Q12H  . thiamine  100 mg Oral Daily   Or  . thiamine  100 mg Intravenous Daily  . vancomycin  750 mg Intravenous Q12H   Continuous Infusions: . dextrose 5 % and 0.9 % NaCl with KCl 40 mEq/L 100 mL/hr at 10/27/15 1900   PRN Meds:.haloperidol lactate, LORazepam, morphine injection, ondansetron **OR** ondansetron (ZOFRAN) IV Assessment/Plan:  Altered Mental Status: Likely multifactorial in setting of sepsis, polysubstance abuse, mental illness. Remains intermittently agitated but more calm today on ativan and haldol PRN. - Haldol IV 5 mg q6h prn, resume Seroquel at 200 mg qhs with gradual uptitration once stabilized - CIWA Protocol - q2h neuro check - Thiamine, folate - D5 NS + KCl at 100 cc/hr  Right Foot Gangrene: Foot remains cool. Patient continues to be unable to consent for right foot amputations. He is now 48 hours off Xarelto but limited by mental status. No fevers. On antibiotics. We appreciate vascular surgery for following this patient. - Vanc and Zosyn IV per pharm - Holding Xarelto - NPO - PT/OT eval once able to participate  Elevated AST, ALT mild: Could be related alcohol abuse (although denies) and/or elevated CK 2/2 ischemic foot. ALT remains elevated 2/1 ratio.  - follow for now.   Normocytic Anemia: -hgb stable after 1 unit tx since admission.   T2DM:  On home insulin.- SSI  HTN: BP trending 130s-160/70s-80s.  - Holding home atenolol, HCTZ, Clonidine, Lisinopril as NPO.  - Continue assessing, especially for rebound HTN  DVT PPx: Heparin [place]  Dispo: Disposition is deferred at this time, awaiting  improvement of current medical problems.    The patient does have a current PCP (Pcp Not In System) and does not need an [facility] hospital follow-up appointment after discharge.  The patient does have transportation limitations that hinder transportation to clinic appointments.  .Services Needed at time of discharge: Y = Yes, Blank = No PT:   OT:   RN:   Equipment:   Other:     LOS: 3 days   [name] Meeker, MD 10/28/2015, 10:17 AM

## 2015-10-28 NOTE — Progress Notes (Signed)
Patient took remaining oral medications for my shift with applesauce and water.  Patient chewed the medications rather than swallowing them whole.

## 2015-10-28 NOTE — Progress Notes (Signed)
   Daily Progress Note  Assessment/Planning: R foot ischemia, AMS   Still not able to hold a conversation  Dr. Darrick PennaFields will be by tomorrow  R AKA on hold until his AMS improves  Subjective    Somulent, intermittently lucid  Objective Filed Vitals:   10/27/15 2321 10/28/15 0352 10/28/15 0819 10/28/15 0825  BP: 162/87 158/92  171/87  Pulse: 89 101  111  Temp: 98.1 F (36.7 C) 98.3 F (36.8 C) 98.3 F (36.8 C)   TempSrc: Oral Oral    Resp: 25 29    Height:      Weight:      SpO2: 98% 98% 96%     Intake/Output Summary (Last 24 hours) at 10/28/15 0920 Last data filed at 10/28/15 [id number]  Gross per 24 hour  Intake   1100 ml  Output   2075 ml  Net   -975 ml    VASC  R foot skin slough evident  Laboratory CBC    Component Value Date/Time   WBC 9.9 10/28/2015 0623   HGB 7.4* 10/28/2015 0623   HCT 24.5* 10/28/2015 0623   PLT 486* 10/28/2015 0623    BMET    Component Value Date/Time   NA 144 10/28/2015 0623   K 4.6 10/28/2015 0623   CL 115* 10/28/2015 0623   CO2 20* 10/28/2015 0623   GLUCOSE 183* 10/28/2015 0623   BUN 7 10/28/2015 0623   CREATININE 1.14 10/28/2015 0623   CALCIUM 8.9 10/28/2015 0623   GFRNONAA >60 10/28/2015 0623   GFRAA >60 10/28/2015 [id number]    Leonides SakeBrian Chen, MD [facility] Office: [phone] Pager: [phone]  10/28/2015, 9:20 AM

## 2015-10-28 NOTE — Progress Notes (Signed)
SLP Cancellation Note  Patient Details Name: [name] LauthRobert [name] MRN: [id number] DOB: 1965-09-11   Cancelled treatment:       Reason Eval/Treat Not Completed: Fatigue/lethargy limiting ability to participate, recently given sedating medications from RN. Will recheck as schedule allows.   [name] HamLaura [name], M.A. CCC-SLP [phone]  [name] Hamaiewonsky, [name] [name] 10/28/2015, 8:44 AM
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
## 2015-10-29 ENCOUNTER — Ambulatory Visit: Payer: Self-pay | Admitting: Family

## 2015-10-29 DIAGNOSIS — E43 Unspecified severe protein-calorie malnutrition: Secondary | ICD-10-CM | POA: Insufficient documentation

## 2015-10-29 DIAGNOSIS — I998 Other disorder of circulatory system: Secondary | ICD-10-CM | POA: Insufficient documentation

## 2015-10-29 LAB — BASIC METABOLIC PANEL
Anion gap: 8 (ref 5–15)
BUN: 7 mg/dL (ref 6–20)
CO2: 20 mmol/L — AB (ref 22–32)
Calcium: 8.7 mg/dL — ABNORMAL LOW (ref 8.9–10.3)
Chloride: 117 mmol/L — ABNORMAL HIGH (ref 101–111)
Creatinine, Ser: 1.2 mg/dL (ref 0.61–1.24)
GFR calc Af Amer: >60 mL/min (ref >60)
GFR calc non Af Amer: >60 mL/min (ref >60)
GLUCOSE: 160 mg/dL — AB (ref 65–99)
POTASSIUM: 4.4 mmol/L (ref 3.5–5.1)
Sodium: 145 mmol/L (ref 135–145)

## 2015-10-29 LAB — GLUCOSE, CAPILLARY
GLUCOSE-CAPILLARY: 167 mg/dL — AB (ref 65–99)
GLUCOSE-CAPILLARY: 158 mg/dL — AB (ref 65–99)
Glucose-Capillary: 147 mg/dL — ABNORMAL HIGH (ref 65–99)
Glucose-Capillary: 189 mg/dL — ABNORMAL HIGH (ref 65–99)
Glucose-Capillary: 180 mg/dL — ABNORMAL HIGH (ref 65–99)
Glucose-Capillary: 145 mg/dL — ABNORMAL HIGH (ref 65–99)

## 2015-10-29 MED ORDER — ATENOLOL 50 MG PO TABS
50.0000 mg | ORAL_TABLET | Freq: Two times a day (BID) | ORAL | Status: DC
  Administered 2015-10-29 – 2015-11-04 (×12): 50 mg via ORAL
  Filled 2015-10-29 (×12): qty 1

## 2015-10-29 MED ORDER — CLONIDINE HCL 0.1 MG/24HR TD PTWK
0.1000 mg | MEDICATED_PATCH | TRANSDERMAL | Status: DC
  Administered 2015-10-29: 0.1 mg via TRANSDERMAL
  Filled 2015-10-29: qty 1

## 2015-10-29 MED ORDER — DEXTROSE-NACL 5-0.9 % IV SOLN
INTRAVENOUS | Status: DC
Start: 2015-10-29 — End: 2015-10-29
  Filled 2015-10-29 (×2): qty 1000

## 2015-10-29 MED ORDER — DEXTROSE-NACL 5-0.9 % IV SOLN
INTRAVENOUS | Status: AC
  Administered 2015-10-29: 18:00:00 via INTRAVENOUS

## 2015-10-29 MED ORDER — HYDRALAZINE HCL 20 MG/ML IJ SOLN
5.0000 mg | INTRAMUSCULAR | Status: DC | PRN
  Administered 2015-10-29: 5 mg via INTRAVENOUS
  Filled 2015-10-29: qty 1

## 2015-10-29 MED ORDER — CLONIDINE HCL 0.1 MG PO TABS
0.1000 mg | ORAL_TABLET | Freq: Two times a day (BID) | ORAL | Status: DC
  Administered 2015-10-29: 0.1 mg via ORAL
  Filled 2015-10-29: qty 1

## 2015-10-29 MED ORDER — HYDROCHLOROTHIAZIDE 25 MG PO TABS
25.0000 mg | ORAL_TABLET | Freq: Every day | ORAL | Status: DC
  Administered 2015-10-29 – 2015-11-04 (×7): 25 mg via ORAL
  Filled 2015-10-29 (×7): qty 1

## 2015-10-29 MED ORDER — HYDRALAZINE HCL 20 MG/ML IJ SOLN
10.0000 mg | INTRAMUSCULAR | Status: DC | PRN
  Administered 2015-10-29 – 2015-10-30 (×3): 10 mg via INTRAVENOUS
  Filled 2015-10-29 (×3): qty 1

## 2015-10-29 NOTE — Progress Notes (Signed)
Physical Therapy Treatment Patient Details Name: [name] [name] MRN: [id number] DOB: 03-Jan-1966 Today's Date: 10/29/2015    History of Present Illness 50 y.o. male [facility] 10/25/15 with right foot pain and AMS.  Pt dx with multifactorial AMS in the setting of sepsis, polysubstance abuse, and mental illness.  R foot is gangrenous planned R foot amputation (mother has concented) possibly 10/29/15, and normocytic anemaia (hgb stable after transfusing 1 unit PRBCs).  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     PT Comments    Pt was able to sit EOB for a bit during today's session.  He remains quite confused and disoriented.  He has a hard time initiating movement, but was fairly cooperative during our time EOB.  PT will continue to follow acutely.    Follow Up Recommendations  SNF     Equipment Recommendations  None recommended by PT    Recommendations for Other Services   NA     Precautions / Restrictions Precautions Precautions: Fall Precaution Comments: Lt AKA, no prosthesis    Mobility  Bed Mobility Overal bed mobility: Needs Assistance;+2 for physical assistance Bed Mobility: Supine to Sit;Sit to Supine Rolling: Mod assist;+2 for physical assistance   Supine to sit: +2 for physical assistance;Mod assist Sit to supine: Min assist   General bed mobility comments: Two person mod assist to get to sitting EOB.  Pt with very poor initiation of movement.  PT helping with right leg and to help pt reach for bed rail.  Pt will not maintain grip on bedrail, but will on therapist's hand.  Assist also needed to boost trunk up to sitting.  Pt decided he wanted to lay back down and he did so with only min assist to help lift his right leg.    Transfers                 General transfer comment: Not safe to attempt at this time.           Balance Overall balance assessment: Needs assistance Sitting-balance support: Bilateral upper extremity supported Sitting balance-Leahy Scale: Fair Sitting balance - Comments: Pt is able to sit with supervision EOB and bil arms supported.  He is unable to release either arm as he will lose his balance posteriorly.  Sat EOB ~10 mins to try to keep pt alert and re-orient him to his current situation.  Postural control: Posterior lean                          Cognition Arousal/Alertness: Lethargic Behavior During Therapy: Restless Overall Cognitive Status: Impaired/Different from baseline Area of Impairment: Orientation;Attention;Memory;Following commands;Safety/judgement;Awareness;Problem solving Orientation Level: Disoriented to;Place;Time;Situation Current Attention Level: Focused Memory: Decreased short-term memory Following Commands: Follows one step commands inconsistently;Follows one step commands with increased time Safety/Judgement: Decreased awareness of safety;Decreased awareness of deficits Awareness: Intellectual Problem Solving: Slow processing;Decreased initiation;Difficulty sequencing;Requires verbal cues;Requires tactile cues General Comments: Pt shouting out to children who are not in the room, unable to state that he is in the hospital or even try to guess the month.  He mumbles quite a bit during the session and gets frustrated when I can't understand him.            Pertinent Vitals/Pain Pain Assessment: Faces Faces Pain Scale: Hurts even more Pain Location:  with mobilizing right leg Pain Descriptors / Indicators: Grimacing;Guarding Pain Intervention(s): Limited activity within patient's tolerance;Monitored during session;Repositioned           PT Goals (current goals can now be found in the care plan section) Acute Rehab PT Goals Patient Stated Goal: unable to state at this time. Progress towards PT goals: Not progressing toward goals  - comment (due to increased confusion)    Frequency  Min 3X/week    PT Plan Current plan remains appropriate       End of Session   Activity Tolerance: Patient limited by fatigue;Patient limited by lethargy Patient left: in bed;with call bell/phone within reach;with chair alarm set;with restraints reapplied     Time: 1531-1543 PT Time Calculation (min) (ACUTE ONLY): 12 min  Charges:  $Therapeutic Activity: 8-22 mins                      [name] [name], PT, DPT [phone]   10/29/2015, 4:16 PM

## 2015-10-29 NOTE — Progress Notes (Signed)
Subjective: Patient seen and examined this morning with girlfriend in the room.  Apparently, patients mother has given consent to surgery if needed.  VVS following along, too.  He remains somnolent but arouses to voice and responds to his name.  He received  Haldol last PM around 6:45 plus  Ativan this AM around midnight.  Blood pressures are elevated, afebrile, blood cultures NG.   Objective: Vital signs in last 24 hours: Filed Vitals:   10/29/15 0751 10/29/15 0802 10/29/15 0824 10/29/15 1019  BP: 184/112 177/103 182/108 182/105  Pulse: 98 97 98 93  Temp: 98.4 F (36.9 C) 98.4 F (36.9 C)    TempSrc: Axillary Axillary    Resp:   29 26  Height:      Weight:      SpO2: 97% 97% 96% 99%   Weight change:   Intake/Output Summary (Last 24 hours) at 10/29/15 1047 Last data filed at 10/29/15 1019  Gross per 24 hour  Intake 2581.66 ml  Output   2250 ml  Net 331.66 ml   Physical Exam General: Lying in bed, sleeping though intermittently opening his eyes, not agitated  HEENT: Atraumatic, No scleral icuterus Cardiovascular: RRR, no m/r/g Pulmonary: breaths non-labored, snoring Abdominal: Soft,  umbilical hernia Extremities: Left AKA. Right foot cool to touch at the ankle, and warm more cephalad. Sloughing on dorsum of foot Neurological: Unable to follow commands. Cannot assess alertness and orientation.   Lab Results: Basic Metabolic Panel:  Recent Labs Lab 10/28/15 0623 10/29/15 0852  NA 144 145  K 4.6 4.4  CL 115* 117*  CO2 20* 20*  GLUCOSE 183* 160*  BUN 7 7  CREATININE 1.14 1.20  CALCIUM 8.9 8.7*   Liver Function Tests:  Recent Labs Lab 10/27/15 0430 10/28/15 0623  AST 41 51*  ALT 84* 92*  ALKPHOS 106 108  BILITOT 0.5 0.4  PROT 7.8 7.2  ALBUMIN 2.1* 2.0*    Recent Labs Lab 10/25/15 0813  AMMONIA 24   CBC:  Recent Labs Lab 10/25/15 0655 10/25/15 1454  10/27/15 0430 10/28/15 0623  WBC 11.6* 12.0*  < > 9.9 9.9  NEUTROABS 9.3* 9.2*  --    --   --   HGB 6.0* 7.4*  < > 7.2* 7.4*  HCT 19.8* 23.7*  < > 24.0* 24.5*  MCV 79.8 79.8  < > 81.6 83.3  PLT 668* 553*  < > 556* 486*  < > = values in this interval not displayed. Cardiac Enzymes:  Recent Labs Lab 10/25/15 0655 10/25/15 1454  CKTOTAL 1119*  --   TROPONINI <0.03 <0.03   CBG:  Recent Labs Lab 10/28/15 1136 10/28/15 1620 10/28/15 1942 10/28/15 2319 10/29/15 0318 10/29/15 0807  GLUCAP 164* 165* 144* 181* 167* 147*   Anemia Panel:  Recent Labs Lab 10/25/15 0821  VITAMINB12 1127*  FOLATE 31.0  FERRITIN 212  TIBC 251  IRON 22*  RETICCTPCT 4.4*   Urine Drug Screen: Drugs of Abuse     Component Value Date/Time   LABOPIA POSITIVE* 10/25/2015 0800   COCAINSCRNUR POSITIVE* 10/25/2015 0800   LABBENZ POSITIVE* 10/25/2015 0800   AMPHETMU NONE DETECTED 10/25/2015 0800   THCU NONE DETECTED 10/25/2015 0800   LABBARB NONE DETECTED 10/25/2015 0800    Alcohol Level:  Recent Labs Lab 10/25/15 0655  ETH <5   Urinalysis:  Recent Labs Lab 10/25/15 0800  COLORURINE YELLOW  LABSPEC 1.019  PHURINE 5.5  GLUCOSEU NEGATIVE  HGBUR NEGATIVE  BILIRUBINUR NEGATIVE  KETONESUR NEGATIVE  PROTEINUR NEGATIVE  NITRITE NEGATIVE  LEUKOCYTESUR NEGATIVE    Studies/Results: No results found. Medications: I have reviewed the patient's current medications. Scheduled Meds: . aspirin EC  81 mg Oral Daily  . atorvastatin  40 mg Oral q1800  . buPROPion  100 mg Oral BID  . cloNIDine  0.1 mg Transdermal Weekly  . docusate  50 mg Oral Daily  . folic acid  1 mg Oral Daily  . heparin subcutaneous  5,000 Units Subcutaneous Q8H  . hydrochlorothiazide  25 mg Oral Daily  . insulin aspart  0-9 Units Subcutaneous Q4H  . multivitamin with minerals  1 tablet Oral Daily  . piperacillin-tazobactam (ZOSYN)  IV  3.375 g Intravenous Q8H  . QUEtiapine  400 mg Oral QHS  . sodium chloride flush  3 mL Intravenous Q12H  . thiamine  100 mg Oral Daily   Or  . thiamine  100 mg  Intravenous Daily  . vancomycin  750 mg Intravenous Q12H   Continuous Infusions: . dextrose 5 % and 0.9 % NaCl with KCl 40 mEq/L 100 mL/hr at 10/29/15 0559   PRN Meds:.haloperidol lactate, LORazepam, morphine injection, ondansetron **OR** ondansetron (ZOFRAN) IV Assessment/Plan:  Altered Mental Status: Likely multifactorial in setting of sepsis, polysubstance abuse, mental illness. Remains intermittently agitated but more calm today on ativan and haldol PRN. - Continue Haldol IV 5 mg q6h prn - Continue Seroquel 400 mg qhs with gradual uptitration once stabilized.  Home dose is 800mg  qhs - Continue Wellbutrin 100mg  BID - CIWA Protocol - q2h neuro check - Thiamine, folate - D5 NS + KCl at 100 cc/hr --> checking BMET this morning to assess K+  Right Foot Gangrene: Foot remains cool. Patient continues to be unable to consent for right foot amputations but mother has consented if needed. He is now  off Xarelto since 4/27.  No fevers. On antibiotics. We appreciate vascular surgery for following this patient. - f/u vascular surgery recommendations for possible surgery - Vanc and Zosyn IV per pharm - Holding Xarelto - NPO - PT/OT eval once able to participate  Elevated AST, ALT mild: Could be related alcohol abuse (although denies) and/or elevated CK 2/2 ischemic foot. ALT remains elevated 2/1 ratio.  - follow for now.  - check CK in morning  Normocytic Anemia: -hgb stable after 1 unit tx since admission. - follow CBC   T2DM:  On home insulin.- SSI  HTN: BP trending 160s-170s.  - We have been holding home atenolol, HCTZ, Clonidine, Lisinopril as NPO.  - Will restart clonidine with patch, also will add back HCTZ - Follow BP  DVT PPx: Heparin Pisek  Dispo: Disposition is deferred at this time, awaiting improvement of current medical problems.    The patient does have a current PCP (Pcp Not In System) and does not [facility] after discharge.  The patient  does have transportation limitations that hinder transportation to clinic appointments.  .Services Needed at time of discharge: Y = Yes, Blank = No PT:   OT:   RN:   Equipment:   Other:     LOS: 4 days   Gwynn BurlyAndrew Zakhia Seres, DO 10/29/2015, 10:47 AM

## 2015-10-29 NOTE — Clinical Social Work Note (Signed)
CSW waiting for MD to sign 30-day note for PASRR number. MD has been notified by RN that the letter is in the patient's chart.  Charlynn CourtSarah Lailana Shira, CSW [phone]

## 2015-10-29 NOTE — Progress Notes (Signed)
SLP Cancellation Note  Patient Details Name: [name] LauthRobert [name] MRN: [id number] DOB: 1966/03/18   Cancelled treatment:       Reason Eval/Treat Not Completed: Medical issues which prohibited therapy. Per chart review, note that pt is NPO for possible amputation. Will f/u as able.   [name] [name], M.A. CCC-SLP [phone]  [name] Hamaiewonsky, [name] 10/29/2015, 8:49 AM

## 2015-10-29 NOTE — Progress Notes (Signed)
Initial Nutrition Assessment  DOCUMENTATION CODES:   Severe malnutrition in context of acute illness/injury  INTERVENTION:  -Monitor for diet advancement and nutritional needs.   NUTRITION DIAGNOSIS:   Inadequate oral intake related to lethargy/confusion as evidenced by per patient/family report.  GOAL:   Patient will meet greater than or equal to 90% of their needs  MONITOR:   Diet advancement, Labs, Weight trends, Skin, I & O's  REASON FOR ASSESSMENT:   Low Braden    ASSESSMENT:   50 y.o. 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 Patient unable to provide history, therefore most is obtained from his girlfriend at the bed side. She states that the patient has been acting abnormally for the past 2 days and has gotten progressively worse. The patient takes Seroquel and per the girlfriend, he has not taken this for the past 5-6 days because there was an issue with his prescription refill. She also states the patient has been complaining of severe, worsening right foot pain that has cause him an enormous amount of discomfort resulting in crying spells and outbursts. She states that normally the patient is able to feed himself, bathe himself, and carry on a normal conversation, however, this has not been the case for the past 2-3 days. The patient denies any complaints of recent fever, chills, nausea, vomiting, diarrhea, difficulty urinating, dizziness, or lightheadedness.   Pt seen  for low braden. Pt asleep and unresponsive at time of visit. Talked to pt's girlfriend. She reports pt eating well, TID with snacks, until 3 days PTA. Pt's appetite was fine and he had no N/V associated with eating. UBW is 200-220 lbs. Pt currently weighs 177 lbs which is a 16.5% decrease from weight on 10/18/15. This is significant for time frame. Pt girlfriend denies weight loss over the past year. She states that his clothes do not fit any looser.   Pt's girlfriend requests education for diabetic diet education prior to discharge. She states that pt was scared to eat after last surgery because he did not know what he should be eating. Girlfriend reports changing to sugar free drinks and snack items. Girlfriend and pt need more education for a diabetic diet. Will wait for pt to be more responsive to give both pt and girlfriend education. Will continue to monitor diet advancement and provide supplements as needed.   NFPE: No fat depletion, mild muscle depletion, no edema. Mild muscle depletion only found in leg. Suspect d/t less use since amputation 08/20/2014.   Labs reviewed; CBGs 144-189, Cl 117, Ca 8.7  Meds reviewed; Colace, folic acid 1 mg, MVI w/ minerals, thiamine 100 mg, KCl w/ NS @ 40 mEq/L.    Diet Order:  DIET DYS 2 Room service appropriate?: Yes; Fluid consistency:: Thin  Skin:  Wound (see comment) (Incision on R axilla and R leg )  Last BM:  4/29  Height:   Ht Readings from Last 1 Encounters:  10/25/15 6\' 2"  (1.88 m)    Weight:   Wt Readings from Last 1 Encounters:  10/25/15 177 lb 14.6 oz (80.7 kg)  Ideal Body Weight:  79.5 kg  BMI:  24.8 kg/m^2  Estimated Nutritional Needs:   Kcal:  2100-2300 (26-29 kcals/kg)  Protein:  110-120 (1.5 g/kg)  Fluid:  1.6-1.8 L  EDUCATION NEEDS:   Education needs no appropriate at this time  Beryle Quant, MS N[facility] Dietetic Intern Pager [phone]

## 2015-10-29 NOTE — Clinical Documentation Improvement (Signed)
Internal Medicine  Can the diagnosis of sepsis be further specified? Please document in the progress notes if sepsis was present on admission.    Sepsis - specify causative organism if known  Determine if there is Severe Sepsis (Sepsis with organ dysfunction - specify), Septic Shock if present  Identify etiology of Sepsis - Device, Implant, Graft, Infusion, Abortion  Specify organ dysfunction - Respiratory Failure, Encephalopathy, Acute Kidney Failure, Pneumonia, UTI, Other (specify), Unable to Clinically Determine}  Other  Clinically Undetermined  Document any associated diagnoses/conditions.   Supporting Information: AMS, likely multifactorial in the setting of sepsis, polysubstance abuse, and mental illness per 5/01 progress notes.   Please exercise your independent, professional judgment when responding. A specific answer is not anticipated or expected.   Thank Sabino DonovanYou,  Chalet Kerwin [facility] [place] [phone]

## 2015-10-29 NOTE — Care Management Note (Addendum)
Case Management Note  Patient Details  Name: Leida LauthRobert Leja MRN: [id number] Date of Birth: 06/25/1966  Subjective/Objective:   Patient with acute encephalopathy, has hx of bipolar as well, psa,  on ciwa protocol, bp up, on iv htn meds, iv abx, and ivf's Vascular following for ischemic foot. Per Vascular would like to do surgery when AMS improves so patient can make decision as to whether he wants to have surgery because previously he stated he did not want surgery.  Plan is for snf when medically stable. CSW and NCM following.                 Action/Plan:   Expected Discharge Date:                  Expected Discharge Plan:  Skilled Nursing Facility  In-House Referral:  Clinical Social Work  Discharge planning Services  CM Consult  Post Acute Care Choice:    Choice offered to:     DME Arranged:    DME Agency:     HH Arranged:    HH Agency:     Status of Service:  In process, will continue to follow  Medicare Important Message Given:    Date Medicare IM Given:    Medicare IM give by:    Date Additional Medicare IM Given:    Additional Medicare Important Message give by:     If discussed at Long Length of Stay Meetings, dates discussed:    Additional Comments:  Leone Havenaylor, Muna Demers Clinton, RN 10/29/2015, 6:23 PM

## 2015-10-29 NOTE — Progress Notes (Signed)
10/29/15 1024  Clinical Encounter Type  Visited With Other (Comment) (Girlfriend)  Visit Type Initial;Psychological support;Social support  Referral From Physician  Spiritual Encounters  Spiritual Needs Emotional  Stress Factors  Family Stress Factors Exhausted;Health changes;Lack of caregivers   Chaplain met with patient's girlfriend, Lynette. Girlfriend is feeling a bit overwhelmed, having taken on a primary caregiver role for the patient and feeling like she's not getting much support in caring for him. Chaplain offered emotional support through offering empathic listening and facilitating a life review. Chaplain services available as needed.    M , Chaplain 10/29/2015 10:26 AM  

## 2015-10-29 NOTE — Progress Notes (Signed)
   10/29/15 0824  Vitals  BP (!) 182/108 mmHg  MAP (mmHg) 129  Pulse Rate 98  ECG Heart Rate 99  Resp (!) 29  Oxygen Therapy  SpO2 96 %  notifed dr wallace regarding high bp, waiting on new orders, will continue to monitor.

## 2015-10-29 NOTE — Progress Notes (Addendum)
  Progress Note    10/29/2015 7:41 AM Hospital Day 4  Subjective:  sleeping  Afebrile 160's-180's systolic HR  70's-100's NSR/ST 97% RA  Filed Vitals:   10/29/15 0400 10/29/15 0500  BP: 166/101 161/99  Pulse: 102 99  Temp:    Resp:      Physical Exam: Extremities:  Right foot is ischemic; easily palpable right ax fem bypass graft.  CBC    Component Value Date/Time   WBC 9.9 10/28/2015 0623   RBC 2.94* 10/28/2015 0623   RBC 2.53* 10/25/2015 0821   HGB 7.4* 10/28/2015 0623   HCT 24.5* 10/28/2015 0623   PLT 486* 10/28/2015 0623   MCV 83.3 10/28/2015 0623   MCH 25.2* 10/28/2015 0623   MCHC 30.2 10/28/2015 0623   RDW 17.3* 10/28/2015 0623   LYMPHSABS 1.7 10/25/2015 1454   MONOABS 0.8 10/25/2015 1454   EOSABS 0.2 10/25/2015 1454   BASOSABS 0.0 10/25/2015 1454    BMET    Component Value Date/Time   NA 144 10/28/2015 0623   K 4.6 10/28/2015 0623   CL 115* 10/28/2015 0623   CO2 20* 10/28/2015 0623   GLUCOSE 183* 10/28/2015 0623   BUN 7 10/28/2015 0623   CREATININE 1.14 10/28/2015 0623   CALCIUM 8.9 10/28/2015 0623   GFRNONAA >60 10/28/2015 0623   GFRAA >60 10/28/2015 0623    INR    Component Value Date/Time   INR 1.39 10/26/2015 1439     Intake/Output Summary (Last 24 hours) at 10/29/15 0741 Last data filed at 10/29/15 0321  Gross per 24 hour  Intake   1200 ml  Output   2275 ml  Net  -1075 ml     Assessment/Plan:  50 y.o. male is here with ischemic right foot and AMS Hospital Day 4  -per his girlfriend, his AMS has somewhat improved.  He is eating and has started taking his medication. -he will need amputation-will keep npo in case it can be done today.  Will d/w MD.   -he has not received Xarelto since 4/27.  He is on SQ heparin for DVT prophylaxis.  -hypertensive-may benefit from restarting home antihypertensive medications    Doreatha MassedSamantha Rhyne, PA-C [facility] [phone] 10/29/2015 7:41 AM   Pt still confused.  Has  known underlying psychiatric disorder.  Currently no leukocytosis or fever to suggest this is underlying sepsis so would prefer for delirium to clear prior to amputation.  This is currently not emergent and would prefer pt to participate in the decision making process if at all possible since he previously refused amputation.  If he develops signs of sepsis from his foot will proceed more urgent/emergently  Fabienne Brunsharles Fields, MD [facility] [place] Office: [phone] Pager: [phone]

## 2015-10-30 LAB — COMPREHENSIVE METABOLIC PANEL
ALBUMIN: 2.1 g/dL — AB (ref 3.5–5.0)
ALK PHOS: 96 U/L (ref 38–126)
ALT: 93 U/L — ABNORMAL HIGH (ref 17–63)
AST: 48 U/L — ABNORMAL HIGH (ref 15–41)
Anion gap: 12 (ref 5–15)
BILIRUBIN TOTAL: 0.4 mg/dL (ref 0.3–1.2)
BUN: 6 mg/dL (ref 6–20)
CALCIUM: 9.1 mg/dL (ref 8.9–10.3)
CO2: 19 mmol/L — ABNORMAL LOW (ref 22–32)
Chloride: 113 mmol/L — ABNORMAL HIGH (ref 101–111)
Creatinine, Ser: 1.2 mg/dL (ref 0.61–1.24)
GFR calc non Af Amer: >60 mL/min (ref >60)
Glucose, Bld: 144 mg/dL — ABNORMAL HIGH (ref 65–99)
POTASSIUM: 4 mmol/L (ref 3.5–5.1)
Sodium: 144 mmol/L (ref 135–145)
TOTAL PROTEIN: 7.3 g/dL (ref 6.5–8.1)

## 2015-10-30 LAB — CULTURE, BLOOD (ROUTINE X 2)
CULTURE: NO GROWTH
CULTURE: NO GROWTH

## 2015-10-30 LAB — GLUCOSE, CAPILLARY
GLUCOSE-CAPILLARY: 166 mg/dL — AB (ref 65–99)
GLUCOSE-CAPILLARY: 141 mg/dL — AB (ref 65–99)
GLUCOSE-CAPILLARY: 140 mg/dL — AB (ref 65–99)
GLUCOSE-CAPILLARY: 162 mg/dL — AB (ref 65–99)
GLUCOSE-CAPILLARY: 155 mg/dL — AB (ref 65–99)
Glucose-Capillary: 203 mg/dL — ABNORMAL HIGH (ref 65–99)
Glucose-Capillary: 146 mg/dL — ABNORMAL HIGH (ref 65–99)

## 2015-10-30 LAB — CBC
HCT: 25.6 % — ABNORMAL LOW (ref 39.0–52.0)
HEMOGLOBIN: 7.6 g/dL — AB (ref 13.0–17.0)
MCH: 24.6 pg — ABNORMAL LOW (ref 26.0–34.0)
MCHC: 29.7 g/dL — ABNORMAL LOW (ref 30.0–36.0)
MCV: 82.8 fL (ref 78.0–100.0)
PLATELETS: 370 10*3/uL (ref 150–400)
RBC: 3.09 MIL/uL — AB (ref 4.22–5.81)
RDW: 17.3 % — ABNORMAL HIGH (ref 11.5–15.5)
WBC: 8 10*3/uL (ref 4.0–10.5)

## 2015-10-30 LAB — CK: CK TOTAL: 475 U/L — AB (ref 49–397)

## 2015-10-30 LAB — VANCOMYCIN, TROUGH: VANCOMYCIN TR: 13 ug/mL (ref 10.0–20.0)

## 2015-10-30 LAB — PREPARE RBC (CROSSMATCH)

## 2015-10-30 MED ORDER — QUETIAPINE FUMARATE ER 300 MG PO TB24
600.0000 mg | ORAL_TABLET | Freq: Every day | ORAL | Status: DC
  Administered 2015-10-30 – 2015-11-01 (×3): 600 mg via ORAL
  Filled 2015-10-30 (×4): qty 2

## 2015-10-30 MED ORDER — SODIUM CHLORIDE 0.9 % IV SOLN: Freq: Once | INTRAVENOUS | Status: DC

## 2015-10-30 NOTE — Progress Notes (Signed)
Pharmacy Antibiotic Note  [name] LauthRobert [name] is a 50 y.o. male admitted on 10/25/2015 with cellulitis.  Pharmacy has been consulted to manage vancomycin and Zosyn.  Patient's SCr is steadily rising, but his UOP remains good.  Vancomycin trough is therapeutic.  Plan: - Continue vanc 750mg  IV Q12H - Zosyn 3.375gm IV Q8H, 4 hr infusion - Monitor renal fxn, clinical progress, weekly vanc trough, abx LOT - F/U restart Xarelto post AKA, iron supplementation once infxn clears, watch vitals    Height: 6\' 2"  (188 cm) Weight: 177 lb 14.6 oz (80.7 kg) IBW/kg (Calculated) : 82.2  Temp (24hrs), Avg:98.3 F (36.8 C), Min:97.6 F (36.4 C), Max:99.2 F (37.3 C)   Recent Labs Lab 10/25/15 0651  10/25/15 1454 10/26/15 0938 10/26/15 1439 10/27/15 0430 10/27/15 0756 10/27/15 2032 10/28/15 0623 10/29/15 0852 10/30/15 0400 10/30/15 0953  WBC  --   < > 12.0* 12.5*  --  9.9  --   --  9.9  --  8.0  --   CREATININE  --   < >  --   --  1.20 1.23  --   --  1.14 1.20 1.20  --   LATICACIDVEN 1.19  --   --   --   --   --   --   --   --   --   --   --   VANCOTROUGH  --   --   --   --   --   --  26*  --   --   --   --  213  VANCORANDOM  --   --   --   --   --   --   --  13  --   --   --   --   < > = values in this interval not displayed.  Estimated Creatinine Clearance: 85 mL/min (by C-G formula based on Cr of 1.2).    No Known Allergies  Antimicrobials this admission: Vanc 4/27 >> Zosyn 4/27 >>  Dose adjustments this admission: 4/29 VT = 26 on 1g IV q8h >> hold and recheck 4/29 VT = 13 - q12h interval confirmed 5/2 VT = 13 mcg/mL on 750mg  q12 (SCr 1.2)  Microbiology results: 4/27 BCx x2 - negative   [name] [name], PharmD, BCPS Pager:  [phone] 10/30/2015, 11:36 AM

## 2015-10-30 NOTE — Progress Notes (Signed)
Occupational Therapy Treatment Patient Details Name: [name] LauthRobert [name] MRN: [id number] DOB: July 05, 1965 Today's Date: 10/30/2015    History of present illness 50 y.o. male [facility] with right foot pain and AMS.  Pt dx with multifactorial AMS in the setting of sepsis, polysubstance abuse, and mental illness.  R foot is gangrenous planned R foot amputation (mother has concented) possibly 10/29/15, and normocytic anemaia (hgb stable after transfusing 1 unit PRBCs).  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    OT comments  Pt demonstrates improving cognition - oriented to place and situation, demonstrates sustained attention for up to 15 seconds.  He tolerated EOB sitting, but not safe for OOB transfer at present.  Will continue to follow.   Follow Up Recommendations  SNF;Supervision/Assistance - 24 hour    Equipment Recommendations  None recommended by OT    Recommendations for Other Services      Precautions / Restrictions Precautions Precautions: Fall Precaution Comments: Lt AKA, no prosthesis       Mobility Bed Mobility Overal bed mobility: Needs Assistance Bed Mobility: Supine to Sit;Sit to Supine     Supine to sit: Mod assist;+2 for safety/equipment Sit to supine: Min assist   General bed mobility comments: Pt requires increased time and cues for technique.  Assist to lift trunk   Transfers                 General transfer comment: not safe to transfer this date     Balance Overall balance assessment: Needs assistance Sitting-balance support: Feet supported Sitting balance-Leahy Scale: Fair Sitting balance - Comments: Pt requires min guard assist for EOB sitting                            ADL Overall ADL's : Needs assistance/impaired     Grooming: Wash/dry hands;Wash/dry face;Min  Financial risk analystguard;Sitting                                        Vision                     Perception     Praxis      Cognition   Behavior During Therapy: Flat affect Overall Cognitive Status: Impaired/Different from baseline Area of Impairment: Orientation;Attention;Memory;Following commands;Safety/judgement;Awareness;Problem solving Orientation Level: Disoriented to;Time Current Attention Level: Sustained;Focused Memory: Decreased short-term memory  Following Commands: Follows one step commands consistently (with cues ) Safety/Judgement: Decreased awareness of safety;Decreased awareness of deficits   Problem Solving: Slow processing;Decreased initiation;Difficulty sequencing;Requires verbal cues;Requires tactile cues      Extremity/Trunk Assessment               Exercises Other Exercises Other Exercises: Pt performed 8 reps AROM and minimally resisted ROM for strengthening bil. UE.  He frequently loses attention requiring cues to continue    Shoulder Instructions       General Comments      Pertinent Vitals/ Pain       Pain Assessment: No/denies pain  Home Living  Prior Functioning/Environment              Frequency Min 2X/week     Progress Toward Goals  OT Goals(current goals can now be found in the care plan section)  Progress towards OT goals: Progressing toward goals (slowly )  ADL Goals Pt Will Perform Grooming: with supervision;sitting Pt Will Perform Upper Body Bathing: with supervision;sitting Pt Will Perform Lower Body Bathing: with supervision;sitting/lateral leans Pt Will Transfer to Toilet: with supervision;stand pivot transfer;bedside commode Pt Will Perform Toileting - Clothing Manipulation and hygiene: with supervision;sitting/lateral leans Additional ADL Goal #1: pt will complete bed mobility MOD I as precursor to adls  Plan Discharge plan remains appropriate     Co-evaluation                 End of Session     Activity Tolerance Patient limited by lethargy   Patient Left in bed;with call bell/phone within reach;with bed alarm set;with family/visitor present   Nurse Communication Mobility status        Time: 1610-9604 OT Time Calculation (min): 26 min  Charges: OT General Charges $OT Visit: 1 Procedure OT Treatments $Therapeutic Activity: 23-37 mins  Nolton Denis M 10/30/2015, 1:15 PM

## 2015-10-30 NOTE — Progress Notes (Signed)
Subjective: Patient seen and examined this morning on rounds.  He is more alert.  He knows his name and that he is having problems with his foot.  He states he would be willing to have amputation if it would save his life.  Has not required any Haldol and last received Ativan yesterday afternoon.   Objective: Vital signs in last 24 hours: Filed Vitals:   10/30/15 0400 10/30/15 0422 10/30/15 0521 10/30/15 0736  BP: 165/116  137/86 156/97  Pulse: 88  96 96  Temp:  97.8 F (36.6 C)  97.6 F (36.4 C)  TempSrc:  Axillary  Oral  Resp: Height:      Weight:      SpO2: 99%  100% 100%   Weight change:   Intake/Output Summary (Last 24 hours) at 10/30/15 1110 Last data filed at 10/30/15 1009  Gross per 24 hour  Intake 3281.66 ml  Output   2875 ml  Net 406.66 ml   Physical Exam General: Lying in bed, resting in bed, not distressed, able to answer questions HEENT: Atraumatic, No scleral icuterus Cardiovascular: RRR, no m/r/g Pulmonary: breaths non-labored Abdominal: Soft,  umbilical hernia Extremities: Left AKA. Right foot cool to touch at the ankle, and warm more cephalad. Sloughing on dorsum of foot Neurological: alert and oriented to person and event  Lab Results: Basic Metabolic Panel:  Recent Labs Lab 10/29/15 0852 10/30/15 0400  NA 145 144  K 4.4 4.0  CL 117* 113*  CO2 20* 19*  GLUCOSE 160* 144*  BUN 7 6  CREATININE 1.20 1.20  CALCIUM 8.7* 9.1   Liver Function Tests:  Recent Labs Lab 10/28/15 0623 10/30/15 0400  AST 51* 48*  ALT 92* 93*  ALKPHOS 108 96  BILITOT 0.4 0.4  PROT 7.2 7.3  ALBUMIN 2.0* 2.1*    Recent Labs Lab 10/25/15 0813  AMMONIA 24   CBC:  Recent Labs Lab 10/25/15 0655 10/25/15 1454  10/28/15 0623 10/30/15 0400  WBC 11.6* 12.0*  < > 9.9 8.0  NEUTROABS 9.3* 9.2*  --   --   --   HGB 6.0* 7.4*  < > 7.4* 7.6*  HCT 19.8* 23.7*  < > 24.5* 25.6*  MCV 79.8 79.8  < > 83.3 82.8  PLT 668* 553*  < > 486* 370  < > = values  in this interval not displayed. Cardiac Enzymes:  Recent Labs Lab 10/25/15 0655 10/25/15 1454 10/30/15 0400  CKTOTAL 1119*  --  475*  TROPONINI <0.03 <0.03  --    CBG:  Recent Labs Lab 10/29/15 1458 10/29/15 1632 10/29/15 1935 10/29/15 2302 10/30/15 0424 10/30/15 0738  GLUCAP 180* 158* 145* 166* 141* 140*   Anemia Panel:  Recent Labs Lab 10/25/15 0821  VITAMINB12 1127*  FOLATE 31.0  FERRITIN 212  TIBC 251  IRON 22*  RETICCTPCT 4.4*   Urine Drug Screen: Drugs of Abuse     Component Value Date/Time   LABOPIA POSITIVE* 10/25/2015 0800   COCAINSCRNUR POSITIVE* 10/25/2015 0800   LABBENZ POSITIVE* 10/25/2015 0800   AMPHETMU NONE DETECTED 10/25/2015 0800   THCU NONE DETECTED 10/25/2015 0800   LABBARB NONE DETECTED 10/25/2015 0800    Alcohol Level:  Recent Labs Lab 10/25/15 0655  ETH <5   Urinalysis:  Recent Labs Lab 10/25/15 0800  COLORURINE YELLOW  LABSPEC 1.019  PHURINE 5.5  GLUCOSEU NEGATIVE  HGBUR NEGATIVE  BILIRUBINUR NEGATIVE  KETONESUR NEGATIVE  PROTEINUR NEGATIVE  NITRITE NEGATIVE  LEUKOCYTESUR NEGATIVE  Studies/Results: No results found. Medications: I have reviewed the patient's current medications. Scheduled Meds: . aspirin EC  81 mg Oral Daily  . atenolol  50 mg Oral BID  . atorvastatin  40 mg Oral q1800  . buPROPion  100 mg Oral BID  . cloNIDine  0.1 mg Transdermal Weekly  . docusate  50 mg Oral Daily  . folic acid  1 mg Oral Daily  . heparin subcutaneous  5,000 Units Subcutaneous Q8H  . hydrochlorothiazide  25 mg Oral Daily  . insulin aspart  0-9 Units Subcutaneous Q4H  . multivitamin with minerals  1 tablet Oral Daily  . piperacillin-tazobactam (ZOSYN)  IV  3.375 g Intravenous Q8H  . QUEtiapine  400 mg Oral QHS  . sodium chloride flush  3 mL Intravenous Q12H  . thiamine  100 mg Oral Daily   Or  . thiamine  100 mg Intravenous Daily  . vancomycin  750 mg Intravenous Q12H   Continuous Infusions: . dextrose 5 % and  0.9% NaCl 100 mL/hr at 10/30/15 1009   PRN Meds:.haloperidol lactate, hydrALAZINE, LORazepam, morphine injection, ondansetron **OR** ondansetron (ZOFRAN) IV Assessment/Plan:  Altered Mental Status- improving: Likely multifactorial in setting of sepsis, polysubstance abuse, mental illness. Has not required medication for agitation, now outside of window for concern of DT's - will discontinue Haldol IV 2 mg q6h prn, can add back if needed - discontinue ativan, CIWA - titrate Seroquel from 400 to 600 mg qhs.  Home dose is 800mg  qhs and goal will be to get there - Continue Wellbutrin 100mg  BID - q2h neuro check - Thiamine, folate - D5 NS - consider neurology consult, repeat imaging if not more improved in next couple days  Right Foot Wet Gangrene: Foot remains cool. Patient has said this morning that he would be willing to have surgery. He is now  off Xarelto since 4/27.  No fevers. On antibiotics. We appreciate vascular surgery for following this patient. - f/u vascular surgery recommendations for possible surgery - Vanc and Zosyn IV per pharm - Holding Xarelto - dysphagia 2 diet - final blood cultures no growth - PT/OT eval once able to participate  Elevated AST, ALT mild, stable: Could be related alcohol abuse (although denies) and/or elevated CK 2/2 ischemic foot. ALT remains elevated 2/1 ratio.  - follow for now.  - CK elevated still but downtrended  Normocytic Anemia: -hgb stable after 1 unit tx since admission. - follow CBC   T2DM:  On home insulin.- SSI  HTN: BP trending 160s-170s last couple days, added back some of his anti-hypertensives yesterday - continue atenolol 50mg  BID, clonidine patch 0.1mg  Q7Days, HCTZ 25mg  daily - continue hydralazine 10mg  IV prn.  - Follow BP, creatine stable this AM  DVT PPx: Heparin [place]  Dispo: Disposition is deferred at this time, awaiting improvement of current medical problems.    The patient does have a current PCP (Pcp Not In System)  and does not [facility] after discharge.  The patient does have transportation limitations that hinder transportation to clinic appointments.  .Services Needed at time of discharge: Y = Yes, Blank = No PT:   OT:   RN:   Equipment:   Other:     LOS: 5 days   Gwynn BurlyAndrew Forest Pruden, DO 10/30/2015, 11:10 AM
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
## 2015-10-30 NOTE — Progress Notes (Addendum)
  [facility] Progress Note  Subjective  Having pain with right foot.   Objective Filed Vitals:   10/30/15 0422 10/30/15 0521  BP:  137/86  Pulse:  96  Temp: 97.8 F (36.6 C)   Resp:  23    Intake/Output Summary (Last 24 hours) at 10/30/15 0736 Last data filed at 10/30/15 0600  Gross per 24 hour  Intake 3238.32 ml  Output   2925 ml  Net 313.32 ml   Answers questions, but mumbles.  Palpable right ax-fem graft pulse Right foot with gangrenous toes and demarcation past midfoot.   Assessment/Planning: 50 y.o. male with ischemic right foot and altered mental status.  More lucid this am, but mumbling and unable to answer complete questions.  Tmax of 100 overnight, afebrile this am. No leukocytosis. No indications for urgent surgery.  Hopefully mental status will improve over next couple days for patient to consent to amputation as he has previously refused amputation.   Raymond GurneyKimberly A Trinh 10/30/2015 7:36 AM -- Alert and oriented x 3.  Family present.  Pt competent to make decisions currently.  He wishes AKA right leg.  Will proceed tomorrow  NPO p midnight Consent Type and screen.  Will most likely need transfusion since starting hemoglobin < 8  Fabienne Brunsharles Fields, MD [facility] [place] Office: [phone] Pager: [phone]  Laboratory CBC    Component Value Date/Time   WBC 8.0 10/30/2015 0400   HGB 7.6* 10/30/2015 0400   HCT 25.6* 10/30/2015 0400   PLT 370 10/30/2015 0400    BMET    Component Value Date/Time   NA 144 10/30/2015 0400   K 4.0 10/30/2015 0400   CL 113* 10/30/2015 0400   CO2 19* 10/30/2015 0400   GLUCOSE 144* 10/30/2015 0400   BUN 6 10/30/2015 0400   CREATININE 1.20 10/30/2015 0400   CALCIUM 9.1 10/30/2015 0400   GFRNONAA >60 10/30/2015 0400   GFRAA >60 10/30/2015 0400    COAG Lab Results  Component Value Date   INR 1.39 10/26/2015   INR 1.23 10/02/2015   INR 1.10 12/27/2014   No results  found for: PTT  Antibiotics Anti-infectives    Start     Dose/Rate Route Frequency Ordered Stop   10/27/15 2230  vancomycin (VANCOCIN) IVPB 750 mg/150 ml premix     750 mg 150 mL/hr over 60 Minutes Intravenous Every 12 hours 10/27/15 2203     10/25/15 1700  vancomycin (VANCOCIN) IVPB 1000 mg/200 mL premix  Status:  Discontinued     1,000 mg 200 mL/hr over 60 Minutes Intravenous Every 8 hours 10/25/15 0810 10/27/15 0925   10/25/15 1600  piperacillin-tazobactam (ZOSYN) IVPB 3.375 g     3.375 g 12.5 mL/hr over 240 Minutes Intravenous Every 8 hours 10/25/15 0810     10/25/15 0815  piperacillin-tazobactam (ZOSYN) IVPB 3.375 g     3.375 g 100 mL/hr over 30 Minutes Intravenous  Once 10/25/15 0810 10/25/15 0902   10/25/15 0815  vancomycin (VANCOCIN) 2,000 mg in sodium chloride 0.9 % 500 mL IVPB     2,000 mg 250 mL/hr over 120 Minutes Intravenous  Once 10/25/15 0810 10/25/15 1151       Maris BergerKimberly Trinh, PA-C [facility] Office: [phone] Pager: [phone] 10/30/2015 7:36 AM

## 2015-10-31 ENCOUNTER — Encounter (HOSPITAL_COMMUNITY)
Admission: EM | Disposition: A | Discharge status: Home Health | Payer: Self-pay | Source: Home / Self Care | Attending: Internal Medicine

## 2015-10-31 ENCOUNTER — Inpatient Hospital Stay (HOSPITAL_COMMUNITY): Payer: Medicaid Other | Admitting: Anesthesiology

## 2015-10-31 DIAGNOSIS — R7989 Other specified abnormal findings of blood chemistry: Secondary | ICD-10-CM

## 2015-10-31 DIAGNOSIS — I96 Gangrene, not elsewhere classified: Secondary | ICD-10-CM

## 2015-10-31 HISTORY — PX: AMPUTATION: SHX166

## 2015-10-31 LAB — GLUCOSE, CAPILLARY
GLUCOSE-CAPILLARY: 116 mg/dL — AB (ref 65–99)
Glucose-Capillary: 121 mg/dL — ABNORMAL HIGH (ref 65–99)
Glucose-Capillary: 178 mg/dL — ABNORMAL HIGH (ref 65–99)
Glucose-Capillary: 119 mg/dL — ABNORMAL HIGH (ref 65–99)
Glucose-Capillary: 168 mg/dL — ABNORMAL HIGH (ref 65–99)

## 2015-10-31 LAB — COMPREHENSIVE METABOLIC PANEL
ALK PHOS: 96 U/L (ref 38–126)
ALT: 90 U/L — AB (ref 17–63)
AST: 42 U/L — AB (ref 15–41)
Albumin: 2.3 g/dL — ABNORMAL LOW (ref 3.5–5.0)
Anion gap: 13 (ref 5–15)
BUN: 9 mg/dL (ref 6–20)
CO2: 21 mmol/L — ABNORMAL LOW (ref 22–32)
CREATININE: 1.33 mg/dL — AB (ref 0.61–1.24)
Calcium: 9.3 mg/dL (ref 8.9–10.3)
Chloride: 106 mmol/L (ref 101–111)
GFR calc non Af Amer: >60 mL/min (ref >60)
Glucose, Bld: 110 mg/dL — ABNORMAL HIGH (ref 65–99)
Potassium: 3.9 mmol/L (ref 3.5–5.1)
SODIUM: 140 mmol/L (ref 135–145)
TOTAL PROTEIN: 7.7 g/dL (ref 6.5–8.1)
Total Bilirubin: 0.4 mg/dL (ref 0.3–1.2)

## 2015-10-31 LAB — CBC
HCT: 24.9 % — ABNORMAL LOW (ref 39.0–52.0)
HEMOGLOBIN: 7.6 g/dL — AB (ref 13.0–17.0)
MCH: 24.7 pg — AB (ref 26.0–34.0)
MCHC: 30.5 g/dL (ref 30.0–36.0)
MCV: 80.8 fL (ref 78.0–100.0)
Platelets: 379 10*3/uL (ref 150–400)
RBC: 3.08 MIL/uL — AB (ref 4.22–5.81)
RDW: 16.9 % — ABNORMAL HIGH (ref 11.5–15.5)
WBC: 9.1 10*3/uL (ref 4.0–10.5)

## 2015-10-31 LAB — PREPARE RBC (CROSSMATCH)

## 2015-10-31 SURGERY — AMPUTATION, ABOVE KNEE
Anesthesia: General | Site: Leg Upper | Laterality: Right

## 2015-10-31 MED ORDER — PROPOFOL 10 MG/ML IV BOLUS
INTRAVENOUS | Status: AC
  Filled 2015-10-31: qty 20

## 2015-10-31 MED ORDER — MIDAZOLAM HCL 2 MG/2ML IJ SOLN
INTRAMUSCULAR | Status: AC
  Filled 2015-10-31: qty 2

## 2015-10-31 MED ORDER — ARTIFICIAL TEARS OP OINT
TOPICAL_OINTMENT | OPHTHALMIC | Status: AC
  Filled 2015-10-31: qty 3.5

## 2015-10-31 MED ORDER — LIDOCAINE HCL (CARDIAC) 20 MG/ML IV SOLN
INTRAVENOUS | Status: DC | PRN
  Administered 2015-10-31: 60 mg via INTRAVENOUS

## 2015-10-31 MED ORDER — OXYCODONE HCL 5 MG PO TABS
ORAL_TABLET | ORAL | Status: AC
  Filled 2015-10-31: qty 1

## 2015-10-31 MED ORDER — PHENYLEPHRINE HCL 10 MG/ML IJ SOLN
10.0000 mg | INTRAVENOUS | Status: DC | PRN
  Administered 2015-10-31: 20 ug/min via INTRAVENOUS

## 2015-10-31 MED ORDER — PROPOFOL 10 MG/ML IV BOLUS
INTRAVENOUS | Status: DC | PRN
  Administered 2015-10-31: 150 mg via INTRAVENOUS

## 2015-10-31 MED ORDER — HYDROMORPHONE HCL 1 MG/ML IJ SOLN
0.2500 mg | INTRAMUSCULAR | Status: DC | PRN
  Administered 2015-10-31 (×3): 0.5 mg via INTRAVENOUS

## 2015-10-31 MED ORDER — LIDOCAINE 2% (20 MG/ML) 5 ML SYRINGE
INTRAMUSCULAR | Status: AC
  Filled 2015-10-31: qty 5

## 2015-10-31 MED ORDER — FENTANYL CITRATE (PF) 250 MCG/5ML IJ SOLN
INTRAMUSCULAR | Status: AC
  Filled 2015-10-31: qty 5

## 2015-10-31 MED ORDER — MIDAZOLAM HCL 2 MG/2ML IJ SOLN
INTRAMUSCULAR | Status: DC | PRN
  Administered 2015-10-31 (×2): 1 mg via INTRAVENOUS

## 2015-10-31 MED ORDER — ONDANSETRON HCL 4 MG/2ML IJ SOLN
INTRAMUSCULAR | Status: AC
  Filled 2015-10-31: qty 2

## 2015-10-31 MED ORDER — SODIUM CHLORIDE 0.9 % IV SOLN
INTRAVENOUS | Status: DC | PRN
  Administered 2015-10-31: 15:00:00 via INTRAVENOUS

## 2015-10-31 MED ORDER — FENTANYL CITRATE (PF) 250 MCG/5ML IJ SOLN
INTRAMUSCULAR | Status: DC | PRN
  Administered 2015-10-31: 50 ug via INTRAVENOUS
  Administered 2015-10-31 (×2): 25 ug via INTRAVENOUS
  Administered 2015-10-31: 50 ug via INTRAVENOUS
  Administered 2015-10-31: 25 ug via INTRAVENOUS
  Administered 2015-10-31: 50 ug via INTRAVENOUS
  Administered 2015-10-31 (×2): 25 ug via INTRAVENOUS

## 2015-10-31 MED ORDER — HYDROMORPHONE HCL 1 MG/ML IJ SOLN
INTRAMUSCULAR | Status: AC
  Filled 2015-10-31: qty 1

## 2015-10-31 MED ORDER — OXYCODONE HCL 5 MG PO TABS
5.0000 mg | ORAL_TABLET | Freq: Once | ORAL | Status: AC | PRN
Start: 2015-10-31 — End: 2015-10-31
  Administered 2015-10-31: 5 mg via ORAL

## 2015-10-31 MED ORDER — ACETAMINOPHEN 325 MG PO TABS: 325.0000 mg | ORAL_TABLET | ORAL | Status: DC | PRN

## 2015-10-31 MED ORDER — HYDROMORPHONE HCL 1 MG/ML IJ SOLN
INTRAMUSCULAR | Status: AC
  Administered 2015-10-31: 0.5 mg via INTRAVENOUS
  Filled 2015-10-31: qty 1

## 2015-10-31 MED ORDER — DEXTROSE-NACL 5-0.9 % IV SOLN
INTRAVENOUS | Status: DC
  Administered 2015-10-31 (×2): via INTRAVENOUS

## 2015-10-31 MED ORDER — SODIUM CHLORIDE 0.9 % IV SOLN: 10.0000 mL/h | Freq: Once | INTRAVENOUS | Status: DC

## 2015-10-31 MED ORDER — ONDANSETRON HCL 4 MG/2ML IJ SOLN
INTRAMUSCULAR | Status: DC | PRN
  Administered 2015-10-31: 4 mg via INTRAVENOUS

## 2015-10-31 MED ORDER — ACETAMINOPHEN 160 MG/5ML PO SOLN
325.0000 mg | ORAL | Status: DC | PRN
  Filled 2015-10-31: qty 20.3

## 2015-10-31 MED ORDER — OXYCODONE HCL 5 MG/5ML PO SOLN: 5.0000 mg | Freq: Once | ORAL | Status: AC | PRN

## 2015-10-31 MED ORDER — LACTATED RINGERS IV SOLN
INTRAVENOUS | Status: DC
  Administered 2015-10-31 (×4): via INTRAVENOUS

## 2015-10-31 MED ORDER — 0.9 % SODIUM CHLORIDE (POUR BTL) OPTIME
TOPICAL | Status: DC | PRN
  Administered 2015-10-31: 1000 mL

## 2015-10-31 SURGICAL SUPPLY — 42 items
BANDAGE ELASTIC 4 VELCRO ST LF (GAUZE/BANDAGES/DRESSINGS) ×2 IMPLANT
BANDAGE ELASTIC 6 VELCRO ST LF (GAUZE/BANDAGES/DRESSINGS) ×2 IMPLANT
BLADE SAW RECIP 87.9 MT (BLADE) ×2 IMPLANT
BNDG COHESIVE 4X5 TAN STRL (GAUZE/BANDAGES/DRESSINGS) ×2 IMPLANT
BNDG COHESIVE 6X5 TAN STRL LF (GAUZE/BANDAGES/DRESSINGS) ×2 IMPLANT
BNDG GAUZE ELAST 4 BULKY (GAUZE/BANDAGES/DRESSINGS) ×2 IMPLANT
CANISTER SUCTION 2500CC (MISCELLANEOUS) ×2 IMPLANT
CLIP TI MEDIUM 6 (CLIP) IMPLANT
COVER SURGICAL LIGHT HANDLE (MISCELLANEOUS) ×2 IMPLANT
COVER TABLE BACK 60X90 (DRAPES) ×2 IMPLANT
DRAIN CHANNEL 19F RND (DRAIN) IMPLANT
DRAPE ORTHO SPLIT 77X108 STRL (DRAPES) ×2
DRAPE PROXIMA HALF (DRAPES) ×2 IMPLANT
DRAPE SURG ORHT 6 SPLT 77X108 (DRAPES) ×2 IMPLANT
DRSG ADAPTIC 3X8 NADH LF (GAUZE/BANDAGES/DRESSINGS) ×2 IMPLANT
ELECT REM PT RETURN 9FT ADLT (ELECTROSURGICAL) ×2
ELECTRODE REM PT RTRN 9FT ADLT (ELECTROSURGICAL) ×1 IMPLANT
EVACUATOR SILICONE 100CC (DRAIN) IMPLANT
GAUZE SPONGE 4X4 12PLY STRL (GAUZE/BANDAGES/DRESSINGS) ×2 IMPLANT
GLOVE BIO SURGEON STRL SZ7.5 (GLOVE) ×2 IMPLANT
GOWN STRL REUS W/ TWL LRG LVL3 (GOWN DISPOSABLE) ×3 IMPLANT
GOWN STRL REUS W/TWL LRG LVL3 (GOWN DISPOSABLE) ×3
KIT BASIN OR (CUSTOM PROCEDURE TRAY) ×2 IMPLANT
KIT ROOM TURNOVER OR (KITS) ×2 IMPLANT
NS IRRIG 1000ML POUR BTL (IV SOLUTION) ×2 IMPLANT
PACK GENERAL/GYN (CUSTOM PROCEDURE TRAY) ×2 IMPLANT
PAD ARMBOARD 7.5X6 YLW CONV (MISCELLANEOUS) ×4 IMPLANT
SPONGE GAUZE 4X4 12PLY STER LF (GAUZE/BANDAGES/DRESSINGS) ×2 IMPLANT
STAPLER VISISTAT 35W (STAPLE) ×4 IMPLANT
STOCKINETTE IMPERVIOUS LG (DRAPES) ×2 IMPLANT
SUT ETHILON 3 0 PS 1 (SUTURE) IMPLANT
SUT SILK 2 0 SH (SUTURE) IMPLANT
SUT SILK 2 0 SH CR/8 (SUTURE) ×2 IMPLANT
SUT SILK 2 0 TIES 10X30 (SUTURE) ×2 IMPLANT
SUT VIC AB 1 CTX 36 (SUTURE) ×1
SUT VIC AB 1 CTX36XBRD ANBCTR (SUTURE) ×1 IMPLANT
SUT VIC AB 2-0 CT1 18 (SUTURE) ×4 IMPLANT
SUT VIC AB 2-0 SH 18 (SUTURE) IMPLANT
SUT VIC AB 3-0 SH 27 (SUTURE) ×2
SUT VIC AB 3-0 SH 27X BRD (SUTURE) ×2 IMPLANT
UNDERPAD 30X30 INCONTINENT (UNDERPADS AND DIAPERS) ×2 IMPLANT
WATER STERILE IRR 1000ML POUR (IV SOLUTION) ×2 IMPLANT

## 2015-10-31 NOTE — Anesthesia Preprocedure Evaluation (Addendum)
Anesthesia Evaluation  Patient identified by MRN, date of birth, ID band Patient awake    Reviewed: Allergy & Precautions, H&P , NPO status , Patient's Chart, lab work & pertinent test results, reviewed documented beta blocker date and time   History of Anesthesia Complications Negative for: history of anesthetic complications  Airway Mallampati: II  TM Distance: >3 FB Neck ROM: full    Dental  (+) Poor Dentition, Dental Advidsory Given, Loose, Missing   Pulmonary Current Smoker,    breath sounds clear to auscultation       Cardiovascular hypertension, Pt. on medications and Pt. on home beta blockers + Peripheral Vascular Disease   Rhythm:Regular     Neuro/Psych Anxiety Depression Bipolar Disorder CVA, Residual Symptoms    GI/Hepatic GERD  Medicated and Controlled,  Endo/Other  diabetes, Poorly Controlled, Type 1, Insulin Dependent  Renal/GU Renal InsufficiencyRenal disease     Musculoskeletal   Abdominal   Peds  Hematology  (+) Blood dyscrasia, anemia ,   Anesthesia Other Findings   Reproductive/Obstetrics                            Anesthesia Physical Anesthesia Plan  ASA: III  Anesthesia Plan: General   Post-op Pain Management:    Induction: Intravenous  Airway Management Planned: Oral ETT  Additional Equipment: None  Intra-op Plan:   Post-operative Plan: Extubation in OR  Informed Consent: I have reviewed the patients History and Physical, chart, labs and discussed the procedure including the risks, benefits and alternatives for the proposed anesthesia with the patient or authorized representative who has indicated his/her understanding and acceptance.   Dental Advisory Given  Plan Discussed with: Anesthesiologist, CRNA and Surgeon  Anesthesia Plan Comments:        Anesthesia Quick Evaluation

## 2015-10-31 NOTE — Progress Notes (Signed)
Subjective: Patient seen and examined this morning.  Much more lucid.  Alert and oriented x 3.  States he still wants to proceed with amputation.  Spoke with vascular surgery PA who said he is planned to go to OR later this AM.   Objective: Vital signs in last 24 hours: Filed Vitals:   10/31/15 0000 10/31/15 0338 10/31/15 0400 10/31/15 0800  BP: 138/87  111/54 133/96  Pulse: 84  80 77  Temp:  99.6 F (37.6 C)  98.7 F (37.1 C)  TempSrc:  Oral  Oral  Resp: 22  18 20   Height:      Weight:      SpO2: 99%  95% 99%   Weight change:   Intake/Output Summary (Last 24 hours) at 10/31/15 1103 Last data filed at 10/31/15 0800  Gross per 24 hour  Intake 1483.34 ml  Output   1576 ml  Net -92.66 ml   Physical Exam General: Lying in bed, not distressed, able to answer questions HEENT: Atraumatic, No scleral icuterus Cardiovascular: RRR, no m/r/g Pulmonary: breaths non-labored Abdominal: Soft,  umbilical hernia Extremities: Left AKA. Right foot cool to touch at the ankle, and warm more cephalad Neurological: alert and oriented x 3, appropriately answering questions  Lab Results: Basic Metabolic Panel:  Recent Labs Lab 10/30/15 0400 10/31/15 0418  NA 144 140  K 4.0 3.9  CL 113* 106  CO2 19* 21*  GLUCOSE 144* 110*  BUN 6 9  CREATININE 1.20 1.33*  CALCIUM 9.1 9.3   Liver Function Tests:  Recent Labs Lab 10/30/15 0400 10/31/15 0418  AST 48* 42*  ALT 93* 90*  ALKPHOS 96 96  BILITOT 0.4 0.4  PROT 7.3 7.7  ALBUMIN 2.1* 2.3*    Recent Labs Lab 10/25/15 0813  AMMONIA 24   CBC:  Recent Labs Lab 10/25/15 0655 10/25/15 1454  10/30/15 0400 10/31/15 0418  WBC 11.6* 12.0*  < > 8.0 9.1  NEUTROABS 9.3* 9.2*  --   --   --   HGB 6.0* 7.4*  < > 7.6* 7.6*  HCT 19.8* 23.7*  < > 25.6* 24.9*  MCV 79.8 79.8  < > 82.8 80.8  PLT 668* 553*  < > 370 379  < > = values in this interval not displayed. Cardiac Enzymes:  Recent Labs Lab 10/25/15 0655 10/25/15 1454  10/30/15 0400  CKTOTAL 1119*  --  475*  TROPONINI <0.03 <0.03  --    CBG:  Recent Labs Lab 10/30/15 1102 10/30/15 1521 10/30/15 1935 10/30/15 2258 10/31/15 0341 10/31/15 0715  GLUCAP 203* 162* 155* 146* 116* 121*   Anemia Panel:  Recent Labs Lab 10/25/15 0821  VITAMINB12 1127*  FOLATE 31.0  FERRITIN 212  TIBC 251  IRON 22*  RETICCTPCT 4.4*   Urine Drug Screen: Drugs of Abuse     Component Value Date/Time   LABOPIA POSITIVE* 10/25/2015 0800   COCAINSCRNUR POSITIVE* 10/25/2015 0800   LABBENZ POSITIVE* 10/25/2015 0800   AMPHETMU NONE DETECTED 10/25/2015 0800   THCU NONE DETECTED 10/25/2015 0800   LABBARB NONE DETECTED 10/25/2015 0800    Alcohol Level:  Recent Labs Lab 10/25/15 0655  ETH <5   Urinalysis:  Recent Labs Lab 10/25/15 0800  COLORURINE YELLOW  LABSPEC 1.019  PHURINE 5.5  GLUCOSEU NEGATIVE  HGBUR NEGATIVE  BILIRUBINUR NEGATIVE  KETONESUR NEGATIVE  PROTEINUR NEGATIVE  NITRITE NEGATIVE  LEUKOCYTESUR NEGATIVE    Studies/Results: No results found. Medications: I have reviewed the patient's current medications. Scheduled Meds: . sodium  chloride   Intravenous Once  . aspirin EC  81 mg Oral Daily  . atenolol  50 mg Oral BID  . atorvastatin  40 mg Oral q1800  . buPROPion  100 mg Oral BID  . cloNIDine  0.1 mg Transdermal Weekly  . docusate  50 mg Oral Daily  . folic acid  1 mg Oral Daily  . heparin subcutaneous  5,000 Units Subcutaneous Q8H  . hydrochlorothiazide  25 mg Oral Daily  . insulin aspart  0-9 Units Subcutaneous Q4H  . multivitamin with minerals  1 tablet Oral Daily  . piperacillin-tazobactam (ZOSYN)  IV  3.375 g Intravenous Q8H  . QUEtiapine  600 mg Oral QHS  . sodium chloride flush  3 mL Intravenous Q12H  . thiamine  100 mg Oral Daily  . vancomycin  750 mg Intravenous Q12H   Continuous Infusions: . dextrose 5 % and 0.9% NaCl 100 mL/hr at 10/31/15 0907   PRN Meds:.hydrALAZINE, morphine injection, ondansetron **OR**  ondansetron (ZOFRAN) IV Assessment/Plan:  Altered Mental Status- improved: Likely multifactorial in setting of sepsis, polysubstance abuse, mental illness. Has not required medication for agitation, now outside of window for concern of DT's - Haldol stopped yesterday.  Can consider restarting if patient has issues with agitation - Seroquel 600 mg qhs.  Home dose is  qhs and goal will be to get there - Continue Wellbutrin  BID - q2h neuro check - Thiamine, folate - D5 NS  Right Foot Wet Gangrene: Foot remains cool. Patient still agreeable to surgical amputation.  Off Xarelto since 4/27.  Continuing on antibiotics.  Vascular surgery assistance with this patient is appreciated. - OR today - Vanc and Zosyn IV per pharm - Holding Xarelto - dysphagia 2 diet - final blood cultures no growth - PT/OT eval recommending SNF --> may need their re-evaluation s/p amputation  Elevated AST, ALT mild, stable: Could be related alcohol abuse (although denies) and/or elevated CK 2/2 ischemic foot. ALT remains elevated 2/1 ratio.  - follow for now.   Normocytic Anemia: -hgb stable after 1 unit tx since admission. - follow CBC  - Type and screen yesterday, may need to be transfused since starting hemoglobin < 8  T2DM:  On home insulin.- SSI  HTN: BP better today - continue atenolol  BID, clonidine patch 0.1mg  Q7Days, HCTZ  daily - continue hydralazine  IV prn.  - Follow BP  Elevated Creatine: slightly elevated from yesterday to 1.3, has been NPO since MN, IV fluids expired yesterday afternoon - continue IV fluids - follow for now - pharmacy monitoring vanc dosing - still with good UOP  DVT PPx: Heparin [place]  Dispo: Disposition is deferred at this time, awaiting improvement of current medical problems.    The patient does have a current PCP (Pcp Not In System) and does not need an [facility] hospital follow-up appointment after discharge.  The patient does have transportation  limitations that hinder transportation to clinic appointments.  .Services Needed at time of discharge: Y = Yes, Blank = No PT:   OT:   RN:   Equipment:   Other:     LOS: 6 days   Gwynn Burly, DO 10/31/2015, 11:03 AM

## 2015-10-31 NOTE — Interval H&P Note (Signed)
History and Physical Interval Note:  10/31/2015 1:09 PM  [name] [name]  has presented today for surgery, with the diagnosis of Gangrene right foot I96  The various methods of treatment have been discussed with the patient and family. After consideration of risks, benefits and other options for treatment, the patient has consented to  Procedure(s): AMPUTATION ABOVE KNEE (Right) as a surgical intervention .  The patient's history has been reviewed, patient examined, no change in status, stable for surgery.  I have reviewed the patient's chart and labs.  Questions were answered to the patient's satisfaction.     Fabienne BrunsFields, [name] [name]

## 2015-10-31 NOTE — Clinical Social Work Note (Signed)
Patient currently in surgery. CSW will present bed offers at a later time.  Charlynn CourtSarah Mykah Bellomo, CSW [phone]

## 2015-10-31 NOTE — Progress Notes (Signed)
PT Cancellation Note  Patient Details Name: [name] LauthRobert [name] MRN: [id number] DOB: 06/24/1966   Cancelled Treatment:    Reason Eval/Treat Not Completed: Patient at procedure or test/unavailable.  Pt is in OR.  PT to check back tomorrow.  Thanks,   Rollene Rotundaebecca B. [place], PT, DPT [phone]   10/31/2015, 1:39 PM

## 2015-10-31 NOTE — H&P (View-Only) (Signed)
  [facility] Progress Note  Subjective  Having pain with right foot.   Objective Filed Vitals:   10/30/15 0422 10/30/15 0521  BP:  137/86  Pulse:  96  Temp: 97.8 F (36.6 C)   Resp:  23    Intake/Output Summary (Last 24 hours) at 10/30/15 0736 Last data filed at 10/30/15 0600  Gross per 24 hour  Intake 3238.32 ml  Output   2925 ml  Net 313.32 ml   Answers questions, but mumbles.  Palpable right ax-fem graft pulse Right foot with gangrenous toes and demarcation past midfoot.   Assessment/Planning: 49 y.o. male with ischemic right foot and altered mental status.  More lucid this am, but mumbling and unable to answer complete questions.  Tmax of 100 overnight, afebrile this am. No leukocytosis. No indications for urgent surgery.  Hopefully mental status will improve over next couple days for patient to consent to amputation as he has previously refused amputation.   Dynver Clemson A Icess Bertoni 10/30/2015 7:36 AM -- Alert and oriented x 3.  Family present.  Pt competent to make decisions currently.  He wishes AKA right leg.  Will proceed tomorrow  NPO p midnight Consent Type and screen.  Will most likely need transfusion since starting hemoglobin < 8  Charles Fields, MD [facility] [place] Office: [phone] Pager: [phone]  Laboratory CBC    Component Value Date/Time   WBC 8.0 10/30/2015 0400   HGB 7.6* 10/30/2015 0400   HCT 25.6* 10/30/2015 0400   PLT 370 10/30/2015 0400    BMET    Component Value Date/Time   NA 144 10/30/2015 0400   K 4.0 10/30/2015 0400   CL 113* 10/30/2015 0400   CO2 19* 10/30/2015 0400   GLUCOSE 144* 10/30/2015 0400   BUN 6 10/30/2015 0400   CREATININE 1.20 10/30/2015 0400   CALCIUM 9.1 10/30/2015 0400   GFRNONAA >60 10/30/2015 0400   GFRAA >60 10/30/2015 0400    COAG Lab Results  Component Value Date   INR 1.39 10/26/2015   INR 1.23 10/02/2015   INR 1.10 12/27/2014   No results  found for: PTT  Antibiotics Anti-infectives    Start     Dose/Rate Route Frequency Ordered Stop   10/27/15 2230  vancomycin (VANCOCIN) IVPB 750 mg/150 ml premix     750 mg 150 mL/hr over 60 Minutes Intravenous Every 12 hours 10/27/15 2203     10/25/15 1700  vancomycin (VANCOCIN) IVPB 1000 mg/200 mL premix  Status:  Discontinued     1,000 mg 200 mL/hr over 60 Minutes Intravenous Every 8 hours 10/25/15 0810 10/27/15 0925   10/25/15 1600  piperacillin-tazobactam (ZOSYN) IVPB 3.375 g     3.375 g 12.5 mL/hr over 240 Minutes Intravenous Every 8 hours 10/25/15 0810     10/25/15 0815  piperacillin-tazobactam (ZOSYN) IVPB 3.375 g     3.375 g 100 mL/hr over 30 Minutes Intravenous  Once 10/25/15 0810 10/25/15 0902   10/25/15 0815  vancomycin (VANCOCIN) 2,000 mg in sodium chloride 0.9 % 500 mL IVPB     2,000 mg 250 mL/hr over 120 Minutes Intravenous  Once 10/25/15 0810 10/25/15 1151       Christoph Copelan, PA-C [facility] Office: [phone] Pager: [phone] 10/30/2015 7:36 AM      

## 2015-10-31 NOTE — Anesthesia Procedure Notes (Signed)
Procedure Name: LMA Insertion Date/Time: 10/31/2015 1:41 PM Performed by: Carmela RimaMARTINELLI, Keimani Laufer F Pre-anesthesia Checklist: Patient identified, Suction available, Emergency Drugs available, Patient being monitored and Timeout performed Patient Re-evaluated:Patient Re-evaluated prior to inductionOxygen Delivery Method: Circle system utilized Preoxygenation: Pre-oxygenation with 100% oxygen Intubation Type: IV induction Ventilation: Mask ventilation without difficulty LMA: LMA inserted LMA Size: 4.0 Number of attempts: 1 Airway Equipment and Method: Patient positioned with wedge pillow Tube secured with: Tape Dental Injury: Teeth and Oropharynx as per pre-operative assessment

## 2015-10-31 NOTE — Transfer of Care (Signed)
Immediate Anesthesia Transfer of Care Note  Patient: [name] [name]  Procedure(s) Performed: Procedure(s): RIGHT ABOVE THE  KNEE  AMPUTATION (Right)  Patient Location: PACU  Anesthesia Type:General  Level of Consciousness: awake and alert   Airway & Oxygen Therapy: Patient Spontanous Breathing and Patient connected to nasal cannula oxygen  Post-op Assessment: Report given to RN, Post -op Vital signs reviewed and stable and Patient moving all extremities X 4  Post vital signs: Reviewed and stable  Last Vitals:  Filed Vitals:   10/31/15 1200 10/31/15 1236  BP: 121/95   Pulse:    Temp:  37.2 C  Resp: 21     Last Pain:  Filed Vitals:   10/31/15 1242  PainSc: 0-No pain         Complications: No apparent anesthesia complications

## 2015-10-31 NOTE — Op Note (Signed)
[facility] OPERATIVE NOTE Procedure: Right above knee amputation  Surgeon(s): Sherren Kernsharles E Darel Ricketts, MD  ASSISTANT: Lianne CureMaureen Collins, PA-C  Anesthesia: General  Specimens: Right leg  PROCEDURE DETAIL: After obtaining informed consent, the patient was taken to the operating room. The patient was placed in supine position the operating room table. After induction of general anesthesia and endotracheal intubation the patient's Foley catheter was placed. Next patient's entire left lower extremity was prepped and draped in usual sterile fashion. A circumferential incision was made on the left leg just above the knee. The incision was carried down into the sucutaneous tissues down to level the saphenous vein. This was ligated and divided between silk ties. Soft tissues were taken down as well as the muscle and fascia with cautery. The superficial femoral artery and vein were dissected free circumferentially clamped and divided. These were suture ligated proximally. Remainder of the soft tissues were taken down with cautery. The periosteum was raised on the femur approximately 5 cm above the skin edge. The femur was divided at this level. The leg was passed off the table as a specimen. Hemostasis was obtained. The wound was thoroughly irrigated with normal saline solution. The fascial edges were reapproximated using interrupted 2 0 Vicryl sutures. The subcutaneous tissues reapproximated using a running 3-0 Vicryl suture. The skin was closed staples. Patient tolerated procedure well and there were no complications. Instrument sponge and needle counts correct in the case. Patient was taken to recovery in stable condition.  Fabienne Brunsharles Afrika Brick, MD [facility] [place] Office: [phone] Pager: [phone]

## 2015-10-31 NOTE — Progress Notes (Signed)
         Post right LE amputaion  Resume anticoagulation on 5/52017 We want to continue IV antibiotics for 24 more hours post-op then they can be D/C'd.  Mosetta PigeonOLLINS, Jensine Luz MAUREEN PA-C

## 2015-11-01 ENCOUNTER — Encounter (HOSPITAL_COMMUNITY): Payer: Self-pay | Admitting: Vascular Surgery

## 2015-11-01 DIAGNOSIS — R410 Disorientation, unspecified: Secondary | ICD-10-CM

## 2015-11-01 DIAGNOSIS — Z89611 Acquired absence of right leg above knee: Secondary | ICD-10-CM

## 2015-11-01 LAB — TYPE AND SCREEN
ABO/RH(D): A POS
ANTIBODY SCREEN: NEGATIVE
UNIT DIVISION: 0
UNIT DIVISION: 0

## 2015-11-01 LAB — GLUCOSE, CAPILLARY
GLUCOSE-CAPILLARY: 162 mg/dL — AB (ref 65–99)
GLUCOSE-CAPILLARY: 171 mg/dL — AB (ref 65–99)
Glucose-Capillary: 135 mg/dL — ABNORMAL HIGH (ref 65–99)
Glucose-Capillary: 144 mg/dL — ABNORMAL HIGH (ref 65–99)
Glucose-Capillary: 159 mg/dL — ABNORMAL HIGH (ref 65–99)
Glucose-Capillary: 217 mg/dL — ABNORMAL HIGH (ref 65–99)

## 2015-11-01 LAB — BASIC METABOLIC PANEL
ANION GAP: 11 (ref 5–15)
BUN: 11 mg/dL (ref 6–20)
CALCIUM: 9 mg/dL (ref 8.9–10.3)
CO2: 21 mmol/L — ABNORMAL LOW (ref 22–32)
Chloride: 104 mmol/L (ref 101–111)
Creatinine, Ser: 1.18 mg/dL (ref 0.61–1.24)
GFR calc Af Amer: >60 mL/min (ref >60)
Glucose, Bld: 141 mg/dL — ABNORMAL HIGH (ref 65–99)
Potassium: 4.2 mmol/L (ref 3.5–5.1)
SODIUM: 136 mmol/L (ref 135–145)

## 2015-11-01 LAB — CBC
HCT: 27.2 % — ABNORMAL LOW (ref 39.0–52.0)
Hemoglobin: 8.7 g/dL — ABNORMAL LOW (ref 13.0–17.0)
MCH: 25.1 pg — ABNORMAL LOW (ref 26.0–34.0)
MCHC: 32 g/dL (ref 30.0–36.0)
MCV: 78.6 fL (ref 78.0–100.0)
PLATELETS: 336 10*3/uL (ref 150–400)
RBC: 3.46 MIL/uL — ABNORMAL LOW (ref 4.22–5.81)
RDW: 16.2 % — AB (ref 11.5–15.5)
WBC: 8.1 10*3/uL (ref 4.0–10.5)

## 2015-11-01 MED ORDER — MORPHINE SULFATE (PF) 2 MG/ML IV SOLN
2.0000 mg | INTRAVENOUS | Status: DC | PRN
  Administered 2015-11-01: 2 mg via INTRAVENOUS
  Filled 2015-11-01: qty 1

## 2015-11-01 MED ORDER — OXYCODONE HCL 5 MG PO TABS
5.0000 mg | ORAL_TABLET | Freq: Four times a day (QID) | ORAL | Status: DC
  Administered 2015-11-01 – 2015-11-02 (×4): 5 mg via ORAL
  Filled 2015-11-01 (×4): qty 1
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
## 2015-11-01 NOTE — Progress Notes (Addendum)
Pt to transfer to 5W38 via chair refused to go by bed with belongings.

## 2015-11-01 NOTE — Progress Notes (Signed)
[facility] [place]  Subjective  - feels ok, more alert   Objective 136/77 90 98.1 F (36.7 C) (Oral) 21 98%  Intake/Output Summary (Last 24 hours) at 11/01/15 0814 Last data filed at 11/01/15 0416  Gross per 24 hour  Intake 4936.16 ml  Output   2125 ml  Net 2811.16 ml   Right AKA incision intact  Assessment/Planning: Dry dressing ACE for AKA Disposition planning from my standpoint Restart anticoagulation tomorrow  Fabienne BrunsFields, Charles 11/01/2015 8:14 AM --  Laboratory Lab Results:  Recent Labs  10/31/15 0418 11/01/15 0405  WBC 9.1 8.1  HGB 7.6* 8.7*  HCT 24.9* 27.2*  PLT 379 336   BMET  Recent Labs  10/31/15 0418 11/01/15 0405  NA 140 136  K 3.9 4.2  CL 106 104  CO2 21* 21*  GLUCOSE 110* 141*  BUN 9 11  CREATININE 1.33* 1.18  CALCIUM 9.3 9.0    COAG Lab Results  Component Value Date   INR 1.39 10/26/2015   INR 1.23 10/02/2015   INR 1.10 12/27/2014   No results found for: PTT

## 2015-11-01 NOTE — Clinical Social Work Note (Signed)
CSW met with patient to present bed offers. Patient's mother is familiar with [facility] since another family member is currently there. PT is now recommending CIR so SNF will be a back up.  Dayton Scrape, [place]

## 2015-11-01 NOTE — Progress Notes (Addendum)
Pt transferred from 3S07 via recliner. Pt refusing to let nurse assess his skin and is very confused. Provider to be notified. Awaiting further orders. Per Kyung Ruddkennedy, md pt is confused at baseline. No further orders given
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
## 2015-11-01 NOTE — Progress Notes (Signed)
Orthopedic Tech Progress Note Patient Details:  [name] LauthRobert [name] 11/28/1965 [id number]  Patient ID: [name] [name], male   DOB: 11/28/1965, 50 y.o.   MRN: [id number] Called in bio-tech brace order; spoke with [name] MaltaStephanie  [name] [name] 11/01/2015, 9:28 AM

## 2015-11-01 NOTE — Progress Notes (Signed)
Physical Therapy Treatment/Re-Evaluation Patient Details Name: [name] LauthRobert Dickerson MRN: [id number] DOB: 14-Jan-1966 Today's Date: 11/01/2015    History of Present Illness 50 y.o. male [facility] with right foot pain and AMS.  Pt dx with multifactorial AMS in the setting of sepsis, polysubstance abuse, and mental illness.  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     PT Comments    Pt was able to scoot, assisted, OOB to the chair today.  He is min to mod assist overall and moving well, limited by post op pain, but eager to move and participate so he can be closer to going home.  It would be good to try to get a WC to practice a scoot pivot or slide [facility] (much more functional), but pt did well with AP to recliner chair today.  PT continues to recommend CIR level therapies at discharge.    Follow Up Recommendations  CIR     Equipment Recommendations  None recommended by PT    Recommendations for Other Services Rehab consult     Precautions / Restrictions Precautions Precautions: Fall Precaution Comments: Lt AKA, no prosthesis and now new R AKA    Mobility  Bed Mobility Overal bed mobility: Needs Assistance Bed Mobility: Rolling;Supine to Sit Rolling: Min assist   Supine to sit: Min assist;HOB elevated     General bed mobility comments: Min assist to help support pelvis to help roll all the way to side lying bil to situate new bed pad under patient to use to help with OOB AP transfer Min hand held assist to pull to long [facility] ~40 degrees  Transfers Overall transfer level: Needs assistance Equipment used: None Transfers: Anterior-Posterior Transfer Sit to  Stand: Mod assist;From elevated surface     Anterior-Posterior transfers: Mod assist   General transfer comment: Mod assist to scoot backwards into recliner chair.  Assist needed at hips to help move hips around once [facility].  He did better once he was close enough to get both hands on both arm rests of the chair.                       Balance Overall balance assessment: Needs assistance Sitting-balance support: Bilateral upper extremity supported Sitting balance-Leahy Scale: Poor Sitting balance - Comments: posterior preference, but able to correct with min to min guard assist at trunk during transitions Postural control: Posterior lean                          Cognition Arousal/Alertness: Awake/alert Behavior During Therapy: Anxious (seems a bit paranoid) Overall Cognitive Status: No family/caregiver present to determine baseline cognitive functioning Area of Impairment: Memory;Safety/judgement;Awareness;Problem solving   Current Attention Level: Sustained Memory: Decreased short-term memory Following Commands: Follows one step commands consistently Safety/Judgement: Decreased awareness of safety Awareness: Emergent Problem Solving: Difficulty sequencing;Requires verbal cues;Requires tactile cues General Comments: Pt reports we have his WC, moving quickly, seems paranoid that some staff are out to get him           Pertinent Vitals/Pain Pain Assessment: Faces Faces Pain Scale:  Hurts even more Pain Location: right residual limb Pain Descriptors / Indicators: Guarding;Grimacing Pain Intervention(s): Limited activity within patient's tolerance;Monitored during session;Repositioned;Patient requesting pain meds-RN notified;RN gave pain meds during session           PT Goals (current goals can now be found in the care plan section) Acute Rehab PT Goals Patient Stated Goal: To get home as soon as he can.  He is open  to the idea of rehab somewhere "for about a week" PT Goal Formulation: With patient Time For Goal Achievement: 11/15/15 Potential to Achieve Goals: Good Progress towards PT goals: Progressing toward goals (re-eval preformed)    Frequency  Min 3X/week    PT Plan Current plan remains appropriate       End of Session   Activity Tolerance: Patient tolerated treatment well Patient left: in chair;with call bell/phone within reach;with chair alarm set;with nursing/sitter in room     Time: 1100-1115 PT Time Calculation (min) (ACUTE ONLY): 15 min  Charges:    1 re-eval                     Doshie Maggi B. Lauralyn Shadowens, PT, DPT [phone]   11/01/2015, 12:28 PM

## 2015-11-01 NOTE — Progress Notes (Signed)
Subjective: Patient seen and examined this morning.  S/p right AKA post-op day #1. He is alert, sitting up in bed.  He states is pain is not well controlled at this point.   Objective: Vital signs in last 24 hours: Filed Vitals:   10/31/15 2159 11/01/15 0001 11/01/15 0415 11/01/15 0800  BP: 119/71 157/97 136/77 145/81  Pulse: 83 80 90 90  Temp:  98 F (36.7 C) 98.1 F (36.7 C) 100 F (37.8 C)  TempSrc:  Oral Oral Axillary  Resp: 24 16 21 20   Height:      Weight:      SpO2: 99% 100% 98% 98%   Weight change:   Intake/Output Summary (Last 24 hours) at 11/01/15 1028 Last data filed at 11/01/15 0800  Gross per 24 hour  Intake 5226.16 ml  Output   2625 ml  Net 2601.16 ml   Physical Exam General: sitting up in bed, not distressed, able to answer questions HEENT: Atraumatic, No scleral icuterus Pulmonary: breaths non-labored Extremities: Left AKA. Right AKA with no bleeding or drainage, staple in place Neurological: alert and oriented x 3, appropriately answering questions  Lab Results: Basic Metabolic Panel:  Recent Labs Lab 10/31/15 0418 11/01/15 0405  NA 140 136  K 3.9 4.2  CL 106 104  CO2 21* 21*  GLUCOSE 110* 141*  BUN 9 11  CREATININE 1.33* 1.18  CALCIUM 9.3 9.0   Liver Function Tests:  Recent Labs Lab 10/30/15 0400 10/31/15 0418  AST 48* 42*  ALT 93* 90*  ALKPHOS 96 96  BILITOT 0.4 0.4  PROT 7.3 7.7  ALBUMIN 2.1* 2.3*   CBC:  Recent Labs Lab 10/25/15 1454  10/31/15 0418 11/01/15 0405  WBC 12.0*  < > 9.1 8.1  NEUTROABS 9.2*  --   --   --   HGB 7.4*  < > 7.6* 8.7*  HCT 23.7*  < > 24.9* 27.2*  MCV 79.8  < > 80.8 78.6  PLT 553*  < > 379 336  < > = values in this interval not displayed. Cardiac Enzymes:  Recent Labs Lab 10/25/15 1454 10/30/15 0400  CKTOTAL  --  475*  TROPONINI <0.03  --    CBG:  Recent Labs Lab 10/31/15 1229 10/31/15 1517 10/31/15 2047 11/01/15 0011 11/01/15 0414 11/01/15 0814  GLUCAP 178* 119* 168* 159*  135* 162*   Anemia Panel: No results for input(s): VITAMINB12, FOLATE, FERRITIN, TIBC, IRON, RETICCTPCT in the last 168 hours. Urine Drug Screen: Drugs of Abuse     Component Value Date/Time   LABOPIA POSITIVE* 10/25/2015 0800   COCAINSCRNUR POSITIVE* 10/25/2015 0800   LABBENZ POSITIVE* 10/25/2015 0800   AMPHETMU NONE DETECTED 10/25/2015 0800   THCU NONE DETECTED 10/25/2015 0800   LABBARB NONE DETECTED 10/25/2015 0800    Studies/Results: No results found. Medications: I have reviewed the patient's current medications. Scheduled Meds: . sodium chloride   Intravenous Once  . sodium chloride  10 mL/hr Intravenous Once  . aspirin EC  81 mg Oral Daily  . atenolol  50 mg Oral BID  . atorvastatin  40 mg Oral q1800  . buPROPion  100 mg Oral BID  . cloNIDine  0.1 mg Transdermal Weekly  . docusate  50 mg Oral Daily  . folic acid  1 mg Oral Daily  . hydrochlorothiazide  25 mg Oral Daily  . insulin aspart  0-9 Units Subcutaneous Q4H  . multivitamin with minerals  1 tablet Oral Daily  . oxyCODONE  5 mg  Oral Q6H  . piperacillin-tazobactam (ZOSYN)  IV  3.375 g Intravenous Q8H  . QUEtiapine  600 mg Oral QHS  . sodium chloride flush  3 mL Intravenous Q12H  . thiamine  100 mg Oral Daily  . vancomycin  750 mg Intravenous Q12H   Continuous Infusions: . lactated ringers 10 mL/hr at 10/31/15 2058   PRN Meds:.hydrALAZINE, morphine injection, ondansetron **OR** ondansetron (ZOFRAN) IV Assessment/Plan:  Altered Mental Status- improved: Likely multifactorial in setting of sepsis, polysubstance abuse, mental illness. Has not required medication for agitation, now outside of window for concern of DT's - Haldol stopped.  Can consider restarting if patient has issues with agitation - Seroquel 600 mg qhs.  Home dose is  qhs and goal will be to get there.  Continue 600 for 1 more day, then up to  - Continue Wellbutrin  BID - q2h neuro check - Thiamine, folate  Right Foot Wet  Gangrene s/p AKA 5/3: Patient doing well post-operatively, but is having pain not well controlled. PT/OT recommending SNF placement. CSW following for bed offers.   - Vanc and Zosyn IV per pharm --> plan for discontinuing 5/5 - Resume home xarelto 5/5.  This medication was started in May 2016 and per [facility] notes was intended to be long term d/t thrombus in right femoral anastamosis - dysphagia 2 diet - final blood cultures no growth - PT/OT eval recommending SNF --> await CSW bed offers - morphine 2-4mg  q2h prn and oxycodone  6qh prn during acute post-op phase.  Will be judicious with narcotics at d/c to SNF based on history of substance abuse - transfer to med-surg  Elevated AST, ALT mild, stable: Could be related alcohol abuse (although denies) and/or elevated CK 2/2 ischemic foot. ALT remains elevated 2/1 ratio.  - follow for now.   Normocytic Anemia: -hgb stable after surgery and PRBCs - follow CBC   T2DM:  On home insulin.- SSI  HTN: BP better today - continue atenolol  BID, clonidine patch 0.1mg  Q7Days, HCTZ  daily - continue hydralazine  IV prn.  - Follow BP  Elevated Creatine: improved today to 1.18 from 1.3 - follow for now - pharmacy monitoring vanc dosing - still with good UOP  DVT PPx: Heparin [place]  Dispo: Disposition is deferred at this time, awaiting improvement of current medical problems.    The patient does have a current PCP (Pcp Not In System) and does not need an [facility] hospital follow-up appointment after discharge.  The patient does have transportation limitations that hinder transportation to clinic appointments.  .Services Needed at time of discharge: Y = Yes, Blank = No PT:  SNF  OT:  SNF  RN:   Equipment:   Other:     LOS: 7 days   Gwynn Burly, DO 11/01/2015, 10:28 AM

## 2015-11-01 NOTE — Care Management Note (Signed)
Case Management Note  Patient Details  Name: [name] LauthRobert [name] MRN: [id number] Date of Birth: [birth year]  Subjective/Objective:   Pod 1 AKA, will start anticoagulation on 5/5. Previous pt eval rec SNF,  Most recent pt eval rec CIR.  CSW still following for backup.  NCM will cont to follow for dc needs also.                 Action/Plan:   Expected Discharge Date:                  Expected Discharge Plan:  Skilled Nursing Facility  In-House Referral:  Clinical Social Work  Discharge planning Services  CM Consult  Post Acute Care Choice:    Choice offered to:     DME Arranged:    DME Agency:     HH Arranged:    HH Agency:     Status of Service:  In process, will continue to follow  Medicare Important Message Given:    Date Medicare IM Given:    Medicare IM give by:    Date Additional Medicare IM Given:    Additional Medicare Important Message give by:     If discussed at Long Length of Stay Meetings, dates discussed:    Additional Comments:  Leone Havenaylor, Shunsuke Granzow [place], RN 11/01/2015, 2:25 PM

## 2015-11-02 DIAGNOSIS — F310 Bipolar disorder, current episode hypomanic: Secondary | ICD-10-CM | POA: Insufficient documentation

## 2015-11-02 LAB — CBC
HEMATOCRIT: 28.1 % — AB (ref 39.0–52.0)
Hemoglobin: 9 g/dL — ABNORMAL LOW (ref 13.0–17.0)
MCH: 25.6 pg — AB (ref 26.0–34.0)
MCHC: 32 g/dL (ref 30.0–36.0)
MCV: 79.8 fL (ref 78.0–100.0)
PLATELETS: 337 10*3/uL (ref 150–400)
RBC: 3.52 MIL/uL — ABNORMAL LOW (ref 4.22–5.81)
RDW: 16.1 % — AB (ref 11.5–15.5)
WBC: 10.7 10*3/uL — ABNORMAL HIGH (ref 4.0–10.5)

## 2015-11-02 LAB — BASIC METABOLIC PANEL
ANION GAP: 13 (ref 5–15)
BUN: 14 mg/dL (ref 6–20)
CALCIUM: 9.3 mg/dL (ref 8.9–10.3)
CO2: 22 mmol/L (ref 22–32)
CREATININE: 1.19 mg/dL (ref 0.61–1.24)
Chloride: 100 mmol/L — ABNORMAL LOW (ref 101–111)
GFR calc Af Amer: >60 mL/min (ref >60)
GLUCOSE: 139 mg/dL — AB (ref 65–99)
Potassium: 3.7 mmol/L (ref 3.5–5.1)
Sodium: 135 mmol/L (ref 135–145)

## 2015-11-02 LAB — GLUCOSE, CAPILLARY
GLUCOSE-CAPILLARY: 134 mg/dL — AB (ref 65–99)
GLUCOSE-CAPILLARY: 197 mg/dL — AB (ref 65–99)
Glucose-Capillary: 130 mg/dL — ABNORMAL HIGH (ref 65–99)
Glucose-Capillary: 147 mg/dL — ABNORMAL HIGH (ref 65–99)
Glucose-Capillary: 151 mg/dL — ABNORMAL HIGH (ref 65–99)
Glucose-Capillary: <10 mg/dL — CL (ref 65–99)

## 2015-11-02 MED ORDER — PRO-STAT SUGAR FREE PO LIQD
30.0000 mL | Freq: Every day | ORAL | Status: DC
  Administered 2015-11-02 – 2015-11-04 (×3): 30 mL via ORAL
  Filled 2015-11-02 (×3): qty 30

## 2015-11-02 MED ORDER — NICOTINE 14 MG/24HR TD PT24
14.0000 mg | MEDICATED_PATCH | Freq: Every day | TRANSDERMAL | Status: DC
  Administered 2015-11-02 – 2015-11-04 (×3): 14 mg via TRANSDERMAL
  Filled 2015-11-02 (×3): qty 1

## 2015-11-02 MED ORDER — INSULIN ASPART 100 UNIT/ML ~~LOC~~ SOLN
0.0000 [IU] | Freq: Three times a day (TID) | SUBCUTANEOUS | Status: DC
  Administered 2015-11-03 (×3): 2 [IU] via SUBCUTANEOUS
  Administered 2015-11-04: 3 [IU] via SUBCUTANEOUS
  Administered 2015-11-04: 1 [IU] via SUBCUTANEOUS

## 2015-11-02 MED ORDER — OXYCODONE HCL 5 MG PO TABS
5.0000 mg | ORAL_TABLET | Freq: Four times a day (QID) | ORAL | Status: DC | PRN
  Administered 2015-11-02 – 2015-11-04 (×8): 5 mg via ORAL
  Filled 2015-11-02 (×8): qty 1

## 2015-11-02 MED ORDER — ALUM & MAG HYDROXIDE-SIMETH 200-200-20 MG/5ML PO SUSP
15.0000 mL | ORAL | Status: DC | PRN
  Administered 2015-11-02: 15 mL via ORAL
  Filled 2015-11-02: qty 30

## 2015-11-02 MED ORDER — PANTOPRAZOLE SODIUM 40 MG PO TBEC
40.0000 mg | DELAYED_RELEASE_TABLET | Freq: Every day | ORAL | Status: DC
  Administered 2015-11-02 – 2015-11-04 (×3): 40 mg via ORAL
  Filled 2015-11-02 (×3): qty 1

## 2015-11-02 MED ORDER — QUETIAPINE FUMARATE ER 400 MG PO TB24
800.0000 mg | ORAL_TABLET | Freq: Every day | ORAL | Status: DC
  Administered 2015-11-02 – 2015-11-03 (×2): 800 mg via ORAL
  Filled 2015-11-02 (×3): qty 2

## 2015-11-02 MED ORDER — RIVAROXABAN 20 MG PO TABS
20.0000 mg | ORAL_TABLET | Freq: Every day | ORAL | Status: DC
  Administered 2015-11-02 – 2015-11-03 (×2): 20 mg via ORAL
  Filled 2015-11-02 (×2): qty 1

## 2015-11-02 MED ORDER — INSULIN ASPART 100 UNIT/ML ~~LOC~~ SOLN
0.0000 [IU] | Freq: Every day | SUBCUTANEOUS | Status: DC
Start: 2015-11-02 — End: 2015-11-04

## 2015-11-02 NOTE — Progress Notes (Signed)
Inpatient Rehabilitation  Met with patient to discuss team's recommendation for IP Rehab post acute care stay.  Patient appeared confused, but I shared booklets and answered questions.  IP Rehab has limited bed availability in the next couple days.  Plan to follow along for timing of medical readiness and bed availability.  My co-worker will follow up Monday, 11/05/15.  Please call with questions.    Carmelia Roller., CCC/SLP Admission Coordinator  [place]  Cell [phone]

## 2015-11-02 NOTE — Anesthesia Postprocedure Evaluation (Signed)
Anesthesia Post Note  Patient: [name] [name]  Procedure(s) Performed: Procedure(s) (LRB): RIGHT ABOVE THE  KNEE  AMPUTATION (Right)  Patient location during evaluation: PACU Anesthesia Type: General Level of consciousness: awake Pain management: pain level controlled Vital Signs Assessment: post-procedure vital signs reviewed and stable Respiratory status: spontaneous breathing Cardiovascular status: stable Postop Assessment: no signs of nausea or vomiting Anesthetic complications: no    Last Vitals:  Filed Vitals:   11/01/15 2026 11/02/15 0600  BP: 157/90 124/83  Pulse: 84 88  Temp: 37.6 C 37.6 C  Resp: 18 18    Last Pain:  Filed Vitals:   11/02/15 0657  PainSc: 8                  [name]

## 2015-11-02 NOTE — Progress Notes (Addendum)
Nutrition Follow-up  DOCUMENTATION CODES:   Severe malnutrition in context of acute illness/injury  INTERVENTION:   -Continue MVI daily -30 ml Prostat daily, each supplement provides 100 kcals and 15 grams -Provided "Plate Method" handout with RD contact information at bedside  NUTRITION DIAGNOSIS:   Inadequate oral intake related to lethargy/confusion as evidenced by per patient/family report.  Progressing  GOAL:   Patient will meet greater than or equal to 90% of their needs  Progressing  MONITOR:   Diet advancement, Labs, Weight trends, Skin, I & O's  REASON FOR ASSESSMENT:   Low Braden    ASSESSMENT:   50 y.o. 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 Patient unable to provide history, therefore most is obtained from his girlfriend at the bed side. She states that the patient has been acting abnormally for the past 2 days and has gotten progressively worse. The patient takes Seroquel and per the girlfriend, he has not taken this for the past 5-6 days because there was an issue with his prescription refill. She also states the patient has been complaining of severe, worsening right foot pain that has cause him an enormous amount of discomfort resulting in crying spells and outbursts. She states that normally the patient is able to feed himself, bathe himself, and carry on a normal conversation, however, this has not been the case for the past 2-3 days. The patient denies  any complaints of recent fever, chills, nausea, vomiting, diarrhea, difficulty urinating, dizziness, or lightheadedness.   S/p Procedure(s) 10/31/15: AMPUTATION ABOVE KNEE (Right)   Pt sleeping soundly at time of visit. RD did not wake. Significant other not at bedside.   Case discussed with RN. She reports pt with good appetite, consumed 100% of breakfast. Noted meal completion improving; PO: 25-100%.   RN reports that pt has been very agitated and confused this shift. Not receptive to education at this time. RD left "Plate Method" handout on pt bedside with RD contact information.   Per RN, rt stump area healing well.  Per RN, plan to d/c to SNF vs CIR once stable.   Labs reviewed: CBGS: 130-147.   Diet Order:  Diet Carb Modified Fluid consistency:: Thin; Room service appropriate?: Yes  Skin:  Wound (see comment) (closed rt thigh incision)  Last BM:  11/02/15  Height:   Ht Readings from Last 1 Encounters:  10/25/15 6\' 2"  (1.88 m)    Weight:   Wt Readings from Last 1 Encounters:  10/25/15 177 lb 14.6 oz (80.7 kg)    Ideal Body Weight:  79.2 kg  BMI:  Body mass index is 22.83 kg/(m^2).  Estimated Nutritional Needs:   Kcal:  1900-2100  Protein:  100-115 grams  Fluid:  1.9-2.1 L  EDUCATION NEEDS:   Education needs no appropriate at this time  Bland Rudzinski A. Mayford KnifeWilliams, RD, LDN, CDE Pager: [phone] After hours Pager: [phone]

## 2015-11-02 NOTE — Progress Notes (Signed)
Subjective: Patient seen and examined this morning.  S/p right AKA post-op day #2. He was noted to have some confusion upon transferring yesterday to Med-Surg.  Mental status seems improved this morning, he does seem to be frustrated stating he can use the bathroom on his own.  Girlfriend this morning states that when he has had surgery in the past he has experienced confusion lasting 2-3 days and that he also has not tolerated morphine well in the past.  Awaiting placement options.   Objective: Vital signs in last 24 hours: Filed Vitals:   11/01/15 1500 11/01/15 1755 11/01/15 2026 11/02/15 0600  BP: 141/89 162/100 157/90 124/83  Pulse:  81 84 88  Temp: 98.3 F (36.8 C) 98.1 F (36.7 C) 99.6 F (37.6 C) 99.7 F (37.6 C)  TempSrc: Oral Oral Oral Oral  Resp: Height:      Weight:      SpO2: 97% 100% 100% 98%   Weight change:   Intake/Output Summary (Last 24 hours) at 11/02/15 1117 Last data filed at 11/01/15 1600  Gross per 24 hour  Intake    530 ml  Output    500 ml  Net     30 ml   Physical Exam General: sitting up in bed, not distressed, able to answer questions, appears mildly confused at times HEENT: Atraumatic, No scleral icuterus Pulmonary: breaths non-labored Extremities: Left AKA. Right AKA with dressing over amputation site.  No bleeding noted through bandage. Neurological: alert and oriented x 3, able to answer questions, appears frustrated  Lab Results: Basic Metabolic Panel:  Recent Labs Lab 11/01/15 0405 11/02/15 0556  NA 136 135  K 4.2 3.7  CL 104 100*  CO2 21* 22  GLUCOSE 141* 139*  BUN 11 14  CREATININE 1.18 1.19  CALCIUM 9.0 9.3   Liver Function Tests:  Recent Labs Lab 10/30/15 0400 10/31/15 0418  AST 48* 42*  ALT 93* 90*  ALKPHOS 96 96  BILITOT 0.4 0.4  PROT 7.3 7.7  ALBUMIN 2.1* 2.3*   CBC:  Recent Labs Lab 11/01/15 0405 11/02/15 0556  WBC 8.1 10.7*  HGB 8.7* 9.0*  HCT 27.2* 28.1*  MCV 78.6 79.8  PLT 336 337    Cardiac Enzymes:  Recent Labs Lab 10/30/15 0400  CKTOTAL 475*   CBG:  Recent Labs Lab 11/01/15 1120 11/01/15 1513 11/01/15 2018 11/02/15 0012 11/02/15 0416 11/02/15 0812  GLUCAP 217* 171* 144* 151* 130* 134*   Anemia Panel: No results for input(s): VITAMINB12, FOLATE, FERRITIN, TIBC, IRON, RETICCTPCT in the last 168 hours. Urine Drug Screen: Drugs of Abuse     Component Value Date/Time   LABOPIA POSITIVE* 10/25/2015 0800   COCAINSCRNUR POSITIVE* 10/25/2015 0800   LABBENZ POSITIVE* 10/25/2015 0800   AMPHETMU NONE DETECTED 10/25/2015 0800   THCU NONE DETECTED 10/25/2015 0800   LABBARB NONE DETECTED 10/25/2015 0800    Studies/Results: No results found. Medications: I have reviewed the patient's current medications. Scheduled Meds: . sodium chloride  10 mL/hr Intravenous Once  . aspirin EC  81 mg Oral Daily  . atenolol  50 mg Oral BID  . atorvastatin  40 mg Oral q1800  . buPROPion  100 mg Oral BID  . cloNIDine  0.1 mg Transdermal Weekly  . docusate  50 mg Oral Daily  . folic acid  1 mg Oral Daily  . hydrochlorothiazide  25 mg Oral Daily  . insulin aspart  0-9 Units Subcutaneous Q4H  . multivitamin with  minerals  1 tablet Oral Daily  . pantoprazole  40 mg Oral Daily  . QUEtiapine  600 mg Oral QHS  . rivaroxaban  20 mg Oral Q supper  . sodium chloride flush  3 mL Intravenous Q12H  . thiamine  100 mg Oral Daily   Continuous Infusions: . lactated ringers 10 mL/hr at 10/31/15 2058   PRN Meds:.alum & mag hydroxide-simeth, hydrALAZINE, ondansetron **OR** ondansetron (ZOFRAN) IV, oxyCODONE Assessment/Plan:  Altered Mental Status- improved overall but with some confusion overnight: likely contributed by pain medications, post-op, and change in room setting. - Haldol stopped.  Can consider restarting if patient has issues with agitation - increase Seroquel to home dose  - Continue Wellbutrin 100mg  BID - change scheduled oxycodone to prn - d/c morphine - q2h  neuro check - Thiamine, folate  Right Foot Wet Gangrene s/p AKA 5/3: Patient doing fairly well post-operatively, but did have some confusion as noted above yesterday  - d/c antibiotics - restart xarelto daily - dysphagia 2 diet - pain meds as above - PT recommending CIR at this point, will await their consultation  - CSW still following for possible SNF placement  Elevated AST, ALT mild, stable: Could be related alcohol abuse (although denies) and/or elevated CK 2/2 ischemic foot. ALT remains elevated 2/1 ratio.  - follow for now.   Normocytic Anemia: -hgb stable after surgery and PRBCs - follow CBC   T2DM:  On home insulin.- SSI  HTN: BP better  - continue atenolol 50mg  BID, clonidine patch 0.1mg  Q7Days, HCTZ 25mg  daily - continue hydralazine 10mg  IV prn.  - Follow BP  Elevated Creatine: stable - follow for now  DVT PPx: Heparin [place]  Dispo: Disposition is deferred at this time, awaiting improvement of current medical problems.    The patient does have a current PCP (Pcp Not In System) and [facility] after discharge.  The patient does have transportation limitations that hinder transportation to clinic appointments.  .Services Needed at time of discharge: Y = Yes, Blank = No PT:  SNF vs CIR  OT:  SNF  RN:   Equipment:   Other:     LOS: 8 days   Gwynn BurlyAndrew Alieah Brinton, DO 11/02/2015, 11:17 AM

## 2015-11-02 NOTE — Clinical Social Work Note (Signed)
Patient transferred from 3S to 445W. Will provide handoff information to 5W CSW.  This CSW signing off.  Charlynn CourtSarah Clide Remmers, CSW [phone]

## 2015-11-02 NOTE — Progress Notes (Signed)
CBG reading of >10 incorrect. CBG was rechecked and reading was 147.

## 2015-11-02 NOTE — Progress Notes (Signed)
       Patient is angry and not oriented to place.  Left AKA is healing well, stump is warm  S/P left AKA Stump is viable Retention sock in place Staples will remain for 4 weeks He may shower   Valerio Pinard MAUREEN PA-C

## 2015-11-03 LAB — GLUCOSE, CAPILLARY
GLUCOSE-CAPILLARY: 148 mg/dL — AB (ref 65–99)
GLUCOSE-CAPILLARY: 155 mg/dL — AB (ref 65–99)
Glucose-Capillary: 167 mg/dL — ABNORMAL HIGH (ref 65–99)
Glucose-Capillary: 155 mg/dL — ABNORMAL HIGH (ref 65–99)
Glucose-Capillary: 167 mg/dL — ABNORMAL HIGH (ref 65–99)

## 2015-11-03 NOTE — Discharge Instructions (Signed)
Information on my medicine - XARELTO (Rivaroxaban)  This medication education Why was Xarelto prescribed for you? Xarelto was prescribed for you to reduce the risk of a blood clot forming due to peripheral arterial disease.  What do you need to know about xarelto ? Take your Xarelto ONCE DAILY at the same time every day with your evening meal. If you have difficulty swallowing the tablet whole, you may crush it and mix in applesauce just prior to taking your dose.  Take Xarelto exactly as prescribed by your doctor and DO NOT stop taking Xarelto without talking to the doctor who prescribed the medication.  Stopping without other stroke prevention medication to take the place of Xarelto may increase your risk of developing a clot that causes a stroke.  Refill your prescription before you run out.  After discharge, you should have regular check-up appointments with your healthcare provider that is prescribing your Xarelto.  In the future your dose may need to be changed if your kidney function or weight changes by a significant amount.  What do you do if you miss a dose? If you are taking Xarelto ONCE DAILY and you miss a dose, take it as soon as you remember on the same day then continue your regularly scheduled once daily regimen the next day. Do not take two doses of Xarelto at the same time or on the same day.   Important Safety Information A possible side effect of Xarelto is bleeding. You should call your healthcare provider right away if you experience any of the following: ? Bleeding from an injury or your nose that does not stop. ? Unusual colored urine (red or dark brown) or unusual colored stools (red or black). ? Unusual bruising for unknown reasons. ? A serious fall or if you hit your head (even if there is no bleeding).  Some medicines may interact with Xarelto and might increase your risk of bleeding while on Xarelto. To help avoid this, consult your healthcare  provider or pharmacist prior to using any new prescription or non-prescription medications, including herbals, vitamins, non-steroidal anti-inflammatory drugs (NSAIDs) and supplements.  This website has more information on Xarelto: [email or web address].[email or web address].

## 2015-11-03 NOTE — Discharge Summary (Signed)
Name: [name] [name] MRN: [id number] DOB: 1965-12-30 50 y.o. PCP: Pcp Not In System  Date of Admission: 10/25/2015  5:31 AM Date of Discharge: 11/04/2015 Attending Physician: Inez Catalina, MD  Discharge Diagnosis: 1. Right Foot Wet Gangrene 2. Ischemic Right Foot 3. 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 Acute Encephalopathy  Principal Problem:   Acute delirium Active Problems:   Gangrene of foot (HCC)   Absolute anemia   Encephalopathy acute   Ischemic foot   Protein-calorie malnutrition, severe   Bipolar affective disorder, current episode hypomanic (HCC)  Discharge Medications:   Medication List    STOP taking these medications        ALPRAZolam 1 MG tablet  Commonly known as:  XANAX     amitriptyline 25 MG tablet  Commonly known as:  ELAVIL     amphetamine-dextroamphetamine 30 MG 24 hr capsule  Commonly known as:  ADDERALL XR     cloNIDine 0.1 MG tablet  Commonly known as:  CATAPRES  Replaced by:  cloNIDine 0.1 mg/24hr patch     traMADol 50 MG tablet  Commonly known as:  ULTRAM      TAKE these medications        aspirin 81 MG EC tablet  Take 1 tablet (81 mg total) by mouth daily.     atenolol 50 MG tablet  Commonly known as:  TENORMIN  Take 1 tablet (50 mg total) by mouth 2 (two) times daily.     atorvastatin 40 MG tablet  Commonly known as:  LIPITOR  Take 40 mg by mouth every morning.     buPROPion 100 MG tablet  Commonly known as:  WELLBUTRIN  Take 1 tablet (100 mg total) by mouth 2 (two) times daily.     cloNIDine 0.1 mg/24hr patch  Commonly known as:  CATAPRES - Dosed in mg/24 hr  Place 1 patch (0.1 mg total) onto the skin once a week.     docusate 50 MG/5ML liquid  Commonly known as:  COLACE  Take 5 mLs (50 mg total) by mouth daily.     feeding supplement (PRO-STAT SUGAR FREE 64) Liqd  Take 30 mLs by mouth daily.     gabapentin 300 MG capsule  Commonly known as:  NEURONTIN  Take 1 capsule (300 mg total) by mouth 3 (three) times  daily.     glipiZIDE 10 MG tablet  Commonly known as:  GLUCOTROL  Take 10 mg by mouth daily.     hydrochlorothiazide 50 MG tablet  Commonly known as:  HYDRODIURIL  Take 0.5 tablets (25 mg total) by mouth daily.     insulin aspart protamine- aspart (70-30) 100 UNIT/ML injection  Commonly known as:  NOVOLOG MIX 70/30  Inject 0-14 Units into the skin See admin instructions. 150-199=4 units 200-249=6 units 250-299=8 units 300-349=12 units 350-400=14 units     insulin glargine 100 UNIT/ML injection  Commonly known as:  LANTUS  Inject 0.1 mLs (10 Units total) into the skin at bedtime.     lisinopril 20 MG tablet  Commonly known as:  PRINIVIL,ZESTRIL  Take 20 mg by mouth 2 (two) times daily.     metFORMIN 850 MG tablet  Commonly known as:  GLUCOPHAGE  Take 850 mg by mouth 3 (three) times daily.     multivitamin with minerals Tabs tablet  Take 1 tablet by mouth daily.     nicotine 14 mg/24hr patch  Commonly known as:  NICODERM CQ - dosed in mg/24 hours  Place 1 patch (14 mg total) onto the skin daily.     omeprazole 20 MG capsule  Commonly known as:  PRILOSEC  Take 20 mg by mouth 2 (two) times daily before a meal.     oxyCODONE 5 MG immediate release tablet  Commonly known as:  Oxy IR/ROXICODONE  Take 1 tablet (5 mg total) by mouth every 6 (six) hours as needed for severe pain or breakthrough pain.     potassium chloride 10 MEQ tablet  Commonly known as:  K-DUR  Take 10 mEq by mouth daily.     rivaroxaban 20 MG Tabs tablet  Commonly known as:  XARELTO  Take 1 tablet (20 mg total) by mouth daily with supper. Please start after you finish your xarelto starter pack.     SEROQUEL XR 400 MG 24 hr tablet  Generic drug:  QUEtiapine  Take 800 mg by mouth at bedtime.        Disposition and follow-up:   [name] was discharged from [facility] in Fair condition.    1.  At the hospital follow up visit please address:  - ensure patient has f/u with  vascular surgery - ensure patient has PCP and mental health follow up - mental status - make sure patient has appropriate home health services and equipment  Wound care: routing post surgical wound care. Retention sock. Dry dressing changes.  We have reduced his lantus from 40 to 10 units as his CBG were under 200 here in the hospital on SSI.   We d/ced his xanax, tramadol (since currently on oxycodone), amitriptyline, and adderall in the hospital. He likely would benefit from reducing his medication burden. Add these back if needed in the future.   2.  Labs / imaging needed at time of follow-up: CBC, CMET in 1 week then at hospital follow up.  3.  Pending labs/ test needing follow-up: none  Follow-up Appointments: Follow-up Information    Call [facility].   Why:  Please schedule outpatient Physical Therapy and Occupational Therapy.    Contact information:   [address]  [address] [place], [place] [address]  [phone]      Follow up with [facility].   Why:  Home health nurse, aide, and social work; they will call you in 1-2 days to arrange visit.    Contact information:   [address] [place] [address] [phone]       Discharge Instructions: Discharge Instructions    Diet - low sodium heart healthy    Complete by:  As directed      Increase activity slowly    Complete by:  As directed            Consultations: Treatment Team:  [name], MD Leata Mouse, MD  Procedures Performed:  Ct Head Wo Contrast  10/25/2015  CLINICAL DATA:  Acute mental status changes, hypertension, peripheral vascular disease, remote left frontal infarct EXAM: CT HEAD WITHOUT CONTRAST TECHNIQUE: Contiguous axial images were obtained from the base of the skull through the vertex without contrast. COMPARISON:  08/13/2011 FINDINGS: Encephalomalacia in the left high frontal lobe from a remote left MCA territory  infarct. No acute intracranial hemorrhage, mass lesion, new infarction, midline shift, herniation, hydrocephalus, or extra-axial fluid collection. Remote right occipital PCA territory infarct also noted, image 19. Cisterns remain patent. No cerebellar abnormality. Orbits appear symmetric. Polypoid mucosal thickening in the maxillary sinuses bilaterally. Other sinuses remain clear.  Left mastoid effusion noted. Right mastoid is clear. IMPRESSION: Areas of remote infarct in the left frontal and right occipital lobes. No acute intracranial finding by noncontrast CT Polypoid bilateral maxillary sinus disease. Left mastoid effusion versus mastoiditis. Electronically Signed   By: Judie Petit.  Shick M.D.   On: 10/25/2015 07:54   Dg Abd Portable 1v  10/27/2015  CLINICAL DATA:  Patient with severe abdominal pain, left lower quadrant. EXAM: PORTABLE ABDOMEN - 1 VIEW COMPARISON:  CT abdomen pelvis 11/21/2014. FINDINGS: Lung bases are clear. Gas is demonstrated within nondilated loops of large and small bowel in a nonobstructed pattern. Supine evaluation limited for the detection of free intraperitoneal air. Unremarkable osseous skeleton. IMPRESSION: Nonobstructed bowel gas pattern. Electronically Signed   By: Annia Belt M.D.   On: 10/27/2015 10:26   Dg Foot Complete Right  10/25/2015  CLINICAL DATA:  Pain and infection in the right foot. EXAM: RIGHT FOOT COMPLETE - 3+ VIEW COMPARISON:  None. FINDINGS: Status post great toe amputation at the proximal phalanx. Bone fragments at the osteotomy are likely postsurgical. No definitive erosion from osteomyelitis. No opaque foreign body or soft tissue gas. First MTP osteoarthritis. IMPRESSION: Partial great toe amputation. Associated bone fragments are likely postsurgical - no definitive osteomyelitis. No soft tissue gas. Electronically Signed   By: Marnee Spring M.D.   On: 10/25/2015 06:20    Admission HPI:  Mr. Bishop Vanderwerf is a 50 y.o. 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 Patient unable to provide history, therefore most is obtained from his girlfriend at the bed side. She states that the patient has been acting abnormally for the past 2 days and has gotten progressively worse. The patient takes Seroquel and per the girlfriend, he has not taken this for the past 5-6 days because there was an issue with his prescription refill. She also states the patient has been complaining of severe, worsening right foot pain that has cause him an enormous amount of discomfort resulting in crying spells and outbursts. She states that normally the patient is able to feed himself, bathe himself, and carry on a normal conversation, however, this has not been the case for the past 2-3 days. The patient denies any complaints of recent fever, chills, nausea, vomiting, diarrhea, difficulty urinating, dizziness, or lightheadedness.   Of note, patient was admitted to the hospital on 10/02/15 after the patient had held his Xarelto because he was supposed to have several dental extractions. He apparently developed severe foot pain and was found to have clotted off his axbifem graft and underwent a thrombectomy for this. Per chart review, AKA was recommended given that the patient continued to have no pulses in the distal right extremity, however, he refused amputation. Patient was discharged on 10/05/15. He then followed up with Dr. Darrick Penna (vascular surgery) who felt his foot was showing early gangrenous  changes and once again recommended that he receive an AKA given the severity of his limb ischemia. Despite prolonged discussion and patient understanding of his situation, he continued to refuse AKA. He was informed that if the foot progressed  to wet gangrene, it could be life threatening.   Hospital Course by problem list: Principal Problem:   Acute delirium Active Problems:   Gangrene of foot (HCC)   Absolute anemia   Encephalopathy acute   Ischemic foot   Protein-calorie malnutrition, severe   Bipolar affective disorder, current episode hypomanic (HCC)   Acute Encephalopathy: Suspect this was multifactorial, most likely related to critical limb ischemia, substance abuse, and non-compliance with Seroquel for Bipolar Disorder. On admission, patient only able to follow some simple commands, intermittently grunting, crying, and sometimes able to answer simple questions. Seemed to be in significant pain. Has a mild leukocytosis likely related to gangrene. Vitals stable, mildly tachycardic at times. Per the girlfriend, patient had been without his Seroquel for 5 days or so due to a prescription problem and they were unable to receive a refill. Typically takes 800 mg qhs for this. Patient also found to be cocaine positive on UDS. He received Haldol and Ativan prn for agitation and his Seroquel was restarted at a lower dose with continued titration until we were able to reach his home dose.  With his clearing mental status, we discontinued Haldol and Ativan.  Neuro checks were continued, thiamine and folate also continued.  He did have some increased confusion after his surgery that was thought to be secondary to his pain medications and anesthesia.  We discontinued his morphine and continued oxycodone as needed.  His mental status improved again but patient did have some agitation.  Currently he is stable on seroquel. D/ced Aderral. D/ced xanax and amitriptyline along with tramadol as he is on oxycodone  for now. Would greatly benefit from medication burden reduction.   Right Foot Gangrene: Patient VERY complicated vascular situation as discussed at length in HPI. Multiple previous procedures for limb salvage. Already with LLE AKA prior to this admission. Also, recent admission for right axillary bifem thrombectomy. Patient continued to have no pulses in the right foot but refused AKA. Followed up with [facility] on 10/18/15 and was noted to have gangrenous changes in the foot and once again, AKA was recommended which he continued to adamantly refuse. Patient with severe gangrenous changes in the foot extending up the leg on this admission. He was started on empiric antibiotics which were continued until 24 hours post-op.  His infection resulted in Right AKA which patient consented for after his mental status cleared.  He is now stable. Should f/up with vascular surgery outpatient.   Right AKA: secondary to Right Foot Gangrene as described above.  Anemia: Patient with Hb of 6, baseline ~10. Unclear for recent decrease in Hb, but appears to be related to chronic disease. Retics 4.4, Ferritin 212, B12 1127. FOBT negative. No signs of bleeding, although may be somewhat related to recent vascular procedure. Was given a unit on admission and given units at time of surgery.  Otherwise, stable.  HTN: Normotensive on admission. Takes Atenolol, HCTZ, Clonidine, Lisinopril at home.  As patient became more hypertensive, we restarted his home medications.  DM II - at home he is on lantus 40 units + SSI novolog.  - his CBG here has been stable under 200 mostly. Reduced discharge lantus to 10 units daily along with continuing SSI. This can be titrated further on follow up.   Discharge Vitals:   BP 127/80 mmHg  Pulse 88  Temp(Src) 97.4 F (36.3 C) (Oral)  Resp 16  Ht 6\' 2"  (1.88 m)  Wt 177 lb 14.6 oz (80.7 kg)  BMI 22.83  kg/m2  SpO2 100%  Discharge Labs:  Results for orders placed or performed during the hospital  encounter of 10/25/15 (from the past 24 hour(s))  Glucose, capillary     Status: Abnormal   Collection Time: 11/03/15  4:22 PM  Result Value Ref Range   Glucose-Capillary 167 (H) 65 - 99 mg/dL  Glucose, capillary     Status: Abnormal   Collection Time: 11/03/15 10:11 PM  Result Value Ref Range   Glucose-Capillary 155 (H) 65 - 99 mg/dL   Comment 1 Notify RN    Comment 2 Document in Chart   CBC     Status: Abnormal   Collection Time: 11/04/15  5:46 AM  Result Value Ref Range   WBC 6.9 4.0 - 10.5 K/uL   RBC 3.35 (L) 4.22 - 5.81 MIL/uL   Hemoglobin 8.2 (L) 13.0 - 17.0 g/dL   HCT 16.1 (L) 09.6 - 04.5 %   MCV 79.1 78.0 - 100.0 fL   MCH 24.5 (L) 26.0 - 34.0 pg   MCHC 30.9 30.0 - 36.0 g/dL   RDW 40.9 (H) 81.1 - 91.4 %   Platelets 422 (H) 150 - 400 K/uL  Comprehensive metabolic panel     Status: Abnormal   Collection Time: 11/04/15  5:46 AM  Result Value Ref Range   Sodium 136 135 - 145 mmol/L   Potassium 3.4 (L) 3.5 - 5.1 mmol/L   Chloride 100 (L) 101 - 111 mmol/L   CO2 22 22 - 32 mmol/L   Glucose, Bld 160 (H) 65 - 99 mg/dL   BUN 26 (H) 6 - 20 mg/dL   Creatinine, Ser 7.82 (H) 0.61 - 1.24 mg/dL   Calcium 8.9 8.9 - 95.6 mg/dL   Total Protein 7.4 6.5 - 8.1 g/dL   Albumin 2.2 (L) 3.5 - 5.0 g/dL   AST 83 (H) 15 - 41 U/L   ALT 128 (H) 17 - 63 U/L   Alkaline Phosphatase 85 38 - 126 U/L   Total Bilirubin 0.5 0.3 - 1.2 mg/dL   GFR calc non Af Amer >60 >60 mL/min   GFR calc Af Amer >60 >60 mL/min   Anion gap 14 5 - 15  Glucose, capillary     Status: Abnormal   Collection Time: 11/04/15  8:31 AM  Result Value Ref Range   Glucose-Capillary 225 (H) 65 - 99 mg/dL  Glucose, capillary     Status: Abnormal   Collection Time: 11/04/15 12:07 PM  Result Value Ref Range   Glucose-Capillary 140 (H) 65 - 99 mg/dL    Signed: Hyacinth Meeker, MD 11/04/2015, 3:16 PM    Services Ordered on Discharge: home HH rn, aide, sw, PT.  Equipment Ordered on Discharge: none

## 2015-11-03 NOTE — Progress Notes (Signed)
Subjective: Patient seen and examined this morning.  S/p right AKA post-op day #3.  His confusion appears to have improved from yesterday.  He is more agitated this morning but is alert and oriented.  Wants to go home if cannot go to CIR today.  Stable for CIR once bed available.  Objective: Vital signs in last 24 hours: Filed Vitals:   11/02/15 1550 11/02/15 2210 11/03/15 0554 11/03/15 1013  BP: 140/80 119/71 119/78 113/79  Pulse: 83 88 82 83  Temp: 98.2 F (36.8 C) 97.4 F (36.3 C) 97.5 F (36.4 C)   TempSrc: Oral     Resp: Height:      Weight:      SpO2: 99% 100% 100%    Weight change:   Intake/Output Summary (Last 24 hours) at 11/03/15 1154 Last data filed at 11/03/15 4098  Gross per 24 hour  Intake      0 ml  Output    300 ml  Net   -300 ml   Physical Exam General: sitting up in bed, not distressed, able to answer questions, mildly angry HEENT: Atraumatic, No scleral icuterus Pulmonary: breaths non-labored Extremities: Left AKA. Right AKA with dressing over amputation site.  No bleeding noted through bandage. Neurological: alert and oriented x 3, able to answer questions, appears frustrated  Lab Results: Basic Metabolic Panel:  Recent Labs Lab 11/01/15 0405 11/02/15 0556  NA 136 135  K 4.2 3.7  CL 104 100*  CO2 21* 22  GLUCOSE 141* 139*  BUN 11 14  CREATININE 1.18 1.19  CALCIUM 9.0 9.3   Liver Function Tests:  Recent Labs Lab 10/30/15 0400 10/31/15 0418  AST 48* 42*  ALT 93* 90*  ALKPHOS 96 96  BILITOT 0.4 0.4  PROT 7.3 7.7  ALBUMIN 2.1* 2.3*   CBC:  Recent Labs Lab 11/01/15 0405 11/02/15 0556  WBC 8.1 10.7*  HGB 8.7* 9.0*  HCT 27.2* 28.1*  MCV 78.6 79.8  PLT 336 337   Cardiac Enzymes:  Recent Labs Lab 10/30/15 0400  CKTOTAL 475*   CBG:  Recent Labs Lab 11/02/15 0812 11/02/15 1242 11/02/15 1253 11/02/15 1733 11/02/15 2053 11/03/15 0743  GLUCAP 134* <10* 147* 197* 148* 167*   Anemia Panel: No results  for input(s): VITAMINB12, FOLATE, FERRITIN, TIBC, IRON, RETICCTPCT in the last 168 hours. Urine Drug Screen: Drugs of Abuse     Component Value Date/Time   LABOPIA POSITIVE* 10/25/2015 0800   COCAINSCRNUR POSITIVE* 10/25/2015 0800   LABBENZ POSITIVE* 10/25/2015 0800   AMPHETMU NONE DETECTED 10/25/2015 0800   THCU NONE DETECTED 10/25/2015 0800   LABBARB NONE DETECTED 10/25/2015 0800    Studies/Results: No results found. Medications: I have reviewed the patient's current medications. Scheduled Meds: . sodium chloride  10 mL/hr Intravenous Once  . aspirin EC  81 mg Oral Daily  . atenolol  50 mg Oral BID  . atorvastatin  40 mg Oral q1800  . buPROPion  100 mg Oral BID  . cloNIDine  0.1 mg Transdermal Weekly  . docusate  50 mg Oral Daily  . feeding supplement (PRO-STAT SUGAR FREE 64)  30 mL Oral Daily  . folic acid  1 mg Oral Daily  . hydrochlorothiazide  25 mg Oral Daily  . insulin aspart  0-5 Units Subcutaneous QHS  . insulin aspart  0-9 Units Subcutaneous TID WC  . multivitamin with minerals  1 tablet Oral Daily  . nicotine  14 mg Transdermal Daily  .  pantoprazole  40 mg Oral Daily  . QUEtiapine  800 mg Oral QHS  . rivaroxaban  20 mg Oral Q supper  . sodium chloride flush  3 mL Intravenous Q12H  . thiamine  100 mg Oral Daily   Continuous Infusions: . lactated ringers 10 mL/hr at 10/31/15 2058   PRN Meds:.alum & mag hydroxide-simeth, hydrALAZINE, ondansetron **OR** ondansetron (ZOFRAN) IV, oxyCODONE Assessment/Plan:  Altered Mental Status- improved: likely contributed by pain medications, post-op, and change in room setting. - Haldol stopped.  Can consider restarting if patient has issues with agitation - Continue Seroquel at home dose  - Continue Wellbutrin 100mg  BID - Continue oxycodone prn - q2h neuro check - Thiamine, folate  Right Foot Wet Gangrene s/p AKA 5/3: Patient doing fairly well post-operatively, confusion is improved - continue xarelto - pain meds as  above - PT recommending CIR at this point, awaiting CIR bed availability - CSW still following for possible SNF placement  Elevated AST, ALT mild, stable: Could be related alcohol abuse (although denies) and/or elevated CK 2/2 ischemic foot. ALT remains elevated 2/1 ratio.  - follow for now.   Normocytic Anemia: -hgb stable after surgery and PRBCs - follow CBC   T2DM:  On home insulin.- SSI  HTN: BP stable - continue atenolol 50mg  BID, clonidine patch 0.1mg  Q7Days, HCTZ 25mg  daily - continue hydralazine 10mg  IV prn.  - Follow BP  Elevated Creatine: stable - follow for now  DVT PPx: Heparin [place]  Diet: Carb mod  Dispo: Stable for d/c CIR pending bed availability.    The patient does have a current PCP (Pcp Not In System) and [facility].  The patient does have transportation limitations that hinder transportation to clinic appointments.  .Services Needed at time of discharge: Y = Yes, Blank = No PT:  CIR  OT:  CIR  RN:   Equipment:   Other:     LOS: 9 days   Gwynn BurlyAndrew Maryjane Benedict, DO 11/03/2015, 11:54 AM

## 2015-11-04 LAB — COMPREHENSIVE METABOLIC PANEL
ALK PHOS: 85 U/L (ref 38–126)
ALT: 128 U/L — ABNORMAL HIGH (ref 17–63)
ANION GAP: 14 (ref 5–15)
AST: 83 U/L — ABNORMAL HIGH (ref 15–41)
Albumin: 2.2 g/dL — ABNORMAL LOW (ref 3.5–5.0)
BILIRUBIN TOTAL: 0.5 mg/dL (ref 0.3–1.2)
BUN: 26 mg/dL — AB (ref 6–20)
CALCIUM: 8.9 mg/dL (ref 8.9–10.3)
CO2: 22 mmol/L (ref 22–32)
Chloride: 100 mmol/L — ABNORMAL LOW (ref 101–111)
Creatinine, Ser: 1.33 mg/dL — ABNORMAL HIGH (ref 0.61–1.24)
GFR calc Af Amer: >60 mL/min (ref >60)
Glucose, Bld: 160 mg/dL — ABNORMAL HIGH (ref 65–99)
Potassium: 3.4 mmol/L — ABNORMAL LOW (ref 3.5–5.1)
Sodium: 136 mmol/L (ref 135–145)
TOTAL PROTEIN: 7.4 g/dL (ref 6.5–8.1)

## 2015-11-04 LAB — CBC
HEMATOCRIT: 26.5 % — AB (ref 39.0–52.0)
Hemoglobin: 8.2 g/dL — ABNORMAL LOW (ref 13.0–17.0)
MCH: 24.5 pg — ABNORMAL LOW (ref 26.0–34.0)
MCHC: 30.9 g/dL (ref 30.0–36.0)
MCV: 79.1 fL (ref 78.0–100.0)
Platelets: 422 10*3/uL — ABNORMAL HIGH (ref 150–400)
RBC: 3.35 MIL/uL — ABNORMAL LOW (ref 4.22–5.81)
RDW: 16.1 % — AB (ref 11.5–15.5)
WBC: 6.9 10*3/uL (ref 4.0–10.5)

## 2015-11-04 LAB — GLUCOSE, CAPILLARY
GLUCOSE-CAPILLARY: 225 mg/dL — AB (ref 65–99)
GLUCOSE-CAPILLARY: 140 mg/dL — AB (ref 65–99)

## 2015-11-04 MED ORDER — NICOTINE 14 MG/24HR TD PT24: 14.0000 mg | MEDICATED_PATCH | Freq: Every day | TRANSDERMAL | Status: DC

## 2015-11-04 MED ORDER — DOCUSATE SODIUM 50 MG/5ML PO LIQD: 50.0000 mg | Freq: Every day | ORAL | Status: DC

## 2015-11-04 MED ORDER — INSULIN GLARGINE 100 UNIT/ML ~~LOC~~ SOLN: 10.0000 [IU] | Freq: Every day | SUBCUTANEOUS | Status: AC

## 2015-11-04 MED ORDER — POTASSIUM CHLORIDE CRYS ER 20 MEQ PO TBCR
40.0000 meq | EXTENDED_RELEASE_TABLET | Freq: Once | ORAL | Status: AC
  Administered 2015-11-04: 40 meq via ORAL
  Filled 2015-11-04: qty 2

## 2015-11-04 MED ORDER — DOCUSATE SODIUM 50 MG/5ML PO LIQD: 50.0000 mg | Freq: Every day | ORAL | Status: AC

## 2015-11-04 MED ORDER — CLONIDINE HCL 0.1 MG/24HR TD PTWK: 0.1000 mg | MEDICATED_PATCH | TRANSDERMAL | Status: AC

## 2015-11-04 MED ORDER — CLONIDINE HCL 0.1 MG/24HR TD PTWK: 0.1000 mg | MEDICATED_PATCH | TRANSDERMAL | Status: DC

## 2015-11-04 MED ORDER — OXYCODONE HCL 5 MG PO TABS: 5.0000 mg | ORAL_TABLET | Freq: Four times a day (QID) | ORAL | Status: DC | PRN

## 2015-11-04 MED ORDER — NICOTINE 14 MG/24HR TD PT24: 14.0000 mg | MEDICATED_PATCH | Freq: Every day | TRANSDERMAL | Status: AC

## 2015-11-04 MED ORDER — PRO-STAT SUGAR FREE PO LIQD: 30.0000 mL | Freq: Every day | ORAL | Status: AC

## 2015-11-04 MED ORDER — OXYCODONE HCL 5 MG PO TABS: 5.0000 mg | ORAL_TABLET | Freq: Four times a day (QID) | ORAL | Status: AC | PRN

## 2015-11-04 MED ORDER — INSULIN GLARGINE 100 UNIT/ML ~~LOC~~ SOLN: 20.0000 [IU] | Freq: Every day | SUBCUTANEOUS | Status: DC

## 2015-11-04 MED ORDER — PRO-STAT SUGAR FREE PO LIQD: 30.0000 mL | Freq: Every day | ORAL | Status: DC

## 2015-11-04 NOTE — Progress Notes (Signed)
MD aware of family having questions. To come answer questions at bedside

## 2015-11-04 NOTE — Progress Notes (Signed)
Subjective: Patient seen and examined this morning. No confusion currently. Looking forward to go to the rehab. Requesting to sit in wheel chair with assistance when his girlfriend gets here.   Stable for CIR once bed available.  Objective: Vital signs in last 24 hours: Filed Vitals:   11/03/15 1013 11/03/15 1336 11/03/15 2213 11/04/15 0512  BP: 113/79 124/67 119/94 115/80  Pulse: 83 88 85 81  Temp:  99 F (37.2 C) 98.2 F (36.8 C) 98.1 F (36.7 C)  TempSrc:   Oral Oral  Resp:  18 16 16   Height:      Weight:      SpO2:  100% 97% 95%   Weight change:   Intake/Output Summary (Last 24 hours) at 11/04/15 0843 Last data filed at 11/04/15 0514  Gross per 24 hour  Intake      0 ml  Output    950 ml  Net   -950 ml   Physical Exam General: sitting up in bed, not distressed, able to answer questions, pleasant this morning.  HEENT: Atraumatic, No scleral icuterus Pulmonary: breaths non-labored Extremities: Left AKA. Right AKA with dressing over amputation site.  No bleeding noted through bandage. Neurological: alert and oriented x 3, able to answer questions, appears frustrated  Lab Results: Basic Metabolic Panel:  Recent Labs Lab 11/02/15 0556 11/04/15 0546  NA 135 136  K 3.7 3.4*  CL 100* 100*  CO2 22 22  GLUCOSE 139* 160*  BUN 14 26*  CREATININE 1.19 1.33*  CALCIUM 9.3 8.9   Liver Function Tests:  Recent Labs Lab 10/31/15 0418 11/04/15 0546  AST 42* 83*  ALT 90* 128*  ALKPHOS 96 85  BILITOT 0.4 0.5  PROT 7.7 7.4  ALBUMIN 2.3* 2.2*   CBC:  Recent Labs Lab 11/02/15 0556 11/04/15 0546  WBC 10.7* 6.9  HGB 9.0* 8.2*  HCT 28.1* 26.5*  MCV 79.8 79.1  PLT 337 422*   Cardiac Enzymes:  Recent Labs Lab 10/30/15 0400  CKTOTAL 475*   CBG:  Recent Labs Lab 11/02/15 1733 11/02/15 2053 11/03/15 0743 11/03/15 1220 11/03/15 1622 11/03/15 2211  GLUCAP 197* 148* 167* 155* 167* 155*   Anemia Panel: No results for input(s): VITAMINB12, FOLATE,  FERRITIN, TIBC, IRON, RETICCTPCT in the last 168 hours. Urine Drug Screen: Drugs of Abuse     Component Value Date/Time   LABOPIA POSITIVE* 10/25/2015 0800   COCAINSCRNUR POSITIVE* 10/25/2015 0800   LABBENZ POSITIVE* 10/25/2015 0800   AMPHETMU NONE DETECTED 10/25/2015 0800   THCU NONE DETECTED 10/25/2015 0800   LABBARB NONE DETECTED 10/25/2015 0800    Studies/Results: No results found. Medications: I have reviewed the patient's current medications. Scheduled Meds: . sodium chloride  10 mL/hr Intravenous Once  . aspirin EC  81 mg Oral Daily  . atenolol  50 mg Oral BID  . atorvastatin  40 mg Oral q1800  . buPROPion  100 mg Oral BID  . cloNIDine  0.1 mg Transdermal Weekly  . docusate  50 mg Oral Daily  . feeding supplement (PRO-STAT SUGAR FREE 64)  30 mL Oral Daily  . folic acid  1 mg Oral Daily  . hydrochlorothiazide  25 mg Oral Daily  . insulin aspart  0-5 Units Subcutaneous QHS  . insulin aspart  0-9 Units Subcutaneous TID WC  . multivitamin with minerals  1 tablet Oral Daily  . nicotine  14 mg Transdermal Daily  . pantoprazole  40 mg Oral Daily  . potassium chloride  40 mEq  Oral Once  . QUEtiapine  800 mg Oral QHS  . rivaroxaban  20 mg Oral Q supper  . sodium chloride flush  3 mL Intravenous Q12H  . thiamine  100 mg Oral Daily   Continuous Infusions: . lactated ringers 10 mL/hr at 10/31/15 2058   PRN Meds:.alum & mag hydroxide-simeth, hydrALAZINE, ondansetron **OR** ondansetron (ZOFRAN) IV, oxyCODONE Assessment/Plan:  Altered Mental Status- improved likely at baseline now- was initially likely 2/2 to missing psych med dosing + poly substance use + infection then had some improvement before surgery. Then again had mild confusion likely 2/2 to pain medications, post-op, and change in room setting. 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 ALT remains elevated 2/1 ratio.  - follow for now.   Acute worsening of chronic Normocytic Anemia - likely post op bleeding: -hgb slightly decreased today - follow CBC. Monitor for bleeding.   T2DM:  On home insulin.- SSI  HTN: BP stable - continue atenolol  BID, clonidine patch 0.1mg  Q7Days, HCTZ  daily - Follow BP  DVT PPx: xarelto  Diet: Carb mod  Dispo: Stable for d/c CIR pending bed availability.    The patient does have a current PCP (Pcp Not In System) and does not need an [facility] hospital follow-up appointment after discharge.  The patient does have transportation limitations that hinder transportation to clinic appointments.  .Services Needed at time of discharge: Y = Yes, Blank = No PT:  CIR  OT:  CIR  RN:   Equipment:   Other:     LOS: 10 days   Hyacinth Meeker, MD 11/04/2015, 8:43 AM

## 2015-11-04 NOTE — Progress Notes (Signed)
Case management notified of need for home health.

## 2015-11-04 NOTE — Progress Notes (Signed)
Pt states he is going home and refuses rehab. Dr. Tasia CatchingsAhmed made aware; to come assess patient.

## 2015-11-04 NOTE — Clinical Social Work Note (Signed)
Clinical Social Worker met with patient at bedside to further discuss placement options.  Patient adamantly refuses SNF placement, stating that he will be returning home with his "old lady."  CSW was able to convince patient to allow CSW to contact his significant other of 15 years who is in agreement with patient return home as long as home health is arranged prior to discharge.  CSW updated RN and CM about patient wishes.  CSW also contacted PT in attempts to have patient work with slide board transfers prior to discharge.    Clinical Social Worker will sign off for now as social work intervention is no longer needed. Please consult [place] again if new need arises.  Barbette Or, [place]

## 2015-11-04 NOTE — Progress Notes (Signed)
Went to talk to the patient and the girl friend at 3 different occasions total today and spent over 30 mins trying to convince them that patient would benefit from SNF with his bilateral leg amputation.   Patient adamantly does not want to go to SNF. He wants to go home. He kept repeating that he did fine after last leg amputation (al though I did explain that was not bilateral). He wants to go home with home health.   Strongly advised him to go to SNF but patient is not willing to change his decision.  Will d/c home with home health referral. Asked him to f/up closely with his PCP Dr. Mayford KnifeWilliams and with vascular surgery.

## 2015-11-04 NOTE — Care Management Note (Addendum)
Case Management Note  Patient Details  Name: [name] LauthRobert [name] MRN: [id number] Date of Birth: Jun 09, 1966  Subjective/Objective:                  Foot pain, altered mental status  Action/Plan: CM spoke to patient at the bedside who per note is confused at times But currently a&o. Patient said that he wanted to go home and no longer wanted to go to SNF or CIR. While CM at the bedside, patient's Significant other [name] [name] called and patient handed the phone to the CM; significant other stating that she is prepared to take the patient home since that is what he wants and that she already spoke to him about it. CM explained safety measures to [facility] and the patient needing someone 24/7 with him and she said that she would be able to provide that. CM spoke to RN and asked that she call the provider to ensure he was aware and that if patient is going home, [facility], Aide, and SW orders along with outpatient PT/OT orders which were faxed to the neurorehab center with confirmation page received. CM notes that RN called MD who is planning to go to bedside to speak with patient about declining CIR and SNF.  Sliding board also ordered with [facility]. Cm offered patient choice of HH services and patient and significant other chose [facility]. CM called T[facility] with [facility] but could not accept referral for PT/OT as he does not qualify under terms of  Medicaid. CM still awaiting MD orders after he speaks with patient. CM remains available should additional needs arise. [facility] would like specific directions for wound care if routine surgical site care is not sufficient. CM will attempt contact with Discharging MD to have directions placed in D/C summary.   PCP per Significant other is Dr. Lerry Linerwight [place] in [place] @ [phone].  Expected Discharge Date:                  Expected Discharge Plan:  Home w Home Health Services  In-House Referral:  Clinical Social Work  Discharge  planning Services  CM Consult  Post Acute Care Choice:  Durable Medical Equipment, Home Health Choice offered to:  Patient, Spouse  DME Arranged:   Acupuncturist(Sliding Board/Transfer Board) DME Agency:  [facility]  HH Arranged:  RN, Social Work, Nurse's Aide (Outpatient PT/OT) HH Agency:  [facility]  Status of Service:  Completed, signed off  Medicare Important Message Given:    Date Medicare IM Given:    Medicare IM give by:    Date Additional Medicare IM Given:    Additional Medicare Important Message give by:     If discussed at Long Length of Stay Meetings, dates discussed:    Additional Comments:  Darcel SmallingAnna C Mountcastle, RN 11/04/2015, 2:10 PM

## 2015-11-04 NOTE — Progress Notes (Signed)
Discharge instructions given and patient and family verbalized understanding. Per family, case management called patient in the room  plans have been made for DME.

## 2015-11-05 ENCOUNTER — Telehealth: Payer: Self-pay | Admitting: Vascular Surgery

## 2015-11-05 NOTE — Telephone Encounter (Signed)
-----   Message from Phillips Odorarol S Pullins, RN sent at 11/05/2015 10:12 AM EDT ----- Regarding: needs 4 wk. f/u with Dr. Darrick PennaFields S/p right AKA 10/31/15  ----- Message -----    From: Dara LordsSamantha J Rhyne, PA-C    Sent: 11/05/2015   7:34 AM      To: [facility] Charge Pool  S/p right AKA.  F/u with Dr. Darrick PennaFields 4 weeks from surgery date.  Thanks, Lelon MastSamantha

## 2015-11-05 NOTE — Telephone Encounter (Signed)
Pt already has an appt on 6/1 at 10:45 with [facility].

## 2015-11-15 ENCOUNTER — Ambulatory Visit: Payer: Medicaid Other | Admitting: Occupational Therapy

## 2015-11-15 ENCOUNTER — Ambulatory Visit: Payer: Medicaid Other | Admitting: Physical Therapy

## 2015-11-27 ENCOUNTER — Encounter: Payer: Self-pay | Admitting: Vascular Surgery

## 2015-11-29 ENCOUNTER — Ambulatory Visit: Payer: Medicaid Other | Admitting: Vascular Surgery

## 2015-12-06 ENCOUNTER — Encounter: Payer: Self-pay | Admitting: Vascular Surgery

## 2015-12-12 ENCOUNTER — Ambulatory Visit (INDEPENDENT_AMBULATORY_CARE_PROVIDER_SITE_OTHER): Payer: Self-pay | Admitting: Vascular Surgery

## 2015-12-12 VITALS — BP 106/65 | HR 94 | Temp 100.5°F | Resp 18

## 2015-12-12 DIAGNOSIS — I739 Peripheral vascular disease, unspecified: Secondary | ICD-10-CM

## 2015-12-13 ENCOUNTER — Telehealth: Payer: Self-pay | Admitting: *Deleted

## 2015-12-13 NOTE — Telephone Encounter (Signed)
[name] [name] called stating patient had been to [facility] 12/12/15 and patient was too tired to get out of car.  [name] [name] from [facility] and gave [facility] shrinkers and told them [facility].  [name] [name], nurse from [facility] called [facility] to confirm what [facility] neede made [facility]  She was told that [name] [name] was out of the office today and they would confirm tomorrow, 12/14/15 and call her.  I called [name] [name] and explained the situation and patient will be called once we know what type of referral to make.

## 2015-12-16 ENCOUNTER — Encounter: Payer: Self-pay | Admitting: Vascular Surgery

## 2015-12-16 NOTE — Progress Notes (Signed)
Patient is a 50 year old male who returns for follow-up after recent right above-knee amputation. He reports no drainage from the incision. He denies any fever or chills. He has tried to completely refrain from tobacco use.  Physical exam: Filed Vitals:   12/12/15 1623  BP: 106/65  Pulse: 94  Temp: 100.5 F (38.1 C)  TempSrc: Oral  Resp: 18  SpO2: 96%    Right above-knee amputation well-healed all staples removed today  Assessment: Patient doing well status post right above-knee amputation  Plan: The patient will follow-up appointment in 1 year for continued surveillance of his carotid disease. He will have a carotid duplex exam at that point. His axillary femoral bypass was patent on exam today but if this does occlude hopefully his above-knee amputation will not be compromised. He will try to keep away from cigarettes.  The patient was given a prescription today for a shrinker and possible evaluation for prosthetic limb of the Lamb Doppler he will be able to use a prosthetic limb in his current condition.  Fabienne Brunsharles Jovante Hammitt, MD [facility] [place] Office: [phone] Pager: [phone]

## 2015-12-27 ENCOUNTER — Ambulatory Visit: Payer: Medicaid Other | Admitting: Physical Therapy

## 2016-01-09 ENCOUNTER — Ambulatory Visit: Payer: Medicaid Other | Attending: Vascular Surgery | Admitting: Physical Therapy

## 2016-01-09 ENCOUNTER — Encounter: Payer: Self-pay | Admitting: Physical Therapy

## 2016-01-09 VITALS — BP 123/70 | HR 95

## 2016-01-09 DIAGNOSIS — M25652 Stiffness of left hip, not elsewhere classified: Secondary | ICD-10-CM

## 2016-01-09 DIAGNOSIS — M25651 Stiffness of right hip, not elsewhere classified: Secondary | ICD-10-CM | POA: Insufficient documentation

## 2016-01-09 DIAGNOSIS — M6281 Muscle weakness (generalized): Secondary | ICD-10-CM | POA: Insufficient documentation

## 2016-01-09 DIAGNOSIS — R293 Abnormal posture: Secondary | ICD-10-CM | POA: Insufficient documentation

## 2016-01-10 ENCOUNTER — Telehealth: Payer: Self-pay | Admitting: Physical Therapy

## 2016-01-10 NOTE — Telephone Encounter (Signed)
[name] [name] presented to PT for evaluation for Bilateral Transfemoral Amputations prostheses. He does not appear appropriate for prostheses at this time. And with bilateral Transfemoral Amputations a single prosthesis is not indicated.   He needs a long sliding board and transfer belt for safety for car transfers. Can you please write a prescription and mail it to the patient so he can take it to [facility]? Also he is appropriate for power wheelchair but he denied it at this time due to current living condition. If he changes his mind about power wheelchair please refer him back to PT.  Thank you Vladimir Fasterobin Shanon Seawright, PT, DPT [facility] Phone:  [phone]  Fax:  [phone] [facility] [address] [place], [place] [id number]
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
## 2016-01-24 NOTE — Addendum Note (Signed)
Addended by: Yolonda Kida on: 01/24/2016 03:10 PM   Modules accepted: Orders

## 2016-02-05 IMAGING — CR DG CHEST 1V PORT
1 series · 1 of 1 positions shown · non-contrast
Comparison: 08/16/2014

CLINICAL DATA: Fever

EXAM:
PORTABLE CHEST - 1 VIEW

[AP]
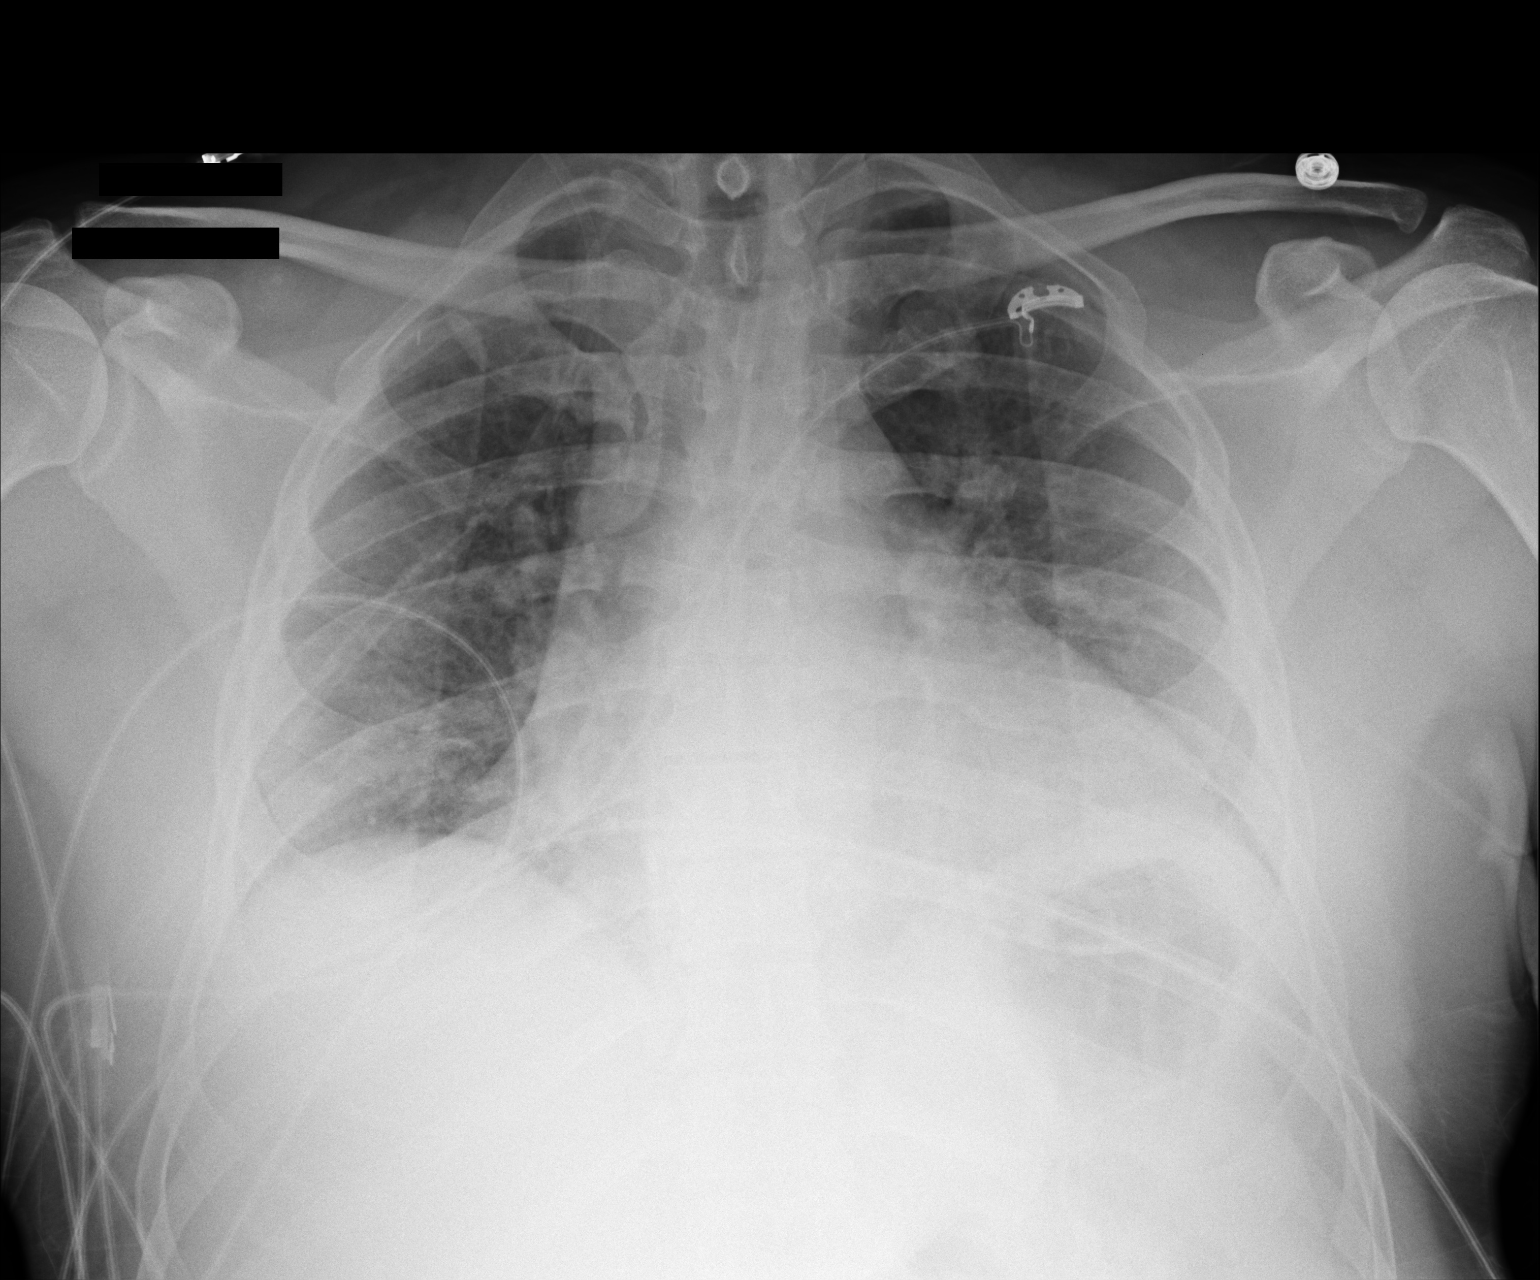

[1 of 1 positions shown; findings below may reference images not displayed]

FINDINGS: Borderline cardiomegaly. No pulmonary edema. There is hazy bilateral
basilar atelectasis or early infiltrate.
IMPRESSION: No pulmonary edema. Hazy bilateral basilar atelectasis or early
infiltrate. Borderline cardiomegaly.

## 2016-03-26 NOTE — Telephone Encounter (Signed)
Error JM 

## 2016-05-13 ENCOUNTER — Ambulatory Visit: Payer: Self-pay | Admitting: Vascular Surgery

## 2016-05-13 ENCOUNTER — Encounter (HOSPITAL_COMMUNITY): Payer: Self-pay

## 2016-06-04 ENCOUNTER — Encounter: Payer: Self-pay | Admitting: Vascular Surgery

## 2016-06-09 ENCOUNTER — Telehealth: Payer: Self-pay | Admitting: Vascular Surgery

## 2016-06-09 NOTE — Telephone Encounter (Signed)
According to [name] [name]'s documentation, the patient's insurance denied authorization for ultrasounds ordered by Dr. Arbie [name]. I notified the patient and asked if he would still like to come in to see Dr. Arbie [name] without the ultrasounds. The patient stated that if he couldn't get the ultrasounds, there wasn't a need to see the doctor.

## 2016-06-10 ENCOUNTER — Ambulatory Visit: Payer: Self-pay | Admitting: Vascular Surgery

## 2016-06-10 ENCOUNTER — Encounter (HOSPITAL_COMMUNITY): Payer: Self-pay

## 2016-08-26 ENCOUNTER — Encounter (HOSPITAL_COMMUNITY): Payer: Self-pay

## 2016-08-26 ENCOUNTER — Ambulatory Visit: Payer: Self-pay | Admitting: Vascular Surgery

## 2016-12-12 ENCOUNTER — Encounter: Payer: Self-pay | Admitting: Vascular Surgery

## 2016-12-18 ENCOUNTER — Encounter (HOSPITAL_COMMUNITY): Payer: Medicaid Other

## 2016-12-18 ENCOUNTER — Ambulatory Visit: Payer: Self-pay | Admitting: Vascular Surgery

## 2016-12-25 ENCOUNTER — Ambulatory Visit: Payer: Medicaid Other | Admitting: Vascular Surgery

## 2017-04-12 IMAGING — CT CT HEAD W/O CM
2 series · 15 of 30 positions shown, 19 images · non-contrast
Comparison: 08/13/2011

CLINICAL DATA: Acute mental status changes, hypertension,
peripheral vascular disease, remote left frontal infarct

EXAM:
CT HEAD WITHOUT CONTRAST
TECHNIQUE: Contiguous axial images were obtained from the base of the skull
through the vertex without contrast.

[Series 201: head w/o, idose (1) · axial · non-contrast · 0.49mm/px · z∈[+1118,+1263]mm · 13 of 35 slices shown, 17 images]
[im 3/35  brain]
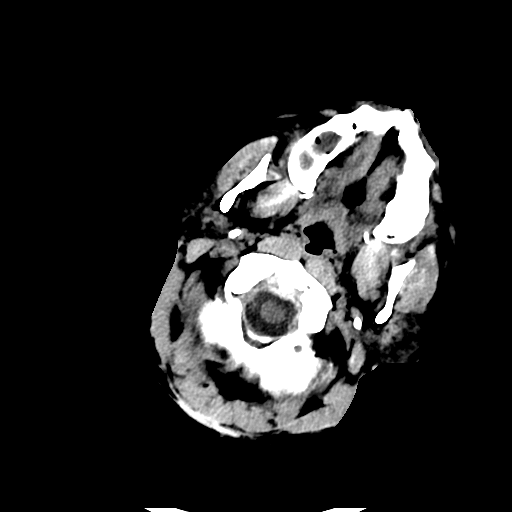
[im 3/35  bone]
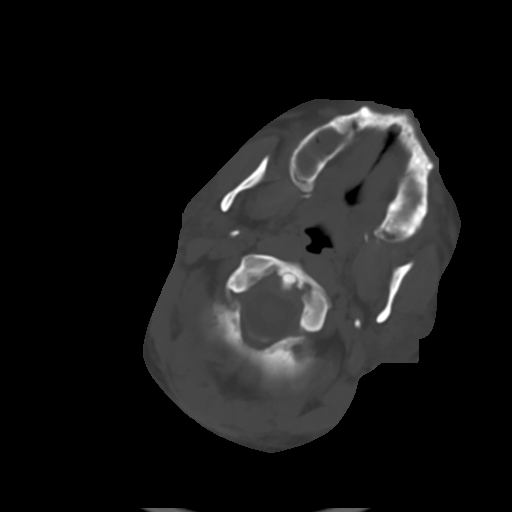
[im 5/35  brain]
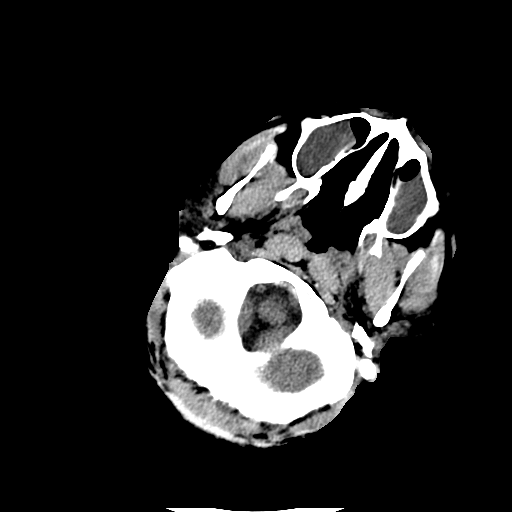
[im 8/35  brain]
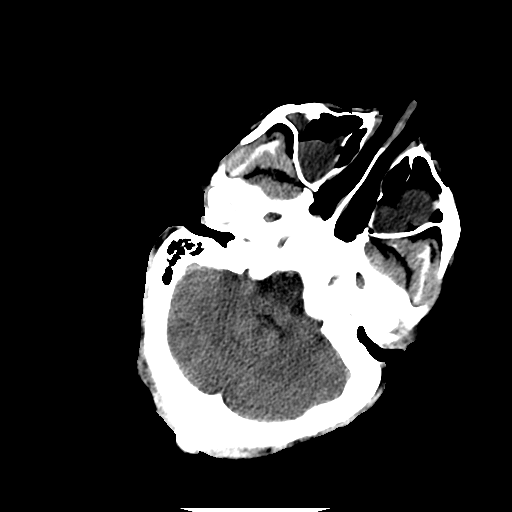
[im 10/35  brain]
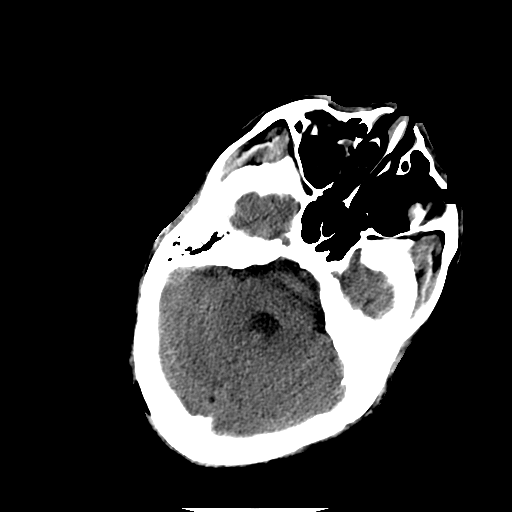
[im 13/35  brain]
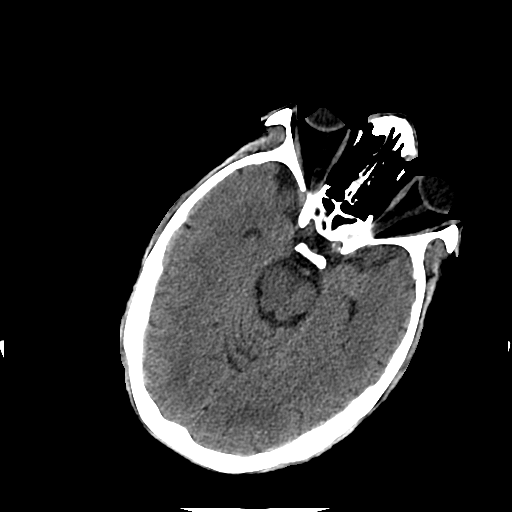
[im 13/35  bone]
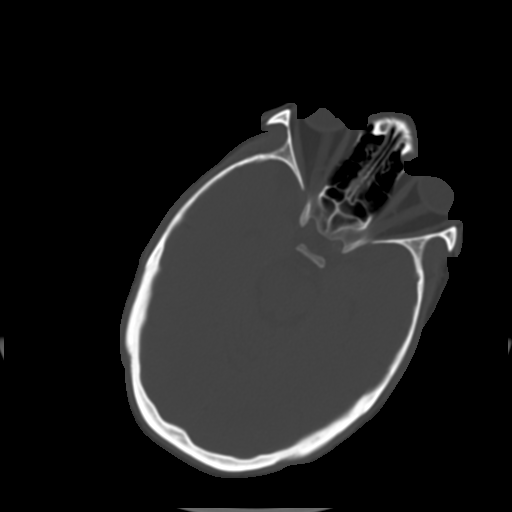
[im 15/35  brain]
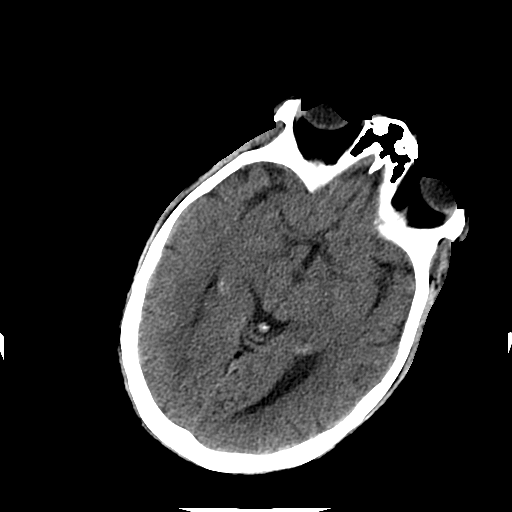
[im 18/35  brain]
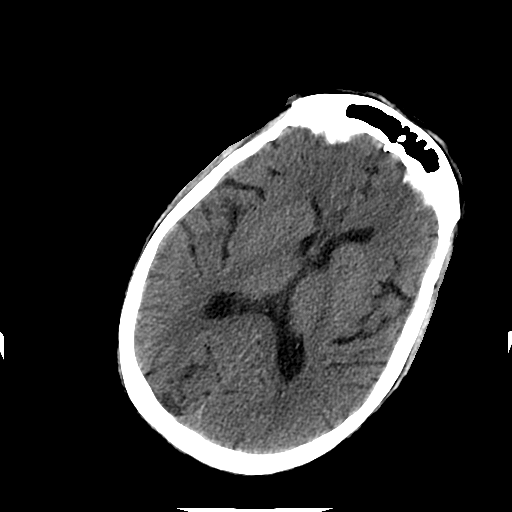
[im 20/35  brain]
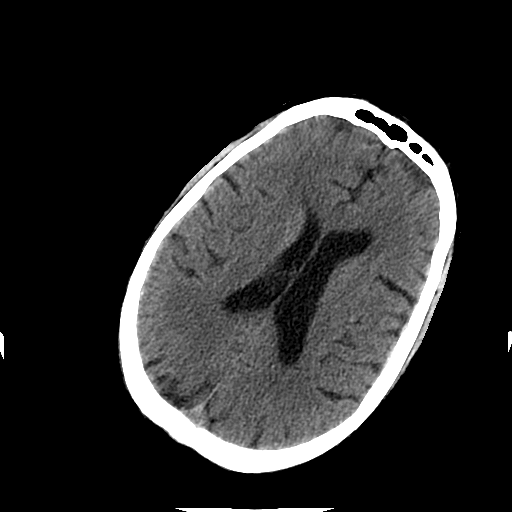
[im 22/35  brain]
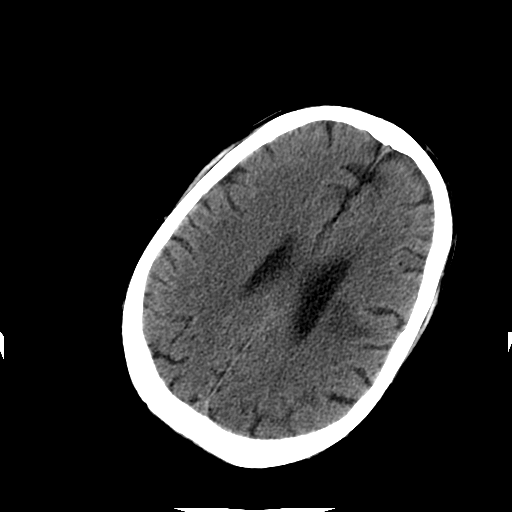
[im 22/35  bone]
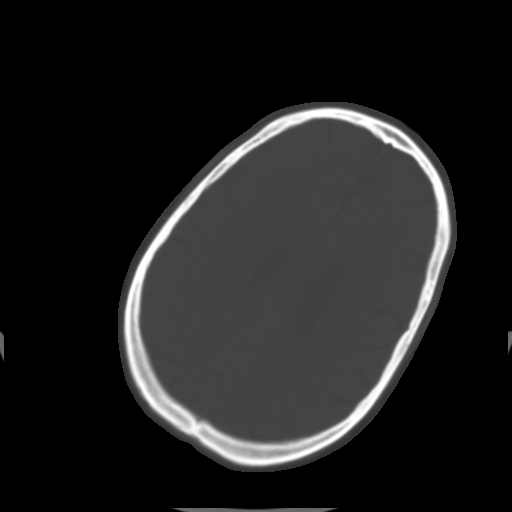
[im 25/35  brain]
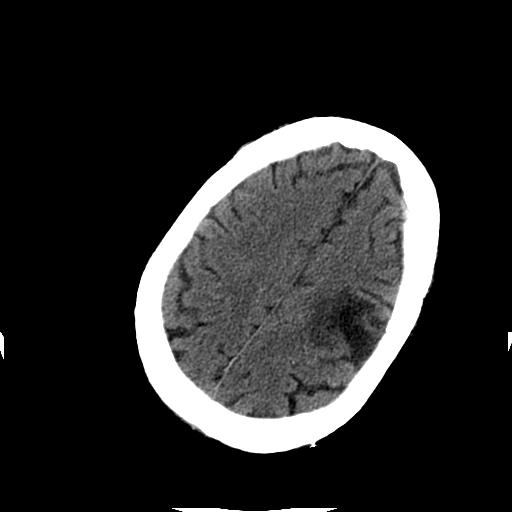
[im 27/35  brain]
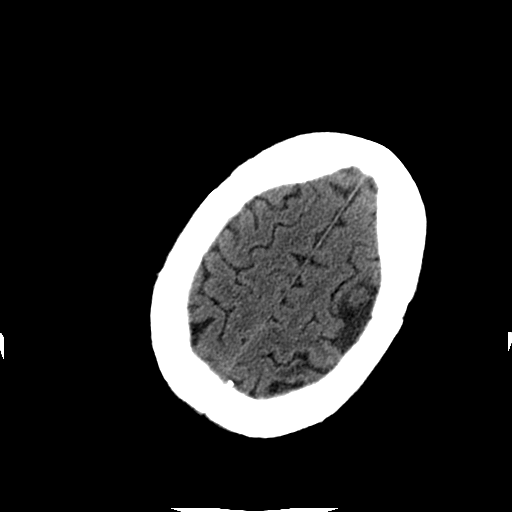
[im 30/35  brain]
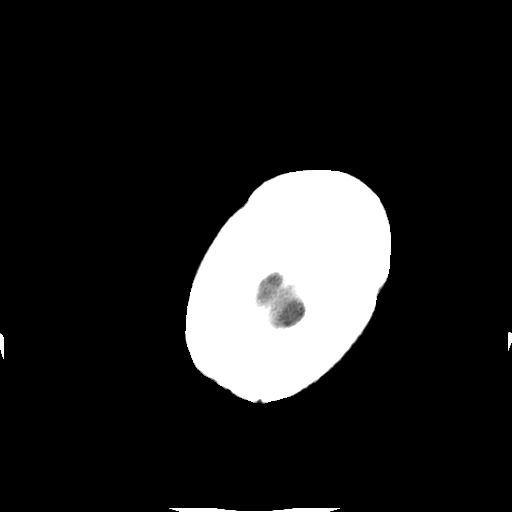
[im 32/35  brain]
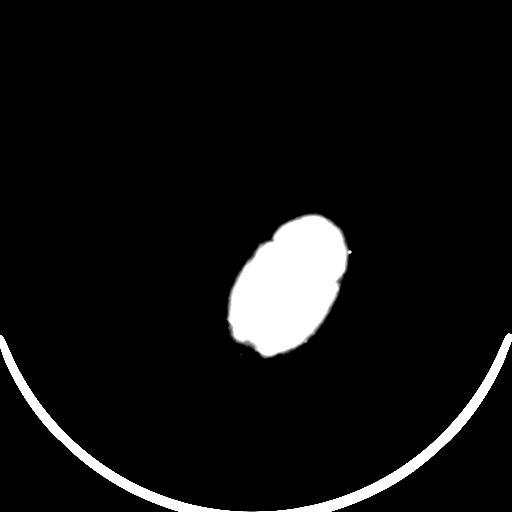
[im 32/35  bone]
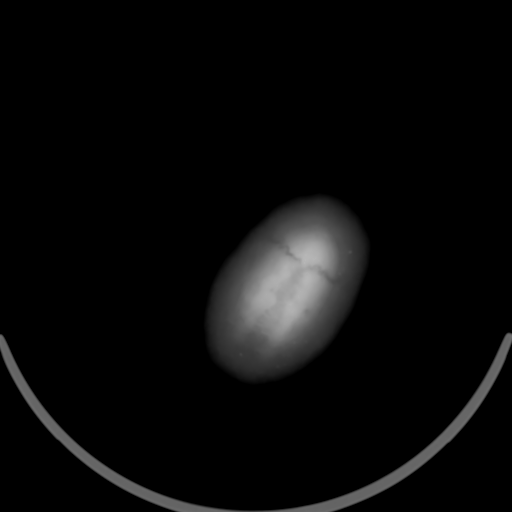

[Series 202: head w/o bone, idose (1) · axial · non-contrast · 0.49mm/px · z∈[+1118,+1143]mm · 2 of 35 slices shown]
[im 3/35  bone]
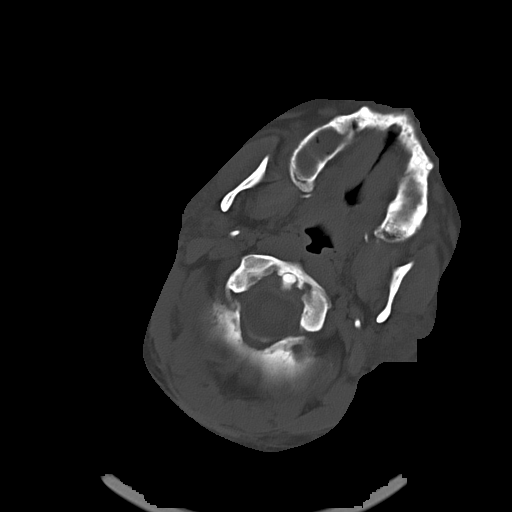
[im 8/35  bone]
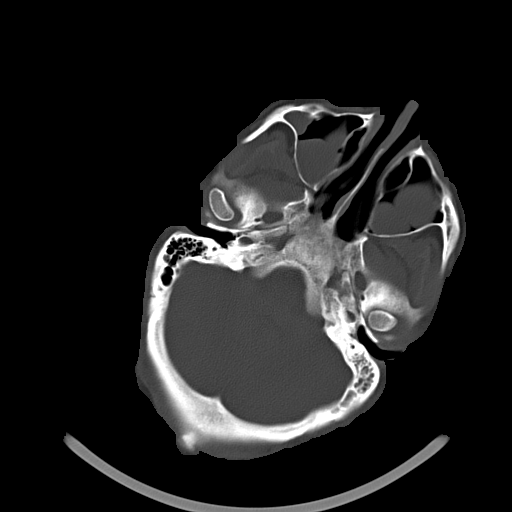

[15 of 30 positions shown; findings below may reference images not displayed]

FINDINGS: Encephalomalacia in the left high frontal lobe from a remote left
MCA territory infarct. No acute intracranial hemorrhage, mass
lesion, new infarction, midline shift, herniation, hydrocephalus, or
extra-axial fluid collection. Remote right occipital PCA territory
infarct also noted, image 19. Cisterns remain patent. No cerebellar
abnormality. Orbits appear symmetric. Polypoid mucosal thickening in
the maxillary sinuses bilaterally. Other sinuses remain clear. Left
mastoid effusion noted. Right mastoid is clear.
IMPRESSION: Areas of remote infarct in the left frontal and right occipital
lobes.

No acute intracranial finding by noncontrast CT

Polypoid bilateral maxillary sinus disease.

Left mastoid effusion versus mastoiditis.

## 2017-04-14 IMAGING — CR DG ABD PORTABLE 1V
2 series · 2 of 2 positions shown · non-contrast
Comparison: CT abdomen pelvis 11/21/2014.

CLINICAL DATA: Patient with severe abdominal pain, left lower
quadrant.

EXAM:
PORTABLE ABDOMEN - 1 VIEW

[AP (1 of 2)]
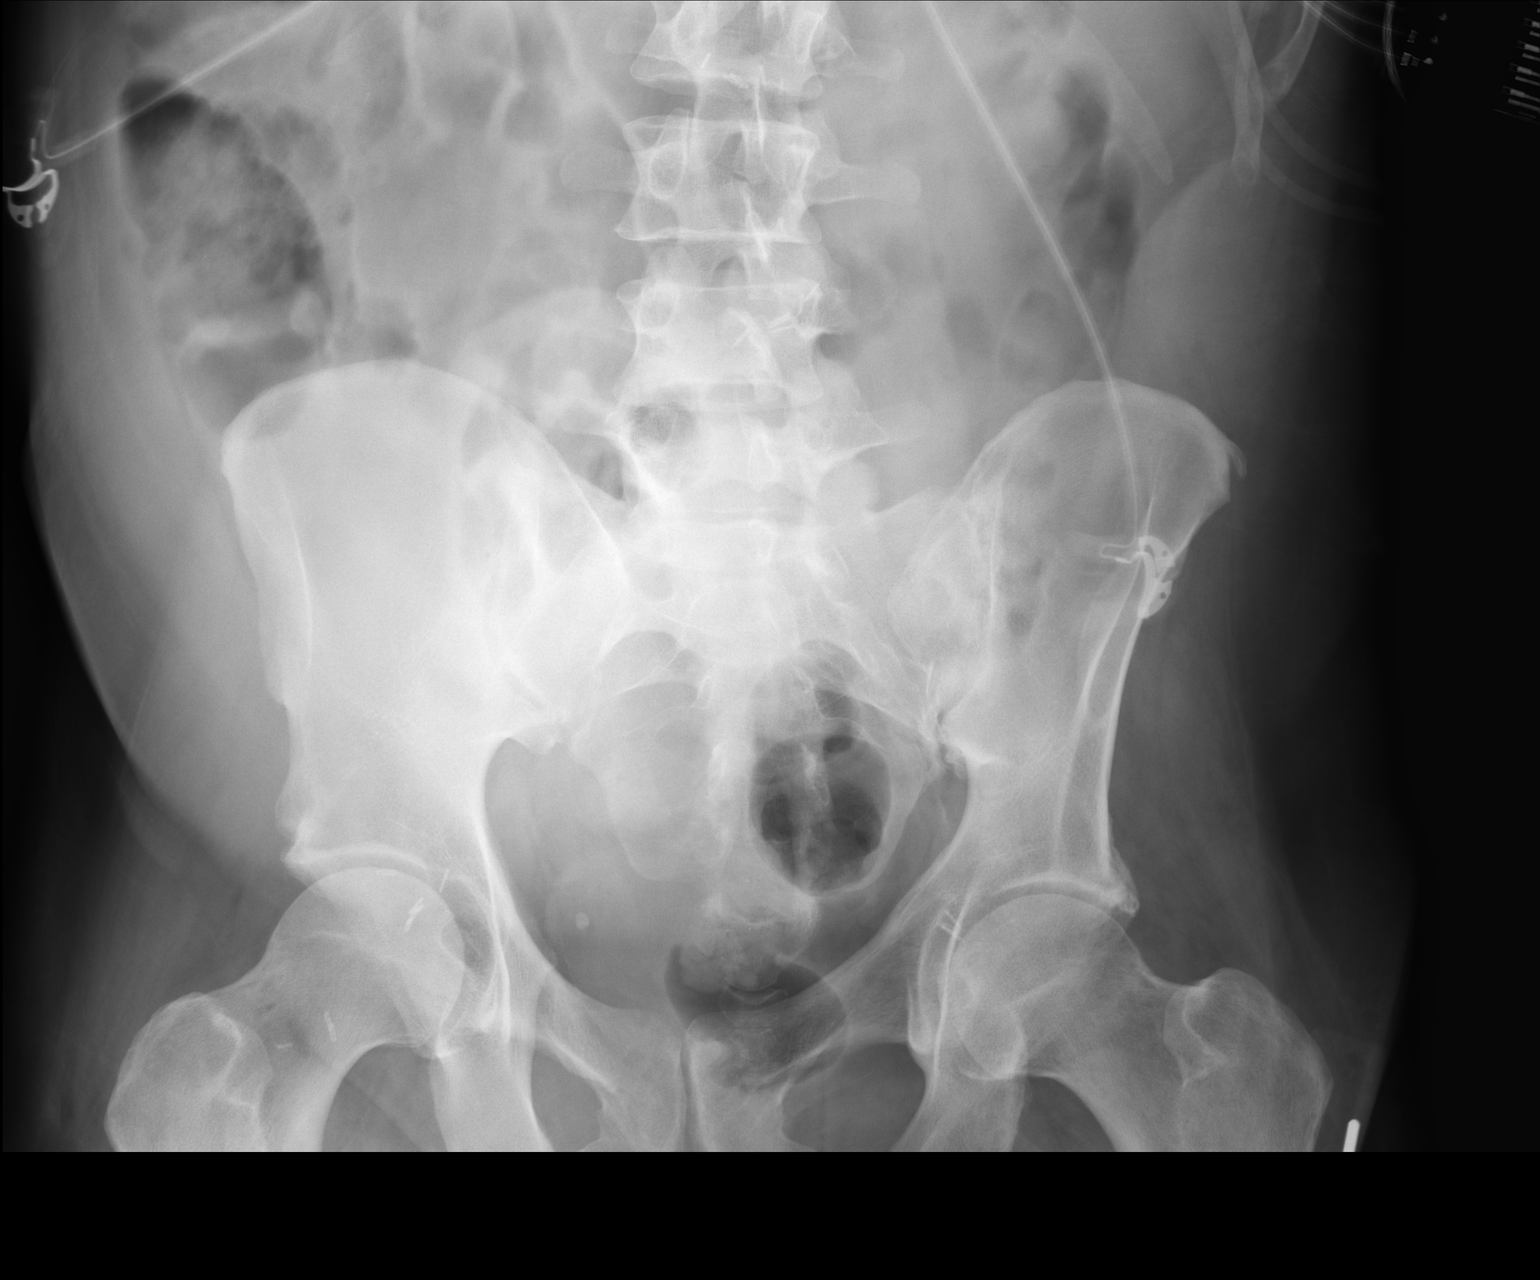

[AP (2 of 2)]
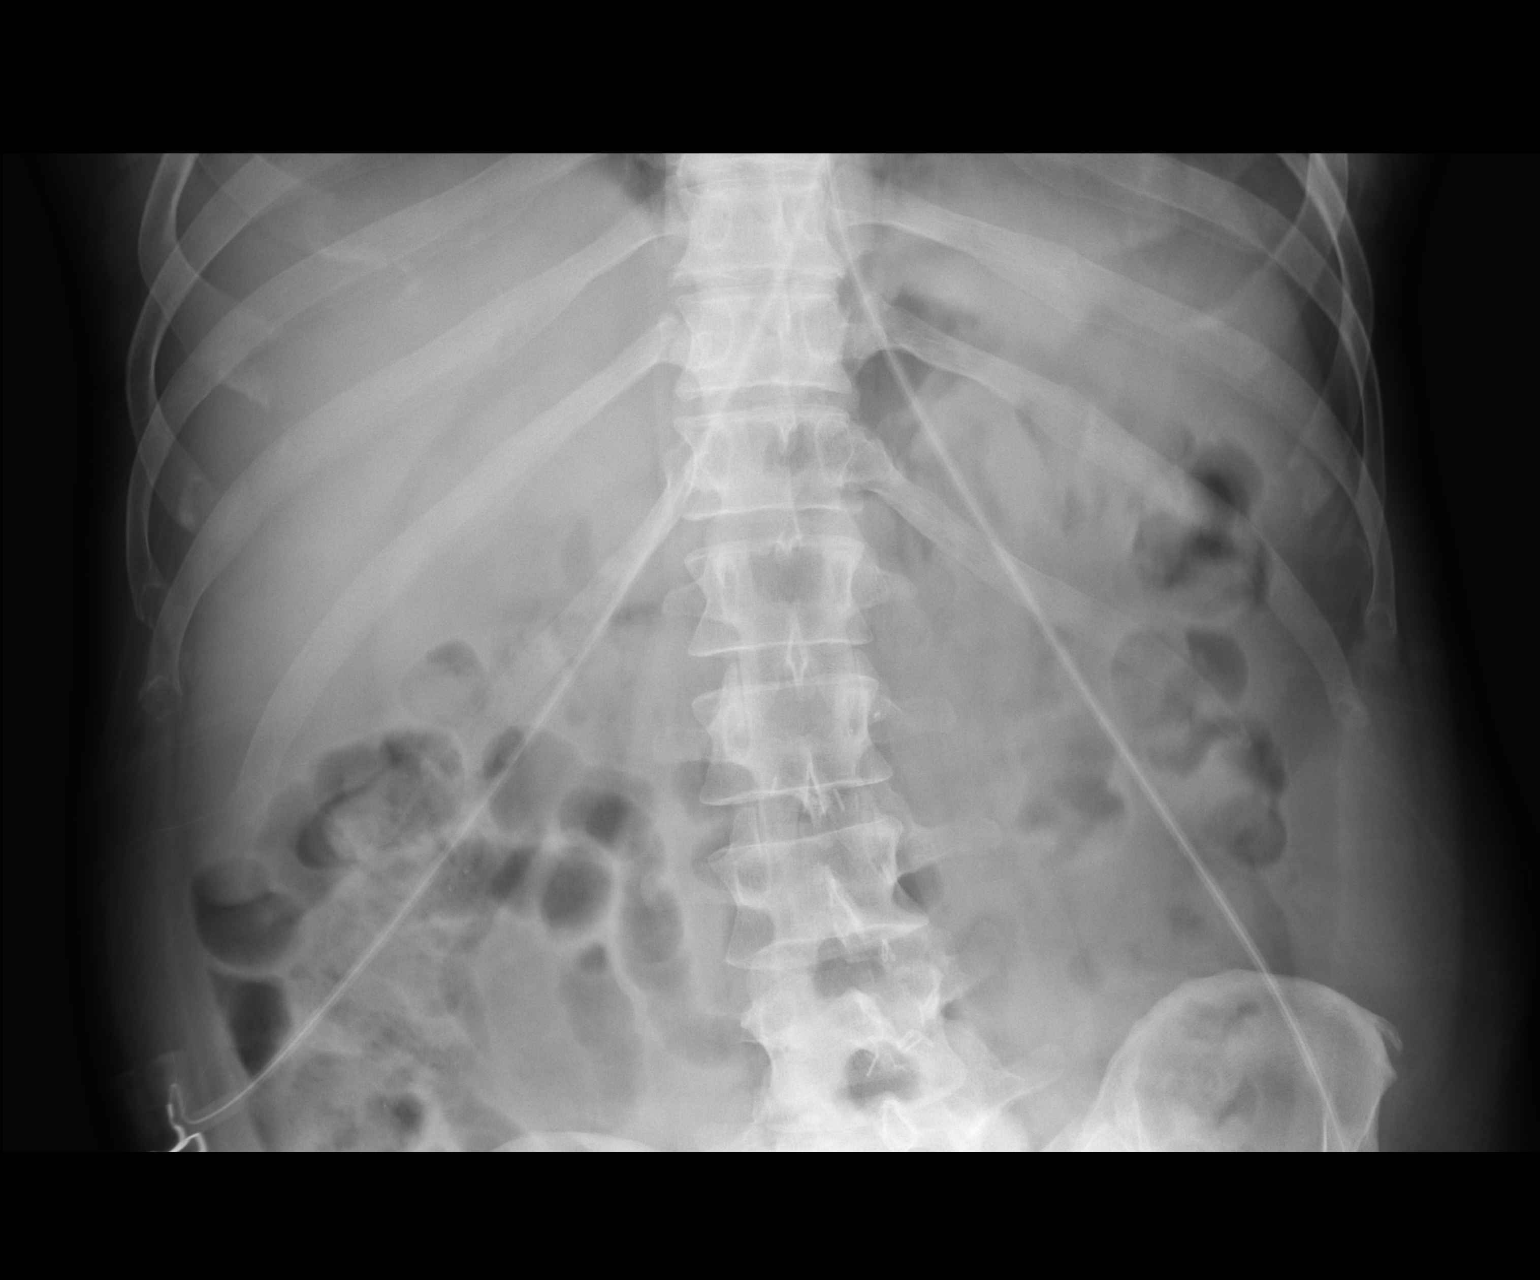

[2 of 2 positions shown; findings below may reference images not displayed]

FINDINGS: Lung bases are clear. Gas is demonstrated within nondilated loops of
large and small bowel in a nonobstructed pattern. Supine evaluation
limited for the detection of free intraperitoneal air. Unremarkable
osseous skeleton.
IMPRESSION: Nonobstructed bowel gas pattern.

## 2017-09-30 ENCOUNTER — Other Ambulatory Visit: Payer: Self-pay

## 2017-09-30 DIAGNOSIS — I739 Peripheral vascular disease, unspecified: Secondary | ICD-10-CM

## 2017-10-27 ENCOUNTER — Other Ambulatory Visit (HOSPITAL_COMMUNITY): Payer: Self-pay

## 2017-10-27 ENCOUNTER — Ambulatory Visit: Payer: Medicaid Other | Admitting: Vascular Surgery

## 2017-10-27 ENCOUNTER — Encounter (HOSPITAL_COMMUNITY): Payer: Self-pay

## 2017-11-26 ENCOUNTER — Telehealth: Payer: Self-pay

## 2017-11-26 ENCOUNTER — Encounter (HOSPITAL_COMMUNITY): Payer: Self-pay

## 2017-11-26 ENCOUNTER — Other Ambulatory Visit (HOSPITAL_COMMUNITY): Payer: Self-pay

## 2017-11-26 NOTE — Telephone Encounter (Signed)
Returned call to [place]. Patient was having some soreness in the top of his right thigh. No other symptoms. Skin normal color and warm to touch, no issues at the stump. Patient wanting to get scheduled for his yearly follow-up. Will get him scheduled.

## 2017-11-27 ENCOUNTER — Telehealth: Payer: Self-pay | Admitting: Vascular Surgery

## 2017-11-27 NOTE — Telephone Encounter (Signed)
sch app spk to wife 12/22/17 4pm f/u MD 01/12/18 ABI + LE Bypass as per stf msg

## 2017-11-27 NOTE — Telephone Encounter (Signed)
-----   Message from [name] HummerLisa Tucker, RN sent at 11/26/2017  1:26 PM EDT ----- Regarding: Appointment This patient called today and wants to reschedule his appointment with Dr. Arbie CookeyEarly. He had an appt in April for his yearly follow-up but cancelled/or no showed. He was supposed to have labs done today but those were cancelled on 5/2 by us. Can you please set him up with an appt to see Dr. Arbie CookeyEarly for f/u. I am not sure what needs to be done for his labs. They may have to wait...???? He has a new address: [address] [place], [place] [address]

## 2017-12-22 ENCOUNTER — Encounter: Payer: Self-pay | Admitting: Vascular Surgery

## 2017-12-22 ENCOUNTER — Ambulatory Visit (INDEPENDENT_AMBULATORY_CARE_PROVIDER_SITE_OTHER): Payer: Medicaid Other | Admitting: Vascular Surgery

## 2017-12-22 VITALS — BP 142/83 | HR 86 | Temp 97.0°F

## 2017-12-22 DIAGNOSIS — I739 Peripheral vascular disease, unspecified: Secondary | ICD-10-CM | POA: Diagnosis not present

## 2017-12-22 DIAGNOSIS — Z720 Tobacco use: Secondary | ICD-10-CM

## 2017-12-22 DIAGNOSIS — Z89612 Acquired absence of left leg above knee: Secondary | ICD-10-CM

## 2017-12-22 NOTE — Progress Notes (Signed)
Established Previous Bypass   History of Present Illness   [name] [name] is a 52 y.o. (10-24-1965) male who presents with concern for R ax-fem bypass patency.  Patient states about 2 weeks ago he felt a pulsation in his right axillary to femoral artery graft felt slower and eventually was no longer palpable.  He presents with his wife today with concern that this graft is now occluded.  He however denies any pain or active tissue ischemia of bilateral above-the-knee amputation sites.  Surgical history significant for 2 prior thrombectomies of axillary to femoral bypass.  He continues cigarette smoking daily.  The wife states his blood glucose is more controlled and now averages between 200 and 300.  The patient does not wear any prosthetic limbs due to his uncontrolled diabetes mellitus.  He continues to take aspirin 81 mg daily.  Current Outpatient Medications  Medication Sig Dispense Refill  . ALPRAZolam (XANAX) 1 MG tablet TAKE 1 TABLET BY MOUTH EVERY 8 HOURS AS NEEDED ANXIETY  0  . amLODipine (NORVASC) 10 MG tablet TAKE 1 TABLET BY MOUTH FOR HTN  2  . aspirin EC 81 MG EC tablet Take 1 tablet (81 mg total) by mouth daily.    Marland Kitchen atenolol (TENORMIN) 50 MG tablet Take 1 tablet (50 mg total) by mouth 2 (two) times daily.    Marland Kitchen atorvastatin (LIPITOR) 40 MG tablet Take 40 mg by mouth every morning.    Marland Kitchen buPROPion (WELLBUTRIN) 100 MG tablet Take 1 tablet (100 mg total) by mouth 2 (two) times daily.    Marland Kitchen gabapentin (NEURONTIN) 300 MG capsule Take 1 capsule (300 mg total) by mouth 3 (three) times daily. (Patient taking differently: Take 600 mg by mouth 3 (three) times daily. ) 90 capsule 1  . hydrochlorothiazide (HYDRODIURIL) 50 MG tablet Take 0.5 tablets (25 mg total) by mouth daily. (Patient taking differently: Take 50 mg by mouth daily. )    . insulin glargine (LANTUS) 100 UNIT/ML injection Inject 0.1 mLs (10 Units total) into the skin at bedtime. 10 mL 11  . lisinopril (PRINIVIL,ZESTRIL) 20 MG  tablet Take 20 mg by mouth 2 (two) times daily.    . metFORMIN (GLUCOPHAGE) 850 MG tablet Take 850 mg by mouth 3 (three) times daily.    . Multiple Vitamin (MULTIVITAMIN WITH MINERALS) TABS tablet Take 1 tablet by mouth daily.    Marland Kitchen omeprazole (PRILOSEC) 20 MG capsule Take 20 mg by mouth 2 (two) times daily before a meal.     . oxyCODONE (OXY IR/ROXICODONE) 5 MG immediate release tablet Take 1 tablet (5 mg total) by mouth every 6 (six) hours as needed for severe pain or breakthrough pain. 30 tablet 0  . potassium chloride (K-DUR) 10 MEQ tablet Take 10 mEq by mouth daily.    . rivaroxaban (XARELTO) 20 MG TABS tablet Take 1 tablet (20 mg total) by mouth daily with supper. Please start after you finish your xarelto starter pack. 30 tablet 6  . SEROQUEL XR 400 MG 24 hr tablet Take 800 mg by mouth at bedtime.  0  . Amino Acids-Protein Hydrolys (FEEDING SUPPLEMENT, PRO-STAT SUGAR FREE 64,) LIQD Take 30 mLs by mouth daily. (Patient not taking: Reported on 01/09/2016) 900 mL 0  . cloNIDine (CATAPRES - DOSED IN MG/24 HR) 0.1 mg/24hr patch Place 1 patch (0.1 mg total) onto the skin once a week. (Patient not taking: Reported on 12/22/2017) 4 patch 10  . docusate (COLACE) 50 MG/5ML liquid Take 5 mLs (50 mg  total) by mouth daily. (Patient not taking: Reported on 12/22/2017) 100 mL 0  . glipiZIDE (GLUCOTROL) 10 MG tablet Take 10 mg by mouth daily.  5  . insulin aspart protamine- aspart (NOVOLOG MIX 70/30) (70-30) 100 UNIT/ML injection Inject 0-14 Units into the skin See admin instructions. 150-199=4 units 200-249=6 units 250-299=8 units 300-349=12 units 350-400=14 units    . nicotine (NICODERM CQ - DOSED IN MG/24 HOURS) 14 mg/24hr patch Place 1 patch (14 mg total) onto the skin daily. (Patient not taking: Reported on 12/22/2017) 28 patch 0   No current facility-administered medications for this visit.     On ROS today: 10 system ROS is negative unless otherwise noted in HPI.   Physical Examination   Vitals:     12/22/17 1603  BP: (!) 142/83  Pulse: 86  Temp: (!) 97 F (36.1 C)  TempSrc: Oral  SpO2: 97%   There is no height or weight on file to calculate BMI.  General Alert, O x 3, WD, NAD  Pulmonary Sym exp, good B air movt,   Cardiac RRR, Nl S1, S2,   Vascular Vessel Right Left  Radial Palpable Palpable  Brachial Palpable Palpable  Carotid Palpable, No Bruit Palpable, No Bruit  Aorta Not palpable N/A    Gastro- intestinal soft, non-distended, non-tender to palpation, incisional hernia midline below umbilicus  Musculo- skeletal M/S 5/5 throughout  , Extremities without ischemic changes  , No edema present, no active tissue ischemia B AKA sites; no doppler signal R ax-fem bypass  Neurologic A&O    Medical Decision Making   [name] LauthRobert [name] is a 52 y.o. male who presents with an occluded R axillary to femoral artery bypass graft   Dr. Arbie CookeyEarly was involved in evaluation and management plan of this patient  Occluded axillary to femoral bypass graft however patient denies any rest pain or active tissue ischemia of bilateral AKA stump sites  No indication for revascularization of bypass graft currently  Encouraged smoking cessation  Encouraged follow-up with PCP for management of insulin-dependent diabetes mellitus  We will cancel graft surveillance duplex in July and patient may follow-up on an as-needed basis   Emilie RutterMatthew Zykerria Tanton PA-C [facility] Office: [phone]

## 2018-01-12 ENCOUNTER — Encounter (HOSPITAL_COMMUNITY): Payer: Self-pay

## 2018-01-12 ENCOUNTER — Other Ambulatory Visit (HOSPITAL_COMMUNITY): Payer: Self-pay

## 2019-01-19 ENCOUNTER — Telehealth: Payer: Self-pay | Admitting: *Deleted

## 2019-01-19 NOTE — Telephone Encounter (Signed)
Spoke to Dominica at [facility] regarding their need for clearance to continue or stop Xarelto. Lorriane Shire is to fax this office a clearance for to have signed by [facility] provider. Message left for Mr. Nieman regarding all of the above.
